# Patient Record
Sex: Female | Born: 1935
Health system: Southern US, Community
[De-identification: ages and names within clinical notes are randomized; demographics above are authoritative.]

## PROBLEM LIST (undated history)

## (undated) DIAGNOSIS — E039 Hypothyroidism, unspecified: Secondary | ICD-10-CM

## (undated) DIAGNOSIS — M810 Age-related osteoporosis without current pathological fracture: Secondary | ICD-10-CM

## (undated) DIAGNOSIS — G250 Essential tremor: Secondary | ICD-10-CM

## (undated) DIAGNOSIS — F0281 Dementia in other diseases classified elsewhere with behavioral disturbance: Secondary | ICD-10-CM

## (undated) DIAGNOSIS — I10 Essential (primary) hypertension: Secondary | ICD-10-CM

## (undated) DIAGNOSIS — F32A Depression, unspecified: Secondary | ICD-10-CM

## (undated) DIAGNOSIS — F22 Delusional disorders: Secondary | ICD-10-CM

## (undated) DIAGNOSIS — I272 Pulmonary hypertension, unspecified: Secondary | ICD-10-CM

## (undated) DIAGNOSIS — H353130 Nonexudative age-related macular degeneration, bilateral, stage unspecified: Secondary | ICD-10-CM

## (undated) DIAGNOSIS — E7800 Pure hypercholesterolemia, unspecified: Secondary | ICD-10-CM

## (undated) DIAGNOSIS — F423 Hoarding disorder: Secondary | ICD-10-CM

## (undated) DIAGNOSIS — F329 Major depressive disorder, single episode, unspecified: Secondary | ICD-10-CM

## (undated) DIAGNOSIS — M5136 Other intervertebral disc degeneration, lumbar region: Secondary | ICD-10-CM

## (undated) DIAGNOSIS — I447 Left bundle-branch block, unspecified: Secondary | ICD-10-CM

## (undated) DIAGNOSIS — K219 Gastro-esophageal reflux disease without esophagitis: Secondary | ICD-10-CM

## (undated) DIAGNOSIS — G4700 Insomnia, unspecified: Secondary | ICD-10-CM

## (undated) DIAGNOSIS — K7589 Other specified inflammatory liver diseases: Secondary | ICD-10-CM

## (undated) DIAGNOSIS — M5430 Sciatica, unspecified side: Secondary | ICD-10-CM

## (undated) DIAGNOSIS — R413 Other amnesia: Secondary | ICD-10-CM

## (undated) DIAGNOSIS — F5104 Psychophysiologic insomnia: Secondary | ICD-10-CM

## (undated) DIAGNOSIS — R5383 Other fatigue: Secondary | ICD-10-CM

## (undated) DIAGNOSIS — H04123 Dry eye syndrome of bilateral lacrimal glands: Secondary | ICD-10-CM

## (undated) DIAGNOSIS — E785 Hyperlipidemia, unspecified: Secondary | ICD-10-CM

## (undated) HISTORY — DX: Major depressive disorder, single episode, unspecified: F32.9

## (undated) HISTORY — DX: Essential (primary) hypertension: I10

## (undated) HISTORY — PX: TONSILLECTOMY: SUR1361

## (undated) HISTORY — DX: Depression, unspecified: F32.A

## (undated) HISTORY — DX: Sciatica, unspecified side: M54.30

## (undated) HISTORY — DX: Age-related osteoporosis without current pathological fracture: M81.0

## (undated) HISTORY — PX: TOTAL ABDOMINAL HYSTERECTOMY W/ BILATERAL SALPINGOOPHORECTOMY: SHX83

## (undated) HISTORY — DX: Gastro-esophageal reflux disease without esophagitis: K21.9

## (undated) HISTORY — PX: FACIAL COSMETIC SURGERY: SHX629

## (undated) HISTORY — DX: Other specified inflammatory liver diseases: K75.89

## (undated) HISTORY — DX: Other amnesia: R41.3

## (undated) HISTORY — DX: Other fatigue: R53.83

## (undated) HISTORY — PX: APPENDECTOMY: SHX54

## (undated) HISTORY — DX: Hyperlipidemia, unspecified: E78.5

## (undated) HISTORY — DX: Insomnia, unspecified: G47.00

## (undated) HISTORY — PX: CARDIAC CATHETERIZATION: SHX172

---
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
## 2011-07-29 DIAGNOSIS — E7800 Pure hypercholesterolemia, unspecified: Secondary | ICD-10-CM | POA: Insufficient documentation

## 2011-07-29 DIAGNOSIS — K219 Gastro-esophageal reflux disease without esophagitis: Secondary | ICD-10-CM | POA: Insufficient documentation

## 2011-07-29 DIAGNOSIS — I447 Left bundle-branch block, unspecified: Secondary | ICD-10-CM | POA: Insufficient documentation

## 2011-07-29 DIAGNOSIS — I10 Essential (primary) hypertension: Secondary | ICD-10-CM

## 2011-07-29 DIAGNOSIS — E039 Hypothyroidism, unspecified: Secondary | ICD-10-CM

## 2011-07-29 HISTORY — DX: Essential (primary) hypertension: I10

## 2011-07-29 HISTORY — DX: Gastro-esophageal reflux disease without esophagitis: K21.9

## 2011-07-29 HISTORY — DX: Left bundle-branch block, unspecified: I44.7

## 2011-07-29 HISTORY — DX: Hypothyroidism, unspecified: E03.9

## 2011-07-29 HISTORY — DX: Pure hypercholesterolemia, unspecified: E78.00

## 2013-06-21 LAB — HM DEXA SCAN

## 2016-07-29 DIAGNOSIS — F329 Major depressive disorder, single episode, unspecified: Secondary | ICD-10-CM | POA: Insufficient documentation

## 2016-07-29 HISTORY — DX: Major depressive disorder, single episode, unspecified: F32.9

## 2016-12-08 DIAGNOSIS — H353130 Nonexudative age-related macular degeneration, bilateral, stage unspecified: Secondary | ICD-10-CM | POA: Diagnosis not present

## 2016-12-08 DIAGNOSIS — Z9889 Other specified postprocedural states: Secondary | ICD-10-CM | POA: Diagnosis not present

## 2016-12-08 DIAGNOSIS — Z961 Presence of intraocular lens: Secondary | ICD-10-CM | POA: Diagnosis not present

## 2016-12-08 DIAGNOSIS — H35363 Drusen (degenerative) of macula, bilateral: Secondary | ICD-10-CM | POA: Diagnosis not present

## 2016-12-09 DIAGNOSIS — F321 Major depressive disorder, single episode, moderate: Secondary | ICD-10-CM | POA: Diagnosis not present

## 2016-12-09 DIAGNOSIS — R413 Other amnesia: Secondary | ICD-10-CM

## 2016-12-09 DIAGNOSIS — A09 Infectious gastroenteritis and colitis, unspecified: Secondary | ICD-10-CM | POA: Diagnosis not present

## 2016-12-09 DIAGNOSIS — Z79899 Other long term (current) drug therapy: Secondary | ICD-10-CM | POA: Diagnosis not present

## 2016-12-09 DIAGNOSIS — E039 Hypothyroidism, unspecified: Secondary | ICD-10-CM | POA: Diagnosis not present

## 2016-12-09 DIAGNOSIS — I10 Essential (primary) hypertension: Secondary | ICD-10-CM | POA: Diagnosis not present

## 2016-12-09 DIAGNOSIS — K210 Gastro-esophageal reflux disease with esophagitis: Secondary | ICD-10-CM | POA: Diagnosis not present

## 2016-12-09 HISTORY — DX: Other amnesia: R41.3

## 2017-01-06 DIAGNOSIS — I10 Essential (primary) hypertension: Secondary | ICD-10-CM | POA: Diagnosis not present

## 2017-01-06 DIAGNOSIS — K210 Gastro-esophageal reflux disease with esophagitis: Secondary | ICD-10-CM | POA: Diagnosis not present

## 2017-01-06 DIAGNOSIS — R413 Other amnesia: Secondary | ICD-10-CM | POA: Diagnosis not present

## 2017-01-06 DIAGNOSIS — Z79899 Other long term (current) drug therapy: Secondary | ICD-10-CM | POA: Diagnosis not present

## 2017-01-06 DIAGNOSIS — F321 Major depressive disorder, single episode, moderate: Secondary | ICD-10-CM | POA: Diagnosis not present

## 2017-01-06 DIAGNOSIS — E7800 Pure hypercholesterolemia, unspecified: Secondary | ICD-10-CM | POA: Diagnosis not present

## 2017-01-13 DIAGNOSIS — M2041 Other hammer toe(s) (acquired), right foot: Secondary | ICD-10-CM | POA: Diagnosis not present

## 2017-01-13 DIAGNOSIS — M2042 Other hammer toe(s) (acquired), left foot: Secondary | ICD-10-CM | POA: Diagnosis not present

## 2017-02-05 DIAGNOSIS — R001 Bradycardia, unspecified: Secondary | ICD-10-CM | POA: Diagnosis not present

## 2017-02-05 DIAGNOSIS — K219 Gastro-esophageal reflux disease without esophagitis: Secondary | ICD-10-CM | POA: Diagnosis not present

## 2017-02-05 DIAGNOSIS — R918 Other nonspecific abnormal finding of lung field: Secondary | ICD-10-CM | POA: Diagnosis not present

## 2017-02-05 DIAGNOSIS — I10 Essential (primary) hypertension: Secondary | ICD-10-CM | POA: Diagnosis not present

## 2017-02-05 DIAGNOSIS — I447 Left bundle-branch block, unspecified: Secondary | ICD-10-CM | POA: Diagnosis not present

## 2017-02-05 DIAGNOSIS — R079 Chest pain, unspecified: Secondary | ICD-10-CM | POA: Diagnosis not present

## 2017-02-05 DIAGNOSIS — E7800 Pure hypercholesterolemia, unspecified: Secondary | ICD-10-CM | POA: Diagnosis not present

## 2017-02-05 DIAGNOSIS — R0789 Other chest pain: Secondary | ICD-10-CM | POA: Diagnosis not present

## 2017-02-05 DIAGNOSIS — Z87891 Personal history of nicotine dependence: Secondary | ICD-10-CM | POA: Diagnosis not present

## 2017-02-09 DIAGNOSIS — R109 Unspecified abdominal pain: Secondary | ICD-10-CM | POA: Diagnosis not present

## 2017-02-09 DIAGNOSIS — R079 Chest pain, unspecified: Secondary | ICD-10-CM | POA: Diagnosis not present

## 2017-02-09 DIAGNOSIS — R142 Eructation: Secondary | ICD-10-CM | POA: Diagnosis not present

## 2017-03-08 DIAGNOSIS — R251 Tremor, unspecified: Secondary | ICD-10-CM | POA: Diagnosis not present

## 2017-03-08 DIAGNOSIS — N29 Other disorders of kidney and ureter in diseases classified elsewhere: Secondary | ICD-10-CM | POA: Diagnosis not present

## 2017-03-08 DIAGNOSIS — E785 Hyperlipidemia, unspecified: Secondary | ICD-10-CM | POA: Diagnosis not present

## 2017-03-08 DIAGNOSIS — I10 Essential (primary) hypertension: Secondary | ICD-10-CM | POA: Diagnosis not present

## 2017-03-08 DIAGNOSIS — R5381 Other malaise: Secondary | ICD-10-CM | POA: Diagnosis not present

## 2017-03-08 DIAGNOSIS — D519 Vitamin B12 deficiency anemia, unspecified: Secondary | ICD-10-CM | POA: Diagnosis not present

## 2017-04-14 DIAGNOSIS — M2041 Other hammer toe(s) (acquired), right foot: Secondary | ICD-10-CM | POA: Diagnosis not present

## 2017-04-14 DIAGNOSIS — M2042 Other hammer toe(s) (acquired), left foot: Secondary | ICD-10-CM | POA: Diagnosis not present

## 2017-05-04 DIAGNOSIS — R413 Other amnesia: Secondary | ICD-10-CM | POA: Diagnosis not present

## 2017-05-04 DIAGNOSIS — G250 Essential tremor: Secondary | ICD-10-CM | POA: Diagnosis not present

## 2017-05-04 DIAGNOSIS — G629 Polyneuropathy, unspecified: Secondary | ICD-10-CM | POA: Diagnosis not present

## 2017-05-04 DIAGNOSIS — R269 Unspecified abnormalities of gait and mobility: Secondary | ICD-10-CM | POA: Diagnosis not present

## 2017-06-10 DIAGNOSIS — K219 Gastro-esophageal reflux disease without esophagitis: Secondary | ICD-10-CM | POA: Diagnosis not present

## 2017-06-10 DIAGNOSIS — Z7982 Long term (current) use of aspirin: Secondary | ICD-10-CM | POA: Diagnosis not present

## 2017-06-10 DIAGNOSIS — I10 Essential (primary) hypertension: Secondary | ICD-10-CM | POA: Diagnosis not present

## 2017-06-10 DIAGNOSIS — Z79899 Other long term (current) drug therapy: Secondary | ICD-10-CM | POA: Diagnosis not present

## 2017-06-10 DIAGNOSIS — E785 Hyperlipidemia, unspecified: Secondary | ICD-10-CM | POA: Diagnosis not present

## 2017-06-10 DIAGNOSIS — R040 Epistaxis: Secondary | ICD-10-CM | POA: Diagnosis not present

## 2017-06-14 DIAGNOSIS — R413 Other amnesia: Secondary | ICD-10-CM | POA: Diagnosis not present

## 2017-06-14 DIAGNOSIS — R040 Epistaxis: Secondary | ICD-10-CM | POA: Diagnosis not present

## 2017-07-04 DIAGNOSIS — R413 Other amnesia: Secondary | ICD-10-CM | POA: Diagnosis not present

## 2017-07-04 DIAGNOSIS — R040 Epistaxis: Secondary | ICD-10-CM | POA: Diagnosis not present

## 2017-07-19 DIAGNOSIS — Z23 Encounter for immunization: Secondary | ICD-10-CM | POA: Diagnosis not present

## 2017-07-19 DIAGNOSIS — R413 Other amnesia: Secondary | ICD-10-CM | POA: Diagnosis not present

## 2017-07-19 DIAGNOSIS — K210 Gastro-esophageal reflux disease with esophagitis: Secondary | ICD-10-CM | POA: Diagnosis not present

## 2017-07-19 DIAGNOSIS — I10 Essential (primary) hypertension: Secondary | ICD-10-CM | POA: Diagnosis not present

## 2017-07-19 DIAGNOSIS — E039 Hypothyroidism, unspecified: Secondary | ICD-10-CM | POA: Diagnosis not present

## 2017-07-19 DIAGNOSIS — Z79899 Other long term (current) drug therapy: Secondary | ICD-10-CM | POA: Diagnosis not present

## 2017-07-25 DIAGNOSIS — R040 Epistaxis: Secondary | ICD-10-CM | POA: Diagnosis not present

## 2017-07-25 DIAGNOSIS — J3481 Nasal mucositis (ulcerative): Secondary | ICD-10-CM | POA: Diagnosis not present

## 2017-07-25 DIAGNOSIS — R413 Other amnesia: Secondary | ICD-10-CM | POA: Diagnosis not present

## 2017-07-25 DIAGNOSIS — H903 Sensorineural hearing loss, bilateral: Secondary | ICD-10-CM | POA: Diagnosis not present

## 2017-08-10 DIAGNOSIS — M2041 Other hammer toe(s) (acquired), right foot: Secondary | ICD-10-CM | POA: Diagnosis not present

## 2017-08-10 DIAGNOSIS — M2042 Other hammer toe(s) (acquired), left foot: Secondary | ICD-10-CM | POA: Diagnosis not present

## 2017-10-05 DIAGNOSIS — F320 Major depressive disorder, single episode, mild: Secondary | ICD-10-CM | POA: Diagnosis not present

## 2017-10-05 DIAGNOSIS — R413 Other amnesia: Secondary | ICD-10-CM | POA: Diagnosis not present

## 2017-10-05 DIAGNOSIS — K210 Gastro-esophageal reflux disease with esophagitis: Secondary | ICD-10-CM | POA: Diagnosis not present

## 2017-10-05 DIAGNOSIS — Z79899 Other long term (current) drug therapy: Secondary | ICD-10-CM | POA: Diagnosis not present

## 2018-01-25 DIAGNOSIS — F320 Major depressive disorder, single episode, mild: Secondary | ICD-10-CM | POA: Diagnosis not present

## 2018-01-25 DIAGNOSIS — E7800 Pure hypercholesterolemia, unspecified: Secondary | ICD-10-CM | POA: Diagnosis not present

## 2018-01-25 DIAGNOSIS — Z79899 Other long term (current) drug therapy: Secondary | ICD-10-CM | POA: Diagnosis not present

## 2018-01-25 DIAGNOSIS — R413 Other amnesia: Secondary | ICD-10-CM | POA: Diagnosis not present

## 2018-01-25 DIAGNOSIS — K210 Gastro-esophageal reflux disease with esophagitis: Secondary | ICD-10-CM | POA: Diagnosis not present

## 2018-03-16 DIAGNOSIS — F320 Major depressive disorder, single episode, mild: Secondary | ICD-10-CM | POA: Diagnosis not present

## 2018-03-16 DIAGNOSIS — Z79899 Other long term (current) drug therapy: Secondary | ICD-10-CM | POA: Diagnosis not present

## 2018-03-16 DIAGNOSIS — I10 Essential (primary) hypertension: Secondary | ICD-10-CM | POA: Diagnosis not present

## 2018-03-16 DIAGNOSIS — R413 Other amnesia: Secondary | ICD-10-CM | POA: Diagnosis not present

## 2018-04-17 ENCOUNTER — Encounter: Payer: Self-pay | Admitting: Internal Medicine

## 2018-04-18 ENCOUNTER — Non-Acute Institutional Stay: Payer: Medicare Other | Admitting: Internal Medicine

## 2018-04-18 ENCOUNTER — Encounter: Payer: Self-pay | Admitting: Internal Medicine

## 2018-04-18 DIAGNOSIS — F423 Hoarding disorder: Secondary | ICD-10-CM

## 2018-04-18 DIAGNOSIS — I10 Essential (primary) hypertension: Secondary | ICD-10-CM | POA: Diagnosis not present

## 2018-04-18 DIAGNOSIS — G301 Alzheimer's disease with late onset: Secondary | ICD-10-CM

## 2018-04-18 DIAGNOSIS — F5104 Psychophysiologic insomnia: Secondary | ICD-10-CM | POA: Diagnosis not present

## 2018-04-18 DIAGNOSIS — M5136 Other intervertebral disc degeneration, lumbar region: Secondary | ICD-10-CM

## 2018-04-18 DIAGNOSIS — K219 Gastro-esophageal reflux disease without esophagitis: Secondary | ICD-10-CM | POA: Diagnosis not present

## 2018-04-18 DIAGNOSIS — I447 Left bundle-branch block, unspecified: Secondary | ICD-10-CM

## 2018-04-18 DIAGNOSIS — F3341 Major depressive disorder, recurrent, in partial remission: Secondary | ICD-10-CM

## 2018-04-18 DIAGNOSIS — Z23 Encounter for immunization: Secondary | ICD-10-CM | POA: Diagnosis not present

## 2018-04-18 DIAGNOSIS — E039 Hypothyroidism, unspecified: Secondary | ICD-10-CM

## 2018-04-18 DIAGNOSIS — F0280 Dementia in other diseases classified elsewhere without behavioral disturbance: Secondary | ICD-10-CM | POA: Diagnosis not present

## 2018-04-18 DIAGNOSIS — M810 Age-related osteoporosis without current pathological fracture: Secondary | ICD-10-CM

## 2018-04-18 MED ORDER — DONEPEZIL HCL 10 MG PO TABS: 10.0000 mg | ORAL_TABLET | Freq: Every day | ORAL | 5 refills | Status: DC

## 2018-04-18 MED ORDER — CALCIUM CARBONATE-VITAMIN D 600-400 MG-UNIT PO CHEW: 1.0000 | CHEWABLE_TABLET | Freq: Three times a day (TID) | ORAL | 3 refills | Status: DC

## 2018-04-18 MED ORDER — CHOLECALCIFEROL 50 MCG (2000 UT) PO CAPS: 1.0000 | ORAL_CAPSULE | Freq: Every day | ORAL | 3 refills | Status: AC

## 2018-04-18 NOTE — Progress Notes (Signed)
Patient ID: April Stevenson, female   DOB: [birth year], 84 y.o.   MRN: [id number]  Provider:  Rexene Edison. Mariea Clonts, D.O., C.M.D. Location:  [place] Room Number: 188 Place of Service:  ALF (13)  PCP: Gayland Curry, DO Patient Care Team: Gayland Curry, DO as PCP - General (Geriatric Medicine)  Extended Emergency Contact Information Primary Emergency Contact: Su Monks Mobile Phone: [phone] Relation: Son  Code Status: full code Goals of Care: Advanced Directive information Advanced Directives 04/18/2018  Does Patient Have a Medical Advance Directive? Yes  Type of Advance Directive [place]  Does patient want to make changes to medical advance directive? No - Patient declined  Copy of [place] in Chart? Yes      Chief Complaint  Patient presents with  . New Admit To SNF    Assisted living admission    HPI: Patient is a 84 y.o. female seen today for admission to [facility] assisted living.  She'd been living in [place], [place] until her move here last week.  She last saw her previous PCP 03/16/18.+  Notes from past physician, Dr. Johnette Abraham, indicate her husband passed away early this year (before April).  Her son found their home in disarray with some hoarding going on.  He opted to move her here where he lives in [place].    Born in [place], but moved to [place] in the 1960s.  Still plays bridge and wins sometimes.  Likes tv, movies, museum exhiibits, playing with her dog, seeing her son and daughter in law and grandkids (in their 61s).    Says she's weak in housekeeping--energy gives out.  She still drives (son is winking--was driving there, but will not here).  No pains.  Has had a h/o sciatica--it occasionally rears its head.    HTN:  Has been on dyazide and tenormin.    Her husband passed away in 12/23/22.  Reports her mood has been pretty good.  She's agitated about moving here.  Has been  on fluoxetine/prozac 20 mg.  Takes trazodone '50mg'$  nightly.  Reports sleeping pretty well. Heard some noises here last night.    She uses a vaginal estrogen tablet.  She was taking estrace combo drug, covarxx/premarin since her hysterectomy and oophorectomy.    Has been on prilosec, but she reports having used it only as needed for GERD, also on zantac.  She takes a baby asa.    She had osteoporosis prior to her hormones. Since taking the estrace, her bones got stronger and she was osteopenic at last check based on T scores in notes from previous MD.  Caltrate with D.  Also on additional D3.    She listed to a nutritionist on the radio and he recommended B12 supplementation.  No known levels.   Also took multivitamin.  Was taking a benadryl at night to sleep and for allergies.   Willing to try zyrtec instead if she needs it.   She does go to bed late and gets up late.    [facility].  Is very concerned about the cost of her meds.    Memory:  Her son notes difficulty with finances with multiple liens on the house, hoarding, missed appts and pt has been accumulating her medications.   She uses an old calendar with the pages falling out.  She admits to some memory loss vs normal but mostly denies a problem.  She has stockpiled her meds.  She will not need written prescriptions for most for probably 6 mos.  She's referred to a 2018 calendar.  Does have a new one.  Scored 26/30 on mmse, failed clock.    Past Medical History:  Diagnosis Date  . Cholestatic hepatitis    2001  . Depression   . Fatigue   . GERD (gastroesophageal reflux disease)   . Hyperlipidemia   . Hypertension   . Insomnia   . Memory loss   . Osteoporosis   . Sciatica    Past Surgical History:  Procedure Laterality Date  . APPENDECTOMY    . CARDIAC CATHETERIZATION    . FACIAL COSMETIC SURGERY    . TONSILLECTOMY    . TOTAL ABDOMINAL HYSTERECTOMY W/ BILATERAL SALPINGOOPHORECTOMY      reports that she  has quit smoking. Her smoking use included cigarettes. She has a 10.00 pack-year smoking history. She has never used smokeless tobacco. She reports that she drinks about 0.6 oz of alcohol per week. She reports that she does not use drugs. Social History   Socioeconomic History  . Marital status: Widowed    Spouse name: Mikki Santee  . Number of children: 1  . Years of education: some college, took courses as adult also  . Highest education level: Some college, no degree  Occupational History  . Occupation: model    Comment: Location manager  . Occupation: singer  Social Needs  . Financial resource strain: Not hard at all  . Food insecurity:    Worry: Not on file    Inability: Not on file  . Transportation needs:    Medical: Not on file    Non-medical: Not on file  Tobacco Use  . Smoking status: Former Smoker    Packs/day: 0.50    Years: 20.00    Pack years: 10.00    Types: Cigarettes  . Smokeless tobacco: Never Used  Substance and Sexual Activity  . Alcohol use: Yes    Alcohol/week: 0.6 oz    Types: 1 Shots of liquor per week    Comment: nightly most nights  . Drug use: Never  . Sexual activity: Not Currently    Partners: Male  Lifestyle  . Physical activity:    Days per week: 0 days    Minutes per session: 0 min  . Stress: Not on file  Relationships  . Social connections:    Talks on phone: Not on file    Gets together: Not on file    Attends religious service: Not on file    Active member of club or organization: Not on file    Attends meetings of clubs or organizations: Not on file    Relationship status: Not on file  . Intimate partner violence:    Fear of current or ex partner: Not on file    Emotionally abused: Not on file    Physically abused: Not on file    Forced sexual activity: Not on file  Other Topics Concern  . Not on file  Social History Narrative   Was married three times and outlived all three husbands.    Functional Status Survey: Is the patient  deaf or have difficulty hearing?: Yes(mild) Does the patient have difficulty seeing, even when wearing glasses/contacts?: No(wears reading glasses) Does the patient have difficulty concentrating, remembering, or making decisions?: Yes Does the patient have difficulty walking or climbing stairs?: No Does the patient have difficulty dressing or bathing?: No Does the patient have difficulty doing errands alone  such as visiting a doctor's office or shopping?: Yes  Family History  Problem Relation Age of Onset  . Heart attack Father     Health Maintenance  Topic Date Due  . PNA vac Low Risk Adult (2 of 2 - PPSV23) 08/21/2010  . TETANUS/TDAP  08/31/2016  . INFLUENZA VACCINE  05/17/2018  . DEXA SCAN  Completed    Allergies  Allergen Reactions  . Garlic   . [place]   . Lanolin   . Penicillins     ? rash  . Statins     myalgia    Outpatient Encounter Medications as of 04/18/2018  Medication Sig  . atenolol (TENORMIN) 50 MG tablet Take 50 mg by mouth daily.   . Estradiol 10 MCG TABS vaginal tablet Insert Vaginally five times a week  . FLUoxetine (PROZAC) 20 MG capsule TAKE 1 CAPSULE DAILY  . omeprazole (PRILOSEC) 20 MG capsule Take 20 mg by mouth daily.   . ranitidine (ZANTAC) 150 MG tablet TAKE 1 TABLET DAILY  . traZODone (DESYREL) 50 MG tablet Take 50 mg by mouth at bedtime.   . triamterene-hydrochlorothiazide (DYAZIDE) 37.5-25 MG capsule Take 1 capsule by mouth daily.   . Calcium Carbonate-Vitamin D 600-400 MG-UNIT chew tablet Chew 1 tablet by mouth 3 (three) times daily with meals.  . Cholecalciferol (D3 HIGH POTENCY) 2000 units CAPS Take 1 capsule (2,000 Units total) by mouth daily.  Marland Kitchen donepezil (ARICEPT) 10 MG tablet Take 1 tablet (10 mg total) by mouth at bedtime.   No facility-administered encounter medications on file as of 04/18/2018.     Review of Systems  Constitutional: Positive for activity change and fatigue. Negative for appetite change, chills, fever and  unexpected weight change.       Harder to lose weight  HENT: Positive for hearing loss and rhinorrhea. Negative for trouble swallowing.        Some mild hearing loss  Eyes: Negative for visual disturbance.       Uses reading glasses--last optometry  Respiratory: Positive for shortness of breath. Negative for chest tightness.        If she moves too rapidly, but not usually aware of it  Cardiovascular: Negative for chest pain, palpitations and leg swelling.       Used to get edema of ankles  Gastrointestinal: Positive for constipation (intermittent). Negative for abdominal pain.  Genitourinary: Positive for urgency. Negative for dysuria and frequency.       Very rarely incontinent if she waits too long  Musculoskeletal: Positive for back pain. Negative for gait problem.       No falls but one that her son got report of after bridge involving a chair on carpet  Skin: Negative for color change.  Neurological: Negative for dizziness and weakness.       Sciatica flared up after 2.5 hour tour; sometimes feels dizzy in the bed when she is not sleeping well  Hematological: Does not bruise/bleed easily.  Psychiatric/Behavioral: Positive for confusion and sleep disturbance.       Memory not as sharp as 10 years ago; her son notes slippage with her memory with recent things; struggling with appts, money and medications; a lot of stress with her husband's death--hospice Sep 20, 2023 to December 20, 2022; prior husband had been ill also    Vitals:   04/18/18 0900  BP: (!) 156/70  Pulse: (!) 54  Resp: 18  Temp: 97.6 F (36.4 C)  TempSrc: Oral  SpO2: 95%   There is no height  or weight on file to calculate BMI. Physical Exam  Constitutional: She appears well-developed and well-nourished. No distress.  HENT:  Head: Normocephalic and atraumatic.  Right Ear: External ear normal.  Left Ear: External ear normal.  Nose: Nose normal.  Mouth/Throat: Oropharynx is clear and moist.  Eyes: Conjunctivae are normal.  Neck:  Neck supple. No JVD present. No thyromegaly present.  Cardiovascular: Normal rate, regular rhythm, normal heart sounds and intact distal pulses.  Pulmonary/Chest: Effort normal and breath sounds normal. No respiratory distress. She has no wheezes.  Abdominal: Soft. Bowel sounds are normal. She exhibits no distension. There is no tenderness.  Musculoskeletal: Normal range of motion. She exhibits no tenderness.  Walks normally without assistive device (did "runway walk" for me)  Lymphadenopathy:    She has no cervical adenopathy.  Neurological: She is alert. No cranial nerve deficit or sensory deficit. She exhibits normal muscle tone. Coordination normal.  Oriented to person and place (but first thought she was in a hotel when she arrived), not time (also missed on mmse); poor historian (son sitting behind her to the side with his wife and they were indicating with winks or hand signals if her history was accurate)  Skin: Skin is warm and dry. Capillary refill takes less than 2 seconds. She is not diaphoretic.  Psychiatric: She has a normal mood and affect.  Was very pleasant and talkative    Labs reviewed: Not abstracted from medical records--are in care everywhere, needs up to date labs anyway  Imaging and Procedures obtained prior to SNF admission: Patient was never admitted. Bone density from 2014 with T scores in osteopenic range (below)  Assessment/Plan 1. Late onset Alzheimer's disease without behavioral disturbance -appears to be the case -cont aricept therapy -provide medications and support -encourage activities -may need namenda added -r/o any reversible causes that could be worsening her cognition--tsh, b12, lytes, h/h -may need CT brain -on B12, but not clear if she was deficient (says she just took it) so check level  2. Senile osteoporosis - was on estrogen for years and bone density had improved but risks of estrogen/progesterone at 89 outweigh benefits so d/c  -DXA:  R/L FEMUR,SPINE:L HIP-1.6 R HIP-1.1 L 11.3 in 2014 -will f/u bone density in near future to see where she is - Calcium Carbonate-Vitamin D 600-400 MG-UNIT chew tablet; Chew 1 tablet by mouth 3 (three) times daily with meals.  Dispense: 60 tablet; Refill: 3 - Cholecalciferol (D3 HIGH POTENCY) 2000 units CAPS; Take 1 capsule (2,000 Units total) by mouth daily.  Dispense: 30 each; Refill: 3  3. Recurrent major depressive disorder, in partial remission ([place]) -has been on prozac longstanding and appears she was taking it and the trazodone (for sleep) so continue these--will need during adjustment to AL environment and adjustment to [place] vs [place] (and [place] before that)  4. Hiatal hernia with Gastroesophageal reflux disease, esophagitis presence not specified -cont zantac and prilosec, may taper off in time  5. Essential hypertension -had not been taking these meds regularly based on extensive stash of them in disorganized fashion -resume her atenolol and dyazide and monitor bp to see if both needed -encourage hydration  6. Hypothyroidism, unspecified type -on record, but she's not on levothyroxine -f/u TSH--could explain cognitive decline if not being treated  7. Hoarding behavior -seems it was related to her grieving loss of her husband and depression, monitor here   8. Psychophysiological insomnia -cont trazodone, avoid benadryl and other classic sedative hypnotics due to her dementia  9.  Need for shingrix--order written to give when received from pharmacy  10.  Lumbar DDD:  Has h/o sciatica off and on related to this, reviewed prior xrays  11.  LBBB:  Seen on prior EKGs  Also due again for tdap (over 10 years)  Family/ staff Communication:  Met with pt's son and daughter in law, discussed with AL nurse manager and DON  Labs/tests ordered:  Cbc with diff, cmp, flp, tsh, b12/folate May need CT brain, also  F/u in clinic in 2 mos  Maxden Naji L. Jontavius Rabalais,  D.O. [facility] [address], [place] [address] Cell Phone (Mon-Fri 8am-5pm):  [phone] On Call:  [phone] & follow prompts after 5pm & weekends Office Phone:  [phone] Office Fax:  [phone]

## 2018-04-19 DIAGNOSIS — D649 Anemia, unspecified: Secondary | ICD-10-CM | POA: Diagnosis not present

## 2018-04-19 DIAGNOSIS — Z79899 Other long term (current) drug therapy: Secondary | ICD-10-CM | POA: Diagnosis not present

## 2018-04-19 DIAGNOSIS — E039 Hypothyroidism, unspecified: Secondary | ICD-10-CM | POA: Diagnosis not present

## 2018-04-19 DIAGNOSIS — E785 Hyperlipidemia, unspecified: Secondary | ICD-10-CM | POA: Diagnosis not present

## 2018-04-19 LAB — LIPID PANEL
Cholesterol: 244 — AB (ref 0–200)
HDL: 54 (ref 35–70)
LDL Cholesterol: 168
LDl/HDL Ratio: 4.5
Triglycerides: 110 (ref 40–160)

## 2018-04-19 LAB — TSH: TSH: 5.89 (ref 0.41–5.90)

## 2018-04-19 LAB — VITAMIN B12: Vitamin B-12: 597

## 2018-04-19 LAB — BASIC METABOLIC PANEL
BUN: 26 — AB (ref 4–21)
Creatinine: 1.1 (ref 0.5–1.1)
Glucose: 82
Potassium: 3.5 (ref 3.4–5.3)
Sodium: 144 (ref 137–147)

## 2018-04-19 LAB — HEPATIC FUNCTION PANEL
ALT: 13 (ref 7–35)
AST: 13 (ref 13–35)
Alkaline Phosphatase: 77 (ref 25–125)
Bilirubin, Direct: 0.2 (ref 0.01–0.4)
Bilirubin, Total: 0.2

## 2018-04-19 LAB — CBC AND DIFFERENTIAL
HCT: 37 (ref 36–46)
Hemoglobin: 12.1 (ref 12.0–16.0)
Platelets: 182 (ref 150–399)
WBC: 8.1

## 2018-04-23 ENCOUNTER — Encounter: Payer: Self-pay | Admitting: Internal Medicine

## 2018-04-23 DIAGNOSIS — F02818 Dementia in other diseases classified elsewhere, unspecified severity, with other behavioral disturbance: Secondary | ICD-10-CM | POA: Insufficient documentation

## 2018-04-23 DIAGNOSIS — F5104 Psychophysiologic insomnia: Secondary | ICD-10-CM

## 2018-04-23 DIAGNOSIS — F423 Hoarding disorder: Secondary | ICD-10-CM

## 2018-04-23 DIAGNOSIS — G301 Alzheimer's disease with late onset: Secondary | ICD-10-CM

## 2018-04-23 DIAGNOSIS — M810 Age-related osteoporosis without current pathological fracture: Secondary | ICD-10-CM

## 2018-04-23 DIAGNOSIS — F0281 Dementia in other diseases classified elsewhere with behavioral disturbance: Secondary | ICD-10-CM

## 2018-04-23 DIAGNOSIS — M5136 Other intervertebral disc degeneration, lumbar region: Secondary | ICD-10-CM

## 2018-04-23 HISTORY — DX: Other intervertebral disc degeneration, lumbar region: M51.36

## 2018-04-23 HISTORY — DX: Psychophysiologic insomnia: F51.04

## 2018-04-23 HISTORY — DX: Dementia in other diseases classified elsewhere, unspecified severity, with other behavioral disturbance: F02.818

## 2018-04-23 HISTORY — DX: Age-related osteoporosis without current pathological fracture: M81.0

## 2018-04-23 HISTORY — DX: Hoarding disorder: F42.3

## 2018-04-23 HISTORY — DX: Dementia in other diseases classified elsewhere with behavioral disturbance: F02.81

## 2018-04-24 ENCOUNTER — Encounter: Payer: Self-pay | Admitting: Internal Medicine

## 2018-05-02 ENCOUNTER — Non-Acute Institutional Stay: Payer: Medicare Other

## 2018-05-02 DIAGNOSIS — Z0000 Encounter for general adult medical examination without abnormal findings: Secondary | ICD-10-CM

## 2018-05-02 NOTE — Patient Instructions (Signed)
April Stevenson , Thank you for taking time to come for your Medicare Wellness Visit. I appreciate your ongoing commitment to your health goals. Please review the following plan we discussed and let me know if I can assist you in the future.   Screening recommendations/referrals: Colonoscopy excluded, over age 82 Mammogram excluded, over age 44 Bone Density up to date Recommended yearly ophthalmology/optometry visit for glaucoma screening and checkup Recommended yearly dental visit for hygiene and checkup  Vaccinations: Influenza vaccine up to date, due 2019 fall season Pneumococcal vaccine up to date, completed Tdap vaccine due, ordered Shingles vaccine not in past records    Advanced directives: in chart  Conditions/risks identified: none  Next appointment: Dr. Mariea Clonts makes rounds   Preventive Care 22 Years and Older, Female Preventive care refers to lifestyle choices and visits with your health care provider that can promote health and wellness. What does preventive care include?  A yearly physical exam. This is also called an annual well check.  Dental exams once or twice a year.  Routine eye exams. Ask your health care provider how often you should have your eyes checked.  Personal lifestyle choices, including:  Daily care of your teeth and gums.  Regular physical activity.  Eating a healthy diet.  Avoiding tobacco and drug use.  Limiting alcohol use.  Practicing safe sex.  Taking low-dose aspirin every day.  Taking vitamin and mineral supplements as recommended by your health care provider. What happens during an annual well check? The services and screenings done by your health care provider during your annual well check will depend on your age, overall health, lifestyle risk factors, and family history of disease. Counseling  Your health care provider may ask you questions about your:  Alcohol use.  Tobacco use.  Drug use.  Emotional  well-being.  Home and relationship well-being.  Sexual activity.  Eating habits.  History of falls.  Memory and ability to understand (cognition).  Work and work Statistician.  Reproductive health. Screening  You may have the following tests or measurements:  Height, weight, and BMI.  Blood pressure.  Lipid and cholesterol levels. These may be checked every 5 years, or more frequently if you are over 82 years old.  Skin check.  Lung cancer screening. You may have this screening every year starting at age 81 if you have a 30-pack-year history of smoking and currently smoke or have quit within the past 15 years.  Fecal occult blood test (FOBT) of the stool. You may have this test every year starting at age 90.  Flexible sigmoidoscopy or colonoscopy. You may have a sigmoidoscopy every 5 years or a colonoscopy every 10 years starting at age 65.  Hepatitis C blood test.  Hepatitis B blood test.  Sexually transmitted disease (STD) testing.  Diabetes screening. This is done by checking your blood sugar (glucose) after you have not eaten for a while (fasting). You may have this done every 1-3 years.  Bone density scan. This is done to screen for osteoporosis. You may have this done starting at age 1.  Mammogram. This may be done every 1-2 years. Talk to your health care provider about how often you should have regular mammograms. Talk with your health care provider about your test results, treatment options, and if necessary, the need for more tests. Vaccines  Your health care provider may recommend certain vaccines, such as:  Influenza vaccine. This is recommended every year.  Tetanus, diphtheria, and acellular pertussis (Tdap, Td) vaccine. You  may need a Td booster every 10 years.  Zoster vaccine. You may need this after age 27.  Pneumococcal 13-valent conjugate (PCV13) vaccine. One dose is recommended after age 30.  Pneumococcal polysaccharide (PPSV23) vaccine. One  dose is recommended after age 65. Talk to your health care provider about which screenings and vaccines you need and how often you need them. This information is not intended to replace advice given to you by your health care provider. Make sure you discuss any questions you have with your health care provider. Document Released: 10/30/2015 Document Revised: 06/22/2016 Document Reviewed: 08/04/2015 Elsevier Interactive Patient Education  2017 [place] Prevention in the Home Falls can cause injuries. They can happen to people of all ages. There are many things you can do to make your home safe and to help prevent falls. What can I do on the outside of my home?  Regularly fix the edges of walkways and driveways and fix any cracks.  Remove anything that might make you trip as you walk through a door, such as a raised step or threshold.  Trim any bushes or trees on the path to your home.  Use bright outdoor lighting.  Clear any walking paths of anything that might make someone trip, such as rocks or tools.  Regularly check to see if handrails are loose or broken. Make sure that both sides of any steps have handrails.  Any raised decks and porches should have guardrails on the edges.  Have any leaves, snow, or ice cleared regularly.  Use sand or salt on walking paths during winter.  Clean up any spills in your garage right away. This includes oil or grease spills. What can I do in the bathroom?  Use night lights.  Install grab bars by the toilet and in the tub and shower. Do not use towel bars as grab bars.  Use non-skid mats or decals in the tub or shower.  If you need to sit down in the shower, use a plastic, non-slip stool.  Keep the floor dry. Clean up any water that spills on the floor as soon as it happens.  Remove soap buildup in the tub or shower regularly.  Attach bath mats securely with double-sided non-slip rug tape.  Do not have throw rugs and other  things on the floor that can make you trip. What can I do in the bedroom?  Use night lights.  Make sure that you have a light by your bed that is easy to reach.  Do not use any sheets or blankets that are too big for your bed. They should not hang down onto the floor.  Have a firm chair that has side arms. You can use this for support while you get dressed.  Do not have throw rugs and other things on the floor that can make you trip. What can I do in the kitchen?  Clean up any spills right away.  Avoid walking on wet floors.  Keep items that you use a lot in easy-to-reach places.  If you need to reach something above you, use a strong step stool that has a grab bar.  Keep electrical cords out of the way.  Do not use floor polish or wax that makes floors slippery. If you must use wax, use non-skid floor wax.  Do not have throw rugs and other things on the floor that can make you trip. What can I do with my stairs?  Do not leave any items  on the stairs.  Make sure that there are handrails on both sides of the stairs and use them. Fix handrails that are broken or loose. Make sure that handrails are as long as the stairways.  Check any carpeting to make sure that it is firmly attached to the stairs. Fix any carpet that is loose or worn.  Avoid having throw rugs at the top or bottom of the stairs. If you do have throw rugs, attach them to the floor with carpet tape.  Make sure that you have a light switch at the top of the stairs and the bottom of the stairs. If you do not have them, ask someone to add them for you. What else can I do to help prevent falls?  Wear shoes that:  Do not have high heels.  Have rubber bottoms.  Are comfortable and fit you well.  Are closed at the toe. Do not wear sandals.  If you use a stepladder:  Make sure that it is fully opened. Do not climb a closed stepladder.  Make sure that both sides of the stepladder are locked into place.  Ask  someone to hold it for you, if possible.  Clearly mark and make sure that you can see:  Any grab bars or handrails.  First and last steps.  Where the edge of each step is.  Use tools that help you move around (mobility aids) if they are needed. These include:  Canes.  Walkers.  Scooters.  Crutches.  Turn on the lights when you go into a dark area. Replace any light bulbs as soon as they burn out.  Set up your furniture so you have a clear path. Avoid moving your furniture around.  If any of your floors are uneven, fix them.  If there are any pets around you, be aware of where they are.  Review your medicines with your doctor. Some medicines can make you feel dizzy. This can increase your chance of falling. Ask your doctor what other things that you can do to help prevent falls. This information is not intended to replace advice given to you by your health care provider. Make sure you discuss any questions you have with your health care provider. Document Released: 07/30/2009 Document Revised: 03/10/2016 Document Reviewed: 11/07/2014 Elsevier Interactive Patient Education  2017 [employer].

## 2018-05-02 NOTE — Progress Notes (Signed)
Subjective:   April Stevenson is a 84 y.o. female who presents for Medicare Annual (Subsequent) preventive examination at [facility]-05/2013    Objective:     Vitals: BP 140/68 (BP Location: Left Arm, Patient Position: Sitting)   Pulse (!) 48   Temp 98.2 F (36.8 C) (Oral)   Ht 5\' 9"  (1.753 m)   Wt 154 lb 3.2 oz (69.9 kg)   BMI 22.77 kg/m   Body mass index is 22.77 kg/m.  Advanced Directives 05/02/2018 04/18/2018  Does Patient Have a Medical Advance Directive? Yes Yes  Type of Advance Directive Healthcare Power of [place]  Does patient want to make changes to medical advance directive? No - Patient declined No - Patient declined  Copy of [place] in Chart? Yes Yes    Tobacco Social History   Tobacco Use  Smoking Status Former Smoker  . Packs/day: 0.50  . Years: 20.00  . Pack years: 10.00  . Types: Cigarettes  Smokeless Tobacco Never Used     Counseling given: Not Answered   Clinical Intake:  Pre-visit preparation completed: No  Pain : No/denies pain     Nutritional Risks: None Diabetes: No  How often do you need to have someone help you when you read instructions, pamphlets, or other written materials from your doctor or pharmacy?: 2 - Rarely What is the last grade level you completed in school?: College  Interpreter Needed?: No  Information entered by :: April Dense, RN  Past Medical History:  Diagnosis Date  . Cholestatic hepatitis    2001  . Depression   . Fatigue   . GERD (gastroesophageal reflux disease)   . Hyperlipidemia   . Hypertension   . Insomnia   . Memory loss   . Osteoporosis   . Sciatica    Past Surgical History:  Procedure Laterality Date  . APPENDECTOMY    . CARDIAC CATHETERIZATION    . FACIAL COSMETIC SURGERY    . TONSILLECTOMY    . TOTAL ABDOMINAL HYSTERECTOMY W/ BILATERAL SALPINGOOPHORECTOMY     Family History  Problem Relation Age  of Onset  . Heart attack Father    Social History   Socioeconomic History  . Marital status: Widowed    Spouse name: April Stevenson  . Number of children: 1  . Years of education: some college, took courses as adult also  . Highest education level: Some college, no degree  Occupational History  . Occupation: model    Comment: Location manager  . Occupation: singer  Social Needs  . Financial resource strain: Not hard at all  . Food insecurity:    Worry: Never true    Inability: Never true  . Transportation needs:    Medical: No    Non-medical: No  Tobacco Use  . Smoking status: Former Smoker    Packs/day: 0.50    Years: 20.00    Pack years: 10.00    Types: Cigarettes  . Smokeless tobacco: Never Used  Substance and Sexual Activity  . Alcohol use: Not Currently    Alcohol/week: 0.6 oz    Types: 1 Shots of liquor per week    Comment: nightly most nights  . Drug use: Never  . Sexual activity: Not Currently    Partners: Male  Lifestyle  . Physical activity:    Days per week: 0 days    Minutes per session: 0 min  . Stress: Only a little  Relationships  .  Social connections:    Talks on phone: Three times a week    Gets together: Once a week    Attends religious service: Never    Active member of club or organization: No    Attends meetings of clubs or organizations: Never    Relationship status: Widowed  Other Topics Concern  . Not on file  Social History Narrative   Was married three times and outlived all three husbands.    Outpatient Encounter Medications as of 05/02/2018  Medication Sig  . atenolol (TENORMIN) 50 MG tablet Take 50 mg by mouth daily.   . Calcium Carbonate-Vitamin D 600-400 MG-UNIT chew tablet Chew 1 tablet by mouth 3 (three) times daily with meals.  . Cholecalciferol (D3 HIGH POTENCY) 2000 units CAPS Take 1 capsule (2,000 Units total) by mouth daily.  Marland Kitchen donepezil (ARICEPT) 10 MG tablet Take 1 tablet (10 mg total) by mouth at bedtime.  . Estradiol 10 MCG  TABS vaginal tablet Insert Vaginally five times a week  . FLUoxetine (PROZAC) 20 MG capsule TAKE 1 CAPSULE DAILY  . omeprazole (PRILOSEC) 20 MG capsule Take 20 mg by mouth daily.   . ranitidine (ZANTAC) 150 MG tablet TAKE 1 TABLET DAILY  . traZODone (DESYREL) 50 MG tablet Take 50 mg by mouth at bedtime.   . triamterene-hydrochlorothiazide (DYAZIDE) 37.5-25 MG capsule Take 1 capsule by mouth daily.    No facility-administered encounter medications on file as of 05/02/2018.     Activities of Daily Living In your present state of health, do you have any difficulty performing the following activities: 05/02/2018 04/23/2018  Hearing? Y Y  Comment mild mild  Vision? N N  Comment - wears reading glasses  Difficulty concentrating or making decisions? April Stevenson  Walking or climbing stairs? N N  Dressing or bathing? N N  Doing errands, shopping? April Stevenson  Preparing Food and eating ? N N  Using the Toilet? N N  In the past six months, have you accidently leaked urine? N Y  Do you have problems with loss of bowel control? N N  Managing your Medications? Y Y  Managing your Finances? April Stevenson  Housekeeping or managing your Housekeeping? April Stevenson    Patient Care Team: April Curry, DO as PCP - General (Geriatric Medicine)    Assessment:   This is a routine wellness examination for April Stevenson.  Exercise Activities and Dietary recommendations Current Exercise Habits: The patient does not participate in regular exercise at present, Exercise limited by: None identified  Goals    None      Fall Risk Fall Risk  05/02/2018  Falls in the past year? No   Is the patient's home free of loose throw rugs in walkways, pet beds, electrical cords, etc?   yes      Grab bars in the bathroom? yes      Handrails on the stairs?   yes      Adequate lighting?   yes  Depression Screen PHQ 2/9 Scores 05/02/2018  PHQ - 2 Score 1     Cognitive Function MMSE - Mini Mental State Exam 04/12/2018  Orientation to time 1    Orientation to Place 5  Registration 3  Attention/ Calculation 5  Recall 3  Language- name 2 objects 2  Language- repeat 1  Language- follow 3 step command 3  Language- read & follow direction 1  Write a sentence 1  Copy design 1  Total score 26  Immunization History  Administered Date(s) Administered  . Influenza Split 08/14/2009, 06/29/2010, 07/19/2011, 07/29/2016  . Influenza, High Dose Seasonal PF 08/08/2012, 07/26/2013, 09/17/2014, 07/31/2015  . Influenza, Quadrivalent, Recombinant, Inj, Pf 07/19/2017  . Influenza,trivalent, recombinat, inj, PF 07/26/2016  . Pneumococcal Conjugate-13 08/21/2009  . Pneumococcal Polysaccharide-23 04/20/2018  . Td 10/13/1997  . Tdap 08/31/2006  . Zoster 06/15/2011    Qualifies for Shingles Vaccine? Not in past records  Screening Tests Health Maintenance  Topic Date Due  . TETANUS/TDAP  08/31/2016  . INFLUENZA VACCINE  05/17/2018  . DEXA SCAN  Completed  . PNA vac Low Risk Adult  Completed    Cancer Screenings: Lung: Low Dose CT Chest recommended if Age 107-80 years, 30 pack-year currently smoking OR have quit w/in 15years. Patient does not qualify. Breast:  Up to date on Mammogram? Yes   Up to date of Bone Density/Dexa? Yes Colorectal: up to date  Additional Screenings:  Hepatitis C Screening: declined TDAP due: ordered     Plan:    I have personally reviewed and addressed the Medicare Annual Wellness questionnaire and have noted the following in the patient's chart:  A. Medical and social history B. Use of alcohol, tobacco or illicit drugs  C. Current medications and supplements D. Functional ability and status E.  Nutritional status F.  Physical activity G. Advance directives H. List of other physicians I.  Hospitalizations, surgeries, and ER visits in previous 12 months J.  [place] to include hearing, vision, cognitive, depression L. Referrals and appointments - none  In addition, I have  reviewed and discussed with patient certain preventive protocols, quality metrics, and best practice recommendations. A written personalized care plan for preventive services as well as general preventive health recommendations were provided to patient.  See attached scanned questionnaire for additional information.   Signed,   April Dense, RN Nurse Health Advisor  Patient Concerns: None

## 2018-05-03 DIAGNOSIS — R41841 Cognitive communication deficit: Secondary | ICD-10-CM | POA: Diagnosis not present

## 2018-05-03 DIAGNOSIS — G301 Alzheimer's disease with late onset: Secondary | ICD-10-CM | POA: Diagnosis not present

## 2018-05-04 DIAGNOSIS — R41841 Cognitive communication deficit: Secondary | ICD-10-CM | POA: Diagnosis not present

## 2018-05-04 DIAGNOSIS — G301 Alzheimer's disease with late onset: Secondary | ICD-10-CM | POA: Diagnosis not present

## 2018-05-07 DIAGNOSIS — G301 Alzheimer's disease with late onset: Secondary | ICD-10-CM | POA: Diagnosis not present

## 2018-05-07 DIAGNOSIS — R41841 Cognitive communication deficit: Secondary | ICD-10-CM | POA: Diagnosis not present

## 2018-05-10 DIAGNOSIS — G301 Alzheimer's disease with late onset: Secondary | ICD-10-CM | POA: Diagnosis not present

## 2018-05-10 DIAGNOSIS — R41841 Cognitive communication deficit: Secondary | ICD-10-CM | POA: Diagnosis not present

## 2018-05-14 DIAGNOSIS — R41841 Cognitive communication deficit: Secondary | ICD-10-CM | POA: Diagnosis not present

## 2018-05-14 DIAGNOSIS — G301 Alzheimer's disease with late onset: Secondary | ICD-10-CM | POA: Diagnosis not present

## 2018-05-15 DIAGNOSIS — R41841 Cognitive communication deficit: Secondary | ICD-10-CM | POA: Diagnosis not present

## 2018-05-15 DIAGNOSIS — G301 Alzheimer's disease with late onset: Secondary | ICD-10-CM | POA: Diagnosis not present

## 2018-05-16 DIAGNOSIS — G301 Alzheimer's disease with late onset: Secondary | ICD-10-CM | POA: Diagnosis not present

## 2018-05-16 DIAGNOSIS — R41841 Cognitive communication deficit: Secondary | ICD-10-CM | POA: Diagnosis not present

## 2018-05-17 DIAGNOSIS — G301 Alzheimer's disease with late onset: Secondary | ICD-10-CM | POA: Diagnosis not present

## 2018-05-17 DIAGNOSIS — R41841 Cognitive communication deficit: Secondary | ICD-10-CM | POA: Diagnosis not present

## 2018-05-18 DIAGNOSIS — R41841 Cognitive communication deficit: Secondary | ICD-10-CM | POA: Diagnosis not present

## 2018-05-18 DIAGNOSIS — G301 Alzheimer's disease with late onset: Secondary | ICD-10-CM | POA: Diagnosis not present

## 2018-05-21 DIAGNOSIS — R41841 Cognitive communication deficit: Secondary | ICD-10-CM | POA: Diagnosis not present

## 2018-05-21 DIAGNOSIS — G301 Alzheimer's disease with late onset: Secondary | ICD-10-CM | POA: Diagnosis not present

## 2018-05-24 DIAGNOSIS — G301 Alzheimer's disease with late onset: Secondary | ICD-10-CM | POA: Diagnosis not present

## 2018-05-24 DIAGNOSIS — R41841 Cognitive communication deficit: Secondary | ICD-10-CM | POA: Diagnosis not present

## 2018-05-25 DIAGNOSIS — R41841 Cognitive communication deficit: Secondary | ICD-10-CM | POA: Diagnosis not present

## 2018-05-25 DIAGNOSIS — G301 Alzheimer's disease with late onset: Secondary | ICD-10-CM | POA: Diagnosis not present

## 2018-05-29 DIAGNOSIS — R41841 Cognitive communication deficit: Secondary | ICD-10-CM | POA: Diagnosis not present

## 2018-05-29 DIAGNOSIS — G301 Alzheimer's disease with late onset: Secondary | ICD-10-CM | POA: Diagnosis not present

## 2018-05-30 DIAGNOSIS — G301 Alzheimer's disease with late onset: Secondary | ICD-10-CM | POA: Diagnosis not present

## 2018-05-30 DIAGNOSIS — R41841 Cognitive communication deficit: Secondary | ICD-10-CM | POA: Diagnosis not present

## 2018-06-07 DIAGNOSIS — R41841 Cognitive communication deficit: Secondary | ICD-10-CM | POA: Diagnosis not present

## 2018-06-07 DIAGNOSIS — G301 Alzheimer's disease with late onset: Secondary | ICD-10-CM | POA: Diagnosis not present

## 2018-06-08 DIAGNOSIS — G301 Alzheimer's disease with late onset: Secondary | ICD-10-CM | POA: Diagnosis not present

## 2018-06-08 DIAGNOSIS — R41841 Cognitive communication deficit: Secondary | ICD-10-CM | POA: Diagnosis not present

## 2018-07-04 DIAGNOSIS — H353132 Nonexudative age-related macular degeneration, bilateral, intermediate dry stage: Secondary | ICD-10-CM | POA: Diagnosis not present

## 2018-07-06 ENCOUNTER — Telehealth: Payer: Self-pay
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
## 2018-07-09 DIAGNOSIS — B351 Tinea unguium: Secondary | ICD-10-CM | POA: Diagnosis not present

## 2018-07-12 ENCOUNTER — Encounter: Payer: Self-pay | Admitting: Internal Medicine

## 2018-07-12 NOTE — Telephone Encounter (Signed)
Letter completed.  Dee, please see routed letter and message to call [name] about appt next week.

## 2018-07-13 NOTE — Telephone Encounter (Signed)
Letter mailed to April Stevenson

## 2018-07-18 ENCOUNTER — Encounter: Payer: Self-pay | Admitting: Internal Medicine

## 2018-07-18 ENCOUNTER — Non-Acute Institutional Stay: Payer: Medicare Other | Admitting: Internal Medicine

## 2018-07-18 VITALS — BP 122/60 | HR 59 | Temp 98.6°F | Ht 69.0 in | Wt 160.0 lb

## 2018-07-18 DIAGNOSIS — G250 Essential tremor: Secondary | ICD-10-CM | POA: Diagnosis not present

## 2018-07-18 DIAGNOSIS — F5104 Psychophysiologic insomnia: Secondary | ICD-10-CM

## 2018-07-18 DIAGNOSIS — H04123 Dry eye syndrome of bilateral lacrimal glands: Secondary | ICD-10-CM | POA: Diagnosis not present

## 2018-07-18 DIAGNOSIS — M810 Age-related osteoporosis without current pathological fracture: Secondary | ICD-10-CM

## 2018-07-18 DIAGNOSIS — E039 Hypothyroidism, unspecified: Secondary | ICD-10-CM | POA: Diagnosis not present

## 2018-07-18 DIAGNOSIS — K219 Gastro-esophageal reflux disease without esophagitis: Secondary | ICD-10-CM

## 2018-07-18 DIAGNOSIS — F3341 Major depressive disorder, recurrent, in partial remission: Secondary | ICD-10-CM | POA: Diagnosis not present

## 2018-07-18 DIAGNOSIS — G301 Alzheimer's disease with late onset: Secondary | ICD-10-CM

## 2018-07-18 DIAGNOSIS — I10 Essential (primary) hypertension: Secondary | ICD-10-CM

## 2018-07-18 DIAGNOSIS — F0281 Dementia in other diseases classified elsewhere with behavioral disturbance: Secondary | ICD-10-CM

## 2018-07-18 DIAGNOSIS — H353130 Nonexudative age-related macular degeneration, bilateral, stage unspecified: Secondary | ICD-10-CM | POA: Diagnosis not present

## 2018-07-18 DIAGNOSIS — F22 Delusional disorders: Secondary | ICD-10-CM | POA: Insufficient documentation

## 2018-07-18 HISTORY — DX: Delusional disorders: F22

## 2018-07-18 HISTORY — DX: Nonexudative age-related macular degeneration, bilateral, stage unspecified: H35.3130

## 2018-07-18 HISTORY — DX: Dry eye syndrome of bilateral lacrimal glands: H04.123

## 2018-07-18 HISTORY — DX: Essential tremor: G25.0

## 2018-07-18 MED ORDER — VITAMIN C 500 MG PO TABS: 500.0000 mg | ORAL_TABLET | Freq: Every day | ORAL | 11 refills | Status: AC
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
## 2018-08-16 DIAGNOSIS — Z23 Encounter for immunization: Secondary | ICD-10-CM | POA: Diagnosis not present

## 2018-10-01 DIAGNOSIS — B351 Tinea unguium: Secondary | ICD-10-CM | POA: Diagnosis not present

## 2018-10-24 ENCOUNTER — Non-Acute Institutional Stay: Payer: Medicare Other | Admitting: Internal Medicine

## 2018-10-24 ENCOUNTER — Encounter: Payer: Self-pay | Admitting: Internal Medicine

## 2018-10-24 VITALS — BP 120/62 | HR 49 | Temp 98.0°F | Ht 69.0 in | Wt 160.0 lb

## 2018-10-24 DIAGNOSIS — G301 Alzheimer's disease with late onset: Secondary | ICD-10-CM

## 2018-10-24 DIAGNOSIS — M810 Age-related osteoporosis without current pathological fracture: Secondary | ICD-10-CM | POA: Diagnosis not present

## 2018-10-24 DIAGNOSIS — F02818 Dementia in other diseases classified elsewhere, unspecified severity, with other behavioral disturbance: Secondary | ICD-10-CM

## 2018-10-24 DIAGNOSIS — F3341 Major depressive disorder, recurrent, in partial remission: Secondary | ICD-10-CM | POA: Diagnosis not present

## 2018-10-24 DIAGNOSIS — J069 Acute upper respiratory infection, unspecified: Secondary | ICD-10-CM

## 2018-10-24 DIAGNOSIS — R29890 Loss of height: Secondary | ICD-10-CM

## 2018-10-24 DIAGNOSIS — F0281 Dementia in other diseases classified elsewhere with behavioral disturbance: Secondary | ICD-10-CM

## 2018-10-24 DIAGNOSIS — B9789 Other viral agents as the cause of diseases classified elsewhere: Secondary | ICD-10-CM | POA: Diagnosis not present

## 2018-10-24 DIAGNOSIS — F22 Delusional disorders: Secondary | ICD-10-CM

## 2018-10-24 NOTE — Progress Notes (Signed)
Location:  [facility]upational psychologist of Service:  Clinic (12)  Provider: Elizbeth Posa L. Mariea Clonts, D.O., C.M.D.  Goals of Care:  Advanced Directives 05/02/2018  Does Patient Have a Medical Advance Directive? Yes  Type of Advance Directive [place]  Does patient want to make changes to medical advance directive? No - Patient declined  Copy of [place] in Chart? Yes   Chief Complaint  Patient presents with  . Medical Management of Chronic Issues    13mth follow-up    HPI: Patient is a 85 y.o. female seen today for medical management of chronic diseases.    AL resident here with her daughter-in-law.  She seems to be adjusting better now.  She does play games with other ladies in the skilled unit.  She reports not having found anyone talented enough to play bridge with her.    Memory has declined considerably and she does not remember several years of her life when she lived in [place] after moving from her apt in [place].  She remains on aricept.  She is eating and sleeping well.  Bowels are moving.    BP is well controlled w/o dizziness on atenolol, dyazide.  Encouraged hydration.  She has lost height.   She is not thrilled about getting a bone density, but her daughter in law agrees to take her one afternoon so order written.  Past Medical History:  Diagnosis Date  . Cholestatic hepatitis    2001  . Depression   . Fatigue   . GERD (gastroesophageal reflux disease)   . Hyperlipidemia   . Hypertension   . Insomnia   . Memory loss   . Osteoporosis   . Sciatica     Past Surgical History:  Procedure Laterality Date  . APPENDECTOMY    . CARDIAC CATHETERIZATION    . FACIAL COSMETIC SURGERY    . TONSILLECTOMY    . TOTAL ABDOMINAL HYSTERECTOMY W/ BILATERAL SALPINGOOPHORECTOMY      Allergies  Allergen Reactions  . Garlic   . Lac Bovis   . Lanolin   . Penicillins     ? rash  . Statins     myalgia    Outpatient  Encounter Medications as of 10/24/2018  Medication Sig  . atenolol (TENORMIN) 50 MG tablet Take 50 mg by mouth daily.   . Calcium Carbonate-Vitamin D 600-400 MG-UNIT chew tablet Chew 1 tablet by mouth 3 (three) times daily with meals.  . carboxymethylcellulose (REFRESH PLUS) 0.5 % SOLN 1 drop 3 (three) times daily as needed.  . Cholecalciferol (D3 HIGH POTENCY) 2000 units CAPS Take 1 capsule (2,000 Units total) by mouth daily.  Marland Kitchen donepezil (ARICEPT) 10 MG tablet Take 1 tablet (10 mg total) by mouth at bedtime.  . Estradiol 10 MCG TABS vaginal tablet Insert Vaginally five times a week  . FLUoxetine (PROZAC) 20 MG capsule TAKE 1 CAPSULE DAILY  . levothyroxine (SYNTHROID, LEVOTHROID) 25 MCG tablet Take 25 mcg by mouth at bedtime.  . Melatonin 10 MG TABS Take 1 tablet by mouth at bedtime.  . Multiple Vitamins-Minerals (PRESERVISION AREDS 2) CAPS Take 1 capsule by mouth 2 (two) times daily.  Marland Kitchen omeprazole (PRILOSEC) 20 MG capsule Take 20 mg by mouth daily.   . ranitidine (ZANTAC) 150 MG tablet TAKE 1 TABLET DAILY  . traZODone (DESYREL) 50 MG tablet Take 50 mg by mouth at bedtime.   . triamterene-hydrochlorothiazide (DYAZIDE) 37.5-25 MG capsule Take 1 capsule by mouth daily.   Marland Kitchen  vitamin C (ASCORBIC ACID) 500 MG tablet Take 1 tablet (500 mg total) by mouth daily.   No facility-administered encounter medications on file as of 10/24/2018.     Review of Systems:  Review of Systems  Constitutional: Negative for chills, fever and malaise/fatigue.  HENT: Negative for hearing loss.   Eyes: Negative for blurred vision.  Respiratory: Negative for cough and shortness of breath.   Cardiovascular: Negative for chest pain, palpitations and leg swelling.  Gastrointestinal: Negative for abdominal pain, blood in stool, constipation and melena.  Genitourinary: Positive for frequency. Negative for dysuria.  Musculoskeletal: Negative for falls and joint pain.  Skin: Negative for itching and rash.  Neurological:  Negative for dizziness and loss of consciousness.  Endo/Heme/Allergies: Does not bruise/bleed easily.  Psychiatric/Behavioral: Positive for memory loss. Negative for depression. The patient is not nervous/anxious and does not have insomnia.        Less paranoid psychosis since last visit but not on antipsychotic    Health Maintenance  Topic Date Due  . TETANUS/TDAP  08/31/2016  . INFLUENZA VACCINE  Completed  . DEXA SCAN  Completed  . PNA vac Low Risk Adult  Completed    Physical Exam: Vitals:   10/24/18 1534  BP: 120/62  Pulse: (!) 49  Temp: 98 F (36.7 C)  TempSrc: Oral  SpO2: 95%  Weight: 160 lb (72.6 kg)  Height: 5\' 9"  (1.753 m)   Body mass index is 23.63 kg/m. Physical Exam Constitutional:      Appearance: Normal appearance.  HENT:     Head: Normocephalic and atraumatic.  Cardiovascular:     Rate and Rhythm: Normal rate and regular rhythm.     Pulses: Normal pulses.     Heart sounds: Normal heart sounds.  Pulmonary:     Effort: Pulmonary effort is normal.     Breath sounds: Normal breath sounds.  Abdominal:     General: Bowel sounds are normal.  Musculoskeletal: Normal range of motion.     Comments: Walks w/o assistive device  Skin:    General: Skin is warm and dry.     Capillary Refill: Capillary refill takes less than 2 seconds.  Neurological:     General: No focal deficit present.     Mental Status: She is alert.     Comments: Pleasantly confused, but can get irritable if redirected about her recollection of the past; tremor present  Psychiatric:        Mood and Affect: Mood normal.     Labs reviewed: Basic Metabolic Panel: Recent Labs    04/19/18 0730  NA 144  K 3.5  BUN 26*  CREATININE 1.1  TSH 5.89   Liver Function Tests: Recent Labs    04/19/18 0730  AST 13  ALT 13  ALKPHOS 77   No results for input(s): LIPASE, AMYLASE in the last 8760 hours. No results for input(s): AMMONIA in the last 8760 hours. CBC: Recent Labs     04/19/18 0730  WBC 8.1  HGB 12.1  HCT 37  PLT 182   Lipid Panel: Recent Labs    04/19/18 0730  CHOL 244*  HDL 54  LDLCALC 168  TRIG 110   No results found for: HGBA1C  Procedures since last visit: No results found.  Assessment/Plan 1. Loss of height -likely OP related, check bone density -cont vitamin D3  2. Paranoid delusion ([place]) -reportedly better  3. Late onset Alzheimer's disease with behavioral disturbance ([place]) -cont aricept and AL support, needs a lot  of cues and redirection, but seems to be adjusting better now  4. Recurrent major depressive disorder, in partial remission ([place]) -doing well with trazodone at hs and prozac by day  5. Senile osteoporosis Bone density at breast center ordered  6.  Viral uri with cough:  Cough better from a few days ago--seems viral and running its course   Labs/tests ordered: bone density Next appt:  02/27/2019   Sheriece Jefcoat L. Milinda Sweeney, D.O. [facility] [address], [place] [address] Cell Phone (Mon-Fri 8am-5pm):  [phone] On Call:  [phone] & follow prompts after 5pm & weekends Office Phone:  [phone] Office Fax:  [phone]

## 2018-10-25 ENCOUNTER — Other Ambulatory Visit: Payer: Self-pay | Admitting: Internal Medicine

## 2018-10-25 DIAGNOSIS — E2839 Other primary ovarian failure: Secondary | ICD-10-CM

## 2018-10-25 LAB — TSH: TSH: 2.84 (ref 0.41–5.90)

## 2018-10-31 ENCOUNTER — Encounter: Payer: Self-pay | Admitting: Internal Medicine

## 2018-10-31 ENCOUNTER — Telehealth: Payer: Self-pay | Admitting: *Deleted

## 2018-10-31 NOTE — Telephone Encounter (Signed)
April Stevenson, POA called and stated that he had some concerns with the bill he received from [facility] through [facility]. Informed him that he would need to call them. He agreed but wanted a list of medications we had listed for her. Medications in current list given.

## 2018-12-17 ENCOUNTER — Ambulatory Visit
Admission: RE | Admit: 2018-12-17 | Discharge: 2018-12-17 | Disposition: A | Discharge status: Home / Self Care | Payer: Medicare Other | Source: Ambulatory Visit | Attending: Internal Medicine | Admitting: Internal Medicine

## 2018-12-17 DIAGNOSIS — M85832 Other specified disorders of bone density and structure, left forearm: Secondary | ICD-10-CM | POA: Diagnosis not present

## 2018-12-17 DIAGNOSIS — Z780 Asymptomatic menopausal state: Secondary | ICD-10-CM | POA: Diagnosis not present

## 2018-12-17 DIAGNOSIS — E2839 Other primary ovarian failure: Secondary | ICD-10-CM

## 2018-12-18 ENCOUNTER — Encounter: Payer: Self-pay | Admitting: *Deleted

## 2018-12-26 ENCOUNTER — Other Ambulatory Visit: Payer: Self-pay | Admitting: Internal Medicine

## 2018-12-26 DIAGNOSIS — I10 Essential (primary) hypertension: Secondary | ICD-10-CM

## 2018-12-26 DIAGNOSIS — F0281 Dementia in other diseases classified elsewhere with behavioral disturbance: Secondary | ICD-10-CM

## 2018-12-26 DIAGNOSIS — F02818 Dementia in other diseases classified elsewhere, unspecified severity, with other behavioral disturbance: Secondary | ICD-10-CM

## 2018-12-26 DIAGNOSIS — G301 Alzheimer's disease with late onset: Principal | ICD-10-CM

## 2018-12-26 MED ORDER — IRBESARTAN 150 MG PO TABS: 150.0000 mg | ORAL_TABLET | Freq: Every day | ORAL | 3 refills | Status: DC

## 2018-12-26 NOTE — Progress Notes (Signed)
BP high consistently on daily checks:  Running in 307O to 600G systolic over 98O to 73G most of the time.  Will d/c triamterene/hctz and start irbesartan 150mg  po daily instead.  Continue daily bp checks.

## 2019-02-25 ENCOUNTER — Encounter: Payer: Self-pay | Admitting: Adult Health

## 2019-02-25 ENCOUNTER — Non-Acute Institutional Stay: Payer: Medicare Other | Admitting: Adult Health

## 2019-02-25 DIAGNOSIS — G301 Alzheimer's disease with late onset: Secondary | ICD-10-CM

## 2019-02-25 DIAGNOSIS — R635 Abnormal weight gain: Secondary | ICD-10-CM

## 2019-02-25 DIAGNOSIS — F0281 Dementia in other diseases classified elsewhere with behavioral disturbance: Secondary | ICD-10-CM

## 2019-02-25 DIAGNOSIS — R0902 Hypoxemia: Secondary | ICD-10-CM | POA: Diagnosis not present

## 2019-02-25 DIAGNOSIS — I10 Essential (primary) hypertension: Secondary | ICD-10-CM

## 2019-02-25 DIAGNOSIS — F02818 Dementia in other diseases classified elsewhere, unspecified severity, with other behavioral disturbance: Secondary | ICD-10-CM

## 2019-02-25 NOTE — Progress Notes (Signed)
Location:  Occupational psychologist of Service:  ALF (13) Provider:   Cindi Carbon, ANP [place] [phone]  Gayland Curry, DO  Patient Care Team: Gayland Curry, DO as PCP - General (Geriatric Medicine)  Extended Emergency Contact Information Primary Emergency Contact: Su Monks Address: [address]          [place], [place] [address] Johnnette Litter of [place] Work Phone: [phone] Mobile Phone: [phone] Relation: Son Secondary Emergency Contact: Flonnie Overman Address: [address]          [place], [place] [address] Johnnette Litter of [place] Mobile Phone: [phone] Relation: Relative  Code Status:  Full code Goals of care: Advanced Directive information Advanced Directives 05/02/2018  Does Patient Have a Medical Advance Directive? Yes  Type of Advance Directive [place]  Does patient want to make changes to medical advance directive? No - Patient declined  Copy of [place] in Chart? Yes     Chief Complaint  Patient presents with  . Acute Visit    htn, edema, weakness    HPI:  Pt is a 85 y.o. female seen today for an acute visit for htn, edema, and weakness. Staff provided me with blood pressures for the past two months that indicate her BP has been in the 160-190 range. She has also gained 8 lbs and has new edema in both feet. The staff have noted that she tires easily and has shortness of breath with ambulation. The resident denies any cp or sob. She doesn't seem to understand why the staff is worried about her. She has progressive dementia and the staff has noticed that she requires more cuing, reminders, etc. It takes her increasingly longer to finish her morning routine.  In march she was changed from triamterene/hctz to irbesartan due to uncontrolled bp. She is also wearing compression hose. Her shoes are not fitting well. She is eating less per staff but still gaining weight.   Upon  my visit her sats were in the 87-88% range. With deep breathing they increased to 90%.  She was no in distress and denied feeling short of breath.   Past Medical History:  Diagnosis Date  . Cholestatic hepatitis    2001  . Depression   . Fatigue   . GERD (gastroesophageal reflux disease)   . Hyperlipidemia   . Hypertension   . Insomnia   . Memory loss   . Osteoporosis   . Sciatica    Past Surgical History:  Procedure Laterality Date  . APPENDECTOMY    . CARDIAC CATHETERIZATION    . FACIAL COSMETIC SURGERY    . TONSILLECTOMY    . TOTAL ABDOMINAL HYSTERECTOMY W/ BILATERAL SALPINGOOPHORECTOMY      Allergies  Allergen Reactions  . Garlic   . Lac Bovis   . Lanolin   . Penicillins     ? rash  . Statins     myalgia    Outpatient Encounter Medications as of 02/25/2019  Medication Sig  . atenolol (TENORMIN) 50 MG tablet Take 50 mg by mouth daily.   . Calcium Carbonate-Vitamin D 600-400 MG-UNIT chew tablet Chew 1 tablet by mouth 3 (three) times daily with meals. (Patient taking differently: Chew 1 tablet by mouth 2 (two) times daily. )  . carboxymethylcellulose (REFRESH PLUS) 0.5 % SOLN 1 drop 3 (three) times daily.   . Cholecalciferol (D3 HIGH POTENCY) 2000 units CAPS Take 1 capsule (2,000 Units total) by mouth daily.  Marland Kitchen  donepezil (ARICEPT) 10 MG tablet Take 1 tablet (10 mg total) by mouth at bedtime.  . Estradiol 10 MCG TABS vaginal tablet Insert Vaginally five times a week  . FLUoxetine (PROZAC) 20 MG capsule 40 mg daily.   . irbesartan (AVAPRO) 150 MG tablet Take 1 tablet (150 mg total) by mouth daily.  Marland Kitchen levothyroxine (SYNTHROID, LEVOTHROID) 25 MCG tablet Take 25 mcg by mouth at bedtime.  . Melatonin 10 MG TABS Take 1 tablet by mouth at bedtime.  . Multiple Vitamins-Minerals (PRESERVISION AREDS 2) CAPS Take 1 capsule by mouth 2 (two) times daily.  Marland Kitchen omeprazole (PRILOSEC) 20 MG capsule Take 20 mg by mouth daily.   . traZODone (DESYREL) 50 MG tablet Take 50 mg by mouth at  bedtime.   . vitamin C (ASCORBIC ACID) 500 MG tablet Take 1 tablet (500 mg total) by mouth daily.  . [DISCONTINUED] ranitidine (ZANTAC) 150 MG tablet TAKE 1 TABLET DAILY   No facility-administered encounter medications on file as of 02/25/2019.     Review of Systems  Constitutional: Positive for activity change, appetite change and fatigue. Negative for chills, diaphoresis, fever and unexpected weight change.  HENT: Negative for congestion.   Respiratory: Positive for shortness of breath. Negative for cough and wheezing.   Cardiovascular: Positive for leg swelling. Negative for chest pain and palpitations.  Gastrointestinal: Negative for abdominal distention, abdominal pain, constipation and diarrhea.  Genitourinary: Negative for difficulty urinating and dysuria.  Musculoskeletal: Positive for gait problem. Negative for arthralgias, back pain, joint swelling and myalgias.  Neurological: Negative for dizziness, tremors, seizures, syncope, facial asymmetry, speech difficulty, weakness, light-headedness, numbness and headaches.  Psychiatric/Behavioral: Positive for confusion. Negative for agitation and behavioral problems.    Immunization History  Administered Date(s) Administered  . Influenza Split 08/14/2009, 06/29/2010, 07/19/2011, 07/29/2016  . Influenza, High Dose Seasonal PF 08/08/2012, 07/26/2013, 09/17/2014, 07/31/2015  . Influenza, Quadrivalent, Recombinant, Inj, Pf 07/19/2017  . Influenza,inj,Quad PF,6+ Mos 08/16/2018  . Influenza,trivalent, recombinat, inj, PF 07/26/2016  . Pneumococcal Conjugate-13 08/21/2009  . Pneumococcal Polysaccharide-23 04/20/2018  . Td 10/13/1997  . Tdap 08/31/2006  . Zoster 06/15/2011   Pertinent  Health Maintenance Due  Topic Date Due  . INFLUENZA VACCINE  05/18/2019  . DEXA SCAN  Completed  . PNA vac Low Risk Adult  Completed   Fall Risk  10/24/2018 07/18/2018 05/02/2018  Falls in the past year? 0 Yes No  Number falls in past yr: 0 1 -  Injury  with Fall? 0 No -   Functional Status Survey:    Vitals:   02/25/19 1352  BP: (!) 179/80  Pulse: 61  Resp: 18  Temp: 97.8 F (36.6 C)  Weight: 169 lb 6.4 oz (76.8 kg)   Body mass index is 25.02 kg/m. Physical Exam Vitals signs and nursing note reviewed.  Constitutional:      General: She is not in acute distress.    Appearance: She is not diaphoretic.  HENT:     Head: Normocephalic and atraumatic.     Nose: Nose normal. No congestion or rhinorrhea.     Mouth/Throat:     Mouth: Mucous membranes are moist.     Pharynx: Oropharynx is clear. No oropharyngeal exudate.  Eyes:     General:        Right eye: No discharge.        Left eye: No discharge.     Conjunctiva/sclera: Conjunctivae normal.     Pupils: Pupils are equal, round, and reactive to light.  Neck:  Musculoskeletal: No muscular tenderness.     Vascular: No carotid bruit or JVD.  Cardiovascular:     Rate and Rhythm: Normal rate and regular rhythm.     Heart sounds: No murmur.  Pulmonary:     Effort: Pulmonary effort is normal. No respiratory distress.     Breath sounds: Normal breath sounds. No wheezing.  Abdominal:     General: Bowel sounds are normal. There is no distension.     Palpations: Abdomen is soft.     Tenderness: There is no abdominal tenderness.  Musculoskeletal:     Right lower leg: Edema present.     Left lower leg: Edema present.  Lymphadenopathy:     Cervical: No cervical adenopathy.  Skin:    General: Skin is warm and dry.  Neurological:     General: No focal deficit present.     Mental Status: She is alert.     Cranial Nerves: No cranial nerve deficit.     Comments: Oriented x 2  Psychiatric:        Mood and Affect: Mood normal.     Labs reviewed: Recent Labs    04/19/18 0730  NA 144  K 3.5  BUN 26*  CREATININE 1.1   Recent Labs    04/19/18 0730  AST 13  ALT 13  ALKPHOS 77   Recent Labs    04/19/18 0730  WBC 8.1  HGB 12.1  HCT 37  PLT 182   Lab Results   Component Value Date   TSH 2.84 10/25/2018   No results found for: HGBA1C Lab Results  Component Value Date   CHOL 244 (A) 04/19/2018   HDL 54 04/19/2018   LDLCALC 168 04/19/2018   TRIG 110 04/19/2018    Significant Diagnostic Results in last 30 days:  No results found.  Assessment/Plan  1. Uncontrolled hypertension Will treat volume overload first gently due to age and fall risk Will likely need additional measure at follow up apt.   2. Weight gain Likely due to uncontrolled htn but would consider diagnosis of CHF, needs follow up with Dr. Mariea Clonts on Wednesday 5/13 Check CXR due to borderline 02 sat  Lasix 20 mg qd x 3 days Kdur 10 meq qd x 3 days  3. Late onset Alzheimer's disease with behavioral disturbance ([place]) Seems to be progressing but may be aggravated by her recent routine change and lack of socialization due to Covid 19    Family/ staff Communication: staff/resident  Labs/tests ordered:  CBC BMP CXR

## 2019-02-26 DIAGNOSIS — B351 Tinea unguium: Secondary | ICD-10-CM | POA: Diagnosis not present

## 2019-02-26 DIAGNOSIS — Z79899 Other long term (current) drug therapy: Secondary | ICD-10-CM | POA: Diagnosis not present

## 2019-02-26 DIAGNOSIS — Q6689 Other  specified congenital deformities of feet: Secondary | ICD-10-CM | POA: Diagnosis not present

## 2019-02-26 DIAGNOSIS — M79672 Pain in left foot: Secondary | ICD-10-CM | POA: Diagnosis not present

## 2019-02-26 DIAGNOSIS — M79671 Pain in right foot: Secondary | ICD-10-CM | POA: Diagnosis not present

## 2019-02-26 DIAGNOSIS — D649 Anemia, unspecified: Secondary | ICD-10-CM | POA: Diagnosis not present

## 2019-02-26 DIAGNOSIS — R17 Unspecified jaundice: Secondary | ICD-10-CM | POA: Diagnosis not present

## 2019-02-26 DIAGNOSIS — I10 Essential (primary) hypertension: Secondary | ICD-10-CM | POA: Diagnosis not present

## 2019-02-26 LAB — CBC AND DIFFERENTIAL
HCT: 35 — AB (ref 36–46)
Hemoglobin: 11.9 — AB (ref 12.0–16.0)
Platelets: 176 (ref 150–399)
WBC: 9.2

## 2019-02-26 LAB — BASIC METABOLIC PANEL
BUN: 28 — AB (ref 4–21)
Creatinine: 1 (ref 0.5–1.1)
Glucose: 106
Potassium: 3.4 (ref 3.4–5.3)
Sodium: 142 (ref 137–147)

## 2019-02-26 LAB — HEPATIC FUNCTION PANEL
ALT: 49 — AB (ref 7–35)
AST: 28 (ref 13–35)
Alkaline Phosphatase: 119 (ref 25–125)
Bilirubin, Total: 0.4

## 2019-02-27 ENCOUNTER — Encounter: Payer: Self-pay | Admitting: Internal Medicine

## 2019-02-27 ENCOUNTER — Other Ambulatory Visit: Payer: Self-pay

## 2019-02-27 ENCOUNTER — Non-Acute Institutional Stay: Payer: Medicare Other | Admitting: Internal Medicine

## 2019-02-27 VITALS — BP 160/70 | HR 67 | Temp 97.7°F | Ht 69.0 in | Wt 171.0 lb

## 2019-02-27 DIAGNOSIS — G301 Alzheimer's disease with late onset: Secondary | ICD-10-CM | POA: Diagnosis not present

## 2019-02-27 DIAGNOSIS — I10 Essential (primary) hypertension: Secondary | ICD-10-CM

## 2019-02-27 DIAGNOSIS — I509 Heart failure, unspecified: Secondary | ICD-10-CM | POA: Diagnosis not present

## 2019-02-27 DIAGNOSIS — F0281 Dementia in other diseases classified elsewhere with behavioral disturbance: Secondary | ICD-10-CM

## 2019-02-27 DIAGNOSIS — R635 Abnormal weight gain: Secondary | ICD-10-CM

## 2019-02-27 DIAGNOSIS — F02818 Dementia in other diseases classified elsewhere, unspecified severity, with other behavioral disturbance: Secondary | ICD-10-CM

## 2019-02-27 NOTE — Progress Notes (Signed)
Location:  [facility] of Service:  Clinic (12)  Provider: Chloe Miyoshi L. Mariea Clonts, D.O., C.M.D.  Code Status: DNR Goals of Care:  Advanced Directives 05/02/2018  Does Patient Have a Medical Advance Directive? Yes  Type of Advance Directive [place]  Does patient want to make changes to medical advance directive? No - Patient declined  Copy of [place] in Chart? Yes     Chief Complaint  Patient presents with  . Medical Management of Chronic Issues    67mth follow-up    HPI: Patient is a 85 y.o. female seen today for an acute visit for weight gain, dyspnea on exertion and decreased mobility.  She has a h/o AD with paranoid delusions, right greater than left essential tremor, falls.  Her son was on the line via video visit, Su Monks.    NP saw patient on 5/11 due to the weight gain and her decline.  She ordered basic labs and a chest xray.  Labs unremarkable.  CXR with moderate fluid overload.  Pt had been off diuretic component of her bp medication due to urinary incontinence.  She is now having volume overload, weight gain.  She is weak and needing more supervision.  She was given 3 days of lasix 20mg  and kcl 23meq daily, but weight did not go down (weights not done at routine time due to patient resistance).  Meds are becoming more of a struggle, as well.    Due to isolation, she's been getting less exercise and social interaction which has not helped her either.  She sleeps until 71 and has a do not disturb order through that time.  She may be eating more sodium than she should be.    She's had a blister of her right medial great toe associated with the severe edema.  She is now wearing the ted hose.  This was a struggle due to her resistance.  They are to be on after her am bathing and off at bedtime.    BPs have been elevated despite initial med changes.  They have ranged from 034 to 742 systolic over 59D diastolic.  This certainly could  be contributing to her new volume overload/chf.    DON thought she appeared jaundice so liver panel was done also, but now appears her normal skin tone.    Pt's only complaint is her chronic fatigue that she's had for years.    Her dementia is progressing.  She is requiring more assistance to get ready in the mornings and get ready for bed at hs.  She's starting to resist taking her medications.    Past Medical History:  Diagnosis Date  . Cholestatic hepatitis    2001  . Depression   . Fatigue   . GERD (gastroesophageal reflux disease)   . Hyperlipidemia   . Hypertension   . Insomnia   . Memory loss   . Osteoporosis   . Sciatica     Past Surgical History:  Procedure Laterality Date  . APPENDECTOMY    . CARDIAC CATHETERIZATION    . FACIAL COSMETIC SURGERY    . TONSILLECTOMY    . TOTAL ABDOMINAL HYSTERECTOMY W/ BILATERAL SALPINGOOPHORECTOMY      Allergies  Allergen Reactions  . Garlic   . Lac Bovis   . Lanolin   . Penicillins     ? rash  . Statins     myalgia    Outpatient Encounter Medications as of 02/27/2019  Medication Sig  . atenolol (TENORMIN) 50 MG tablet Take 50 mg by mouth daily.   . Calcium Carbonate-Vitamin D 600-400 MG-UNIT chew tablet Chew 1 tablet by mouth 3 (three) times daily with meals.  . carboxymethylcellulose (REFRESH PLUS) 0.5 % SOLN 1 drop 3 (three) times daily.   . Cholecalciferol (D3 HIGH POTENCY) 2000 units CAPS Take 1 capsule (2,000 Units total) by mouth daily.  Marland Kitchen donepezil (ARICEPT) 10 MG tablet Take 1 tablet (10 mg total) by mouth at bedtime.  . Estradiol 10 MCG TABS vaginal tablet Insert Vaginally five times a week  . FLUoxetine (PROZAC) 20 MG capsule 40 mg daily.   . irbesartan (AVAPRO) 150 MG tablet Take 1 tablet (150 mg total) by mouth daily.  Marland Kitchen levothyroxine (SYNTHROID, LEVOTHROID) 25 MCG tablet Take 25 mcg by mouth at bedtime.  . Melatonin 10 MG TABS Take 1 tablet by mouth at bedtime.  . Multiple Vitamins-Minerals (PRESERVISION  AREDS 2) CAPS Take 1 capsule by mouth 2 (two) times daily.  Marland Kitchen omeprazole (PRILOSEC) 20 MG capsule Take 20 mg by mouth daily.   . traZODone (DESYREL) 50 MG tablet Take 50 mg by mouth at bedtime.   . vitamin C (ASCORBIC ACID) 500 MG tablet Take 1 tablet (500 mg total) by mouth daily.   No facility-administered encounter medications on file as of 02/27/2019.    Review of Systems:  Review of Systems  Constitutional: Positive for malaise/fatigue. Negative for chills and fever.       Weight gain  HENT: Positive for hearing loss.   Eyes: Positive for blurred vision.       Not able to see well based on her interpretation of screen during ipad video visit with her son, [name]  Respiratory: Positive for shortness of breath. Negative for cough, sputum production and wheezing.   Cardiovascular: Positive for leg swelling. Negative for chest pain and palpitations.  Gastrointestinal: Negative for abdominal pain, blood in stool, constipation, diarrhea and melena.  Genitourinary: Negative for dysuria.  Musculoskeletal: Positive for falls.  Neurological: Negative for loss of consciousness and headaches.  Endo/Heme/Allergies: Bruises/bleeds easily.  Psychiatric/Behavioral: Positive for memory loss. Negative for depression.    Health Maintenance  Topic Date Due  . TETANUS/TDAP  08/31/2016  . INFLUENZA VACCINE  05/18/2019  . DEXA SCAN  Completed  . PNA vac Low Risk Adult  Completed   Physical Exam: Vitals:   02/27/19 1356  BP: (!) 160/70  Pulse: 67  Temp: 97.7 F (36.5 C)  TempSrc: Oral  SpO2: 97%  Weight: 171 lb (77.6 kg)  Height: 5\' 9"  (1.753 m)   Body mass index is 25.25 kg/m. Physical Exam Vitals signs and nursing note reviewed.  Constitutional:      Comments: Was dyspneic when walking short distance; disheveled with makeup  HENT:     Head: Normocephalic and atraumatic.  Neck:     Musculoskeletal: Neck supple.  Cardiovascular:     Rate and Rhythm: Normal rate.     Pulses: Normal  pulses.     Heart sounds: Normal heart sounds.  Pulmonary:     Effort: Pulmonary effort is normal.     Breath sounds: No rales.  Musculoskeletal: Normal range of motion.     Right lower leg: Edema present.     Left lower leg: Edema present.     Comments: 1+ edema, wearing compression hose  Skin:    General: Skin is warm and dry.     Comments: Toenails trimmed short; area now scabbed over  on great toe of left foot; has blister which is dressed with a cushioned foam dressing  Neurological:     General: No focal deficit present.     Mental Status: She is alert.     Comments: Right sided tremor, rather fast, head tremor also  Psychiatric:        Mood and Affect: Mood normal.     Labs reviewed: Basic Metabolic Panel: Recent Labs    04/19/18 0730 10/25/18  NA 144  --   K 3.5  --   BUN 26*  --   CREATININE 1.1  --   TSH 5.89 2.84   Liver Function Tests: Recent Labs    04/19/18 0730  AST 13  ALT 13  ALKPHOS 77   No results for input(s): LIPASE, AMYLASE in the last 8760 hours. No results for input(s): AMMONIA in the last 8760 hours. CBC: Recent Labs    04/19/18 0730  WBC 8.1  HGB 12.1  HCT 37  PLT 182   Lipid Panel: Recent Labs    04/19/18 0730  CHOL 244*  HDL 54  LDLCALC 168  TRIG 110   No results found for: HGBA1C  Procedures since last visit: No results found.  Assessment/Plan 1. Acute congestive heart failure, unspecified heart failure type ([place]) -obtain portable echocardiogram to eval for EF, diastolic function, valvular disease, pulmonary htn -lasix as below and improve bp control -avoid high sodium foods -encourage physical activity with staff as tolerates  2. Uncontrolled hypertension -increase irbesartan to 300mg  daily plus the new diuretic  3. Weight gain -start lasix 40mg  qod and kcl 51meq qod -bmp in one week -cont bid bps ideally after meds  4. Late onset Alzheimer's disease with behavioral disturbance ([place]) -OT involved with her  due to need for level of care eval as well as her edema/compression hose use  Labs/tests ordered:  Bmp in one week Next appt:  03/20/2019   Lovelee Forner L. Zhaire Locker, D.O. [facility] [address], [place] [address] Cell Phone (Mon-Fri 8am-5pm):  [phone] On Call:  [phone] & follow prompts after 5pm & weekends Office Phone:  [phone] Office Fax:  [phone]

## 2019-02-28 DIAGNOSIS — R0600 Dyspnea, unspecified: Secondary | ICD-10-CM | POA: Diagnosis not present

## 2019-03-01 ENCOUNTER — Non-Acute Institutional Stay (INDEPENDENT_AMBULATORY_CARE_PROVIDER_SITE_OTHER): Payer: Medicare Other | Admitting: Internal Medicine

## 2019-03-01 DIAGNOSIS — Z7189 Other specified counseling: Secondary | ICD-10-CM | POA: Diagnosis not present

## 2019-03-01 NOTE — Progress Notes (Addendum)
MOST form completion done today with Charlann Noss and e-signature obtained.  Document in vynca and social work to print for paper chart in [employer] and email a copy to [place].  22 minutes spent on MOST completion.

## 2019-03-04 DIAGNOSIS — M6389 Disorders of muscle in diseases classified elsewhere, multiple sites: Secondary | ICD-10-CM | POA: Diagnosis not present

## 2019-03-04 DIAGNOSIS — G250 Essential tremor: Secondary | ICD-10-CM | POA: Diagnosis not present

## 2019-03-04 DIAGNOSIS — R600 Localized edema: Secondary | ICD-10-CM | POA: Diagnosis not present

## 2019-03-04 DIAGNOSIS — R278 Other lack of coordination: Secondary | ICD-10-CM | POA: Diagnosis not present

## 2019-03-05 ENCOUNTER — Encounter: Payer: Self-pay | Admitting: Internal Medicine

## 2019-03-05 ENCOUNTER — Non-Acute Institutional Stay: Payer: Medicare Other | Admitting: Internal Medicine

## 2019-03-05 DIAGNOSIS — I10 Essential (primary) hypertension: Secondary | ICD-10-CM

## 2019-03-05 DIAGNOSIS — I340 Nonrheumatic mitral (valve) insufficiency: Secondary | ICD-10-CM

## 2019-03-05 DIAGNOSIS — I272 Pulmonary hypertension, unspecified: Secondary | ICD-10-CM | POA: Diagnosis not present

## 2019-03-05 DIAGNOSIS — L03115 Cellulitis of right lower limb: Secondary | ICD-10-CM

## 2019-03-05 DIAGNOSIS — I351 Nonrheumatic aortic (valve) insufficiency: Secondary | ICD-10-CM | POA: Diagnosis not present

## 2019-03-05 DIAGNOSIS — I509 Heart failure, unspecified: Secondary | ICD-10-CM | POA: Diagnosis not present

## 2019-03-05 DIAGNOSIS — D649 Anemia, unspecified: Secondary | ICD-10-CM | POA: Diagnosis not present

## 2019-03-05 LAB — BASIC METABOLIC PANEL
BUN: 26 — AB (ref 4–21)
Creatinine: 0.9 (ref 0.5–1.1)
Glucose: 104
Potassium: 3.6 (ref 3.4–5.3)
Sodium: 139 (ref 137–147)

## 2019-03-05 NOTE — Progress Notes (Signed)
Patient ID: April Stevenson, female   DOB: 1923-11-13, 85 y.o.   MRN: [id number]  Location:  [facility] Room Number: 664 Place of Service:  ALF [id number]) Provider:   Gayland Curry, DO  Patient Care Team: Gayland Curry, DO as PCP - General (Geriatric Medicine)  Extended Emergency Contact Information Primary Emergency Contact: Su Monks Address: [address]          [place], [place] [address] Johnnette Litter of [place] Work Phone: [phone] Mobile Phone: [phone] Relation: Son Secondary Emergency Contact: Flonnie Overman Address: [address]          [place], [place] [address] Johnnette Litter of [place] Mobile Phone: [phone] Relation: Relative  Code Status:  DNR, MOST form just completed last week Goals of care: Advanced Directive information Advanced Directives 05/02/2018  Does Patient Have a Medical Advance Directive? Yes  Type of Advance Directive [place]  Does patient want to make changes to medical advance directive? No - Patient declined  Copy of [place] in Chart? Yes     Chief Complaint  Patient presents with  . Acute Visit    right leg red with possible cellulitis?, discuss results from ECHO    HPI:  Pt is a 85 y.o. female seen today for an acute visit for right leg erythema,  f/u on chf and to call her son, [name], to review her echo results.  Nursing staff noted that pt's legs remain swollen but a bit improved with diuretic therapy thus far.  Right leg is erythematous and warm.  This is the same leg with a small blister on her medial great toe.  She is also more alert today.  She does not appear dyspneic at this point.    I spoke with [name] to review his mother's portable echocardiogram from 02/28/19.  She's had mild to moderate aortic and mitral valve disease, trace tricuspid and pulmonary valve disease and severe pulmonary htn.  EF normal and no significant diastolic dysfunction.     Past Medical History:  Diagnosis Date  . Cholestatic hepatitis    2001  . Depression   . Fatigue   . GERD (gastroesophageal reflux disease)   . Hyperlipidemia   . Hypertension   . Insomnia   . Memory loss   . Osteoporosis   . Sciatica    Past Surgical History:  Procedure Laterality Date  . APPENDECTOMY    . CARDIAC CATHETERIZATION    . FACIAL COSMETIC SURGERY    . TONSILLECTOMY    . TOTAL ABDOMINAL HYSTERECTOMY W/ BILATERAL SALPINGOOPHORECTOMY      Allergies  Allergen Reactions  . Garlic   . Lac Bovis   . Lanolin   . Penicillins     ? rash  . Statins     myalgia    Outpatient Encounter Medications as of 03/05/2019  Medication Sig  . atenolol (TENORMIN) 50 MG tablet Take 25 mg by mouth daily.   . Calcium Carbonate-Vitamin D 600-400 MG-UNIT chew tablet Chew 1 tablet by mouth 3 (three) times daily with meals.  . carboxymethylcellulose (REFRESH PLUS) 0.5 % SOLN 1 drop 3 (three) times daily.   . Cholecalciferol (D3 HIGH POTENCY) 2000 units CAPS Take 1 capsule (2,000 Units total) by mouth daily.  Marland Kitchen donepezil (ARICEPT) 10 MG tablet Take 1 tablet (10 mg total) by mouth at bedtime.  . Estradiol 10 MCG TABS vaginal tablet Insert Vaginally five times a week  . FLUoxetine (PROZAC) 40 MG  capsule Take 40 mg by mouth daily.  . furosemide (LASIX) 40 MG tablet Take 40 mg by mouth daily.  . irbesartan (AVAPRO) 300 MG tablet Take 300 mg by mouth daily.  Marland Kitchen levothyroxine (SYNTHROID, LEVOTHROID) 25 MCG tablet Take 25 mcg by mouth at bedtime.  . Melatonin 10 MG TABS Take 1 tablet by mouth at bedtime.  . Multiple Vitamins-Minerals (PRESERVISION AREDS 2) CAPS Take 1 capsule by mouth 2 (two) times daily.  Marland Kitchen omeprazole (PRILOSEC) 20 MG capsule Take 20 mg by mouth daily.   . potassium chloride SA (K-DUR) 20 MEQ tablet Take 20 mEq by mouth daily.  . traZODone (DESYREL) 50 MG tablet Take 50 mg by mouth at bedtime.   . vitamin C (ASCORBIC ACID) 500 MG tablet Take 1 tablet (500 mg total) by  mouth daily.  . [DISCONTINUED] FLUoxetine (PROZAC) 20 MG capsule 40 mg daily.   . [DISCONTINUED] irbesartan (AVAPRO) 150 MG tablet Take 1 tablet (150 mg total) by mouth daily.   No facility-administered encounter medications on file as of 03/05/2019.     Review of Systems  Constitutional: Negative for activity change and appetite change.  HENT: Negative for congestion.   Eyes: Negative for visual disturbance.  Respiratory: Negative for chest tightness and shortness of breath.   Cardiovascular: Positive for leg swelling. Negative for chest pain and palpitations.  Genitourinary: Negative for dysuria.  Musculoskeletal: Positive for gait problem.  Neurological: Positive for tremors. Negative for dizziness and light-headedness.  Hematological: Bruises/bleeds easily.  Psychiatric/Behavioral: Positive for confusion. Negative for agitation, behavioral problems and sleep disturbance.    Immunization History  Administered Date(s) Administered  . Influenza Split 08/14/2009, 06/29/2010, 07/19/2011, 07/29/2016  . Influenza, High Dose Seasonal PF 08/08/2012, 07/26/2013, 09/17/2014, 07/31/2015  . Influenza, Quadrivalent, Recombinant, Inj, Pf 07/19/2017  . Influenza,inj,Quad PF,6+ Mos 08/16/2018  . Influenza,trivalent, recombinat, inj, PF 07/26/2016  . Pneumococcal Conjugate-13 08/21/2009  . Pneumococcal Polysaccharide-23 04/20/2018  . Td 10/13/1997  . Tdap 08/31/2006  . Zoster 06/15/2011   Pertinent  Health Maintenance Due  Topic Date Due  . INFLUENZA VACCINE  05/18/2019  . DEXA SCAN  Completed  . PNA vac Low Risk Adult  Completed   Fall Risk  10/24/2018 07/18/2018 05/02/2018  Falls in the past year? 0 Yes No  Number falls in past yr: 0 1 -  Injury with Fall? 0 No -   Functional Status Survey:    Vitals:   03/05/19 1047  BP: (!) 172/80  Pulse: 60  Resp: 16  Temp: (!) 97.4 F (36.3 C)  TempSrc: Oral  SpO2: 94%  Weight: 166 lb (75.3 kg)  Height: 5\' 9"  (1.753 m)   Body mass index  is 24.51 kg/m. Physical Exam Vitals signs reviewed.  Constitutional:      Appearance: Normal appearance.  HENT:     Head: Normocephalic and atraumatic.  Neck:     Musculoskeletal: Neck supple.  Cardiovascular:     Rate and Rhythm: Normal rate and regular rhythm.  Pulmonary:     Effort: Pulmonary effort is normal.     Breath sounds: Normal breath sounds. No rales.  Abdominal:     General: Bowel sounds are normal.  Musculoskeletal: Normal range of motion.     Comments: Tremor bilaterally and of head--has had and was labeled as essential several years ago  Skin:    Comments: Erythema of right leg, no erythema around the blister of her right medial toe, also has thick fungal nails  Neurological:  General: No focal deficit present.     Mental Status: She is alert.     Comments: Gait unsteady, using walker  Psychiatric:        Mood and Affect: Mood normal.     Labs reviewed: Recent Labs    04/19/18 0730 02/26/19 0152 03/05/19 0600  NA 144 142 139  K 3.5 3.4 3.6  BUN 26* 28* 26*  CREATININE 1.1 1.0 0.9   Recent Labs    04/19/18 0730 02/26/19 0152  AST 13 28  ALT 13 49*  ALKPHOS 77 119   Recent Labs    04/19/18 0730 02/26/19 0152  WBC 8.1 9.2  HGB 12.1 11.9*  HCT 37 35*  PLT 182 176   Lab Results  Component Value Date   TSH 2.84 10/25/2018   No results found for: HGBA1C Lab Results  Component Value Date   CHOL 244 (A) 04/19/2018   HDL 54 04/19/2018   LDLCALC 168 04/19/2018   TRIG 110 04/19/2018   Assessment/Plan 1. Cellulitis of right lower extremity -will treat with keflex 500mg  po bid for 10 days -monitor blister on right great toe for resolution -cont compression hose with regular washing of these and bathing  2. Acute congestive heart failure, unspecified heart failure type ([place]) -continue lasix which I had increased to daily and potassium daily  -f/u bmp next week  3. Aortic valve insufficiency, etiology of cardiac valve disease  unspecified -noted on portable echo done due to covid and avoiding sending people out of the community  4. Mitral valve insufficiency, unspecified etiology noted on portable echo done due to covid and avoiding sending people out of the community  5. Pulmonary hypertension ([place]) -noted to be severe -she will need a visit with cardiology in the near future -her son would like to attend--he wanted to wait a week to 10 days to schedule with covid environment -not clear why she has this as her records have not indicated any history of pulmonary disease  6. Uncontrolled hypertension -bps were remaining high and irbesartan was increased to 300mg  daily  Family/ staff Communication: discussed with [place], nurse manager, DON and spoke with her son, [name] about echo and plan  Labs/tests ordered:  Will plan to send to Dr. Ena Dawley cardiology after a week to 10 days as reviewed with Edythe Clarity L. Kitiara Hintze, D.O. [facility] [address], [place] [address] Cell Phone (Mon-Fri 8am-5pm):  [phone] On Call:  [phone] & follow prompts after 5pm & weekends Office Phone:  [phone] Office Fax:  [phone]

## 2019-03-06 DIAGNOSIS — R600 Localized edema: Secondary | ICD-10-CM | POA: Diagnosis not present

## 2019-03-06 DIAGNOSIS — M6389 Disorders of muscle in diseases classified elsewhere, multiple sites: Secondary | ICD-10-CM | POA: Diagnosis not present

## 2019-03-06 DIAGNOSIS — G250 Essential tremor: Secondary | ICD-10-CM | POA: Diagnosis not present

## 2019-03-06 DIAGNOSIS — R278 Other lack of coordination: Secondary | ICD-10-CM | POA: Diagnosis not present

## 2019-03-08 DIAGNOSIS — R600 Localized edema: Secondary | ICD-10-CM | POA: Diagnosis not present

## 2019-03-08 DIAGNOSIS — M6389 Disorders of muscle in diseases classified elsewhere, multiple sites: Secondary | ICD-10-CM | POA: Diagnosis not present

## 2019-03-08 DIAGNOSIS — R278 Other lack of coordination: Secondary | ICD-10-CM | POA: Diagnosis not present

## 2019-03-08 DIAGNOSIS — G250 Essential tremor: Secondary | ICD-10-CM | POA: Diagnosis not present

## 2019-03-08 MED ORDER — CEPHALEXIN 500 MG PO CAPS: 500.0000 mg | ORAL_CAPSULE | Freq: Two times a day (BID) | ORAL | 0 refills | Status: DC

## 2019-03-13 DIAGNOSIS — M6389 Disorders of muscle in diseases classified elsewhere, multiple sites: Secondary | ICD-10-CM | POA: Diagnosis not present

## 2019-03-13 DIAGNOSIS — G250 Essential tremor: Secondary | ICD-10-CM | POA: Diagnosis not present

## 2019-03-13 DIAGNOSIS — R600 Localized edema: Secondary | ICD-10-CM | POA: Diagnosis not present

## 2019-03-13 DIAGNOSIS — R278 Other lack of coordination: Secondary | ICD-10-CM | POA: Diagnosis not present

## 2019-03-14 DIAGNOSIS — M6389 Disorders of muscle in diseases classified elsewhere, multiple sites: Secondary | ICD-10-CM | POA: Diagnosis not present

## 2019-03-14 DIAGNOSIS — R278 Other lack of coordination: Secondary | ICD-10-CM | POA: Diagnosis not present

## 2019-03-14 DIAGNOSIS — G250 Essential tremor: Secondary | ICD-10-CM | POA: Diagnosis not present

## 2019-03-14 DIAGNOSIS — R600 Localized edema: Secondary | ICD-10-CM | POA: Diagnosis not present

## 2019-03-15 ENCOUNTER — Encounter: Payer: Self-pay | Admitting: Adult Health

## 2019-03-15 ENCOUNTER — Non-Acute Institutional Stay: Payer: Medicare Other | Admitting: Adult Health

## 2019-03-15 ENCOUNTER — Other Ambulatory Visit: Payer: Self-pay

## 2019-03-15 DIAGNOSIS — G250 Essential tremor: Secondary | ICD-10-CM | POA: Diagnosis not present

## 2019-03-15 DIAGNOSIS — M6389 Disorders of muscle in diseases classified elsewhere, multiple sites: Secondary | ICD-10-CM | POA: Diagnosis not present

## 2019-03-15 DIAGNOSIS — I272 Pulmonary hypertension, unspecified: Secondary | ICD-10-CM | POA: Diagnosis not present

## 2019-03-15 DIAGNOSIS — L03119 Cellulitis of unspecified part of limb: Secondary | ICD-10-CM

## 2019-03-15 DIAGNOSIS — R278 Other lack of coordination: Secondary | ICD-10-CM | POA: Diagnosis not present

## 2019-03-15 DIAGNOSIS — R600 Localized edema: Secondary | ICD-10-CM | POA: Diagnosis not present

## 2019-03-15 HISTORY — DX: Pulmonary hypertension, unspecified: I27.20

## 2019-03-15 NOTE — Progress Notes (Signed)
Location:  Occupational psychologist of Service:  ALF (13) Provider:   Cindi Carbon, ANP [place] [phone]  Gayland Curry, DO  Patient Care Team: Gayland Curry, DO as PCP - General (Geriatric Medicine)  Extended Emergency Contact Information Primary Emergency Contact: Su Monks Address: [address]          [place], [place] [address] Johnnette Litter of [place] Work Phone: [phone] Mobile Phone: [phone] Relation: Son Secondary Emergency Contact: Flonnie Overman Address: [address]          [place], [place] [address] Johnnette Litter of [place] Mobile Phone: [phone] Relation: Relative  Code Status:  DNR Goals of care: Advanced Directive information Advanced Directives 03/15/2019  Does Patient Have a Medical Advance Directive? Yes  Type of Paramedic of [place];Living will;Out of facility DNR (pink MOST or yellow form)  Does patient want to make changes to medical advance directive? -  Copy of [place] in Chart? Yes - validated most recent copy scanned in chart (See row information)  Pre-existing out of facility DNR order (yellow form or pink MOST form) Yellow form placed in chart (order not valid for inpatient use);Pink MOST form placed in chart (order not valid for inpatient use)     Chief Complaint  Patient presents with  . Acute Visit    f/u cellulitis    HPI:  Pt is a 85 y.o. female seen today for an acute visit for follow up regarding cellulitis. She was treated for cellulitis of bilateral lower ext right worse than left with keflex for 10 days on 5/19.  Last dose due today. Her legs have improved in terms of redness per staff and the blister that was on her right great toe is resolved. She has not had a fever, chills, pain, worsening edema, sob, cp, lack of appetite, etc. She is wearing thigh high compression hose. She is not compliant with elevation as she can not remember to do  so due to advancing dementia.  She is also on lasix daily due to a new findings of pulmonary htn on echo.  Staff report her weight has decreased and symptoms of DOE have improved.  Past Medical History:  Diagnosis Date  . Cholestatic hepatitis    2001  . Depression   . Fatigue   . GERD (gastroesophageal reflux disease)   . Hyperlipidemia   . Hypertension   . Insomnia   . Memory loss   . Osteoporosis   . Sciatica    Past Surgical History:  Procedure Laterality Date  . APPENDECTOMY    . CARDIAC CATHETERIZATION    . FACIAL COSMETIC SURGERY    . TONSILLECTOMY    . TOTAL ABDOMINAL HYSTERECTOMY W/ BILATERAL SALPINGOOPHORECTOMY      Allergies  Allergen Reactions  . Garlic   . Lac Bovis   . Lanolin   . Penicillins     ? rash  . Statins     myalgia    Outpatient Encounter Medications as of 03/15/2019  Medication Sig  . cephALEXin (KEFLEX) 500 MG capsule Take 1 capsule (500 mg total) by mouth 2 (two) times daily.  . Cholecalciferol (D3 HIGH POTENCY) 2000 units CAPS Take 1 capsule (2,000 Units total) by mouth daily.  Marland Kitchen FLUoxetine (PROZAC) 40 MG capsule Take 40 mg by mouth daily.  . furosemide (LASIX) 40 MG tablet Take 40 mg by mouth daily.  . irbesartan (AVAPRO) 300 MG tablet Take 300 mg by  mouth daily.  . potassium chloride SA (K-DUR) 20 MEQ tablet Take 20 mEq by mouth daily.  Marland Kitchen atenolol (TENORMIN) 50 MG tablet Take 25 mg by mouth daily.   . Calcium Carbonate-Vitamin D 600-400 MG-UNIT chew tablet Chew 1 tablet by mouth 3 (three) times daily with meals.  . carboxymethylcellulose (REFRESH PLUS) 0.5 % SOLN 1 drop 3 (three) times daily.   Marland Kitchen donepezil (ARICEPT) 10 MG tablet Take 1 tablet (10 mg total) by mouth at bedtime.  . Estradiol 10 MCG TABS vaginal tablet Insert Vaginally five times a week  . levothyroxine (SYNTHROID, LEVOTHROID) 25 MCG tablet Take 25 mcg by mouth at bedtime.  . Melatonin 10 MG TABS Take 1 tablet by mouth at bedtime.  . Multiple Vitamins-Minerals  (PRESERVISION AREDS 2) CAPS Take 1 capsule by mouth 2 (two) times daily.  Marland Kitchen omeprazole (PRILOSEC) 20 MG capsule Take 20 mg by mouth daily.   . traZODone (DESYREL) 50 MG tablet Take 50 mg by mouth at bedtime.   . vitamin C (ASCORBIC ACID) 500 MG tablet Take 1 tablet (500 mg total) by mouth daily.   No facility-administered encounter medications on file as of 03/15/2019.     Review of Systems  Constitutional: Negative for activity change, appetite change, chills, diaphoresis, fatigue, fever and unexpected weight change.  HENT: Negative for congestion.   Respiratory: Negative for cough, shortness of breath and wheezing.   Cardiovascular: Positive for leg swelling. Negative for chest pain and palpitations.  Gastrointestinal: Negative for abdominal distention, abdominal pain, constipation and diarrhea.  Genitourinary: Negative for difficulty urinating and dysuria.  Musculoskeletal: Positive for gait problem. Negative for arthralgias, back pain, joint swelling and myalgias.  Skin: Positive for rash.  Neurological: Positive for tremors. Negative for dizziness, seizures, syncope, facial asymmetry, speech difficulty, weakness, light-headedness, numbness and headaches.  Psychiatric/Behavioral: Positive for confusion. Negative for agitation and behavioral problems.    Immunization History  Administered Date(s) Administered  . Influenza Split 08/14/2009, 06/29/2010, 07/19/2011, 07/29/2016  . Influenza, High Dose Seasonal PF 08/08/2012, 07/26/2013, 09/17/2014, 07/31/2015  . Influenza, Quadrivalent, Recombinant, Inj, Pf 07/19/2017  . Influenza,inj,Quad PF,6+ Mos 08/16/2018  . Influenza,trivalent, recombinat, inj, PF 07/26/2016  . Pneumococcal Conjugate-13 08/21/2009  . Pneumococcal Polysaccharide-23 04/20/2018  . Td 10/13/1997  . Tdap 08/31/2006  . Zoster 06/15/2011   Pertinent  Health Maintenance Due  Topic Date Due  . INFLUENZA VACCINE  05/18/2019  . DEXA SCAN  Completed  . PNA vac Low Risk  Adult  Completed   Fall Risk  10/24/2018 07/18/2018 05/02/2018  Falls in the past year? 0 Yes No  Number falls in past yr: 0 1 -  Injury with Fall? 0 No -   Functional Status Survey:    Vitals:   03/15/19 1009  BP: (!) 142/64  Pulse: 62  Resp: 20  Temp: 98.1 F (36.7 C)  SpO2: 94%  Weight: 159 lb 12.8 oz (72.5 kg)   Body mass index is 23.6 kg/m. Physical Exam Vitals signs and nursing note reviewed.  Constitutional:      General: She is not in acute distress.    Appearance: She is not diaphoretic.  HENT:     Head: Normocephalic and atraumatic.  Neck:     Vascular: No JVD.  Cardiovascular:     Rate and Rhythm: Normal rate and regular rhythm.     Heart sounds: No murmur.  Pulmonary:     Effort: Pulmonary effort is normal. No respiratory distress.     Breath sounds: Normal breath  sounds. No wheezing.  Abdominal:     General: Bowel sounds are normal. There is no distension.     Palpations: Abdomen is soft.     Tenderness: There is no abdominal tenderness.  Musculoskeletal:     Right lower leg: Edema present.     Left lower leg: Edema present.  Skin:    General: Skin is warm and dry.     Findings: Erythema (noted to BLE but improved. No drainage or blisters noted. Some warmth noted. to both legs. ) present.     Comments: No adenopathy of groin or red streaking   Neurological:     General: No focal deficit present.     Mental Status: She is alert. Mental status is at baseline.     Comments: Tremor noted to head and arms.   Psychiatric:        Mood and Affect: Mood normal.     Labs reviewed: Recent Labs    04/19/18 0730 02/26/19 0152 03/05/19 0600  NA 144 142 139  K 3.5 3.4 3.6  BUN 26* 28* 26*  CREATININE 1.1 1.0 0.9   Recent Labs    04/19/18 0730 02/26/19 0152  AST 13 28  ALT 13 49*  ALKPHOS 77 119   Recent Labs    04/19/18 0730 02/26/19 0152  WBC 8.1 9.2  HGB 12.1 11.9*  HCT 37 35*  PLT 182 176   Lab Results  Component Value Date   TSH  2.84 10/25/2018   No results found for: HGBA1C Lab Results  Component Value Date   CHOL 244 (A) 04/19/2018   HDL 54 04/19/2018   LDLCALC 168 04/19/2018   TRIG 110 04/19/2018    Significant Diagnostic Results in last 30 days:  No results found.  Assessment/Plan 1. Cellulitis of lower extremity, unspecified laterality  She continues to have some mild erythema and warmth to bilateral lower extremities but it is improved. She shows no signs of toxicity. Her legs appear to be developing some venous stasis dermatitis with red dry scaly skin and itching. Will prescribe triamcinolone 0.1% bid x 1 week and continue to encourage compression hose and elevation. The staff will mark the current area of redness with a marker and report to [facility] if symptoms worsen. This area could take several more days to weeks to resolve but may evolve into chronic venous stasis issues.    2. Pulmonary HTN (HCC)  Improved weight, shortness of breath, and edema Continue Lasix 40 mg qd and Diovan 300 mg qd  2 d echo 02/28/19 EF 55-60 %, mild to moderate mitral valve regurg, mild tricuspid regurg, severely elevated estimated right ventricular systolic pressure 85 mm Hg, and mild to moderate aortic valve regurg. No diastolic CHF noted.     Family/ staff Communication: staff  Labs/tests ordered:  NA

## 2019-03-19 DIAGNOSIS — R600 Localized edema: Secondary | ICD-10-CM | POA: Diagnosis not present

## 2019-03-19 DIAGNOSIS — I5089 Other heart failure: Secondary | ICD-10-CM | POA: Diagnosis not present

## 2019-03-19 DIAGNOSIS — G250 Essential tremor: Secondary | ICD-10-CM | POA: Diagnosis not present

## 2019-03-19 DIAGNOSIS — R278 Other lack of coordination: Secondary | ICD-10-CM | POA: Diagnosis not present

## 2019-03-19 DIAGNOSIS — G301 Alzheimer's disease with late onset: Secondary | ICD-10-CM | POA: Diagnosis not present

## 2019-03-19 DIAGNOSIS — M6389 Disorders of muscle in diseases classified elsewhere, multiple sites: Secondary | ICD-10-CM | POA: Diagnosis not present

## 2019-03-20 ENCOUNTER — Non-Acute Institutional Stay: Payer: Medicare Other | Admitting: Internal Medicine

## 2019-03-20 ENCOUNTER — Other Ambulatory Visit: Payer: Self-pay

## 2019-03-20 ENCOUNTER — Encounter: Payer: Self-pay | Admitting: Internal Medicine

## 2019-03-20 VITALS — BP 136/70 | HR 68 | Temp 97.8°F | Ht 69.0 in | Wt 160.0 lb

## 2019-03-20 DIAGNOSIS — L03115 Cellulitis of right lower limb: Secondary | ICD-10-CM

## 2019-03-20 DIAGNOSIS — R278 Other lack of coordination: Secondary | ICD-10-CM | POA: Diagnosis not present

## 2019-03-20 DIAGNOSIS — G301 Alzheimer's disease with late onset: Secondary | ICD-10-CM | POA: Diagnosis not present

## 2019-03-20 DIAGNOSIS — B353 Tinea pedis: Secondary | ICD-10-CM | POA: Diagnosis not present

## 2019-03-20 DIAGNOSIS — I5089 Other heart failure: Secondary | ICD-10-CM | POA: Diagnosis not present

## 2019-03-20 DIAGNOSIS — I872 Venous insufficiency (chronic) (peripheral): Secondary | ICD-10-CM

## 2019-03-20 DIAGNOSIS — G250 Essential tremor: Secondary | ICD-10-CM | POA: Diagnosis not present

## 2019-03-20 DIAGNOSIS — F0281 Dementia in other diseases classified elsewhere with behavioral disturbance: Secondary | ICD-10-CM | POA: Diagnosis not present

## 2019-03-20 DIAGNOSIS — M6389 Disorders of muscle in diseases classified elsewhere, multiple sites: Secondary | ICD-10-CM | POA: Diagnosis not present

## 2019-03-20 DIAGNOSIS — I351 Nonrheumatic aortic (valve) insufficiency: Secondary | ICD-10-CM | POA: Diagnosis not present

## 2019-03-20 DIAGNOSIS — I272 Pulmonary hypertension, unspecified: Secondary | ICD-10-CM | POA: Diagnosis not present

## 2019-03-20 DIAGNOSIS — I340 Nonrheumatic mitral (valve) insufficiency: Secondary | ICD-10-CM | POA: Diagnosis not present

## 2019-03-20 DIAGNOSIS — F02818 Dementia in other diseases classified elsewhere, unspecified severity, with other behavioral disturbance: Secondary | ICD-10-CM

## 2019-03-20 DIAGNOSIS — R600 Localized edema: Secondary | ICD-10-CM | POA: Diagnosis not present

## 2019-03-20 NOTE — Progress Notes (Signed)
Location:  Occupational psychologist of Service:  Clinic (12)  Provider: Merina Behrendt L. Mariea Clonts, D.O., C.M.D.  Code Status: DNR; MOST Goals of Care:  Advanced Directives 03/15/2019  Does Patient Have a Medical Advance Directive? Yes  Type of Paramedic of [place];Living will;Out of facility DNR (pink MOST or yellow form)  Does patient want to make changes to medical advance directive? -  Copy of [place] in Chart? Yes - validated most recent copy scanned in chart (See row information)  Pre-existing out of facility DNR order (yellow form or pink MOST form) Yellow form placed in chart (order not valid for inpatient use);Pink MOST form placed in chart (order not valid for inpatient use)     Chief Complaint  Patient presents with  . Medical Management of Chronic Issues    3 week follow-up    HPI: Patient is a 85 y.o. female with dementia with paranoid delusions, hoarding, depression, htn seen today for medical management of chronic diseases and f/u on breathing, edema, echocardiogram and RLE cellulitis.  Echo had shown 02/28/19 EF 55-60 %, mild to moderate mitral valve regurg, mild tricuspid regurg, severely elevated estimated right ventricular systolic pressure 85 mm Hg, and mild to moderate aortic valve regurg. No diastolic CHF noted.   Sats 89% after walking down to clinic from assisted living and improved to 91% on RA after resting a couple of minutes.    Right hand tremor worse today, very rapid at rest.  She also struggles to apply her makeup due to this.  She has a head tremor, as well.  She has family history of essential tremors.   Right leg erythema increased yesterday and nursing concerned her cellulitis is back.  Upon removing stockings live here today, there is slight darkening of skin beneath the sharpie lines vs above, but no erythema, warmth or tenderness.  She has been getting the triamcinolone since 5/29 when seen by NP  after her erythema did not fully resolve with cellulitis treatment.  She does still have scaliness of her does and feet and medial legs right greater than left.  She has edema of both legs but right is considerably larger.    Past Medical History:  Diagnosis Date  . Cholestatic hepatitis    2001  . Depression   . Fatigue   . GERD (gastroesophageal reflux disease)   . Hyperlipidemia   . Hypertension   . Insomnia   . Memory loss   . Osteoporosis   . Sciatica     Past Surgical History:  Procedure Laterality Date  . APPENDECTOMY    . CARDIAC CATHETERIZATION    . FACIAL COSMETIC SURGERY    . TONSILLECTOMY    . TOTAL ABDOMINAL HYSTERECTOMY W/ BILATERAL SALPINGOOPHORECTOMY      Allergies  Allergen Reactions  . Garlic   . Lac Bovis   . Lanolin   . Penicillins     ? rash  . Statins     myalgia    Outpatient Encounter Medications as of 03/20/2019  Medication Sig  . atenolol (TENORMIN) 50 MG tablet Take 25 mg by mouth daily.   . Calcium Carbonate-Vitamin D 600-400 MG-UNIT chew tablet Chew 1 tablet by mouth 3 (three) times daily with meals.  . carboxymethylcellulose (REFRESH PLUS) 0.5 % SOLN 1 drop 3 (three) times daily.   . Cholecalciferol (D3 HIGH POTENCY) 2000 units CAPS Take 1 capsule (2,000 Units total) by mouth daily.  Marland Kitchen donepezil (ARICEPT)  10 MG tablet Take 1 tablet (10 mg total) by mouth at bedtime.  . Estradiol 10 MCG TABS vaginal tablet Insert Vaginally five times a week  . FLUoxetine (PROZAC) 40 MG capsule Take 40 mg by mouth daily.  . furosemide (LASIX) 40 MG tablet Take 40 mg by mouth daily.  . irbesartan (AVAPRO) 300 MG tablet Take 300 mg by mouth daily.  Marland Kitchen levothyroxine (SYNTHROID, LEVOTHROID) 25 MCG tablet Take 25 mcg by mouth at bedtime.  . Melatonin 10 MG TABS Take 1 tablet by mouth at bedtime.  . Multiple Vitamins-Minerals (PRESERVISION AREDS 2) CAPS Take 1 capsule by mouth 2 (two) times daily.  Marland Kitchen omeprazole (PRILOSEC) 20 MG capsule Take 20 mg by mouth daily.     . potassium chloride SA (K-DUR) 20 MEQ tablet Take 20 mEq by mouth daily.  . traZODone (DESYREL) 50 MG tablet Take 50 mg by mouth at bedtime.   . vitamin C (ASCORBIC ACID) 500 MG tablet Take 1 tablet (500 mg total) by mouth daily.  . [DISCONTINUED] cephALEXin (KEFLEX) 500 MG capsule Take 1 capsule (500 mg total) by mouth 2 (two) times daily.   No facility-administered encounter medications on file as of 03/20/2019.     Review of Systems:  Review of Systems  Constitutional: Negative for chills, fever and malaise/fatigue.  HENT: Positive for hearing loss.   Eyes: Negative for blurred vision.  Respiratory: Positive for shortness of breath. Negative for cough and wheezing.   Cardiovascular: Positive for leg swelling. Negative for chest pain, palpitations, orthopnea and PND.       Dyspnea on exertion visible but pt does not acknowledge (dementia)  Gastrointestinal: Negative for abdominal pain, blood in stool, constipation, diarrhea and melena.  Genitourinary: Negative for dysuria.  Musculoskeletal: Negative for falls and joint pain.  Skin: Negative for itching and rash.       Tinea of feet and lower legs; fungal toenails  Neurological: Positive for tremors. Negative for dizziness and loss of consciousness.  Endo/Heme/Allergies: Does not bruise/bleed easily.  Psychiatric/Behavioral: Positive for memory loss. Negative for depression. The patient is not nervous/anxious and does not have insomnia.     Health Maintenance  Topic Date Due  . TETANUS/TDAP  08/31/2016  . INFLUENZA VACCINE  05/18/2019  . DEXA SCAN  Completed  . PNA vac Low Risk Adult  Completed    Physical Exam: Vitals:   03/20/19 1357  BP: 136/70  Pulse: 68  Temp: 97.8 F (36.6 C)  TempSrc: Oral  SpO2: 91%  Weight: 160 lb (72.6 kg)  Height: 5\' 9"  (1.753 m)   Body mass index is 23.63 kg/m. Physical Exam Vitals signs reviewed.  Constitutional:      General: She is not in acute distress.    Appearance: She is not  ill-appearing or toxic-appearing.  HENT:     Head: Normocephalic and atraumatic.  Cardiovascular:     Rate and Rhythm: Normal rate and regular rhythm.     Pulses: Normal pulses.     Heart sounds: Normal heart sounds.  Pulmonary:     Effort: Pulmonary effort is normal.     Breath sounds: Normal breath sounds. No rales.     Comments: Did appear dyspneic when arrived after walking a long distance Abdominal:     General: Bowel sounds are normal.     Palpations: Abdomen is soft.  Musculoskeletal: Normal range of motion.     Comments: Right sided resting and action tremor, head tremor  Skin:    General: Skin  is warm and dry.     Capillary Refill: Capillary refill takes less than 2 seconds.     Comments: Slight brownish color of lower legs, dry scaly legs and feet   Neurological:     General: No focal deficit present.     Mental Status: She is alert.     Comments: Oriented to person and does remember I'm the doctor; needs help finding way around facility  Psychiatric:        Mood and Affect: Mood normal.     Labs reviewed: Basic Metabolic Panel: Recent Labs    04/19/18 0730 10/25/18 02/26/19 0152 03/05/19 0600  NA 144  --  142 139  K 3.5  --  3.4 3.6  BUN 26*  --  28* 26*  CREATININE 1.1  --  1.0 0.9  TSH 5.89 2.84  --   --    Liver Function Tests: Recent Labs    04/19/18 0730 02/26/19 0152  AST 13 28  ALT 13 49*  ALKPHOS 77 119   No results for input(s): LIPASE, AMYLASE in the last 8760 hours. No results for input(s): AMMONIA in the last 8760 hours. CBC: Recent Labs    04/19/18 0730 02/26/19 0152  WBC 8.1 9.2  HGB 12.1 11.9*  HCT 37 35*  PLT 182 176   Lipid Panel: Recent Labs    04/19/18 0730  CHOL 244*  HDL 54  LDLCALC 168  TRIG 110   No results found for: HGBA1C  Procedures since last visit: No results found.  Assessment/Plan 1. Pulmonary hypertension ([place]) -estimated as severe on portable echo -we have begun the lasix and potassium for her  chf which did improve her sats, dypsnea and edema, but sats remain lower than norm with exertion and she still has some edema -referred to cardiology to optimize meds--goals are comfort-based so pt's family does not want aggressive testing  2. Aortic valve insufficiency, etiology of cardiac valve disease unspecified -noted on echo as mild to mod -not previously known, unsure how much contributing to her chf/symptoms  3. Mitral valve insufficiency, unspecified etiology -also found on echo, await cardiology insights  4. Cellulitis of right lower extremity -appears this resolved with the keflex -still high risk with tinea and with her need for compression hose and her onychomycosis  5. Late onset Alzheimer's disease with behavioral disturbance ([place]) -memory remains poor, ideally would benefit from memory care setting -currently in AL with extra assistance  6. Tinea pedis of both feet -will plan to continue the triamcinolone for another couple of weeks but does look better and then prn  7. Chronic venous insufficiency -ongoing, now using thigh high compression with benefit -due to asymmetry, will check right lower extremity venous doppler  Labs/tests ordered:  Cardiology referral placed in AL chart to maximize med mgt as pt still dyspneic and hypoxic with exertion though her edema is improved and she's on regular lasix and potassium; also right venous doppler ordered  Next appt:  6 wks in [place]. Surena Welge, D.O. [facility] [address], [place] [address] Cell Phone (Mon-Fri 8am-5pm):  [phone] On Call:  [phone] & follow prompts after 5pm & weekends Office Phone:  [phone] Office Fax:  [phone]

## 2019-03-21 DIAGNOSIS — R609 Edema, unspecified: Secondary | ICD-10-CM | POA: Diagnosis not present

## 2019-03-22 DIAGNOSIS — M6389 Disorders of muscle in diseases classified elsewhere, multiple sites: Secondary | ICD-10-CM | POA: Diagnosis not present

## 2019-03-22 DIAGNOSIS — G250 Essential tremor: Secondary | ICD-10-CM | POA: Diagnosis not present

## 2019-03-22 DIAGNOSIS — I5089 Other heart failure: Secondary | ICD-10-CM | POA: Diagnosis not present

## 2019-03-22 DIAGNOSIS — R600 Localized edema: Secondary | ICD-10-CM | POA: Diagnosis not present

## 2019-03-22 DIAGNOSIS — R278 Other lack of coordination: Secondary | ICD-10-CM | POA: Diagnosis not present

## 2019-03-22 DIAGNOSIS — G301 Alzheimer's disease with late onset: Secondary | ICD-10-CM | POA: Diagnosis not present

## 2019-03-25 ENCOUNTER — Telehealth: Payer: Medicare Other | Admitting: Cardiology

## 2019-03-27 ENCOUNTER — Other Ambulatory Visit: Payer: Self-pay

## 2019-03-27 ENCOUNTER — Telehealth: Payer: Medicare Other | Admitting: Cardiology

## 2019-03-27 DIAGNOSIS — Z20828 Contact with and (suspected) exposure to other viral communicable diseases: Secondary | ICD-10-CM | POA: Diagnosis not present

## 2019-04-01 DIAGNOSIS — G250 Essential tremor: Secondary | ICD-10-CM | POA: Diagnosis not present

## 2019-04-01 DIAGNOSIS — I5089 Other heart failure: Secondary | ICD-10-CM | POA: Diagnosis not present

## 2019-04-01 DIAGNOSIS — R600 Localized edema: Secondary | ICD-10-CM | POA: Diagnosis not present

## 2019-04-01 DIAGNOSIS — M6389 Disorders of muscle in diseases classified elsewhere, multiple sites: Secondary | ICD-10-CM | POA: Diagnosis not present

## 2019-04-01 DIAGNOSIS — G301 Alzheimer's disease with late onset: Secondary | ICD-10-CM | POA: Diagnosis not present

## 2019-04-01 DIAGNOSIS — R278 Other lack of coordination: Secondary | ICD-10-CM | POA: Diagnosis not present

## 2019-04-03 DIAGNOSIS — I5089 Other heart failure: Secondary | ICD-10-CM | POA: Diagnosis not present

## 2019-04-03 DIAGNOSIS — R600 Localized edema: Secondary | ICD-10-CM | POA: Diagnosis not present

## 2019-04-03 DIAGNOSIS — G250 Essential tremor: Secondary | ICD-10-CM | POA: Diagnosis not present

## 2019-04-03 DIAGNOSIS — G301 Alzheimer's disease with late onset: Secondary | ICD-10-CM | POA: Diagnosis not present

## 2019-04-03 DIAGNOSIS — M6389 Disorders of muscle in diseases classified elsewhere, multiple sites: Secondary | ICD-10-CM | POA: Diagnosis not present

## 2019-04-03 DIAGNOSIS — R278 Other lack of coordination: Secondary | ICD-10-CM | POA: Diagnosis not present

## 2019-04-05 ENCOUNTER — Ambulatory Visit (INDEPENDENT_AMBULATORY_CARE_PROVIDER_SITE_OTHER): Payer: Medicare Other | Admitting: Cardiology

## 2019-04-05 ENCOUNTER — Encounter: Payer: Self-pay | Admitting: Cardiology

## 2019-04-05 ENCOUNTER — Other Ambulatory Visit: Payer: Self-pay

## 2019-04-05 VITALS — BP 140/60 | HR 58 | Ht 69.0 in | Wt 155.0 lb

## 2019-04-05 DIAGNOSIS — I447 Left bundle-branch block, unspecified: Secondary | ICD-10-CM

## 2019-04-05 DIAGNOSIS — I272 Pulmonary hypertension, unspecified: Secondary | ICD-10-CM

## 2019-04-05 DIAGNOSIS — G250 Essential tremor: Secondary | ICD-10-CM | POA: Diagnosis not present

## 2019-04-05 DIAGNOSIS — M6389 Disorders of muscle in diseases classified elsewhere, multiple sites: Secondary | ICD-10-CM | POA: Diagnosis not present

## 2019-04-05 DIAGNOSIS — I5089 Other heart failure: Secondary | ICD-10-CM | POA: Diagnosis not present

## 2019-04-05 DIAGNOSIS — G301 Alzheimer's disease with late onset: Secondary | ICD-10-CM | POA: Diagnosis not present

## 2019-04-05 DIAGNOSIS — R600 Localized edema: Secondary | ICD-10-CM | POA: Diagnosis not present

## 2019-04-05 DIAGNOSIS — R278 Other lack of coordination: Secondary | ICD-10-CM | POA: Diagnosis not present

## 2019-04-05 NOTE — Progress Notes (Signed)
Cardiology Office Note:    Date:  04/05/2019   ID:  Westley Hummer, DOB 1924/04/13, MRN [id number]  PCP:  Gayland Curry, DO  Cardiologist:  Candee Furbish, MD  Electrophysiologist:  None   Referring MD: Gayland Curry, DO   Evaluation of pulmonary hypertension seen on echocardiogram performed at [facility]  History of Present Illness:    April Stevenson is a 85 y.o. female here for the elevated pulmonary pressures, severe, seen on echocardiogram performed at [facility].  Left bundle branch block chronic.  Fairly recent chest x-ray was normal.  No significant shortness of breath.  Main symptom is lower extremity edema.  Lower extremity Dopplers were negative for DVT.  She did take a short course of antibiotic for cellulitis.  She was given Lasix 40 mg a day with potassium supplementation and Avapro was increased.  Her blood pressure today was 140/60.  Excellent.  I did not see any JVD on exam.  Her lower extremity edema has improved.  Her son was present to help with assessment.  Past Medical History:  Diagnosis Date  . Benign essential tremor 07/18/2018  . Bilateral nonexudative age-related macular degeneration 07/18/2018  . Cholestatic hepatitis    2001  . DDD (degenerative disc disease), lumbar 04/23/2018  . Depression   . Dry eyes, bilateral 07/18/2018  . Fatigue   . GERD (gastroesophageal reflux disease)   . Hiatal hernia with GERD 07/29/2011  . Hoarding behavior 04/23/2018  . HTN (hypertension) 07/29/2011  . Hyperlipidemia   . Hypertension   . Hypothyroidism 07/29/2011  . Insomnia   . Late onset Alzheimer's disease with behavioral disturbance ([place]) 04/23/2018  . LBBB (left bundle branch block) 07/29/2011  . Major depressive disorder 07/29/2016  . Memory impairment 12/09/2016  . Memory loss   . Osteoporosis   . Paranoid delusion ([place]) 07/18/2018  . Psychophysiological insomnia 04/23/2018  . Pulmonary HTN ([place]) 03/15/2019   2 d echo 02/28/19 EF 55-60 %, mild to  moderate mitral valve regurg, mild tricuspid regurg, severely elevated estimated right ventricular systolic pressure 85 mm Hg, and mild to moderate aortic valve regurg. No diastolic CHF noted.   . Pure hypercholesterolemia 07/29/2011  . Sciatica   . Senile osteoporosis 04/23/2018    Past Surgical History:  Procedure Laterality Date  . APPENDECTOMY    . CARDIAC CATHETERIZATION    . FACIAL COSMETIC SURGERY    . TONSILLECTOMY    . TOTAL ABDOMINAL HYSTERECTOMY W/ BILATERAL SALPINGOOPHORECTOMY      Current Medications: Current Meds  Medication Sig  . atenolol (TENORMIN) 50 MG tablet Take 25 mg by mouth daily.   . Calcium Carbonate-Vitamin D 600-400 MG-UNIT chew tablet Chew 1 tablet by mouth 3 (three) times daily with meals.  . carboxymethylcellulose (REFRESH PLUS) 0.5 % SOLN 1 drop 3 (three) times daily.   . Cholecalciferol (D3 HIGH POTENCY) 2000 units CAPS Take 1 capsule (2,000 Units total) by mouth daily.  Marland Kitchen donepezil (ARICEPT) 10 MG tablet Take 1 tablet (10 mg total) by mouth at bedtime.  . Estradiol 10 MCG TABS vaginal tablet Insert Vaginally five times a week  . FLUoxetine (PROZAC) 40 MG capsule Take 40 mg by mouth daily.  . furosemide (LASIX) 40 MG tablet Take 40 mg by mouth daily.  . irbesartan (AVAPRO) 300 MG tablet Take 300 mg by mouth daily.  Marland Kitchen levothyroxine (SYNTHROID, LEVOTHROID) 25 MCG tablet Take 25 mcg by mouth at bedtime.  . Melatonin 10 MG TABS Take 1 tablet by mouth at bedtime.  Marland Kitchen  Multiple Vitamins-Minerals (PRESERVISION AREDS 2) CAPS Take 1 capsule by mouth 2 (two) times daily.  Marland Kitchen omeprazole (PRILOSEC) 20 MG capsule Take 20 mg by mouth daily.   . potassium chloride SA (K-DUR) 20 MEQ tablet Take 20 mEq by mouth daily.  . traZODone (DESYREL) 50 MG tablet Take 50 mg by mouth at bedtime.   . vitamin C (ASCORBIC ACID) 500 MG tablet Take 1 tablet (500 mg total) by mouth daily.     Allergies:   Garlic, Lac bovis, Lanolin, Penicillins, and Statins   Social History    Socioeconomic History  . Marital status: Widowed    Spouse name: Mikki Santee  . Number of children: 1  . Years of education: some college, took courses as adult also  . Highest education level: Some college, no degree  Occupational History  . Occupation: model    Comment: Location manager  . Occupation: singer  Social Needs  . Financial resource strain: Not hard at all  . Food insecurity    Worry: Never true    Inability: Never true  . Transportation needs    Medical: No    Non-medical: No  Tobacco Use  . Smoking status: Former Smoker    Packs/day: 0.50    Years: 20.00    Pack years: 10.00    Types: Cigarettes  . Smokeless tobacco: Never Used  Substance and Sexual Activity  . Alcohol use: Not Currently    Alcohol/week: 1.0 standard drinks    Types: 1 Shots of liquor per week    Comment: nightly most nights  . Drug use: Never  . Sexual activity: Not Currently    Partners: Male  Lifestyle  . Physical activity    Days per week: 0 days    Minutes per session: 0 min  . Stress: Only a little  Relationships  . Social Herbalist on phone: Three times a week    Gets together: Once a week    Attends religious service: Never    Active member of club or organization: No    Attends meetings of clubs or organizations: Never    Relationship status: Widowed  Other Topics Concern  . Not on file  Social History Narrative   Was married three times and outlived all three husbands.     Family History: The patient's family history includes Heart attack in her father.  ROS:   Please see the history of present illness.    Denies any fevers cough chills syncope.  All other systems reviewed and are negative.  EKGs/Labs/Other Studies Reviewed:    The following studies were reviewed today: Echocardiogram report reviewed.  Prior records from care everywhere, cardiology visit in 2017 reviewed, unremarkable.  Prior x-ray reports reviewed, reassuring.  No signs of RV enlargement.   EKG: Chronic left bundle branch block.  Recent Labs: 10/25/2018: TSH 2.84 02/26/2019: ALT 49; Hemoglobin 11.9; Platelets 176 03/05/2019: BUN 26; Creatinine 0.9; Potassium 3.6; Sodium 139  Recent Lipid Panel    Component Value Date/Time   CHOL 244 (A) 04/19/2018 0730   TRIG 110 04/19/2018 0730   HDL 54 04/19/2018 0730   LDLCALC 168 04/19/2018 0730    Physical Exam:    VS:  BP 140/60   Pulse (!) 58   Ht 5\' 9"  (1.753 m)   Wt 155 lb (70.3 kg)   SpO2 94%   BMI 22.89 kg/m     Wt Readings from Last 3 Encounters:  04/05/19 155 lb (70.3 kg)  03/20/19 160 lb (72.6 kg)  03/15/19 159 lb 12.8 oz (72.5 kg)     GEN:  Well nourished, well developed in no acute distress HEENT: Normal NECK: No JVD; No carotid bruits LYMPHATICS: No lymphadenopathy CARDIAC: RRR, no murmurs, rubs, gallops RESPIRATORY:  Clear to auscultation without rales, wheezing or rhonchi  ABDOMEN: Soft, non-tender, non-distended MUSCULOSKELETAL:  2+ BLE pitting edema; No deformity  SKIN: Warm and dry NEUROLOGIC: Alert.  Pleasant.  Memory impairment noted.  Resting tremor. PSYCHIATRIC:  Normal affect   ASSESSMENT:    No diagnosis found. PLAN:    In order of problems listed above:  Pulmonary hypertension - Estimated pressures in the 80s on echocardiogram.  Right ventricle and right atrium however were both reported as normal in size.  I do not see any JVD on exam.  The only physical finding is lower extremity edema bilaterally that could go along with elevated right-sided pressure.  I wonder if there was potential for continuous-wave Doppler signal in the mitral valve region to be recorded as tricuspid valve hence the elevated pressures.  Also, he would expect to see some evidence of cardiomegaly or right-sided enlargement over several years if pulmonary pressures were gradually elevating.  This does sometimes happen.  Explained to son.  However, if these are accurate data, I would not change any treatment strategy and  agree with current excellent plan. - Continue with Lasix, potassium supplementation with occasional basic metabolic profile.  She has seen improvement with lower extremity edema.  Essential hypertension - Continue with increase in Erbe Sartain 300 mg.  Excellent.  Left bundle branch block -Chronic  DNR Continue with current plan.  Medication Adjustments/Labs and Tests Ordered: Current medicines are reviewed at length with the patient today.  Concerns regarding medicines are outlined above.  No orders of the defined types were placed in this encounter.  No orders of the defined types were placed in this encounter.   Patient Instructions  Medication Instructions:  The current medical regimen is effective;  continue present plan and medications.  If you need a refill on your cardiac medications before your next appointment, please call your pharmacy.   Follow-Up: Follow up as needed with Dr Marlou Porch.  Thank you for choosing [facility]!!        Signed, Candee Furbish, MD  04/05/2019 5:10 PM    [facility]

## 2019-04-05 NOTE — Patient Instructions (Signed)
Medication Instructions:  The current medical regimen is effective;  continue present plan and medications.  If you need a refill on your cardiac medications before your next appointment, please call your pharmacy.   Follow-Up: Follow up as needed with Dr Skains.  Thank you for choosing [facility]!!      

## 2019-04-09 DIAGNOSIS — I5089 Other heart failure: Secondary | ICD-10-CM | POA: Diagnosis not present

## 2019-04-09 DIAGNOSIS — R600 Localized edema: Secondary | ICD-10-CM | POA: Diagnosis not present

## 2019-04-09 DIAGNOSIS — M6389 Disorders of muscle in diseases classified elsewhere, multiple sites: Secondary | ICD-10-CM | POA: Diagnosis not present

## 2019-04-09 DIAGNOSIS — R278 Other lack of coordination: Secondary | ICD-10-CM | POA: Diagnosis not present

## 2019-04-09 DIAGNOSIS — G301 Alzheimer's disease with late onset: Secondary | ICD-10-CM | POA: Diagnosis not present

## 2019-04-09 DIAGNOSIS — G250 Essential tremor: Secondary | ICD-10-CM | POA: Diagnosis not present

## 2019-04-10 DIAGNOSIS — M6389 Disorders of muscle in diseases classified elsewhere, multiple sites: Secondary | ICD-10-CM | POA: Diagnosis not present

## 2019-04-10 DIAGNOSIS — G250 Essential tremor: Secondary | ICD-10-CM | POA: Diagnosis not present

## 2019-04-10 DIAGNOSIS — R278 Other lack of coordination: Secondary | ICD-10-CM | POA: Diagnosis not present

## 2019-04-10 DIAGNOSIS — G301 Alzheimer's disease with late onset: Secondary | ICD-10-CM | POA: Diagnosis not present

## 2019-04-10 DIAGNOSIS — R600 Localized edema: Secondary | ICD-10-CM | POA: Diagnosis not present

## 2019-04-10 DIAGNOSIS — I5089 Other heart failure: Secondary | ICD-10-CM | POA: Diagnosis not present

## 2019-04-15 DIAGNOSIS — G250 Essential tremor: Secondary | ICD-10-CM | POA: Diagnosis not present

## 2019-04-15 DIAGNOSIS — I5089 Other heart failure: Secondary | ICD-10-CM | POA: Diagnosis not present

## 2019-04-15 DIAGNOSIS — R278 Other lack of coordination: Secondary | ICD-10-CM | POA: Diagnosis not present

## 2019-04-15 DIAGNOSIS — G301 Alzheimer's disease with late onset: Secondary | ICD-10-CM | POA: Diagnosis not present

## 2019-04-15 DIAGNOSIS — R600 Localized edema: Secondary | ICD-10-CM | POA: Diagnosis not present

## 2019-04-15 DIAGNOSIS — M6389 Disorders of muscle in diseases classified elsewhere, multiple sites: Secondary | ICD-10-CM | POA: Diagnosis not present

## 2019-04-17 DIAGNOSIS — M6389 Disorders of muscle in diseases classified elsewhere, multiple sites: Secondary | ICD-10-CM | POA: Diagnosis not present

## 2019-04-17 DIAGNOSIS — G250 Essential tremor: Secondary | ICD-10-CM | POA: Diagnosis not present

## 2019-04-17 DIAGNOSIS — R278 Other lack of coordination: Secondary | ICD-10-CM | POA: Diagnosis not present

## 2019-04-17 DIAGNOSIS — R600 Localized edema: Secondary | ICD-10-CM | POA: Diagnosis not present

## 2019-04-22 DIAGNOSIS — G250 Essential tremor: Secondary | ICD-10-CM | POA: Diagnosis not present

## 2019-04-22 DIAGNOSIS — R600 Localized edema: Secondary | ICD-10-CM | POA: Diagnosis not present

## 2019-04-22 DIAGNOSIS — M6389 Disorders of muscle in diseases classified elsewhere, multiple sites: Secondary | ICD-10-CM | POA: Diagnosis not present

## 2019-04-22 DIAGNOSIS — R278 Other lack of coordination: Secondary | ICD-10-CM | POA: Diagnosis not present

## 2019-04-23 ENCOUNTER — Non-Acute Institutional Stay (SKILLED_NURSING_FACILITY): Payer: Medicare Other | Admitting: Internal Medicine

## 2019-04-23 ENCOUNTER — Encounter: Payer: Self-pay | Admitting: Internal Medicine

## 2019-04-23 DIAGNOSIS — F22 Delusional disorders: Secondary | ICD-10-CM | POA: Diagnosis not present

## 2019-04-23 DIAGNOSIS — G301 Alzheimer's disease with late onset: Secondary | ICD-10-CM

## 2019-04-23 DIAGNOSIS — F02818 Dementia in other diseases classified elsewhere, unspecified severity, with other behavioral disturbance: Secondary | ICD-10-CM

## 2019-04-23 DIAGNOSIS — M6389 Disorders of muscle in diseases classified elsewhere, multiple sites: Secondary | ICD-10-CM | POA: Diagnosis not present

## 2019-04-23 DIAGNOSIS — I10 Essential (primary) hypertension: Secondary | ICD-10-CM

## 2019-04-23 DIAGNOSIS — F0281 Dementia in other diseases classified elsewhere with behavioral disturbance: Secondary | ICD-10-CM | POA: Diagnosis not present

## 2019-04-23 DIAGNOSIS — G250 Essential tremor: Secondary | ICD-10-CM | POA: Diagnosis not present

## 2019-04-23 DIAGNOSIS — R600 Localized edema: Secondary | ICD-10-CM | POA: Diagnosis not present

## 2019-04-23 DIAGNOSIS — I872 Venous insufficiency (chronic) (peripheral): Secondary | ICD-10-CM

## 2019-04-23 DIAGNOSIS — I272 Pulmonary hypertension, unspecified: Secondary | ICD-10-CM

## 2019-04-23 DIAGNOSIS — R278 Other lack of coordination: Secondary | ICD-10-CM | POA: Diagnosis not present

## 2019-04-23 MED ORDER — PROPRANOLOL HCL ER 60 MG PO CP24: 60.0000 mg | ORAL_CAPSULE | Freq: Every day | ORAL | 11 refills | Status: DC

## 2019-04-23 NOTE — Progress Notes (Signed)
Patient ID: April Stevenson, female   DOB: 06/29/1924, 85 y.o.   MRN: [id number]  Provider:  Rexene Edison. Mariea Clonts, D.O., C.M.D. Location:  [facility] Room Number: 275 memory care Place of Service:  SNF (31)  PCP: Gayland Curry, DO Patient Care Team: Gayland Curry, DO as PCP - General (Geriatric Medicine) Jerline Pain, MD as PCP - Cardiology (Cardiology)  Extended Emergency Contact Information Primary Emergency Contact: Su Monks Address: [address]          [place], [place] [address] Johnnette Litter of [place] Work Phone: [phone] Mobile Phone: [phone] Relation: Son Secondary Emergency Contact: Flonnie Overman Address: [address]          [place], [place] [address] Johnnette Litter of [employer] Phone: [phone] Relation: Relative  Code Status: DNR, MOST  Goals of Care: Advanced Directive information Advanced Directives 03/15/2019  Does Patient Have a Medical Advance Directive? Yes  Type of Paramedic of [place];Living will;Out of facility DNR (pink MOST or yellow form)  Does patient want to make changes to medical advance directive? -  Copy of [place] in Chart? Yes - validated most recent copy scanned in chart (See row information)  Pre-existing out of facility DNR order (yellow form or pink MOST form) Yellow form placed in chart (order not valid for inpatient use);Pink MOST form placed in chart (order not valid for inpatient use)      Chief Complaint  Patient presents with   New Admit To SNF    New Admission to Memory Care    HPI: Patient is a 85 y.o. female seen today for admission to [facility].  She had been staying in [place], but was wandering off and requiring more assistance with her daily routine.    [place] out of chair two nights ago.  No injury.  Pt played game with a few other residents, but they struggled with it.  She also played  scrabble which goes better in memory care and they all enjoy it.    Weight down to 154 lbs.  She is not short of breath, no chest pain, no significant edema.   Meds are not optimized for her CHF.  Continues with her essential tremor.  Gait is unsteady especially upon standing.    She is eating and drinking well.  She sleeps well at night.  Bowels are moving.  Her only complaint is not being able to play bridge like she did when she was independent (prior to moving to [place]).  Past Medical History:  Diagnosis Date   Benign essential tremor 07/18/2018   Bilateral nonexudative age-related macular degeneration 07/18/2018   Cholestatic hepatitis    2001   DDD (degenerative disc disease), lumbar 04/23/2018   Depression    Dry eyes, bilateral 07/18/2018   Fatigue    GERD (gastroesophageal reflux disease)    Hiatal hernia with GERD 07/29/2011   Hoarding behavior 04/23/2018   HTN (hypertension) 07/29/2011   Hyperlipidemia    Hypertension    Hypothyroidism 07/29/2011   Insomnia    Late onset Alzheimer's disease with behavioral disturbance ([place]) 04/23/2018   LBBB (left bundle branch block) 07/29/2011   Major depressive disorder 07/29/2016   Memory impairment 12/09/2016   Memory loss    Osteoporosis    Paranoid delusion ([place]) 07/18/2018   Psychophysiological insomnia 04/23/2018   Pulmonary HTN ([place]) 03/15/2019   2 d echo 02/28/19 EF 55-60 %, mild to moderate  mitral valve regurg, mild tricuspid regurg, severely elevated estimated right ventricular systolic pressure 85 mm Hg, and mild to moderate aortic valve regurg. No diastolic CHF noted.    Pure hypercholesterolemia 07/29/2011   Sciatica    Senile osteoporosis 04/23/2018   Past Surgical History:  Procedure Laterality Date   APPENDECTOMY     CARDIAC CATHETERIZATION     FACIAL COSMETIC SURGERY     TONSILLECTOMY     TOTAL ABDOMINAL HYSTERECTOMY W/ BILATERAL SALPINGOOPHORECTOMY      reports that she has quit smoking.  Her smoking use included cigarettes. She has a 10.00 pack-year smoking history. She has never used smokeless tobacco. She reports previous alcohol use of about 1.0 standard drinks of alcohol per week. She reports that she does not use drugs. Social History   Socioeconomic History   Marital status: Widowed    Spouse name: Mikki Santee   Number of children: 1   Years of education: some college, took courses as adult also   Highest education level: Some college, no degree  Occupational History   Occupation: model    Comment: Location manager   Occupation: singer  Scientist, product/process development strain: Not hard at all   Food insecurity    Worry: Never true    Inability: Never true   Transportation needs    Medical: No    Non-medical: No  Tobacco Use   Smoking status: Former Smoker    Packs/day: 0.50    Years: 20.00    Pack years: 10.00    Types: Cigarettes   Smokeless tobacco: Never Used  Substance and Sexual Activity   Alcohol use: Not Currently    Alcohol/week: 1.0 standard drinks    Types: 1 Shots of liquor per week    Comment: nightly most nights   Drug use: Never   Sexual activity: Not Currently    Partners: Male  Lifestyle   Physical activity    Days per week: 0 days    Minutes per session: 0 min   Stress: Only a little  Relationships   Social connections    Talks on phone: Three times a week    Gets together: Once a week    Attends religious service: Never    Active member of club or organization: No    Attends meetings of clubs or organizations: Never    Relationship status: Widowed   Intimate partner violence    Fear of current or ex partner: No    Emotionally abused: No    Physically abused: No    Forced sexual activity: No  Other Topics Concern   Not on file  Social History Narrative   Was married three times and outlived all three husbands.    Functional Status Survey:    Family History  Problem Relation Age of Onset   Heart  attack Father     Health Maintenance  Topic Date Due   TETANUS/TDAP  08/31/2016   INFLUENZA VACCINE  05/18/2019   DEXA SCAN  Completed   PNA vac Low Risk Adult  Completed    Allergies  Allergen Reactions   Garlic    Lac Bovis    Lanolin    Penicillins     ? rash   Statins     myalgia    Outpatient Encounter Medications as of 04/23/2019  Medication Sig   atenolol (TENORMIN) 25 MG tablet Take 25 mg by mouth daily.   Calcium Carbonate-Vitamin D 600-400 MG-UNIT chew tablet Sarina Ser  1 tablet by mouth 3 (three) times daily with meals.   carboxymethylcellulose (REFRESH PLUS) 0.5 % SOLN 1 drop 3 (three) times daily.    Cholecalciferol (D3 HIGH POTENCY) 2000 units CAPS Take 1 capsule (2,000 Units total) by mouth daily.   donepezil (ARICEPT) 10 MG tablet Take 1 tablet (10 mg total) by mouth at bedtime.   estradiol (ESTRACE) 0.1 MG/GM vaginal cream Place 1 Applicatorful vaginally 3 (three) times a week.   FLUoxetine (PROZAC) 40 MG capsule Take 40 mg by mouth daily.   furosemide (LASIX) 40 MG tablet Take 40 mg by mouth daily.   irbesartan (AVAPRO) 300 MG tablet Take 300 mg by mouth daily.   levothyroxine (SYNTHROID, LEVOTHROID) 25 MCG tablet Take 25 mcg by mouth at bedtime.   Melatonin 10 MG TABS Take 1 tablet by mouth at bedtime.   Multiple Vitamins-Minerals (PRESERVISION AREDS 2) CAPS Take 1 capsule by mouth 2 (two) times daily.   omeprazole (PRILOSEC) 20 MG capsule Take 20 mg by mouth daily.    potassium chloride SA (K-DUR) 20 MEQ tablet Take 20 mEq by mouth daily.   traZODone (DESYREL) 50 MG tablet Take 50 mg by mouth at bedtime.    triamcinolone cream (KENALOG) 0.1 % Apply 1 application topically as needed.   vitamin C (ASCORBIC ACID) 500 MG tablet Take 1 tablet (500 mg total) by mouth daily.   [DISCONTINUED] atenolol (TENORMIN) 50 MG tablet Take 25 mg by mouth daily.    [DISCONTINUED] Estradiol 10 MCG TABS vaginal tablet Insert Vaginally five times a week    No facility-administered encounter medications on file as of 04/23/2019.     Review of Systems  Constitutional: Negative for activity change, appetite change, chills, fatigue, fever and unexpected weight change.  HENT: Negative for congestion and trouble swallowing.   Eyes: Negative for visual disturbance.  Respiratory: Negative for chest tightness and shortness of breath.   Cardiovascular: Negative for chest pain and leg swelling.       Compression hose in place  Gastrointestinal: Negative for abdominal pain, blood in stool, constipation, nausea and vomiting.  Genitourinary: Negative for dysuria.  Musculoskeletal: Positive for gait problem. Negative for arthralgias.  Skin: Negative for pallor.  Neurological: Positive for tremors. Negative for dizziness, weakness and light-headedness.  Psychiatric/Behavioral: Positive for confusion. Negative for agitation, behavioral problems, hallucinations and sleep disturbance. The patient is not nervous/anxious.     Vitals:   04/23/19 1024  BP: (!) 153/77  Pulse: (!) 57  Resp: 20  Temp: 97.8 F (36.6 C)  TempSrc: Oral  SpO2: 92%  Weight: 154 lb (69.9 kg)  Height: 5\' 9"  (1.753 m)   Body mass index is 22.74 kg/m. Physical Exam Vitals signs and nursing note reviewed.  Constitutional:      General: She is not in acute distress.    Appearance: Normal appearance. She is normal weight. She is not toxic-appearing.     Comments: Pt continues to apply her own makeup with her tremor   HENT:     Head: Normocephalic and atraumatic.  Cardiovascular:     Rate and Rhythm: Normal rate.     Pulses: Normal pulses.     Heart sounds: Normal heart sounds.  Pulmonary:     Effort: Pulmonary effort is normal.     Breath sounds: Normal breath sounds.  Abdominal:     General: Bowel sounds are normal.  Musculoskeletal: Normal range of motion.     Comments: Wearing compression hose  Skin:    General: Skin  is warm and dry.  Neurological:     General: No  focal deficit present.     Mental Status: She is alert.     Comments: Resting tremor; unsteady gait upon standing--ataxic gait  Psychiatric:        Mood and Affect: Mood normal.     Labs reviewed: Basic Metabolic Panel: Recent Labs    02/26/19 0152 03/05/19 0600  NA 142 139  K 3.4 3.6  BUN 28* 26*  CREATININE 1.0 0.9   Liver Function Tests: Recent Labs    02/26/19 0152  AST 28  ALT 49*  ALKPHOS 119   No results for input(s): LIPASE, AMYLASE in the last 8760 hours. No results for input(s): AMMONIA in the last 8760 hours. CBC: Recent Labs    02/26/19 0152  WBC 9.2  HGB 11.9*  HCT 35*  PLT 176   Cardiac Enzymes: No results for input(s): CKTOTAL, CKMB, CKMBINDEX, TROPONINI in the last 8760 hours. BNP: Invalid input(s): POCBNP No results found for: HGBA1C Lab Results  Component Value Date   TSH 2.84 10/25/2018   Lab Results  Component Value Date   [id number] 623 04/19/2018   No results found for: FOLATE No results found for: IRON, TIBC, FERRITIN  Imaging and Procedures obtained prior to SNF admission: Dg Bone Density (dxa)  Result Date: 12/17/2018 EXAM: DUAL X-RAY ABSORPTIOMETRY (DXA) FOR BONE MINERAL DENSITY IMPRESSION: Referring Physician:  Rexene Edison Haeleigh Streiff Your patient completed a BMD test using Lunar IDXA DXA system ( analysis version: 16 ) manufactured by [employer]. Technologist: AW PATIENT: Name: Nohelia, Valenza Patient ID: [id number] Birth Date: 03-Apr-1924 Height: 64.0 in. Sex: Female Measured: 12/17/2018 Weight: 159.8 lbs. Indications: Advanced Age, Caucasian, Desyrel, Estrogen Deficient, History of Osteoporosis, Hypothyroid, Hysterectomy, Levothyroxine, Postmenopausal, Prevacid, Prozac Fractures: None Treatments: Calcium (E943.0), Vitamin D (E933.5) ASSESSMENT: The BMD measured at Forearm Radius 33% is 0.784 g/cm2 with a T-score of -1.2. This patient is considered osteopenic according to [facility]) criteria. The scan quality  is good. Lumbar spine was not utilized due to advanced degenerative changes. Site Region Measured Date Measured Age YA BMD Significant CHANGE T-score Left Forearm Radius 33% 12/17/2018 92.7 -1.2 0.784 g/cm2 DualFemur Neck Left 12/17/2018 92.7 -0.5 0.964 g/cm2 DualFemur Total Mean 12/17/2018 92.7 1.1 1.152 g/cm2 World Health [facility]) criteria for post-menopausal, Caucasian Women: Normal       T-score at or above -1 SD Osteopenia   T-score between -1 and -2.5 SD Osteoporosis T-score at or below -2.5 SD RECOMMENDATION: 1. All patients should optimize calcium and vitamin D intake. 2. Consider FDA approved medical therapies in postmenopausal women and men aged 31 years and older, based on the following: a. A hip or vertebral (clinical or morphometric) fracture b. T- score < or = -2.5 at the femoral neck or spine after appropriate evaluation to exclude secondary causes c. Low bone mass (T-score between -1.0 and -2.5 at the femoral neck or spine) and a 10 year probability of a hip fracture > or = 3% or a 10 year probability of a major osteoporosis-related fracture > or = 20% based on the [place]-adapted WHO algorithm d. Clinician judgment and/or patient preferences may indicate treatment for people with 10-year fracture probabilities above or below these levels FOLLOW-UP: People with diagnosed cases of osteoporosis or at high risk for fracture should have regular bone mineral density tests. For patients eligible for Medicare, routine testing is allowed once every 2 years. The testing frequency can be increased to one year  for patients who have rapidly progressing disease, those who are receiving or discontinuing medical therapy to restore bone mass, or have additional risk factors. I have reviewed this report and agree with the above findings. [facility] Electronically Signed   By: Claudie Revering M.D.   On: 12/17/2018 16:09    Assessment/Plan 1. Late onset Alzheimer's disease with behavioral disturbance  ([place]) -continues on aricept therapy -doing better having moved to memory care  2. Pulmonary hypertension ([place]) -doing better now with chf under control--has some valvular disease, as well -weights were reduced now to three times weekly and have been stable  3. Chronic venous insufficiency -continue compression hose on in days and off at hs  4. Essential hypertension -bps ranging 140s-160s, mostly 140s -she is high risk for falls from orthostasis so will not be aggressive about bp control for this reason  5. Paranoid delusion ([place]) -she had been on seroquel at one time when she was thinking people were stealing her things, but no recent reported delusions  6. Benign essential tremor -will attempt to change atenolol 25mg  daily to propranolol long-acting 60mg  daily for a two for one benefit for bp and tremor, I hope  Family/ staff Communication: discussed with memory care nurse and activities   Labs/tests ordered:  No new   Jacere Pangborn L. Chantrice Hagg, D.O. [facility] [address], [place] [address] Cell Phone (Mon-Fri 8am-5pm):  [phone] On Call:  [phone] & follow prompts after 5pm & weekends Office Phone:  [phone] Office Fax:  [phone]

## 2019-04-29 DIAGNOSIS — G250 Essential tremor: Secondary | ICD-10-CM | POA: Diagnosis not present

## 2019-04-29 DIAGNOSIS — R600 Localized edema: Secondary | ICD-10-CM | POA: Diagnosis not present

## 2019-04-29 DIAGNOSIS — M6389 Disorders of muscle in diseases classified elsewhere, multiple sites: Secondary | ICD-10-CM | POA: Diagnosis not present

## 2019-04-29 DIAGNOSIS — R278 Other lack of coordination: Secondary | ICD-10-CM | POA: Diagnosis not present

## 2019-05-01 ENCOUNTER — Encounter: Payer: Medicare Other | Admitting: Internal Medicine

## 2019-05-10 DIAGNOSIS — R600 Localized edema: Secondary | ICD-10-CM | POA: Diagnosis not present

## 2019-05-10 DIAGNOSIS — M6389 Disorders of muscle in diseases classified elsewhere, multiple sites: Secondary | ICD-10-CM | POA: Diagnosis not present

## 2019-05-10 DIAGNOSIS — G250 Essential tremor: Secondary | ICD-10-CM | POA: Diagnosis not present

## 2019-05-10 DIAGNOSIS — R278 Other lack of coordination: Secondary | ICD-10-CM | POA: Diagnosis not present

## 2019-05-15 DIAGNOSIS — G250 Essential tremor: Secondary | ICD-10-CM | POA: Diagnosis not present

## 2019-05-15 DIAGNOSIS — R278 Other lack of coordination: Secondary | ICD-10-CM | POA: Diagnosis not present

## 2019-05-15 DIAGNOSIS — R600 Localized edema: Secondary | ICD-10-CM | POA: Diagnosis not present

## 2019-05-15 DIAGNOSIS — M6389 Disorders of muscle in diseases classified elsewhere, multiple sites: Secondary | ICD-10-CM | POA: Diagnosis not present

## 2019-05-17 DIAGNOSIS — R600 Localized edema: Secondary | ICD-10-CM | POA: Diagnosis not present

## 2019-05-17 DIAGNOSIS — R278 Other lack of coordination: Secondary | ICD-10-CM | POA: Diagnosis not present

## 2019-05-17 DIAGNOSIS — M6389 Disorders of muscle in diseases classified elsewhere, multiple sites: Secondary | ICD-10-CM | POA: Diagnosis not present

## 2019-05-17 DIAGNOSIS — G250 Essential tremor: Secondary | ICD-10-CM | POA: Diagnosis not present

## 2019-07-12 ENCOUNTER — Encounter: Payer: Self-pay | Admitting: Adult Health

## 2019-07-12 ENCOUNTER — Non-Acute Institutional Stay: Payer: Medicare Other | Admitting: Adult Health

## 2019-07-12 DIAGNOSIS — I272 Pulmonary hypertension, unspecified: Secondary | ICD-10-CM | POA: Diagnosis not present

## 2019-07-12 DIAGNOSIS — F0281 Dementia in other diseases classified elsewhere with behavioral disturbance: Secondary | ICD-10-CM

## 2019-07-12 DIAGNOSIS — F02818 Dementia in other diseases classified elsewhere, unspecified severity, with other behavioral disturbance: Secondary | ICD-10-CM

## 2019-07-12 DIAGNOSIS — I10 Essential (primary) hypertension: Secondary | ICD-10-CM

## 2019-07-12 DIAGNOSIS — G250 Essential tremor: Secondary | ICD-10-CM

## 2019-07-12 DIAGNOSIS — F5104 Psychophysiologic insomnia: Secondary | ICD-10-CM

## 2019-07-12 DIAGNOSIS — I872 Venous insufficiency (chronic) (peripheral): Secondary | ICD-10-CM | POA: Diagnosis not present

## 2019-07-12 DIAGNOSIS — G301 Alzheimer's disease with late onset: Secondary | ICD-10-CM

## 2019-07-12 NOTE — Progress Notes (Addendum)
Location:  Occupational psychologist of Service:  ALF (13) Provider:   Cindi Carbon, ANP [place] [phone]   Gayland Curry, DO  Patient Care Team: Gayland Curry, DO as PCP - General (Geriatric Medicine) Jerline Pain, MD as PCP - Cardiology (Cardiology)  Extended Emergency Contact Information Primary Emergency Contact: Su Monks Address: [address]          [place], [place] [address] Johnnette Litter of [place] Work Phone: [phone] Mobile Phone: [phone] Relation: Son Secondary Emergency Contact: Flonnie Overman Address: [address]          [place], [place] [address] Johnnette Litter of [place] Mobile Phone: [phone] Relation: Relative  Code Status:  DNR Goals of care: Advanced Directive information Advanced Directives 03/15/2019  Does Patient Have a Medical Advance Directive? Yes  Type of Paramedic of [place];Living will;Out of facility DNR (pink MOST or yellow form)  Does patient want to make changes to medical advance directive? -  Copy of [place] in Chart? Yes - validated most recent copy scanned in chart (See row information)  Pre-existing out of facility DNR order (yellow form or pink MOST form) Yellow form placed in chart (order not valid for inpatient use);Pink MOST form placed in chart (order not valid for inpatient use)     Chief Complaint  Patient presents with   Medical Management of Chronic Issues    HPI:  Pt is a 85 y.o. female seen today for medical management of chronic diseases.   The nurse reports that she has adjusted well to living in the memory care unit. She participates in activities and plays scrabble with other residents.  The resident has no acute complaints. Pulmonary htn: severely elevated RVSP 85 mmHg on echo with aortic regurg 02/28/19.  Afterwards she was started on lasix and has done well. Weights are stable with no reports of sob, doe, or cp. She  was started on propranolol for her bp and tremors in July. The tremor is unchanged but her bp is fairly well controlled 120-140 range.      Past Medical History:  Diagnosis Date   Benign essential tremor 07/18/2018   Bilateral nonexudative age-related macular degeneration 07/18/2018   Cholestatic hepatitis    2001   DDD (degenerative disc disease), lumbar 04/23/2018   Depression    Dry eyes, bilateral 07/18/2018   Fatigue    GERD (gastroesophageal reflux disease)    Hiatal hernia with GERD 07/29/2011   Hoarding behavior 04/23/2018   HTN (hypertension) 07/29/2011   Hyperlipidemia    Hypertension    Hypothyroidism 07/29/2011   Insomnia    Late onset Alzheimer's disease with behavioral disturbance ([place]) 04/23/2018   LBBB (left bundle branch block) 07/29/2011   Major depressive disorder 07/29/2016   Memory impairment 12/09/2016   Memory loss    Osteoporosis    Paranoid delusion ([place]) 07/18/2018   Psychophysiological insomnia 04/23/2018   Pulmonary HTN ([place]) 03/15/2019   2 d echo 02/28/19 EF 55-60 %, mild to moderate mitral valve regurg, mild tricuspid regurg, severely elevated estimated right ventricular systolic pressure 85 mm Hg, and mild to moderate aortic valve regurg. No diastolic CHF noted.    Pure hypercholesterolemia 07/29/2011   Sciatica    Senile osteoporosis 04/23/2018   Past Surgical History:  Procedure Laterality Date   APPENDECTOMY     CARDIAC CATHETERIZATION     FACIAL COSMETIC SURGERY     TONSILLECTOMY  TOTAL ABDOMINAL HYSTERECTOMY W/ BILATERAL SALPINGOOPHORECTOMY      Allergies  Allergen Reactions   Garlic    Lac Bovis    Lanolin    Penicillins     ? rash   Statins     myalgia    Outpatient Encounter Medications as of 07/12/2019  Medication Sig   donepezil (ARICEPT) 10 MG tablet Take 1 tablet (10 mg total) by mouth at bedtime.   FLUoxetine (PROZAC) 40 MG capsule Take 40 mg by mouth daily.   furosemide (LASIX) 40 MG  tablet Take 40 mg by mouth daily.   irbesartan (AVAPRO) 300 MG tablet Take 300 mg by mouth daily.   levothyroxine (SYNTHROID, LEVOTHROID) 25 MCG tablet Take 25 mcg by mouth at bedtime.   Melatonin 10 MG TABS Take 1 tablet by mouth at bedtime.   Multiple Vitamins-Minerals (PRESERVISION AREDS 2) CAPS Take 1 capsule by mouth 2 (two) times daily.   omeprazole (PRILOSEC) 20 MG capsule Take 20 mg by mouth daily.    potassium chloride SA (K-DUR) 20 MEQ tablet Take 20 mEq by mouth daily.   propranolol ER (INDERAL LA) 60 MG 24 hr capsule Take 1 capsule (60 mg total) by mouth daily.   traZODone (DESYREL) 50 MG tablet Take 50 mg by mouth at bedtime.    triamcinolone cream (KENALOG) 0.1 % Apply 1 application topically as needed.   vitamin C (ASCORBIC ACID) 500 MG tablet Take 1 tablet (500 mg total) by mouth daily.   Calcium Carbonate-Vitamin D 600-400 MG-UNIT chew tablet Chew 1 tablet by mouth 3 (three) times daily with meals.   carboxymethylcellulose (REFRESH PLUS) 0.5 % SOLN 1 drop 3 (three) times daily.    Cholecalciferol (D3 HIGH POTENCY) 2000 units CAPS Take 1 capsule (2,000 Units total) by mouth daily.   [DISCONTINUED] estradiol (ESTRACE) 0.1 MG/GM vaginal cream Place 1 Applicatorful vaginally 3 (three) times a week.   No facility-administered encounter medications on file as of 07/12/2019.     Review of Systems  Constitutional: Negative for activity change, appetite change, chills, diaphoresis, fatigue, fever and unexpected weight change.  HENT: Negative for congestion.   Respiratory: Negative for cough, shortness of breath and wheezing.   Cardiovascular: Positive for leg swelling. Negative for chest pain and palpitations.  Gastrointestinal: Negative for abdominal distention, abdominal pain, constipation and diarrhea.  Genitourinary: Negative for difficulty urinating and dysuria.  Musculoskeletal: Negative for arthralgias, back pain, gait problem, joint swelling and myalgias.    Neurological: Positive for tremors. Negative for dizziness, seizures, syncope, facial asymmetry, speech difficulty, weakness, light-headedness, numbness and headaches.  Psychiatric/Behavioral: Positive for confusion. Negative for agitation, behavioral problems and sleep disturbance. The patient is not nervous/anxious.     Immunization History  Administered Date(s) Administered   Influenza Split 08/14/2009, 06/29/2010, 07/19/2011, 07/29/2016   Influenza, High Dose Seasonal PF 08/08/2012, 07/26/2013, 09/17/2014, 07/31/2015   Influenza, Quadrivalent, Recombinant, Inj, Pf 07/19/2017   Influenza,inj,Quad PF,6+ Mos 08/16/2018   Influenza,trivalent, recombinat, inj, PF 07/26/2016   Pneumococcal Conjugate-13 08/21/2009   Pneumococcal Polysaccharide-23 04/20/2018   Td 10/13/1997   Tdap 08/31/2006   Zoster 06/15/2011   Zoster Recombinat (Shingrix) 05/25/2018, 08/19/2018   Pertinent  Health Maintenance Due  Topic Date Due   INFLUENZA VACCINE  05/18/2019   DEXA SCAN  Completed   PNA vac Low Risk Adult  Completed   Fall Risk  03/20/2019 10/24/2018 07/18/2018 05/02/2018  Falls in the past year? 0 0 Yes No  Number falls in past yr: 0 0 1 -  Injury with Fall? 0 0 No -   Functional Status Survey:    Vitals:   07/12/19 1014  Weight: 153 lb (69.4 kg)   Body mass index is 22.59 kg/m. Physical Exam Constitutional:      General: She is not in acute distress.    Appearance: Normal appearance.  HENT:     Nose: Nose normal. No congestion.     Mouth/Throat:     Mouth: Mucous membranes are moist.     Pharynx: Oropharynx is clear. No oropharyngeal exudate.  Neck:     Musculoskeletal: No neck rigidity or muscular tenderness.  Cardiovascular:     Rate and Rhythm: Normal rate and regular rhythm.     Heart sounds: Murmur present.  Pulmonary:     Effort: Pulmonary effort is normal.     Breath sounds: Rales present.  Abdominal:     General: Bowel sounds are normal. There is no  distension.     Palpations: Abdomen is soft.     Tenderness: There is no abdominal tenderness.  Musculoskeletal:     Right lower leg: Edema present.     Left lower leg: Edema present.  Lymphadenopathy:     Cervical: No cervical adenopathy.  Skin:    Findings: Rash (scaly dry red skin to BLE) present.  Neurological:     General: No focal deficit present.     Mental Status: She is alert. Mental status is at baseline.  Psychiatric:        Mood and Affect: Mood normal.     Labs reviewed: Recent Labs    02/26/19 0152 03/05/19 0600  NA 142 139  K 3.4 3.6  BUN 28* 26*  CREATININE 1.0 0.9   Recent Labs    02/26/19 0152  AST 28  ALT 49*  ALKPHOS 119   Recent Labs    02/26/19 0152  WBC 9.2  HGB 11.9*  HCT 35*  PLT 176   Lab Results  Component Value Date   TSH 2.84 10/25/2018   No results found for: HGBA1C Lab Results  Component Value Date   CHOL 244 (A) 04/19/2018   HDL 54 04/19/2018   LDLCALC 168 04/19/2018   TRIG 110 04/19/2018    Significant Diagnostic Results in last 30 days:  No results found.  Assessment/Plan  1. Late onset Alzheimer's disease with behavioral disturbance ([place]) Adjusted well to the memory care unit.  Progressive decline in cognition and physical function c/w the disease. Continue supportive care in the skilled environment. Continue aricept 10 mg qd  2. Pulmonary HTN ([place]) Controlled, continue lasix 40 mg qd and arb therapy  3. Essential hypertension Controlled, as above  4. Benign essential tremor No improvement in her tremor but it does not seem to bother her or interfere in her care.  Continue Inderal 60 mg qd   5. Psychophysiological insomnia Sleeps well, continue trazodone 50 mg qhs and melatonin 10 mg qhs  6. Venous stasis dermatitis of both lower extremities Triamcinolone bid as needed for rash Keep legs elevated Compression hose.      Family/ staff Communication: resident and nurse  Labs/tests ordered:  NA

## 2019-07-16 DIAGNOSIS — Z9189 Other specified personal risk factors, not elsewhere classified: Secondary | ICD-10-CM | POA: Diagnosis not present

## 2019-07-16 DIAGNOSIS — Z20828 Contact with and (suspected) exposure to other viral communicable diseases: Secondary | ICD-10-CM | POA: Diagnosis not present

## 2019-07-16 LAB — NOVEL CORONAVIRUS, NAA: SARS-CoV-2, NAA: NOT DETECTED

## 2019-07-25 DIAGNOSIS — Z20828 Contact with and (suspected) exposure to other viral communicable diseases: Secondary | ICD-10-CM | POA: Diagnosis not present

## 2019-07-31 ENCOUNTER — Encounter: Payer: Self-pay | Admitting: *Deleted

## 2019-08-01 DIAGNOSIS — Z9189 Other specified personal risk factors, not elsewhere classified: Secondary | ICD-10-CM | POA: Diagnosis not present

## 2019-08-01 DIAGNOSIS — Z20828 Contact with and (suspected) exposure to other viral communicable diseases: Secondary | ICD-10-CM | POA: Diagnosis not present

## 2019-08-05 DIAGNOSIS — Z9189 Other specified personal risk factors, not elsewhere classified: Secondary | ICD-10-CM | POA: Diagnosis not present

## 2019-08-05 DIAGNOSIS — Z20828 Contact with and (suspected) exposure to other viral communicable diseases: Secondary | ICD-10-CM | POA: Diagnosis not present

## 2019-08-12 DIAGNOSIS — Z9189 Other specified personal risk factors, not elsewhere classified: Secondary | ICD-10-CM | POA: Diagnosis not present

## 2019-08-28 DIAGNOSIS — Z9189 Other specified personal risk factors, not elsewhere classified: Secondary | ICD-10-CM | POA: Diagnosis not present

## 2019-08-28 DIAGNOSIS — Z20828 Contact with and (suspected) exposure to other viral communicable diseases: Secondary | ICD-10-CM | POA: Diagnosis not present

## 2019-09-03 DIAGNOSIS — Z20828 Contact with and (suspected) exposure to other viral communicable diseases: Secondary | ICD-10-CM | POA: Diagnosis not present

## 2019-09-03 DIAGNOSIS — Z9189 Other specified personal risk factors, not elsewhere classified: Secondary | ICD-10-CM | POA: Diagnosis not present

## 2019-09-10 ENCOUNTER — Non-Acute Institutional Stay: Payer: Medicare Other | Admitting: Internal Medicine

## 2019-09-10 ENCOUNTER — Encounter: Payer: Self-pay | Admitting: Internal Medicine

## 2019-09-10 DIAGNOSIS — E039 Hypothyroidism, unspecified: Secondary | ICD-10-CM | POA: Diagnosis not present

## 2019-09-10 DIAGNOSIS — I10 Essential (primary) hypertension: Secondary | ICD-10-CM | POA: Diagnosis not present

## 2019-09-10 DIAGNOSIS — F0281 Dementia in other diseases classified elsewhere with behavioral disturbance: Secondary | ICD-10-CM | POA: Diagnosis not present

## 2019-09-10 DIAGNOSIS — G301 Alzheimer's disease with late onset: Secondary | ICD-10-CM

## 2019-09-10 DIAGNOSIS — I272 Pulmonary hypertension, unspecified: Secondary | ICD-10-CM

## 2019-09-10 DIAGNOSIS — F02818 Dementia in other diseases classified elsewhere, unspecified severity, with other behavioral disturbance: Secondary | ICD-10-CM

## 2019-09-10 DIAGNOSIS — I872 Venous insufficiency (chronic) (peripheral): Secondary | ICD-10-CM

## 2019-09-10 DIAGNOSIS — G250 Essential tremor: Secondary | ICD-10-CM | POA: Diagnosis not present

## 2019-09-10 NOTE — Progress Notes (Signed)
Patient ID: April Stevenson, female   DOB: 09-Sep-1924, 85 y.o.   MRN: [id number]  Location:  [facility] Room Number: memory care 306 Place of Service:  ALF [phone]) Provider:   Gayland Curry, DO  Patient Care Team: Gayland Curry, DO as PCP - General (Geriatric Medicine) Jerline Pain, MD as PCP - Cardiology (Cardiology)  Extended Emergency Contact Information Primary Emergency Contact: Su Monks Address: [address]          [place], [place] [address] Johnnette Litter of [place] Work Phone: [phone] Mobile Phone: [phone] Relation: Son Secondary Emergency Contact: Flonnie Overman Address: [address]          [place], [place] [address] Johnnette Litter of [place] Mobile Phone: [phone] Relation: Relative  Code Status:  DNR, MOST Goals of care: Advanced Directive information Advanced Directives 03/15/2019  Does Patient Have a Medical Advance Directive? Yes  Type of Paramedic of [place];Living will;Out of facility DNR (pink MOST or yellow form)  Does patient want to make changes to medical advance directive? -  Copy of [place] in Chart? Yes - validated most recent copy scanned in chart (See row information)  Pre-existing out of facility DNR order (yellow form or pink MOST form) Yellow form placed in chart (order not valid for inpatient use);Pink MOST form placed in chart (order not valid for inpatient use)     Chief Complaint  Patient presents with  . Medical Management of Chronic Issues    Routine Visit    HPI:  Pt is a 85 y.o. female seen today for medical management of chronic diseases.    She lives in memory care for advanced dementia.  She also has tremors and unsteady gait.  She is on propranolol for the essential tremor.  She's historically had some hallucinations.  She has also been depressed and came to [place] on prozac.  She remains on aricept for her memory.  She uses  melatonin for sleep.    She had no complaints herself.  Staff are monitoring her bowels more closely, but she does still toilet herself so it's been challenging because she does not remember her bms each time.    BP today was elevated with an automatic cuff.    She's on preservision areds 2 for her macular degeneration.  She takes ca with D and additonal D3 for her osteoporosis but is not currently on other medication.    Lab Results  Component Value Date   TSH 2.84 10/25/2018  she's been clinically euthyroid on levothyroxine 35mcg.  Staff reported no new concerns with [facility].  She continues to socialize with a few of the other ladies in memory care.  She will also play games, do puzzles with them at times.    She's had no reported sob or increased edema since being in memory care. Past Medical History:  Diagnosis Date  . Benign essential tremor 07/18/2018  . Bilateral nonexudative age-related macular degeneration 07/18/2018  . Cholestatic hepatitis    2001  . DDD (degenerative disc disease), lumbar 04/23/2018  . Depression   . Dry eyes, bilateral 07/18/2018  . Fatigue   . GERD (gastroesophageal reflux disease)   . Hiatal hernia with GERD 07/29/2011  . Hoarding behavior 04/23/2018  . HTN (hypertension) 07/29/2011  . Hyperlipidemia   . Hypertension   . Hypothyroidism 07/29/2011  . Insomnia   . Late onset Alzheimer's disease with behavioral disturbance ([place]) 04/23/2018  . LBBB (left bundle  branch block) 07/29/2011  . Major depressive disorder 07/29/2016  . Memory impairment 12/09/2016  . Memory loss   . Osteoporosis   . Paranoid delusion ([place]) 07/18/2018  . Psychophysiological insomnia 04/23/2018  . Pulmonary HTN ([place]) 03/15/2019   2 d echo 02/28/19 EF 55-60 %, mild to moderate mitral valve regurg, mild tricuspid regurg, severely elevated estimated right ventricular systolic pressure 85 mm Hg, and mild to moderate aortic valve regurg. No diastolic CHF noted.   . Pure  hypercholesterolemia 07/29/2011  . Sciatica   . Senile osteoporosis 04/23/2018   Past Surgical History:  Procedure Laterality Date  . APPENDECTOMY    . CARDIAC CATHETERIZATION    . FACIAL COSMETIC SURGERY    . TONSILLECTOMY    . TOTAL ABDOMINAL HYSTERECTOMY W/ BILATERAL SALPINGOOPHORECTOMY      Allergies  Allergen Reactions  . Garlic   . Lac Bovis   . Lanolin   . Penicillins     ? rash  . Statins     myalgia    Outpatient Encounter Medications as of 09/10/2019  Medication Sig  . Calcium Carbonate-Vitamin D 600-400 MG-UNIT chew tablet Chew 1 tablet by mouth 3 (three) times daily with meals.  . carboxymethylcellulose (REFRESH PLUS) 0.5 % SOLN 1 drop 3 (three) times daily.   . Cholecalciferol (D3 HIGH POTENCY) 2000 units CAPS Take 1 capsule (2,000 Units total) by mouth daily.  Marland Kitchen donepezil (ARICEPT) 10 MG tablet Take 1 tablet (10 mg total) by mouth at bedtime.  Marland Kitchen FLUoxetine (PROZAC) 40 MG capsule Take 40 mg by mouth daily.  . furosemide (LASIX) 40 MG tablet Take 40 mg by mouth daily.  . irbesartan (AVAPRO) 300 MG tablet Take 300 mg by mouth daily.  Marland Kitchen levothyroxine (SYNTHROID, LEVOTHROID) 25 MCG tablet Take 25 mcg by mouth at bedtime.  . Melatonin 10 MG TABS Take 1 tablet by mouth at bedtime.  . Multiple Vitamins-Minerals (PRESERVISION AREDS 2) CAPS Take 1 capsule by mouth 2 (two) times daily.  Marland Kitchen omeprazole (PRILOSEC) 20 MG capsule Take 20 mg by mouth daily.   . potassium chloride SA (K-DUR) 20 MEQ tablet Take 20 mEq by mouth daily.  . propranolol ER (INDERAL LA) 60 MG 24 hr capsule Take 1 capsule (60 mg total) by mouth daily.  . traZODone (DESYREL) 50 MG tablet Take 50 mg by mouth at bedtime.   . triamcinolone cream (KENALOG) 0.1 % Apply 1 application topically as needed.  . vitamin C (ASCORBIC ACID) 500 MG tablet Take 1 tablet (500 mg total) by mouth daily.   No facility-administered encounter medications on file as of 09/10/2019.     Review of Systems  Constitutional:  Negative for activity change, appetite change, chills and fever.  Eyes: Negative for visual disturbance.  Respiratory: Negative for chest tightness and shortness of breath.   Cardiovascular: Negative for chest pain, palpitations and leg swelling.  Gastrointestinal: Positive for constipation. Negative for abdominal pain, diarrhea, nausea and vomiting.  Genitourinary: Negative for dysuria.  Musculoskeletal: Positive for gait problem. Negative for arthralgias and back pain.  Neurological: Positive for tremors. Negative for dizziness and weakness.  Hematological: Bruises/bleeds easily.  Psychiatric/Behavioral: Positive for confusion. Negative for agitation, behavioral problems, hallucinations and sleep disturbance. The patient is not nervous/anxious.     Immunization History  Administered Date(s) Administered  . Influenza Split 08/14/2009, 06/29/2010, 07/19/2011, 07/29/2016  . Influenza, High Dose Seasonal PF 08/08/2012, 07/26/2013, 09/17/2014, 07/31/2015, 08/15/2019  . Influenza, Quadrivalent, Recombinant, Inj, Pf 07/19/2017  . Influenza,inj,Quad PF,6+ Mos 08/16/2018  .  Influenza,trivalent, recombinat, inj, PF 07/26/2016  . Pneumococcal Conjugate-13 08/21/2009  . Pneumococcal Polysaccharide-23 04/20/2018  . Td 10/13/1997  . Tdap 08/31/2006  . Zoster 06/15/2011  . Zoster Recombinat (Shingrix) 05/25/2018, 08/19/2018   Pertinent  Health Maintenance Due  Topic Date Due  . INFLUENZA VACCINE  Completed  . DEXA SCAN  Completed  . PNA vac Low Risk Adult  Completed   Fall Risk  03/20/2019 10/24/2018 07/18/2018 05/02/2018  Falls in the past year? 0 0 Yes No  Number falls in past yr: 0 0 1 -  Injury with Fall? 0 0 No -   Functional Status Survey:    Vitals:   09/10/19 1147  BP: (!) 178/64  Pulse: (!) 51  Resp: 18  Temp: (!) 97.1 F (36.2 C)  TempSrc: Oral  Weight: 155 lb (70.3 kg)  Height: 5\' 9"  (1.753 m)   Body mass index is 22.89 kg/m. Physical Exam Vitals signs reviewed.    Constitutional:      General: She is not in acute distress.    Appearance: Normal appearance. She is not toxic-appearing.  HENT:     Head: Normocephalic and atraumatic.  Cardiovascular:     Rate and Rhythm: Normal rate and regular rhythm.     Heart sounds: Murmur present.  Pulmonary:     Effort: Pulmonary effort is normal.     Breath sounds: Normal breath sounds. No rales.  Musculoskeletal: Normal range of motion.     Right lower leg: No edema.     Left lower leg: No edema.  Skin:    General: Skin is warm and dry.  Neurological:     General: No focal deficit present.     Mental Status: She is alert. Mental status is at baseline.     Comments: Fast tremor of UE  Psychiatric:        Mood and Affect: Mood normal.     Labs reviewed: Recent Labs    02/26/19 0152 03/05/19 0600  NA 142 139  K 3.4 3.6  BUN 28* 26*  CREATININE 1.0 0.9   Recent Labs    02/26/19 0152  AST 28  ALT 49*  ALKPHOS 119   Recent Labs    02/26/19 0152  WBC 9.2  HGB 11.9*  HCT 35*  PLT 176   Lab Results  Component Value Date   TSH 2.84 10/25/2018   No results found for: HGBA1C Lab Results  Component Value Date   CHOL 244 (A) 04/19/2018   HDL 54 04/19/2018   LDLCALC 168 04/19/2018   TRIG 110 04/19/2018    Assessment/Plan: 1. Late onset Alzheimer's disease with behavioral disturbance ([place]) -gradually progressive -seemed to change quite dramatically from time of move to AL to transfer to memory care, but recently more stable since that move  2. Pulmonary HTN ([place]) -recently euvolemic volume status with current regimen, no notable dyspnea or edema  -cont to monitor  3. Chronic venous insufficiency -cont compression hose and elevation of feet  4. Benign essential tremor -cont propranolol  5. Essential hypertension -bp elevated last check; prior time within this past month was reasonable at [id number] systolic given her unsteady gait and fall risk  6. Hypothyroidism, unspecified  type -cont current levothyroxine and monitor tsh annually--due to recheck in new year  Family/ staff Communication: discussed with memory care nurse and notes reviewed  Labs/tests ordered:  No new  Makaylee Spielberg L. Marjorie Deprey, D.O. [facility] [address],  [place] [address] Cell Phone (Mon-Fri 8am-5pm):  [phone] On Call:  [phone] & follow prompts after 5pm & weekends Office Phone:  [phone] Office Fax:  [phone]

## 2019-09-19 DIAGNOSIS — I872 Venous insufficiency (chronic) (peripheral): Secondary | ICD-10-CM | POA: Insufficient documentation

## 2019-10-02 DIAGNOSIS — Z9189 Other specified personal risk factors, not elsewhere classified: Secondary | ICD-10-CM | POA: Diagnosis not present

## 2019-10-02 DIAGNOSIS — Z20828 Contact with and (suspected) exposure to other viral communicable diseases: Secondary | ICD-10-CM | POA: Diagnosis not present

## 2019-10-08 DIAGNOSIS — Z9189 Other specified personal risk factors, not elsewhere classified: Secondary | ICD-10-CM | POA: Diagnosis not present

## 2019-10-08 DIAGNOSIS — Z20828 Contact with and (suspected) exposure to other viral communicable diseases: Secondary | ICD-10-CM | POA: Diagnosis not present

## 2019-10-22 DIAGNOSIS — Z23 Encounter for immunization: Secondary | ICD-10-CM | POA: Diagnosis not present

## 2019-10-28 DIAGNOSIS — Z20828 Contact with and (suspected) exposure to other viral communicable diseases: Secondary | ICD-10-CM | POA: Diagnosis not present

## 2019-10-28 DIAGNOSIS — Z9189 Other specified personal risk factors, not elsewhere classified: Secondary | ICD-10-CM | POA: Diagnosis not present

## 2019-10-31 ENCOUNTER — Non-Acute Institutional Stay: Payer: Medicare Other | Admitting: Adult Health

## 2019-10-31 ENCOUNTER — Encounter: Payer: Self-pay | Admitting: Adult Health

## 2019-10-31 DIAGNOSIS — K449 Diaphragmatic hernia without obstruction or gangrene: Secondary | ICD-10-CM | POA: Diagnosis not present

## 2019-10-31 DIAGNOSIS — I272 Pulmonary hypertension, unspecified: Secondary | ICD-10-CM | POA: Diagnosis not present

## 2019-10-31 DIAGNOSIS — E039 Hypothyroidism, unspecified: Secondary | ICD-10-CM

## 2019-10-31 DIAGNOSIS — G250 Essential tremor: Secondary | ICD-10-CM

## 2019-10-31 DIAGNOSIS — K219 Gastro-esophageal reflux disease without esophagitis: Secondary | ICD-10-CM | POA: Diagnosis not present

## 2019-10-31 DIAGNOSIS — F02818 Dementia in other diseases classified elsewhere, unspecified severity, with other behavioral disturbance: Secondary | ICD-10-CM

## 2019-10-31 DIAGNOSIS — G301 Alzheimer's disease with late onset: Secondary | ICD-10-CM | POA: Diagnosis not present

## 2019-10-31 DIAGNOSIS — F0281 Dementia in other diseases classified elsewhere with behavioral disturbance: Secondary | ICD-10-CM

## 2019-10-31 DIAGNOSIS — F3341 Major depressive disorder, recurrent, in partial remission: Secondary | ICD-10-CM

## 2019-10-31 DIAGNOSIS — M810 Age-related osteoporosis without current pathological fracture: Secondary | ICD-10-CM | POA: Diagnosis not present

## 2019-10-31 DIAGNOSIS — Z66 Do not resuscitate: Secondary | ICD-10-CM

## 2019-10-31 NOTE — Progress Notes (Signed)
Location:  [facility] of Service:  ALF (13) Provider:  Cindi Carbon, ANP [place] [phone]   Gayland Curry, DO  Patient Care Team: Gayland Curry, DO as PCP - General (Geriatric Medicine) Jerline Pain, MD as PCP - Cardiology (Cardiology)  Extended Emergency Contact Information Primary Emergency Contact: Su Monks Address: [address]          [place], [place] [address] Johnnette Litter of [place] Work Phone: [phone] Mobile Phone: [phone] Relation: Son Secondary Emergency Contact: Flonnie Overman Address: [address]          [place], [place] [address] Johnnette Litter of [place] Mobile Phone: [phone] Relation: Relative  Code Status:  DNR Goals of care: Advanced Directive information Advanced Directives 03/15/2019  Does Patient Have a Medical Advance Directive? Yes  Type of Paramedic of [place];Living will;Out of facility DNR (pink MOST or yellow form)  Does patient want to make changes to medical advance directive? -  Copy of [place] in Chart? Yes - validated most recent copy scanned in chart (See row information)  Pre-existing out of facility DNR order (yellow form or pink MOST form) Yellow form placed in chart (order not valid for inpatient use);Pink MOST form placed in chart (order not valid for inpatient use)     Chief Complaint  Patient presents with  . Medical Management of Chronic Issues    HPI:  Pt is a 86 y.o. female seen today for medical management of chronic diseases.    She resides in the memory care unit. She remains ambulatory and pleasant.Voices that she would like to participate in more activities when the pandemic is over.   MMSE 20/30 with failed clock on 10/10/19  Denies any symptoms of CHF such as sob, doe, increased edema, or weight gain.   Denies any issues with reflux of swallowing.   Continues with an essential tremor to her head  and arms. Denies any issues with difficulty or with personal care.   Past Medical History:  Diagnosis Date  . Benign essential tremor 07/18/2018  . Bilateral nonexudative age-related macular degeneration 07/18/2018  . Cholestatic hepatitis    2001  . DDD (degenerative disc disease), lumbar 04/23/2018  . Depression   . Dry eyes, bilateral 07/18/2018  . Fatigue   . GERD (gastroesophageal reflux disease)   . Hiatal hernia with GERD 07/29/2011  . Hoarding behavior 04/23/2018  . HTN (hypertension) 07/29/2011  . Hyperlipidemia   . Hypertension   . Hypothyroidism 07/29/2011  . Insomnia   . Late onset Alzheimer's disease with behavioral disturbance ([place]) 04/23/2018  . LBBB (left bundle branch block) 07/29/2011  . Major depressive disorder 07/29/2016  . Memory impairment 12/09/2016  . Memory loss   . Osteoporosis   . Paranoid delusion ([place]) 07/18/2018  . Psychophysiological insomnia 04/23/2018  . Pulmonary HTN ([place]) 03/15/2019   2 d echo 02/28/19 EF 55-60 %, mild to moderate mitral valve regurg, mild tricuspid regurg, severely elevated estimated right ventricular systolic pressure 85 mm Hg, and mild to moderate aortic valve regurg. No diastolic CHF noted.   . Pure hypercholesterolemia 07/29/2011  . Sciatica   . Senile osteoporosis 04/23/2018   Past Surgical History:  Procedure Laterality Date  . APPENDECTOMY    . CARDIAC CATHETERIZATION    . FACIAL COSMETIC SURGERY    . TONSILLECTOMY    . TOTAL ABDOMINAL HYSTERECTOMY W/ BILATERAL SALPINGOOPHORECTOMY      Allergies  Allergen Reactions  .  Garlic   . Lac Bovis   . Lanolin   . Penicillins     ? rash  . Statins     myalgia    Outpatient Encounter Medications as of 10/31/2019  Medication Sig  . Calcium Carbonate-Vitamin D 600-400 MG-UNIT chew tablet Chew 1 tablet by mouth 3 (three) times daily with meals.  . carboxymethylcellulose (REFRESH PLUS) 0.5 % SOLN 1 drop 3 (three) times daily.   . Cholecalciferol (D3 HIGH POTENCY) 2000 units CAPS  Take 1 capsule (2,000 Units total) by mouth daily.  Marland Kitchen donepezil (ARICEPT) 10 MG tablet Take 1 tablet (10 mg total) by mouth at bedtime.  Marland Kitchen FLUoxetine (PROZAC) 40 MG capsule Take 40 mg by mouth daily.  . furosemide (LASIX) 40 MG tablet Take 40 mg by mouth daily.  . irbesartan (AVAPRO) 300 MG tablet Take 300 mg by mouth daily.  Marland Kitchen levothyroxine (SYNTHROID, LEVOTHROID) 25 MCG tablet Take 25 mcg by mouth at bedtime.  . Melatonin 10 MG TABS Take 1 tablet by mouth at bedtime.  . Multiple Vitamins-Minerals (PRESERVISION AREDS 2) CAPS Take 1 capsule by mouth 2 (two) times daily.  Marland Kitchen omeprazole (PRILOSEC) 20 MG capsule Take 20 mg by mouth daily.   . potassium chloride SA (K-DUR) 20 MEQ tablet Take 20 mEq by mouth daily.  . propranolol ER (INDERAL LA) 60 MG 24 hr capsule Take 1 capsule (60 mg total) by mouth daily.  . traZODone (DESYREL) 50 MG tablet Take 50 mg by mouth at bedtime.   . triamcinolone cream (KENALOG) 0.1 % Apply 1 application topically as needed.  . vitamin C (ASCORBIC ACID) 500 MG tablet Take 1 tablet (500 mg total) by mouth daily.   No facility-administered encounter medications on file as of 10/31/2019.    Review of Systems  Constitutional: Negative for activity change, appetite change, chills, diaphoresis, fever and unexpected weight change.  HENT: Negative for congestion.   Respiratory: Negative for cough, shortness of breath and wheezing.   Cardiovascular: Positive for leg swelling. Negative for chest pain and palpitations.  Gastrointestinal: Negative for abdominal pain, constipation, diarrhea, nausea and vomiting.  Genitourinary: Negative for dysuria and urgency.  Musculoskeletal: Negative for back pain, myalgias and neck pain.  Skin: Negative for rash.  Neurological: Positive for tremors. Negative for syncope and weakness.  Psychiatric/Behavioral: Positive for confusion. Negative for agitation, behavioral problems, decreased concentration, dysphoric mood, hallucinations,  self-injury, sleep disturbance and suicidal ideas. The patient is not nervous/anxious and is not hyperactive.     Immunization History  Administered Date(s) Administered  . Influenza Split 08/14/2009, 06/29/2010, 07/19/2011, 07/29/2016  . Influenza, High Dose Seasonal PF 08/08/2012, 07/26/2013, 09/17/2014, 07/31/2015, 08/15/2019  . Influenza, Quadrivalent, Recombinant, Inj, Pf 07/19/2017  . Influenza,inj,Quad PF,6+ Mos 08/16/2018  . Influenza,trivalent, recombinat, inj, PF 07/26/2016  . Moderna SARS-COVID-2 Vaccination 10/22/2019  . Pneumococcal Conjugate-13 08/21/2009  . Pneumococcal Polysaccharide-23 04/20/2018  . Td 10/13/1997  . Tdap 08/31/2006  . Zoster 06/15/2011  . Zoster Recombinat (Shingrix) 05/25/2018, 08/19/2018   Pertinent  Health Maintenance Due  Topic Date Due  . INFLUENZA VACCINE  Completed  . DEXA SCAN  Completed  . PNA vac Low Risk Adult  Completed   Fall Risk  03/20/2019 10/24/2018 07/18/2018 05/02/2018  Falls in the past year? 0 0 Yes No  Number falls in past yr: 0 0 1 -  Injury with Fall? 0 0 No -   Functional Status Survey:    Vitals:   10/31/19 1528  Weight: 155 lb 9.6 oz (70.6  kg)   Body mass index is 22.98 kg/m. Physical Exam Vitals and nursing note reviewed.  Constitutional:      General: She is not in acute distress.    Appearance: She is not diaphoretic.  HENT:     Head: Normocephalic and atraumatic.     Nose: Nose normal. No congestion.  Neck:     Vascular: No JVD.  Cardiovascular:     Rate and Rhythm: Normal rate and regular rhythm.     Heart sounds: Murmur present.  Pulmonary:     Effort: Pulmonary effort is normal. No respiratory distress.     Breath sounds: Normal breath sounds. No wheezing.  Abdominal:     General: Bowel sounds are normal. There is no distension.     Palpations: Abdomen is soft.     Tenderness: There is no abdominal tenderness.  Musculoskeletal:        General: No swelling, tenderness, deformity or signs of injury.      Cervical back: No rigidity.     Right lower leg: Edema present.     Left lower leg: Edema present.  Lymphadenopathy:     Cervical: No cervical adenopathy.  Skin:    General: Skin is warm and dry.  Neurological:     General: No focal deficit present.     Mental Status: She is alert. Mental status is at baseline.     Comments: Tremor to hands and face  Psychiatric:        Mood and Affect: Mood normal.     Labs reviewed: Recent Labs    02/26/19 0152 03/05/19 0600  NA 142 139  K 3.4 3.6  BUN 28* 26*  CREATININE 1.0 0.9   Recent Labs    02/26/19 0152  AST 28  ALT 49*  ALKPHOS 119   Recent Labs    02/26/19 0152  WBC 9.2  HGB 11.9*  HCT 35*  PLT 176   Lab Results  Component Value Date   TSH 2.84 10/25/2018   No results found for: HGBA1C Lab Results  Component Value Date   CHOL 244 (A) 04/19/2018   HDL 54 04/19/2018   LDLCALC 168 04/19/2018   TRIG 110 04/19/2018    Significant Diagnostic Results in last 30 days:  No results found.  Assessment/Plan 1. DNR (do not resuscitate) Needs updated order - DNR (Do Not Resuscitate)  2. Late onset Alzheimer's disease with behavioral disturbance ([place]) Progressive decline in cognition and physical function c/w the disease. Continue supportive care in the skilled environment. Continue Aricept 10 mg qhs  3. Pulmonary HTN (HCC) Euvolemic, no current symptoms Continue Lasix 40 mg qd and Avapro 300 mg qd Continue compression hose  4. Hypothyroidism, unspecified type Needs tsh  5. Hiatal hernia with GERD No current symptoms Continue prilosec 20 mg qd, would not taper  6. Senile osteoporosis BMD 12/17/18 indicated a T score of -1.2  Continue Ca with Vit D supplementation   7. Benign essential tremor Controlled with propranolol, not interfering with care  8. Recurrent major depressive disorder, in partial remission (HCC) Mood is unchanged Continue prozac 40 mg qd     Family/ staff Communication:  resident and nurse   Labs/tests ordered:  TSH

## 2019-11-01 DIAGNOSIS — E039 Hypothyroidism, unspecified: Secondary | ICD-10-CM | POA: Diagnosis not present

## 2019-11-04 DIAGNOSIS — Z9189 Other specified personal risk factors, not elsewhere classified: Secondary | ICD-10-CM | POA: Diagnosis not present

## 2019-11-04 DIAGNOSIS — Z20828 Contact with and (suspected) exposure to other viral communicable diseases: Secondary | ICD-10-CM | POA: Diagnosis not present

## 2019-11-07 DIAGNOSIS — Z20828 Contact with and (suspected) exposure to other viral communicable diseases: Secondary | ICD-10-CM | POA: Diagnosis not present

## 2019-11-07 DIAGNOSIS — Z9189 Other specified personal risk factors, not elsewhere classified: Secondary | ICD-10-CM | POA: Diagnosis not present

## 2019-11-11 DIAGNOSIS — Z9189 Other specified personal risk factors, not elsewhere classified: Secondary | ICD-10-CM | POA: Diagnosis not present

## 2019-11-11 DIAGNOSIS — Z20828 Contact with and (suspected) exposure to other viral communicable diseases: Secondary | ICD-10-CM | POA: Diagnosis not present

## 2019-11-18 DIAGNOSIS — Z20828 Contact with and (suspected) exposure to other viral communicable diseases: Secondary | ICD-10-CM | POA: Diagnosis not present

## 2019-11-18 DIAGNOSIS — Z9189 Other specified personal risk factors, not elsewhere classified: Secondary | ICD-10-CM | POA: Diagnosis not present

## 2019-11-26 ENCOUNTER — Non-Acute Institutional Stay: Payer: Medicare Other | Admitting: Internal Medicine

## 2019-11-26 ENCOUNTER — Encounter: Payer: Self-pay | Admitting: Internal Medicine

## 2019-11-26 DIAGNOSIS — G250 Essential tremor: Secondary | ICD-10-CM

## 2019-11-26 DIAGNOSIS — G301 Alzheimer's disease with late onset: Secondary | ICD-10-CM | POA: Diagnosis not present

## 2019-11-26 DIAGNOSIS — F0281 Dementia in other diseases classified elsewhere with behavioral disturbance: Secondary | ICD-10-CM | POA: Diagnosis not present

## 2019-11-26 DIAGNOSIS — I272 Pulmonary hypertension, unspecified: Secondary | ICD-10-CM | POA: Diagnosis not present

## 2019-11-26 DIAGNOSIS — K449 Diaphragmatic hernia without obstruction or gangrene: Secondary | ICD-10-CM | POA: Diagnosis not present

## 2019-11-26 DIAGNOSIS — K219 Gastro-esophageal reflux disease without esophagitis: Secondary | ICD-10-CM | POA: Diagnosis not present

## 2019-11-26 DIAGNOSIS — M810 Age-related osteoporosis without current pathological fracture: Secondary | ICD-10-CM

## 2019-11-26 DIAGNOSIS — F3341 Major depressive disorder, recurrent, in partial remission: Secondary | ICD-10-CM | POA: Diagnosis not present

## 2019-11-26 DIAGNOSIS — E039 Hypothyroidism, unspecified: Secondary | ICD-10-CM

## 2019-11-26 DIAGNOSIS — F02818 Dementia in other diseases classified elsewhere, unspecified severity, with other behavioral disturbance: Secondary | ICD-10-CM

## 2019-11-26 NOTE — Progress Notes (Signed)
Location:  [facility] [place] Room Number: [place] of Service:  ALF [phone]) Provider:   Gayland Curry, DO  Patient Care Team: Gayland Curry, DO as PCP - General (Geriatric Medicine) Jerline Pain, MD as PCP - Cardiology (Cardiology)  Extended Emergency Contact Information Primary Emergency Contact: Su Monks Address: [address]          [place], [place] [address] Johnnette Litter of [place] Work Phone: [phone] Mobile Phone: [phone] Relation: Son Secondary Emergency Contact: Flonnie Overman Address: [address]          [place],  [address] Johnnette Litter of [place] Mobile Phone: [phone] Relation: Relative  Code Status:  DNR, MOST Goals of care: Advanced Directive information Advanced Directives 11/26/2019  Does Patient Have a Medical Advance Directive? Yes  Type of Advance Directive Out of facility DNR (pink MOST or yellow form)  Does patient want to make changes to medical advance directive? No - Patient declined  Copy of [place] in Chart? -  Pre-existing out of facility DNR order (yellow form or pink MOST form) -     Chief Complaint  Patient presents with  . Medical Management of Chronic Issues    Routine Visit    HPI:  Pt is a 86 y.o. female seen today for medical management of chronic diseases.  April Stevenson continues to do very well in memory care.  She plays games with other residents (seen doing this just about every time I round), does puzzles, participates in activities, and talks with other residents.  She walks around the unit some.  She will then say she has nothing to do and wants to play bridge (but could not keep up with others who do if it presented itself). Since the initial adjustment, her CHF has remained stable and well controlled w/o volume overload with lasix and potassium.  Spirits are good on her longstanding prozac though we did opt to dose reduce it b/c it is not ideal in  older adults and she has not seemed depressed at all recently.  She remains on aricept for her dementia.  She takes only vitamins (ca with D3) for osteoporosis since giving ideal meds in long-term care has coverage challenges and she's refused medications when able to make such decisions.  She does sleep well with her trazodone and melatonin.  Her tremors persist but are helped with the propranolol.    Apparently, there may be plans for her to move back home with family but administration has no idea about this whatsoever.  This would be incredibly challenging and she'd require an outside caregiver and 24hr supervision.     Past Medical History:  Diagnosis Date  . Benign essential tremor 07/18/2018  . Bilateral nonexudative age-related macular degeneration 07/18/2018  . Cholestatic hepatitis    2001  . DDD (degenerative disc disease), lumbar 04/23/2018  . Depression   . Dry eyes, bilateral 07/18/2018  . Fatigue   . GERD (gastroesophageal reflux disease)   . Hiatal hernia with GERD 07/29/2011  . Hoarding behavior 04/23/2018  . HTN (hypertension) 07/29/2011  . Hyperlipidemia   . Hypertension   . Hypothyroidism 07/29/2011  . Insomnia   . Late onset Alzheimer's disease with behavioral disturbance ([facility]) 04/23/2018  . LBBB (left bundle branch block) 07/29/2011  . Major depressive disorder 07/29/2016  . Memory impairment 12/09/2016  . Memory loss   . Osteoporosis   . Paranoid delusion ([place] [place]) 07/18/2018  . Psychophysiological insomnia 04/23/2018  .  Pulmonary HTN ([facility]) 03/15/2019   2 d echo 02/28/19 EF 55-60 %, mild to moderate mitral valve regurg, mild tricuspid regurg, severely elevated estimated right ventricular systolic pressure 85 mm Hg, and mild to moderate aortic valve regurg. No diastolic CHF noted.   . Pure hypercholesterolemia 07/29/2011  . Sciatica   . Senile osteoporosis 04/23/2018   Past Surgical History:  Procedure Laterality Date  . APPENDECTOMY    . CARDIAC CATHETERIZATION    . FACIAL  COSMETIC SURGERY    . TONSILLECTOMY    . TOTAL ABDOMINAL HYSTERECTOMY W/ BILATERAL SALPINGOOPHORECTOMY      Allergies  Allergen Reactions  . Garlic   . Lac Bovis   . Lanolin   . Penicillins     ? rash  . Statins     myalgia    Outpatient Encounter Medications as of 11/26/2019  Medication Sig  . Calcium Carbonate-Vitamin D 600-400 MG-UNIT chew tablet Chew 1 tablet by mouth 3 (three) times daily with meals.  . Cholecalciferol (D3 HIGH POTENCY) 2000 units CAPS Take 1 capsule (2,000 Units total) by mouth daily.  Marland Kitchen donepezil (ARICEPT) 10 MG tablet Take 1 tablet (10 mg total) by mouth at bedtime.  Marland Kitchen FLUoxetine (PROZAC) 40 MG capsule Take 40 mg by mouth daily.  . furosemide (LASIX) 40 MG tablet Take 40 mg by mouth daily.  . irbesartan (AVAPRO) 300 MG tablet Take 300 mg by mouth daily.  Marland Kitchen levothyroxine (SYNTHROID, LEVOTHROID) 25 MCG tablet Take 25 mcg by mouth at bedtime.  . Melatonin 10 MG TABS Take 1 tablet by mouth at bedtime.  . Multiple Vitamins-Minerals (PRESERVISION AREDS 2) CAPS Take 1 capsule by mouth 2 (two) times daily.  Marland Kitchen omeprazole (PRILOSEC) 20 MG capsule Take 20 mg by mouth daily.   . potassium chloride SA (K-DUR) 20 MEQ tablet Take 20 mEq by mouth daily.  . propranolol ER (INDERAL LA) 60 MG 24 hr capsule Take 1 capsule (60 mg total) by mouth daily.  . traZODone (DESYREL) 50 MG tablet Take 50 mg by mouth at bedtime.   . triamcinolone cream (KENALOG) 0.1 % Apply 1 application topically as needed.  . vitamin C (ASCORBIC ACID) 500 MG tablet Take 1 tablet (500 mg total) by mouth daily.  . [DISCONTINUED] carboxymethylcellulose (REFRESH PLUS) 0.5 % SOLN 1 drop 3 (three) times daily.    No facility-administered encounter medications on file as of 11/26/2019.    Review of Systems  Constitutional: Negative for activity change, appetite change, chills and fever.  HENT: Negative for congestion, sore throat and trouble swallowing.   Eyes: Negative for pain.  Respiratory: Negative for  chest tightness and shortness of breath.   Cardiovascular: Negative for chest pain, palpitations and leg swelling.  Gastrointestinal: Negative for abdominal pain, constipation, diarrhea and nausea.  Genitourinary: Negative for dysuria.       Incontinence  Musculoskeletal: Positive for gait problem.       Unsteady  Neurological: Positive for tremors. Negative for dizziness and weakness.  Psychiatric/Behavioral: Positive for confusion. Negative for agitation, behavioral problems and sleep disturbance. The patient is not nervous/anxious.     Immunization History  Administered Date(s) Administered  . Influenza Split 08/14/2009, 06/29/2010, 07/19/2011, 07/29/2016  . Influenza, High Dose Seasonal PF 08/08/2012, 07/26/2013, 09/17/2014, 07/31/2015, 08/15/2019  . Influenza, Quadrivalent, Recombinant, Inj, Pf 07/19/2017  . Influenza,inj,Quad PF,6+ Mos 08/16/2018  . Influenza,trivalent, recombinat, inj, PF 07/26/2016  . Moderna SARS-COVID-2 Vaccination 10/22/2019, 11/19/2019  . Pneumococcal Conjugate-13 08/21/2009  . Pneumococcal Polysaccharide-23 04/20/2018  . Td  10/13/1997  . Tdap 08/31/2006  . Zoster 06/15/2011  . Zoster Recombinat (Shingrix) 05/25/2018, 08/19/2018   Pertinent  Health Maintenance Due  Topic Date Due  . INFLUENZA VACCINE  Completed  . DEXA SCAN  Completed  . PNA vac Low Risk Adult  Completed   Fall Risk  03/20/2019 10/24/2018 07/18/2018 05/02/2018  Falls in the past year? 0 0 Yes No  Number falls in past yr: 0 0 1 -  Injury with Fall? 0 0 No -   Functional Status Survey:    Vitals:   11/26/19 1434  BP: (!) 142/70  Pulse: 70  Temp: 97.8 F (36.6 C)  SpO2: 96%  Weight: 157 lb 6.4 oz (71.4 kg)  Height: 5\' 9"  (1.753 m)   Body mass index is 23.24 kg/m. Physical Exam Constitutional:      General: She is not in acute distress.    Appearance: She is not ill-appearing or toxic-appearing.     Comments: Put together well with help of CNAs  HENT:     Head:  Normocephalic and atraumatic.  Cardiovascular:     Rate and Rhythm: Normal rate and regular rhythm.     Heart sounds: Murmur present.  Pulmonary:     Effort: Pulmonary effort is normal.     Breath sounds: Normal breath sounds. No rales.  Abdominal:     General: Bowel sounds are normal.     Palpations: Abdomen is soft.  Musculoskeletal:        General: Normal range of motion.     Right lower leg: No edema.     Left lower leg: No edema.  Skin:    General: Skin is warm and dry.  Neurological:     Mental Status: She is alert. Mental status is at baseline. She is disoriented.     Gait: Gait abnormal.     Comments: Action tremor  Psychiatric:        Mood and Affect: Mood normal.     Labs reviewed: Recent Labs    02/26/19 0152 03/05/19 0600  NA 142 139  K 3.4 3.6  BUN 28* 26*  CREATININE 1.0 0.9   Recent Labs    02/26/19 0152  AST 28  ALT 49*  ALKPHOS 119   Recent Labs    02/26/19 0152  WBC 9.2  HGB 11.9*  HCT 35*  PLT 176   Lab Results  Component Value Date   TSH 2.84 10/25/2018   No results found for: HGBA1C Lab Results  Component Value Date   CHOL 244 (A) 04/19/2018   HDL 54 04/19/2018   LDLCALC 168 04/19/2018   TRIG 110 04/19/2018    Assessment/Plan 1. Late onset Alzheimer's disease with behavioral disturbance (HCC) -moderate stage but requires 24x7 supervision, risk of wandering -doing great in memory care and I certainly recommend she stay here -cont aricept  2. Pulmonary HTN ([place]) -much improved on regular lasix and potassium -f/u bmp  3. Hypothyroidism, unspecified type -on low dose of 35mcg levothyroxine daily -needs TSH rechecked since it's been a year Lab Results  Component Value Date   TSH 2.84 10/25/2018    4. Hiatal hernia with GERD -no recent complaints, on prilosec daily  5. Senile osteoporosis -cont vitamins, encourage walking for weightbearing exercise and participation in any exercise programs -has opted not to take  prescriptions for this  6. Benign essential tremor -remains, cont propranolol, has had orthostasis so we're limited in further mgt of this like with primidone  7. Recurrent  major depressive disorder, in partial remission ([place]) -reduced prozac to 20mg  daily to see how that goes, cont trazodone and melatonin for sleep  Family/ staff Communication: discussed with snf nurse and activities  Labs/tests ordered:  Tsh, bmp  [place] Sorn L. Alezander Dimaano, D.O. [facility] [address], [place] [address] Cell Phone (Mon-Fri 8am-5pm):  [phone] On Call:  [phone] & follow prompts after 5pm & weekends Office Phone:  [phone] Office Fax:  [phone]

## 2019-12-17 DIAGNOSIS — M79672 Pain in left foot: Secondary | ICD-10-CM | POA: Diagnosis not present

## 2019-12-17 DIAGNOSIS — M79671 Pain in right foot: Secondary | ICD-10-CM | POA: Diagnosis not present

## 2019-12-17 DIAGNOSIS — B351 Tinea unguium: Secondary | ICD-10-CM | POA: Diagnosis not present

## 2020-01-14 ENCOUNTER — Non-Acute Institutional Stay (SKILLED_NURSING_FACILITY): Payer: Medicare Other | Admitting: Internal Medicine

## 2020-01-14 ENCOUNTER — Encounter: Payer: Self-pay | Admitting: Internal Medicine

## 2020-01-14 DIAGNOSIS — G301 Alzheimer's disease with late onset: Secondary | ICD-10-CM | POA: Diagnosis not present

## 2020-01-14 DIAGNOSIS — I272 Pulmonary hypertension, unspecified: Secondary | ICD-10-CM

## 2020-01-14 DIAGNOSIS — G250 Essential tremor: Secondary | ICD-10-CM | POA: Diagnosis not present

## 2020-01-14 DIAGNOSIS — I10 Essential (primary) hypertension: Secondary | ICD-10-CM

## 2020-01-14 DIAGNOSIS — F02818 Dementia in other diseases classified elsewhere, unspecified severity, with other behavioral disturbance: Secondary | ICD-10-CM

## 2020-01-14 DIAGNOSIS — H353130 Nonexudative age-related macular degeneration, bilateral, stage unspecified: Secondary | ICD-10-CM

## 2020-01-14 DIAGNOSIS — K449 Diaphragmatic hernia without obstruction or gangrene: Secondary | ICD-10-CM

## 2020-01-14 DIAGNOSIS — K219 Gastro-esophageal reflux disease without esophagitis: Secondary | ICD-10-CM

## 2020-01-14 DIAGNOSIS — M810 Age-related osteoporosis without current pathological fracture: Secondary | ICD-10-CM

## 2020-01-14 DIAGNOSIS — I872 Venous insufficiency (chronic) (peripheral): Secondary | ICD-10-CM | POA: Diagnosis not present

## 2020-01-14 DIAGNOSIS — F0281 Dementia in other diseases classified elsewhere with behavioral disturbance: Secondary | ICD-10-CM

## 2020-01-14 DIAGNOSIS — E039 Hypothyroidism, unspecified: Secondary | ICD-10-CM | POA: Diagnosis not present

## 2020-01-14 NOTE — Progress Notes (Signed)
Location:  [facility] Room Number: [place] of Service:  ALF (13)  Provider: Irelynn Schermerhorn L. Mariea Clonts, D.O., C.M.D.  PCP: Gayland Curry, DO Patient Care Team: Gayland Curry, DO as PCP - General (Geriatric Medicine) Jerline Pain, MD as PCP - Cardiology (Cardiology)  Extended Emergency Contact Information Primary Emergency Contact: Su Monks Address: [address]          [place], [place] [address] Johnnette Litter of [place] Work Phone: [phone] Mobile Phone: [phone] Relation: Son Secondary Emergency Contact: Flonnie Overman Address: [address]          [place], [place] [address] Johnnette Litter of [place] Mobile Phone: [phone] Relation: Relative  Code Status: DNR, MOST Goals of care:  Advanced Directive information Advanced Directives 01/14/2020  Does Patient Have a Medical Advance Directive? Yes  Type of Advance Directive Out of facility DNR (pink MOST or yellow form)  Does patient want to make changes to medical advance directive? No - Patient declined  Copy of [place] in Chart? -  Pre-existing out of facility DNR order (yellow form or pink MOST form) Pink MOST/Yellow Form most recent copy in chart - Physician notified to receive inpatient order     Allergies  Allergen Reactions  . Garlic   . Lac Bovis   . Lanolin   . Penicillins     ? rash  . Statins     myalgia    Chief Complaint  Patient presents with  . Discharge Note    Dishcharge to home with family from [place]. Follow up with Dr. Mariea Clonts in 1 to 2 weeks.     HPI:  86 y.o. female  with h/o advanced Alzheimer's dementia with behavioral disturbance (paranoid delusions, hoarding), pulmonary htn (severely elevated RVSP 85 mmHg on echo with aortic regurg 02/28/19), htn, chronic venous insufficiency, hiatal hernia with GERD, hypothyroidism, essential tremor, senile osteoporosis, lumbar DDD, hyperlipidemia, depression, dry eyes and macular  degeneration seen for discharge home with her son and daughter-in-law.   They are prepared to provide 24x7 care and nursing staff has been working closely with them telling them about her current habits and routines that have worked well.  We are all a bit concerned about Ramsey moving back home based on her state when she arrived here and due to her doing so well while here in memory care socializing with her colleagues.   Past Medical History:  Diagnosis Date  . Benign essential tremor 07/18/2018  . Bilateral nonexudative age-related macular degeneration 07/18/2018  . Cholestatic hepatitis    2001  . DDD (degenerative disc disease), lumbar 04/23/2018  . Depression   . Dry eyes, bilateral 07/18/2018  . Fatigue   . GERD (gastroesophageal reflux disease)   . Hiatal hernia with GERD 07/29/2011  . Hoarding behavior 04/23/2018  . HTN (hypertension) 07/29/2011  . Hyperlipidemia   . Hypertension   . Hypothyroidism 07/29/2011  . Insomnia   . Late onset Alzheimer's disease with behavioral disturbance ([place]) 04/23/2018  . LBBB (left bundle branch block) 07/29/2011  . Major depressive disorder 07/29/2016  . Memory impairment 12/09/2016  . Memory loss   . Osteoporosis   . Paranoid delusion ([place]) 07/18/2018  . Psychophysiological insomnia 04/23/2018  . Pulmonary HTN ([place]) 03/15/2019   2 d echo 02/28/19 EF 55-60 %, mild to moderate mitral valve regurg, mild tricuspid regurg, severely elevated estimated right ventricular systolic pressure 85 mm Hg, and mild to moderate aortic valve regurg. No  diastolic CHF noted.   . Pure hypercholesterolemia 07/29/2011  . Sciatica   . Senile osteoporosis 04/23/2018    Past Surgical History:  Procedure Laterality Date  . APPENDECTOMY    . CARDIAC CATHETERIZATION    . FACIAL COSMETIC SURGERY    . TONSILLECTOMY    . TOTAL ABDOMINAL HYSTERECTOMY W/ BILATERAL SALPINGOOPHORECTOMY        reports that she has quit smoking. Her smoking use included cigarettes. She has a 10.00  pack-year smoking history. She has never used smokeless tobacco. She reports previous alcohol use of about 1.0 standard drinks of alcohol per week. She reports that she does not use drugs.  Functional Status Survey:    Allergies  Allergen Reactions  . Garlic   . Lac Bovis   . Lanolin   . Penicillins     ? rash  . Statins     myalgia    Pertinent  Health Maintenance Due  Topic Date Due  . INFLUENZA VACCINE  05/17/2020  . DEXA SCAN  Completed  . PNA vac Low Risk Adult  Completed    Medications: Outpatient Encounter Medications as of 01/14/2020  Medication Sig  . Calcium Carbonate-Vitamin D 600-400 MG-UNIT chew tablet Chew 1 tablet by mouth 3 (three) times daily with meals.  . Cholecalciferol (D3 HIGH POTENCY) 2000 units CAPS Take 1 capsule (2,000 Units total) by mouth daily.  Marland Kitchen donepezil (ARICEPT) 10 MG tablet Take 1 tablet (10 mg total) by mouth at bedtime.  Marland Kitchen FLUoxetine (PROZAC) 40 MG capsule Take 20 mg by mouth daily.  . furosemide (LASIX) 40 MG tablet Take 40 mg by mouth daily.  . irbesartan (AVAPRO) 300 MG tablet Take 300 mg by mouth daily.  Marland Kitchen levothyroxine (SYNTHROID, LEVOTHROID) 25 MCG tablet Take 25 mcg by mouth at bedtime.  . Melatonin 10 MG TABS Take 1 tablet by mouth at bedtime.  . Multiple Vitamins-Minerals (PRESERVISION AREDS 2) CAPS Take 1 capsule by mouth 2 (two) times daily.  Marland Kitchen omeprazole (PRILOSEC) 20 MG capsule Take 20 mg by mouth daily.   . potassium chloride SA (K-DUR) 20 MEQ tablet Take 20 mEq by mouth daily.  . propranolol ER (INDERAL LA) 60 MG 24 hr capsule Take 1 capsule (60 mg total) by mouth daily.  . traZODone (DESYREL) 50 MG tablet Take 50 mg by mouth at bedtime.   . triamcinolone cream (KENALOG) 0.1 % Apply 1 application topically as needed.  . vitamin C (ASCORBIC ACID) 500 MG tablet Take 1 tablet (500 mg total) by mouth daily.   No facility-administered encounter medications on file as of 01/14/2020.    Review of Systems  Constitutional:  Negative for chills, fever and weight loss.  HENT: Negative for congestion and sore throat.   Eyes: Negative for blurred vision.  Respiratory: Negative for cough, shortness of breath and wheezing.   Cardiovascular: Negative for chest pain, palpitations and leg swelling.       Edema ok with lasix and compression  Gastrointestinal: Negative for abdominal pain, blood in stool, constipation, diarrhea, heartburn and melena.  Genitourinary: Negative for dysuria.       Incontinence--requires regular toileting   Musculoskeletal: Negative for falls and joint pain.  Skin: Negative for itching and rash.  Neurological: Positive for tremors. Negative for dizziness and loss of consciousness.  Endo/Heme/Allergies: Bruises/bleeds easily.  Psychiatric/Behavioral: Positive for memory loss. Negative for depression. The patient has insomnia. The patient is not nervous/anxious.     Vitals:   01/14/20 1037  BP: 140/68  Pulse: 67  Temp: (!) 97.1 F (36.2 C)  SpO2: 95%  Weight: 159 lb 6.4 oz (72.3 kg)  Height: 5\' 9"  (1.753 m)   Body mass index is 23.54 kg/m. Physical Exam Vitals reviewed.  Constitutional:      General: She is not in acute distress.    Appearance: Normal appearance. She is not toxic-appearing.  HENT:     Head: Normocephalic and atraumatic.     Right Ear: External ear normal.     Mouth/Throat:     Pharynx: Oropharynx is clear.  Eyes:     Extraocular Movements: Extraocular movements intact.     Pupils: Pupils are equal, round, and reactive to light.  Cardiovascular:     Rate and Rhythm: Normal rate and regular rhythm.     Pulses: Normal pulses.     Heart sounds: Normal heart sounds.  Pulmonary:     Effort: Pulmonary effort is normal.     Breath sounds: Normal breath sounds. No wheezing, rhonchi or rales.  Abdominal:     General: Bowel sounds are normal.     Palpations: Abdomen is soft.     Tenderness: There is no abdominal tenderness.  Musculoskeletal:        General:  Normal range of motion.     Cervical back: Neck supple.     Right lower leg: Edema present.     Left lower leg: Edema present.     Comments: Mild nonpitting edema of feet and lower legs  Skin:    General: Skin is warm and dry.     Capillary Refill: Capillary refill takes less than 2 seconds.  Neurological:     General: No focal deficit present.     Mental Status: She is alert. Mental status is at baseline.     Cranial Nerves: No cranial nerve deficit.     Gait: Gait abnormal.     Comments: Action tremor, rapid  Psychiatric:        Mood and Affect: Mood normal.     Labs reviewed: Basic Metabolic Panel: Recent Labs    02/26/19 0152 03/05/19 0600  NA 142 139  K 3.4 3.6  BUN 28* 26*  CREATININE 1.0 0.9   Liver Function Tests: Recent Labs    02/26/19 0152  AST 28  ALT 49*  ALKPHOS 119   No results for input(s): LIPASE, AMYLASE in the last 8760 hours. No results for input(s): AMMONIA in the last 8760 hours. CBC: Recent Labs    02/26/19 0152  WBC 9.2  HGB 11.9*  HCT 35*  PLT 176   Cardiac Enzymes: No results for input(s): CKTOTAL, CKMB, CKMBINDEX, TROPONINI in the last 8760 hours. BNP: Invalid input(s): POCBNP CBG: No results for input(s): GLUCAP in the last 8760 hours.  Procedures and Imaging Studies During Stay: No results found.  Assessment/Plan:   1. Late onset Alzheimer's disease with behavioral disturbance ([place]) -continues on aricept 10mg  qhs, has trazodone for sleep and melatonin, also takes prozac for mood, has done ok w/o antipsychotic here in memory care (had been on one in [place]--seroquel--before her move here)  2. Pulmonary HTN ([place]) -doing better since diuretic begun to manage this -had eval with Dr. Marlou Porch  3. Essential hypertension -bp at goal with current regimen which is irbesartan 300mg  daily, the lasix (for pulm htn), and her propranolol (for tremor)  4. Chronic venous insufficiency -cont her lasix for her pulmonary htn and compression  hose on in am and off at hs  5. Hiatal  hernia with GERD -continue omeprazole chronically for this, avoid eating just before bed and avoid trigger foods  6. Hypothyroidism, unspecified type -continue low dose levothyroxine and f/u labs when I see her at [facility] Lab Results  Component Value Date   TSH 2.84 10/25/2018    7. Benign essential tremor -continues on propranolol which has helped and also good for her bp  8. Senile osteoporosis -continues on ca with D and additional D3 daily, not on other osteoporosis meds  -remains ambulatory, but cost an issue in memory care unit  9. Bilateral nonexudative age-related macular degeneration, unspecified stage -continues on preservision areds2  She will f/u with me at [facility] for outpatient primary care geriatrics on 01/27/2020.  Future labs/tests needed: will plan on labs at outpatient visit as almost a year since last set  [place]. Errin Whitelaw, D.O. [facility] [address], [place] [address] Cell Phone (Mon-Fri 8am-5pm):  [phone] On Call:  [phone] & follow prompts after 5pm & weekends Office Phone:  [phone] Office Fax:  [phone]

## 2020-01-27 ENCOUNTER — Ambulatory Visit (INDEPENDENT_AMBULATORY_CARE_PROVIDER_SITE_OTHER): Payer: Medicare Other | Admitting: Internal Medicine

## 2020-01-27 ENCOUNTER — Encounter: Payer: Self-pay | Admitting: Internal Medicine

## 2020-01-27 ENCOUNTER — Other Ambulatory Visit: Payer: Self-pay

## 2020-01-27 VITALS — BP 130/68 | HR 53 | Temp 96.6°F | Resp 18 | Ht 69.0 in | Wt 157.0 lb

## 2020-01-27 DIAGNOSIS — I10 Essential (primary) hypertension: Secondary | ICD-10-CM

## 2020-01-27 DIAGNOSIS — I272 Pulmonary hypertension, unspecified: Secondary | ICD-10-CM

## 2020-01-27 DIAGNOSIS — F0281 Dementia in other diseases classified elsewhere with behavioral disturbance: Secondary | ICD-10-CM

## 2020-01-27 DIAGNOSIS — G250 Essential tremor: Secondary | ICD-10-CM

## 2020-01-27 DIAGNOSIS — I872 Venous insufficiency (chronic) (peripheral): Secondary | ICD-10-CM

## 2020-01-27 DIAGNOSIS — E039 Hypothyroidism, unspecified: Secondary | ICD-10-CM | POA: Diagnosis not present

## 2020-01-27 DIAGNOSIS — G301 Alzheimer's disease with late onset: Secondary | ICD-10-CM

## 2020-01-27 DIAGNOSIS — F02818 Dementia in other diseases classified elsewhere, unspecified severity, with other behavioral disturbance: Secondary | ICD-10-CM

## 2020-01-27 NOTE — Progress Notes (Signed)
Location:  [facility] clinic Provider:  Lindon Kiel L. Mariea Clonts, D.O., C.M.D.  Code Status: DNR Goals of Care:  Advanced Directives 01/27/2020  Does Patient Have a Medical Advance Directive? Yes  Type of Paramedic of [place];Out of facility DNR (pink MOST or yellow form)  Does patient want to make changes to medical advance directive? No - Patient declined  Copy of [place] in Chart? Yes - validated most recent copy scanned in chart (See row information)  Pre-existing out of facility DNR order (yellow form or pink MOST form) Pink MOST/Yellow Form most recent copy in chart - Physician notified to receive inpatient order     Chief Complaint  Patient presents with  . Follow-up    Follow up from [facility] discharge.    HPI: Patient is a 86 y.o. female seen today for medical management of chronic diseases.  She left [facility] memory care end of last month.  She has been staying with her son and daughter-in-law and her dog.    She's been there 11 days.  She is sleeping well.  They've taken some walks around [place].  Her walking seems to be improving.  They are listening to old classic music, watching tv, movies.  Her eldest granddaughter is visiting tomorrow night.    They are at the beginning phase.  They are renting a house.  They've had some meals outside.    Lots of scrabble games.    Her son requests a private consult about the medications at the end.  [facility] are [facility] and then [facility].    There has been some wandering at night--two episodes at 11am and 1pm.  Aretha Parrot is downstairs and her son and daughter-in-law are upstairs.  She was looking for Max who's in a mudroom.  There have been 3-4 total episodes.  There is not an alarm that would beep but a locking system that's significant to keep her from getting out.    She ate cereal with fruit and tea for breakfast.  Had [place] and spinach sandwich with  macaroni at lunch.  They also try to eat salads with meat included. Sundays are pizza night.    No falls.  No pain.    Tremor comes and goes.    Compression hose:  They were concerned that they were not being put on regularly.  It is challenging to get them on.  Daughter-in-law is putting them on.    Last night she was walking stooped over backwards at 1am looking for Max.  She is used to staying up late.  The dog is incontinent also.    Past Medical History:  Diagnosis Date  . Benign essential tremor 07/18/2018  . Bilateral nonexudative age-related macular degeneration 07/18/2018  . Cholestatic hepatitis    2001  . DDD (degenerative disc disease), lumbar 04/23/2018  . Depression   . Dry eyes, bilateral 07/18/2018  . Fatigue   . GERD (gastroesophageal reflux disease)   . Hiatal hernia with GERD 07/29/2011  . Hoarding behavior 04/23/2018  . HTN (hypertension) 07/29/2011  . Hyperlipidemia   . Hypertension   . Hypothyroidism 07/29/2011  . Insomnia   . Late onset Alzheimer's disease with behavioral disturbance ([place]) 04/23/2018  . LBBB (left bundle branch block) 07/29/2011  . Major depressive disorder 07/29/2016  . Memory impairment 12/09/2016  . Memory loss   . Osteoporosis   . Paranoid delusion ([place]) 07/18/2018  . Psychophysiological insomnia 04/23/2018  .  Pulmonary HTN ([place]) 03/15/2019   2 d echo 02/28/19 EF 55-60 %, mild to moderate mitral valve regurg, mild tricuspid regurg, severely elevated estimated right ventricular systolic pressure 85 mm Hg, and mild to moderate aortic valve regurg. No diastolic CHF noted.   . Pure hypercholesterolemia 07/29/2011  . Sciatica   . Senile osteoporosis 04/23/2018    Past Surgical History:  Procedure Laterality Date  . APPENDECTOMY    . CARDIAC CATHETERIZATION    . FACIAL COSMETIC SURGERY    . TONSILLECTOMY    . TOTAL ABDOMINAL HYSTERECTOMY W/ BILATERAL SALPINGOOPHORECTOMY      Allergies  Allergen Reactions  . Garlic   . Lac Bovis   . Lanolin    . Penicillins     ? rash  . Statins     myalgia    Outpatient Encounter Medications as of 01/27/2020  Medication Sig  . Calcium Carbonate-Vitamin D 600-400 MG-UNIT chew tablet Chew 1 tablet by mouth 3 (three) times daily with meals.  . Cholecalciferol (D3 HIGH POTENCY) 2000 units CAPS Take 1 capsule (2,000 Units total) by mouth daily.  Marland Kitchen donepezil (ARICEPT) 10 MG tablet Take 1 tablet (10 mg total) by mouth at bedtime.  Marland Kitchen FLUoxetine (PROZAC) 40 MG capsule Take 20 mg by mouth daily.  . furosemide (LASIX) 40 MG tablet Take 40 mg by mouth daily.  . irbesartan (AVAPRO) 300 MG tablet Take 300 mg by mouth daily.  Marland Kitchen levothyroxine (SYNTHROID, LEVOTHROID) 25 MCG tablet Take 25 mcg by mouth at bedtime.  . Melatonin 10 MG TABS Take 1 tablet by mouth at bedtime.  . Multiple Vitamins-Minerals (PRESERVISION AREDS 2) CAPS Take 1 capsule by mouth 2 (two) times daily.  Marland Kitchen omeprazole (PRILOSEC) 20 MG capsule Take 20 mg by mouth daily.   . potassium chloride SA (K-DUR) 20 MEQ tablet Take 20 mEq by mouth daily.  . propranolol ER (INDERAL LA) 60 MG 24 hr capsule Take 1 capsule (60 mg total) by mouth daily.  . traZODone (DESYREL) 50 MG tablet Take 50 mg by mouth at bedtime.   . triamcinolone cream (KENALOG) 0.1 % Apply 1 application topically as needed.  . vitamin C (ASCORBIC ACID) 500 MG tablet Take 1 tablet (500 mg total) by mouth daily.   No facility-administered encounter medications on file as of 01/27/2020.    Review of Systems:  Review of Systems  Constitutional: Negative for chills and fever.  HENT: Positive for hearing loss.   Eyes: Negative for blurred vision.  Respiratory: Negative for cough and shortness of breath.   Cardiovascular: Negative for chest pain, palpitations and leg swelling.       Daughter-in-law is struggling to get hose on her  Gastrointestinal: Negative for abdominal pain, blood in stool, constipation, diarrhea and melena.  Genitourinary: Negative for dysuria.        Incontinence   Musculoskeletal: Negative for falls and joint pain.  Skin: Negative for itching and rash.  Neurological: Positive for tremors. Negative for dizziness and loss of consciousness.  Endo/Heme/Allergies: Does not bruise/bleed easily.  Psychiatric/Behavioral: Positive for memory loss. Negative for depression. The patient is not nervous/anxious and does not have insomnia.        Has been up at night somewhere b/w 2-4 times in 11 days since returning to home     Health Maintenance  Topic Date Due  . INFLUENZA VACCINE  05/17/2020  . TETANUS/TDAP  05/04/2028  . DEXA SCAN  Completed  . PNA vac Low Risk Adult  Completed  Physical Exam: Vitals:   01/27/20 1354  BP: 130/68  Pulse: (!) 53  Resp: 18  Temp: 99.8 F (37.7 C)  SpO2: 93%  Weight: 157 lb (71.2 kg)  Height: 5\' 9"  (1.753 m)   Body mass index is 23.18 kg/m. Physical Exam Vitals reviewed.  Constitutional:      General: She is not in acute distress.    Appearance: She is not toxic-appearing.     Comments: Has very little makeup on this morning   HENT:     Head: Normocephalic and atraumatic.     Ears:     Comments: HOH Cardiovascular:     Rate and Rhythm: Normal rate and regular rhythm.     Pulses: Normal pulses.     Heart sounds: Normal heart sounds.  Pulmonary:     Effort: Pulmonary effort is normal.     Breath sounds: Normal breath sounds.  Abdominal:     General: Bowel sounds are normal.  Musculoskeletal:        General: Normal range of motion.     Right lower leg: Edema present.     Left lower leg: Edema present.     Comments: Mild nonpitting edema  Skin:    General: Skin is warm and dry.  Neurological:     General: No focal deficit present.     Mental Status: She is alert. Mental status is at baseline.     Gait: Gait abnormal.     Comments: Ambulates w/o assistive device; has fast tremor of hands, some of head, as well, that comes and goes in severity (less prominent today)  Psychiatric:      Comments: Somewhat flat affect here today     Labs reviewed: Basic Metabolic Panel: Recent Labs    02/26/19 0152 03/05/19 0600  NA 142 139  K 3.4 3.6  BUN 28* 26*  CREATININE 1.0 0.9   Liver Function Tests: Recent Labs    02/26/19 0152  AST 28  ALT 49*  ALKPHOS 119   No results for input(s): LIPASE, AMYLASE in the last 8760 hours. No results for input(s): AMMONIA in the last 8760 hours. CBC: Recent Labs    02/26/19 0152  WBC 9.2  HGB 11.9*  HCT 35*  PLT 176    Assessment/Plan 1. Late onset Alzheimer's disease with behavioral disturbance ([place]) -advanced stage requiring assistance with daily adls (at least cues to perform) -now staying at home with family -today discussed safety concerns:  Recommended alzheimer's safe return program, life alert system, baby monitor use due to concerns about night time wandering, son plans to use signage about the dog's location, as well; also mentioned alarms for doors  -continue aricept  2. Pulmonary HTN ([place]) -has not been problematic since bp controlled and on diuretic daily with potassium supplement, cont to monitor  3. Chronic venous insufficiency -given new Rx for compression hose as some they had were mismatched and we did some for thigh-high and some knee-high variety for warmer weather -importance emphasized  4. Essential hypertension -bp is well-controlled with ARB, diuretic, plus her tremor beta blocker  5. Hypothyroidism, unspecified type -cont current levothyroxine 81mcg daily Lab Results  Component Value Date   TSH 2.84 10/25/2018   6. Benign essential tremor -has good and bad days with this -discussed impact of caffeine vs alcohol on tremors and did say she could have one glass of wine with family if desired like she would have at [facility]  Labs/tests ordered:   Lab Orders  COMPLETE METABOLIC PANEL WITH GFR     CBC with Differential/Platelet     TSH   Next appt:  3 mos for med mgt, same day non-fasting  labs   Jalaila Caradonna L. Ivory Maduro, D.O. [facility] [address], [place] [address] Cell Phone (Mon-Fri 8am-5pm):  [phone] On Call:  [phone] & follow prompts after 5pm & weekends Office Phone:  [phone] Office Fax:  [phone]

## 2020-01-27 NOTE — Patient Instructions (Addendum)
Safe Return program through [employer].  Life alert or similar.

## 2020-01-31 ENCOUNTER — Other Ambulatory Visit: Payer: Self-pay | Admitting: *Deleted

## 2020-01-31 MED ORDER — MELATONIN 10 MG PO TABS: 1.0000 | ORAL_TABLET | Freq: Every day | ORAL | 1 refills | Status: AC

## 2020-01-31 MED ORDER — POTASSIUM CHLORIDE CRYS ER 20 MEQ PO TBCR: 20.0000 meq | EXTENDED_RELEASE_TABLET | Freq: Every day | ORAL | 1 refills | Status: DC

## 2020-01-31 MED ORDER — DONEPEZIL HCL 10 MG PO TABS: 10.0000 mg | ORAL_TABLET | Freq: Every day | ORAL | 1 refills | Status: DC

## 2020-01-31 MED ORDER — FLUOXETINE HCL 20 MG PO TABS: 20.0000 mg | ORAL_TABLET | Freq: Every day | ORAL | 1 refills | Status: DC

## 2020-01-31 MED ORDER — IRBESARTAN 300 MG PO TABS: 300.0000 mg | ORAL_TABLET | Freq: Every day | ORAL | 1 refills | Status: DC

## 2020-01-31 MED ORDER — LEVOTHYROXINE SODIUM 25 MCG PO TABS: 25.0000 ug | ORAL_TABLET | Freq: Every day | ORAL | 1 refills | Status: DC

## 2020-01-31 MED ORDER — PROPRANOLOL HCL ER 60 MG PO CP24: 60.0000 mg | ORAL_CAPSULE | Freq: Every day | ORAL | 1 refills | Status: DC

## 2020-01-31 MED ORDER — TRAZODONE HCL 50 MG PO TABS: 50.0000 mg | ORAL_TABLET | Freq: Every day | ORAL | 1 refills | Status: DC

## 2020-01-31 MED ORDER — OMEPRAZOLE 20 MG PO CPDR: 20.0000 mg | DELAYED_RELEASE_CAPSULE | Freq: Every day | ORAL | 1 refills | Status: DC

## 2020-01-31 MED ORDER — FUROSEMIDE 40 MG PO TABS: 40.0000 mg | ORAL_TABLET | Freq: Every day | ORAL | 1 refills | Status: DC

## 2020-01-31 NOTE — Telephone Encounter (Signed)
Patient son called and stated that patient was seen on Monday after being discharged from [facility]. Patient is now living in his home.  Stated that they had requested all medication to be faxed to [facility] in [place]. Stated that they have been checking with the pharmacy all week and no Rx's have been sent. Requesting 90 day supply.  Pended Rx's and sent to Dr. Mariea Clonts for approval.

## 2020-03-02 DIAGNOSIS — Z961 Presence of intraocular lens: Secondary | ICD-10-CM | POA: Diagnosis not present

## 2020-03-02 DIAGNOSIS — Z9849 Cataract extraction status, unspecified eye: Secondary | ICD-10-CM | POA: Diagnosis not present

## 2020-03-31 DIAGNOSIS — H35033 Hypertensive retinopathy, bilateral: Secondary | ICD-10-CM | POA: Diagnosis not present

## 2020-03-31 DIAGNOSIS — H35373 Puckering of macula, bilateral: Secondary | ICD-10-CM | POA: Diagnosis not present

## 2020-03-31 DIAGNOSIS — H353221 Exudative age-related macular degeneration, left eye, with active choroidal neovascularization: Secondary | ICD-10-CM | POA: Diagnosis not present

## 2020-03-31 DIAGNOSIS — H31091 Other chorioretinal scars, right eye: Secondary | ICD-10-CM | POA: Diagnosis not present

## 2020-04-02 ENCOUNTER — Other Ambulatory Visit: Payer: Self-pay

## 2020-04-02 ENCOUNTER — Ambulatory Visit (INDEPENDENT_AMBULATORY_CARE_PROVIDER_SITE_OTHER): Payer: Medicare Other | Admitting: Podiatry

## 2020-04-02 ENCOUNTER — Encounter: Payer: Self-pay | Admitting: Podiatry

## 2020-04-02 DIAGNOSIS — Q828 Other specified congenital malformations of skin: Secondary | ICD-10-CM

## 2020-04-02 DIAGNOSIS — B351 Tinea unguium: Secondary | ICD-10-CM

## 2020-04-02 DIAGNOSIS — M79676 Pain in unspecified toe(s): Secondary | ICD-10-CM

## 2020-04-04 NOTE — Progress Notes (Signed)
Subjective:  Patient ID: April Stevenson, female    DOB: October 31, 1923,  MRN: [id number] HPI Chief Complaint  Patient presents with  . Debridement    Trim toenails    86 y.o. female presents with the above complaint.   ROS: Denies fever chills nausea vomiting muscle aches pains.  Past Medical History:  Diagnosis Date  . Benign essential tremor 07/18/2018  . Bilateral nonexudative age-related macular degeneration 07/18/2018  . Cholestatic hepatitis    2001  . DDD (degenerative disc disease), lumbar 04/23/2018  . Depression   . Dry eyes, bilateral 07/18/2018  . Fatigue   . GERD (gastroesophageal reflux disease)   . Hiatal hernia with GERD 07/29/2011  . Hoarding behavior 04/23/2018  . HTN (hypertension) 07/29/2011  . Hyperlipidemia   . Hypertension   . Hypothyroidism 07/29/2011  . Insomnia   . Late onset Alzheimer's disease with behavioral disturbance ([place]) 04/23/2018  . LBBB (left bundle branch block) 07/29/2011  . Major depressive disorder 07/29/2016  . Memory impairment 12/09/2016  . Memory loss   . Osteoporosis   . Paranoid delusion ([place]) 07/18/2018  . Psychophysiological insomnia 04/23/2018  . Pulmonary HTN ([place]) 03/15/2019   2 d echo 02/28/19 EF 55-60 %, mild to moderate mitral valve regurg, mild tricuspid regurg, severely elevated estimated right ventricular systolic pressure 85 mm Hg, and mild to moderate aortic valve regurg. No diastolic CHF noted.   . Pure hypercholesterolemia 07/29/2011  . Sciatica   . Senile osteoporosis 04/23/2018   Past Surgical History:  Procedure Laterality Date  . APPENDECTOMY    . CARDIAC CATHETERIZATION    . FACIAL COSMETIC SURGERY    . TONSILLECTOMY    . TOTAL ABDOMINAL HYSTERECTOMY W/ BILATERAL SALPINGOOPHORECTOMY      Current Outpatient Medications:  .  Calcium Carbonate-Vitamin D 600-400 MG-UNIT chew tablet, Chew 1 tablet by mouth 3 (three) times daily with meals., Disp: 60 tablet, Rfl: 3 .  Cholecalciferol (D3 HIGH POTENCY) 2000  units CAPS, Take 1 capsule (2,000 Units total) by mouth daily., Disp: 30 each, Rfl: 3 .  donepezil (ARICEPT) 10 MG tablet, Take 1 tablet (10 mg total) by mouth at bedtime., Disp: 90 tablet, Rfl: 1 .  FLUoxetine (PROZAC) 20 MG tablet, Take 1 tablet (20 mg total) by mouth daily., Disp: 90 tablet, Rfl: 1 .  furosemide (LASIX) 40 MG tablet, Take 1 tablet (40 mg total) by mouth daily., Disp: 90 tablet, Rfl: 1 .  irbesartan (AVAPRO) 300 MG tablet, Take 1 tablet (300 mg total) by mouth daily., Disp: 90 tablet, Rfl: 1 .  levothyroxine (SYNTHROID) 25 MCG tablet, Take 1 tablet (25 mcg total) by mouth at bedtime., Disp: 90 tablet, Rfl: 1 .  Melatonin 10 MG TABS, Take 1 tablet by mouth at bedtime., Disp: 90 tablet, Rfl: 1 .  Multiple Vitamins-Minerals (PRESERVISION AREDS 2) CAPS, Take 1 capsule by mouth 2 (two) times daily., Disp: , Rfl:  .  omeprazole (PRILOSEC) 20 MG capsule, Take 1 capsule (20 mg total) by mouth daily., Disp: 90 capsule, Rfl: 1 .  potassium chloride SA (KLOR-CON) 20 MEQ tablet, Take 1 tablet (20 mEq total) by mouth daily., Disp: 90 tablet, Rfl: 1 .  propranolol ER (INDERAL LA) 60 MG 24 hr capsule, Take 1 capsule (60 mg total) by mouth daily., Disp: 90 capsule, Rfl: 1 .  traZODone (DESYREL) 50 MG tablet, Take 1 tablet (50 mg total) by mouth at bedtime., Disp: 90 tablet, Rfl: 1 .  triamcinolone cream (KENALOG) 0.1 %, Apply 1 application topically as  needed., Disp: , Rfl:  .  vitamin C (ASCORBIC ACID) 500 MG tablet, Take 1 tablet (500 mg total) by mouth daily., Disp: 30 tablet, Rfl: 11  Allergies  Allergen Reactions  . Garlic   . Lac Bovis   . Lanolin   . Penicillins     ? rash  . Statins     myalgia   Review of Systems Objective:  There were no vitals filed for this visit.  General: Well developed, nourished, in no acute distress, alert and oriented x3   Dermatological: Skin is warm, dry and supple bilateral. Nails x 10 are thick yellow dystrophic clinically mycotic painful  palpation as well as debridement.; remaining integument appears unremarkable at this time. There are no open sores, no preulcerative lesions, no rash or signs of infection present.  Vascular: Dorsalis Pedis artery and Posterior Tibial artery pedal pulses are 2/4 bilateral with immedate capillary fill time. Pedal hair growth present. No varicosities and no lower extremity edema present bilateral.   Neruologic: Grossly intact via light touch bilateral. Vibratory intact via tuning fork bilateral. Protective threshold with Semmes Wienstein monofilament intact to all pedal sites bilateral. Patellar and Achilles deep tendon reflexes 2+ bilateral. No Babinski or clonus noted bilateral.   Musculoskeletal: No gross boney pedal deformities bilateral. No pain, crepitus, or limitation noted with foot and ankle range of motion bilateral. Muscular strength 5/5 in all groups tested bilateral.  Gait: Unassisted, Nonantalgic.    Radiographs:  None taken  Assessment & Plan:   Assessment: Pain in limb secondary to onychomycosis.  Plan: Debridement of toenails 1 through 5 bilateral today no iatrogenic lesions.  Follow-up with her with Dr. Ahmed Prima T. [place], [place]

## 2020-05-12 DIAGNOSIS — H31091 Other chorioretinal scars, right eye: Secondary | ICD-10-CM | POA: Diagnosis not present

## 2020-05-12 DIAGNOSIS — H35033 Hypertensive retinopathy, bilateral: Secondary | ICD-10-CM | POA: Diagnosis not present

## 2020-05-12 DIAGNOSIS — H35373 Puckering of macula, bilateral: Secondary | ICD-10-CM | POA: Diagnosis not present

## 2020-05-12 DIAGNOSIS — H353232 Exudative age-related macular degeneration, bilateral, with inactive choroidal neovascularization: Secondary | ICD-10-CM | POA: Diagnosis not present

## 2020-07-13 ENCOUNTER — Ambulatory Visit: Payer: Medicare Other | Admitting: Podiatry

## 2020-07-20 ENCOUNTER — Other Ambulatory Visit: Payer: Self-pay | Admitting: Internal Medicine

## 2020-07-20 NOTE — Telephone Encounter (Signed)
Patient last visit was 01/27/2020.Marland KitchenMarland KitchenMarland KitchenNext visit is on 08/03/2020 with Dr. Mariea Clonts. rx sent to pharmacy by e-script

## 2020-07-24 ENCOUNTER — Ambulatory Visit (INDEPENDENT_AMBULATORY_CARE_PROVIDER_SITE_OTHER): Payer: Medicare Other | Admitting: Podiatry

## 2020-07-24 ENCOUNTER — Other Ambulatory Visit: Payer: Self-pay

## 2020-07-24 ENCOUNTER — Encounter: Payer: Self-pay | Admitting: Podiatry

## 2020-07-24 DIAGNOSIS — B351 Tinea unguium: Secondary | ICD-10-CM | POA: Diagnosis not present

## 2020-07-24 DIAGNOSIS — M79676 Pain in unspecified toe(s): Secondary | ICD-10-CM | POA: Diagnosis not present

## 2020-07-26 ENCOUNTER — Encounter: Payer: Self-pay | Admitting: Podiatry

## 2020-07-26 NOTE — Progress Notes (Signed)
Subjective:  Patient ID: April Stevenson, female    DOB: 1923-11-15,  MRN: [id number]  [place] presents to clinic today for painful thick toenails that are difficult to trim. Pain interferes with ambulation. Aggravating factors include wearing enclosed shoe gear. Pain is relieved with periodic professional debridement..  86 y.o. female presents with the above complaint.  Her son is present during today's visit.  Review of Systems: Negative except as noted in the HPI. Past Medical History:  Diagnosis Date  . Benign essential tremor 07/18/2018  . Bilateral nonexudative age-related macular degeneration 07/18/2018  . Cholestatic hepatitis    2001  . DDD (degenerative disc disease), lumbar 04/23/2018  . Depression   . Dry eyes, bilateral 07/18/2018  . Fatigue   . GERD (gastroesophageal reflux disease)   . Hiatal hernia with GERD 07/29/2011  . Hoarding behavior 04/23/2018  . HTN (hypertension) 07/29/2011  . Hyperlipidemia   . Hypertension   . Hypothyroidism 07/29/2011  . Insomnia   . Late onset Alzheimer's disease with behavioral disturbance ([place]) 04/23/2018  . LBBB (left bundle branch block) 07/29/2011  . Major depressive disorder 07/29/2016  . Memory impairment 12/09/2016  . Memory loss   . Osteoporosis   . Paranoid delusion ([place]) 07/18/2018  . Psychophysiological insomnia 04/23/2018  . Pulmonary HTN ([place]) 03/15/2019   2 d echo 02/28/19 EF 55-60 %, mild to moderate mitral valve regurg, mild tricuspid regurg, severely elevated estimated right ventricular systolic pressure 85 mm Hg, and mild to moderate aortic valve regurg. No diastolic CHF noted.   . Pure hypercholesterolemia 07/29/2011  . Sciatica   . Senile osteoporosis 04/23/2018   Past Surgical History:  Procedure Laterality Date  . APPENDECTOMY    . CARDIAC CATHETERIZATION    . FACIAL COSMETIC SURGERY    . TONSILLECTOMY    . TOTAL ABDOMINAL HYSTERECTOMY W/ BILATERAL SALPINGOOPHORECTOMY      Current  Outpatient Medications:  .  Calcium Carbonate-Vitamin D 600-400 MG-UNIT chew tablet, Chew 1 tablet by mouth 3 (three) times daily with meals., Disp: 60 tablet, Rfl: 3 .  Cholecalciferol (D3 HIGH POTENCY) 2000 units CAPS, Take 1 capsule (2,000 Units total) by mouth daily., Disp: 30 each, Rfl: 3 .  donepezil (ARICEPT) 10 MG tablet, TAKE 1 TABLET(10 MG) BY MOUTH AT BEDTIME, Disp: 90 tablet, Rfl: 1 .  FLUoxetine (PROZAC) 20 MG tablet, Take 1 tablet (20 mg total) by mouth daily., Disp: 90 tablet, Rfl: 1 .  furosemide (LASIX) 40 MG tablet, Take 1 tablet (40 mg total) by mouth daily., Disp: 90 tablet, Rfl: 1 .  irbesartan (AVAPRO) 300 MG tablet, Take 1 tablet (300 mg total) by mouth daily., Disp: 90 tablet, Rfl: 1 .  levothyroxine (SYNTHROID) 25 MCG tablet, Take 1 tablet (25 mcg total) by mouth at bedtime., Disp: 90 tablet, Rfl: 1 .  Melatonin 10 MG TABS, Take 1 tablet by mouth at bedtime., Disp: 90 tablet, Rfl: 1 .  Multiple Vitamins-Minerals (PRESERVISION AREDS 2) CAPS, Take 1 capsule by mouth 2 (two) times daily., Disp: , Rfl:  .  omeprazole (PRILOSEC) 20 MG capsule, TAKE 1 CAPSULE(20 MG) BY MOUTH DAILY, Disp: 90 capsule, Rfl: 1 .  potassium chloride SA (KLOR-CON) 20 MEQ tablet, Take 1 tablet (20 mEq total) by mouth daily., Disp: 90 tablet, Rfl: 1 .  propranolol ER (INDERAL LA) 60 MG 24 hr capsule, Take 1 capsule (60 mg total) by mouth daily., Disp: 90 capsule, Rfl: 1 .  traZODone (DESYREL) 50 MG tablet, Take 1 tablet (50 mg total) by  mouth at bedtime., Disp: 90 tablet, Rfl: 1 .  triamcinolone cream (KENALOG) 0.1 %, Apply 1 application topically as needed., Disp: , Rfl:  .  vitamin C (ASCORBIC ACID) 500 MG tablet, Take 1 tablet (500 mg total) by mouth daily., Disp: 30 tablet, Rfl: 11 Allergies  Allergen Reactions  . Garlic   . Lac Bovis   . Lanolin   . Penicillins     ? rash  . Statins     myalgia   Social History   Occupational History  . Occupation: model    Comment: Location manager  .  Occupation: singer  Tobacco Use  . Smoking status: Former Smoker    Packs/day: 0.50    Years: 20.00    Pack years: 10.00    Types: Cigarettes  . Smokeless tobacco: Never Used  Vaping Use  . Vaping Use: Never used  Substance and Sexual Activity  . Alcohol use: Not Currently  . Drug use: Never  . Sexual activity: Not Currently    Partners: Male    Objective:   Constitutional April Stevenson is a pleasant 86 y.o. Caucasian female, WD, WN in NAD.Marland Kitchen AAO x 3.   Vascular Capillary refill time to digits immediate b/l. Palpable pedal pulses b/l LE. Pedal hair present. Lower extremity skin temperature gradient within normal limits. No edema noted b/l lower extremities.  No cyanosis or clubbing noted.  Neurologic Normal speech. Oriented to person, place, and time. Epicritic sensation to light touch grossly present bilaterally. Protective sensation intact 5/5 intact bilaterally with 10g monofilament b/l. Vibratory sensation intact b/l. Clonus negative b/l.  Dermatologic Pedal skin with normal turgor, texture and tone bilaterally. No open wounds bilaterally. No interdigital macerations bilaterally. Toenails 1-5 b/l elongated, discolored, dystrophic, thickened, crumbly with subungual debris and tenderness to dorsal palpation.  Orthopedic: Normal muscle strength 5/5 to all lower extremity muscle groups bilaterally. No pain crepitus or joint limitation noted with ROM b/l. No gross bony deformities bilaterally.   Radiographs: None Assessment:   1. Pain due to onychomycosis of toenail    Plan:  Patient was evaluated and treated and all questions answered.  Onychomycosis with pain -Nails palliatively debridement as below -Educated on self-care  Procedure: Nail Debridement Rationale: Pain Type of Debridement: manual, sharp debridement. Instrumentation: Nail nipper, rotary burr. Number of Nails: 10 -Examined patient. -No new findings. No new orders. -Toenails 1-5 b/l were debrided in  length and girth with sterile nail nippers and dremel without iatrogenic bleeding.  -We did discuss treatments for toenail fungus and efficacy of treatment.  -Patient to report any pedal injuries to medical professional immediately. -Patient to continue soft, supportive shoe gear daily. -Patient/POA to call should there be question/concern in the interim.  Return in about 3 months (around 10/24/2020).  Marzetta Board, DPM

## 2020-07-27 ENCOUNTER — Other Ambulatory Visit: Payer: Self-pay | Admitting: Internal Medicine

## 2020-07-28 DIAGNOSIS — H31091 Other chorioretinal scars, right eye: Secondary | ICD-10-CM | POA: Diagnosis not present

## 2020-07-28 DIAGNOSIS — H353222 Exudative age-related macular degeneration, left eye, with inactive choroidal neovascularization: Secondary | ICD-10-CM | POA: Diagnosis not present

## 2020-07-28 DIAGNOSIS — H353211 Exudative age-related macular degeneration, right eye, with active choroidal neovascularization: Secondary | ICD-10-CM | POA: Diagnosis not present

## 2020-07-28 DIAGNOSIS — H35373 Puckering of macula, bilateral: Secondary | ICD-10-CM | POA: Diagnosis not present

## 2020-08-03 ENCOUNTER — Other Ambulatory Visit: Payer: Self-pay

## 2020-08-03 ENCOUNTER — Encounter: Payer: Self-pay | Admitting: Internal Medicine

## 2020-08-03 ENCOUNTER — Ambulatory Visit (INDEPENDENT_AMBULATORY_CARE_PROVIDER_SITE_OTHER): Payer: Medicare Other | Admitting: Internal Medicine

## 2020-08-03 VITALS — BP 122/68 | HR 55 | Temp 97.7°F | Ht 69.0 in | Wt 151.0 lb

## 2020-08-03 DIAGNOSIS — Z23 Encounter for immunization: Secondary | ICD-10-CM

## 2020-08-03 DIAGNOSIS — M810 Age-related osteoporosis without current pathological fracture: Secondary | ICD-10-CM | POA: Diagnosis not present

## 2020-08-03 DIAGNOSIS — G301 Alzheimer's disease with late onset: Secondary | ICD-10-CM | POA: Diagnosis not present

## 2020-08-03 DIAGNOSIS — E039 Hypothyroidism, unspecified: Secondary | ICD-10-CM | POA: Diagnosis not present

## 2020-08-03 DIAGNOSIS — F0281 Dementia in other diseases classified elsewhere with behavioral disturbance: Secondary | ICD-10-CM

## 2020-08-03 DIAGNOSIS — R296 Repeated falls: Secondary | ICD-10-CM | POA: Diagnosis not present

## 2020-08-03 DIAGNOSIS — H353211 Exudative age-related macular degeneration, right eye, with active choroidal neovascularization: Secondary | ICD-10-CM | POA: Diagnosis not present

## 2020-08-03 DIAGNOSIS — F3341 Major depressive disorder, recurrent, in partial remission: Secondary | ICD-10-CM | POA: Diagnosis not present

## 2020-08-03 DIAGNOSIS — H353213 Exudative age-related macular degeneration, right eye, with inactive scar: Secondary | ICD-10-CM

## 2020-08-03 DIAGNOSIS — I872 Venous insufficiency (chronic) (peripheral): Secondary | ICD-10-CM | POA: Diagnosis not present

## 2020-08-03 DIAGNOSIS — I10 Essential (primary) hypertension: Secondary | ICD-10-CM | POA: Diagnosis not present

## 2020-08-03 DIAGNOSIS — I272 Pulmonary hypertension, unspecified: Secondary | ICD-10-CM | POA: Diagnosis not present

## 2020-08-03 DIAGNOSIS — G250 Essential tremor: Secondary | ICD-10-CM | POA: Diagnosis not present

## 2020-08-03 DIAGNOSIS — F02818 Dementia in other diseases classified elsewhere, unspecified severity, with other behavioral disturbance: Secondary | ICD-10-CM

## 2020-08-03 MED ORDER — POTASSIUM CHLORIDE CRYS ER 20 MEQ PO TBCR: 20.0000 meq | EXTENDED_RELEASE_TABLET | Freq: Every day | ORAL | 1 refills | Status: DC

## 2020-08-03 MED ORDER — PROPRANOLOL HCL ER 60 MG PO CP24: 60.0000 mg | ORAL_CAPSULE | Freq: Every day | ORAL | 1 refills | Status: DC

## 2020-08-03 MED ORDER — FLUOXETINE HCL 20 MG PO TABS: 20.0000 mg | ORAL_TABLET | Freq: Every day | ORAL | 1 refills | Status: DC

## 2020-08-03 NOTE — Patient Instructions (Addendum)
Please put debrox drops into your left ear nightly (5 drops in left ear) for 5-7 nights. Then schedule a nurse visit for ears to be flushed with warm water and peroxide here.

## 2020-08-03 NOTE — Progress Notes (Signed)
Location:  [facility] clinic Provider:  Freada Twersky L. Mariea Clonts, D.O., C.M.D.  Code Status: DNR Goals of Care:  Advanced Directives 08/03/2020  Does Patient Have a Medical Advance Directive? Yes  Type of Advance Directive Out of facility DNR (pink MOST or yellow form)  Does patient want to make changes to medical advance directive? No - Patient declined  Copy of [place] in Chart? -  Pre-existing out of facility DNR order (yellow form or pink MOST form) -     Chief Complaint  Patient presents with  . Medical Management of Chronic Issues    6 month follow up   . Acute Visit    unsteady gait, progression of the alzheimer's disease , patient want s     HPI: Patient is a 86 y.o. female seen today for medical management of chronic diseases.  She's here with her son.    She had a fall coming back from the bathroom (or to).  She fell on her right arm and had a purple-black bruise.  Cannot lift the right arm now w/o using the other arm.  No pain now. Has fallen about 5 times since April.    Sometimes up early like 8:15-8:20 but later usually at 55.  Sleeping more.    Memory is worsening--she's not recalling her falls and her son and daughter in law are having to take photos to document that something happened b/c she won't believe it afterwards. Her dog, Max, could not move and had to be put down.  She has no recollection of him now.    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  She's only worn her compression hose three times a week when CNA can put them on.    Her getting up overnight has subsided.  She's going to bed around 11pm.  No wandering concern lately.  She's been unlocking some doors overnight sometimes.  There is one secure one.    She has wet macular in her right eye.  They went to Dr. Jule Ser at the retina specialty office after optometrist was not sure what was happening.  There were a lot of pics taken the first retina visit, next visit looked ok and right eye started to  have wet macular--had a round of numbing and injections.  Left eye looked like she'd had laser txs before.  Gets her toenails trimmed with podiatry.    She's starting to have some hearing loss.  TV is really loud at night.    Past Medical History:  Diagnosis Date  . Benign essential tremor 07/18/2018  . Bilateral nonexudative age-related macular degeneration 07/18/2018  . Cholestatic hepatitis    2001  . DDD (degenerative disc disease), lumbar 04/23/2018  . Depression   . Dry eyes, bilateral 07/18/2018  . Fatigue   . GERD (gastroesophageal reflux disease)   . Hiatal hernia with GERD 07/29/2011  . Hoarding behavior 04/23/2018  . HTN (hypertension) 07/29/2011  . Hyperlipidemia   . Hypertension   . Hypothyroidism 07/29/2011  . Insomnia   . Late onset Alzheimer's disease with behavioral disturbance ([place]) 04/23/2018  . LBBB (left bundle branch block) 07/29/2011  . Major depressive disorder 07/29/2016  . Memory impairment 12/09/2016  . Memory loss   . Osteoporosis   . Paranoid delusion ([place]) 07/18/2018  . Psychophysiological insomnia 04/23/2018  . Pulmonary HTN ([place]) 03/15/2019   2 d echo 02/28/19 EF 55-60 %, mild to moderate mitral valve regurg, mild tricuspid regurg, severely elevated estimated right  ventricular systolic pressure 85 mm Hg, and mild to moderate aortic valve regurg. No diastolic CHF noted.   . Pure hypercholesterolemia 07/29/2011  . Sciatica   . Senile osteoporosis 04/23/2018    Past Surgical History:  Procedure Laterality Date  . APPENDECTOMY    . CARDIAC CATHETERIZATION    . FACIAL COSMETIC SURGERY    . TONSILLECTOMY    . TOTAL ABDOMINAL HYSTERECTOMY W/ BILATERAL SALPINGOOPHORECTOMY      Allergies  Allergen Reactions  . Garlic   . Lac Bovis   . Lanolin   . Penicillins     ? rash  . Statins     myalgia    Outpatient Encounter Medications as of 08/03/2020  Medication Sig  . Calcium Carbonate-Vitamin D 600-400 MG-UNIT chew tablet Chew 1 tablet by mouth 3 (three)  times daily with meals.  . Cholecalciferol (D3 HIGH POTENCY) 2000 units CAPS Take 1 capsule (2,000 Units total) by mouth daily.  Marland Kitchen donepezil (ARICEPT) 10 MG tablet TAKE 1 TABLET(10 MG) BY MOUTH AT BEDTIME  . FLUoxetine (PROZAC) 20 MG tablet Take 1 tablet (20 mg total) by mouth daily.  . furosemide (LASIX) 40 MG tablet TAKE 1 TABLET(40 MG) BY MOUTH DAILY  . irbesartan (AVAPRO) 300 MG tablet TAKE 1 TABLET(300 MG) BY MOUTH DAILY  . levothyroxine (SYNTHROID) 25 MCG tablet Take 1 tablet (25 mcg total) by mouth at bedtime.  . Melatonin 10 MG TABS Take 1 tablet by mouth at bedtime.  . Multiple Vitamins-Minerals (PRESERVISION AREDS 2) CAPS Take 1 capsule by mouth 2 (two) times daily.  Marland Kitchen omeprazole (PRILOSEC) 20 MG capsule TAKE 1 CAPSULE(20 MG) BY MOUTH DAILY  . potassium chloride SA (KLOR-CON) 20 MEQ tablet Take 1 tablet (20 mEq total) by mouth daily.  . propranolol ER (INDERAL LA) 60 MG 24 hr capsule Take 1 capsule (60 mg total) by mouth daily.  . traZODone (DESYREL) 50 MG tablet TAKE 1 TABLET(50 MG) BY MOUTH AT BEDTIME  . triamcinolone cream (KENALOG) 0.1 % Apply 1 application topically as needed.  . vitamin C (ASCORBIC ACID) 500 MG tablet Take 1 tablet (500 mg total) by mouth daily.   No facility-administered encounter medications on file as of 08/03/2020.    Review of Systems:  Review of Systems  Constitutional: Negative for chills, fever and malaise/fatigue.  HENT: Positive for hearing loss.   Eyes: Positive for blurred vision.       Reports seeing well, but clearly is not  Respiratory: Negative for cough and shortness of breath.   Cardiovascular: Negative for chest pain and leg swelling.       Using compression hose three days a week  Gastrointestinal: Negative for abdominal pain, blood in stool, constipation, diarrhea and melena.  Genitourinary: Positive for frequency. Negative for dysuria and urgency.  Musculoskeletal: Positive for falls. Negative for back pain, joint pain, myalgias and  neck pain.  Neurological: Positive for tremors and weakness. Negative for dizziness and loss of consciousness.       More unsteady lately  Endo/Heme/Allergies: Bruises/bleeds easily.  Psychiatric/Behavioral: Positive for memory loss. Negative for depression. The patient is not nervous/anxious and does not have insomnia.     Health Maintenance  Topic Date Due  . INFLUENZA VACCINE  05/17/2020  . TETANUS/TDAP  05/04/2028  . DEXA SCAN  Completed  . COVID-19 Vaccine  Completed  . PNA vac Low Risk Adult  Completed    Physical Exam: Vitals:   08/03/20 1539  Weight: 151 lb (68.5 kg)  Height: 5'  9" (1.753 m)   Body mass index is 22.3 kg/m. Physical Exam Vitals reviewed.  Constitutional:      General: She is not in acute distress.    Appearance: She is not toxic-appearing.  HENT:     Head: Normocephalic and atraumatic.     Right Ear: Tympanic membrane, ear canal and external ear normal.     Left Ear: External ear normal.     Ears:     Comments: Moist cerumen in left ear, right clear Eyes:     Comments: Just had eyedrops and eyes were watering   Cardiovascular:     Rate and Rhythm: Rhythm irregular.     Heart sounds: Murmur heard.   Pulmonary:     Effort: Pulmonary effort is normal.     Breath sounds: Normal breath sounds. No wheezing, rhonchi or rales.  Abdominal:     General: Bowel sounds are normal. There is no distension.     Palpations: Abdomen is soft.     Tenderness: There is no abdominal tenderness. There is no guarding or rebound.  Musculoskeletal:     Cervical back: Neck supple.     Right lower leg: No edema.     Left lower leg: No edema.     Comments: No edema with compression hose  Lymphadenopathy:     Cervical: No cervical adenopathy.  Skin:    General: Skin is warm and dry.  Neurological:     General: No focal deficit present.     Mental Status: She is alert.     Motor: Weakness present.     Gait: Gait abnormal.     Comments: Oscillating tremor, rapid,  violent at times  Psychiatric:        Mood and Affect: Mood normal.     Labs reviewed: Basic Metabolic Panel: No results for input(s): NA, K, CL, CO2, GLUCOSE, BUN, CREATININE, CALCIUM, MG, PHOS, TSH in the last 8760 hours. Liver Function Tests: No results for input(s): AST, ALT, ALKPHOS, BILITOT, PROT, ALBUMIN in the last 8760 hours. No results for input(s): LIPASE, AMYLASE in the last 8760 hours. No results for input(s): AMMONIA in the last 8760 hours. CBC: No results for input(s): WBC, NEUTROABS, HGB, HCT, MCV, PLT in the last 8760 hours. Lipid Panel: No results for input(s): CHOL, HDL, LDLCALC, TRIG, CHOLHDL, LDLDIRECT in the last 8760 hours. No results found for: HGBA1C  Procedures since last visit: No results found.  Assessment/Plan 1. Frequent falls - multifactorial with vision, hearing, tremor, and progressing dementia all playing roles - Ambulatory referral to [facility] and home safety eval, nursing education   2. Benign essential tremor - cont propranolol  - Ambulatory referral to [facility] - CBC with Differential/Platelet - propranolol ER (INDERAL LA) 60 MG 24 hr capsule; Take 1 capsule (60 mg total) by mouth daily.  Dispense: 90 capsule; Refill: 1  3. Late onset Alzheimer's disease with behavioral disturbance ([place]) - cont aricept and home support from son, DIL and caregiver  - CBC with Differential/Platelet  4. Chronic venous insufficiency -ok to use compression hose only some days due to improved edema while DIL is postop and unable to apply them - CBC with Differential/Platelet - COMPLETE METABOLIC PANEL WITH GFR - potassium chloride SA (KLOR-CON) 20 MEQ tablet; Take 1 tablet (20 mEq total) by mouth daily.  Dispense: 90 tablet; Refill: 1  5. Essential hypertension -bp at goal, cont same regimen, monitor for orthostasis - CBC with Differential/Platelet - COMPLETE METABOLIC PANEL WITH  GFR  6. Exudative age-related macular degeneration of right eye  with inactive scar ([place]) -getting injections now thru ophtho--cont to monitor  7. Need for influenza vaccination -given today  8. Hypothyroidism, unspecified type - due for labs, if tsh normal, need to send in levothyroxine same dose; if abnormal, will adjust - TSH - CBC with Differential/Platelet  9. Pulmonary HTN ([place]) - f/u labs -appears euvolemic and stable  - CBC with Differential/Platelet - COMPLETE METABOLIC PANEL WITH GFR  10. Senile osteoporosis -remains on vitamins for this, does ambulate at home and does not use assistive device, has been falling  11. Recurrent major depressive disorder, in partial remission ([place]) -renewed medication, seems stable - FLUoxetine (PROZAC) 20 MG tablet; Take 1 tablet (20 mg total) by mouth daily.  Dispense: 90 tablet; Refill: 1   Labs/tests ordered:  * No order type specified * Next appt:  Visit date not found   Hartley Urton L. Travian Kerner, D.O. [facility] [address], [place] [address] Cell Phone (Mon-Fri 8am-5pm):  [phone] On Call:  [phone] & follow prompts after 5pm & weekends Office Phone:  [phone] Office Fax:  [phone]

## 2020-08-04 LAB — COMPLETE METABOLIC PANEL WITH GFR
AG Ratio: 1.5 (calc) (ref 1.0–2.5)
ALT: 22 U/L (ref 6–29)
AST: 21 U/L (ref 10–35)
Albumin: 3.7 g/dL (ref 3.6–5.1)
Alkaline phosphatase (APISO): 153 U/L (ref 37–153)
BUN/Creatinine Ratio: 14 (calc) (ref 6–22)
BUN: 19 mg/dL (ref 7–25)
CO2: 27 mmol/L (ref 20–32)
Calcium: 9.1 mg/dL (ref 8.6–10.4)
Chloride: 100 mmol/L (ref 98–110)
Creat: 1.36 mg/dL — ABNORMAL HIGH (ref 0.60–0.88)
GFR, Est African American: 39 mL/min/{1.73_m2} — ABNORMAL LOW (ref >=60)
GFR, Est Non African American: 33 mL/min/{1.73_m2} — ABNORMAL LOW (ref >=60)
Globulin: 2.5 g/dL (calc) (ref 1.9–3.7)
Glucose, Bld: 139 mg/dL (ref 65–139)
Potassium: 4 mmol/L (ref 3.5–5.3)
Sodium: 137 mmol/L (ref 135–146)
Total Bilirubin: 0.4 mg/dL (ref 0.2–1.2)
Total Protein: 6.2 g/dL (ref 6.1–8.1)

## 2020-08-04 LAB — CBC WITH DIFFERENTIAL/PLATELET
Absolute Monocytes: 641 cells/uL (ref 200–950)
Basophils Absolute: 29 cells/uL (ref 0–200)
Basophils Relative: 0.4 %
Eosinophils Absolute: 173 cells/uL (ref 15–500)
Eosinophils Relative: 2.4 %
HCT: 37.1 % (ref 35.0–45.0)
Hemoglobin: 12.5 g/dL (ref 11.7–15.5)
Lymphs Abs: 1030 cells/uL (ref 850–3900)
MCH: 31.7 pg (ref 27.0–33.0)
MCHC: 33.7 g/dL (ref 32.0–36.0)
MCV: 94.2 fL (ref 80.0–100.0)
MPV: 10.2 fL (ref 7.5–12.5)
Monocytes Relative: 8.9 %
Neutro Abs: 5328 cells/uL (ref 1500–7800)
Neutrophils Relative %: 74 %
Platelets: 174 10*3/uL (ref 140–400)
RBC: 3.94 10*6/uL (ref 3.80–5.10)
RDW: 12.1 % (ref 11.0–15.0)
Total Lymphocyte: 14.3 %
WBC: 7.2 10*3/uL (ref 3.8–10.8)

## 2020-08-04 LAB — TSH: TSH: 2.26 mIU/L (ref 0.40–4.50)

## 2020-08-04 NOTE — Progress Notes (Signed)
Blood counts are normal (no longer anemic) and thyroid is within range. Kidney function has declined considerably.  I wonder if she's drinking enough fluids--I know she had no edema whatsoever at her appt.

## 2020-08-05 ENCOUNTER — Other Ambulatory Visit: Payer: Self-pay | Admitting: Internal Medicine

## 2020-08-11 ENCOUNTER — Other Ambulatory Visit: Payer: Self-pay

## 2020-08-11 ENCOUNTER — Encounter: Payer: Self-pay | Admitting: Family

## 2020-08-11 ENCOUNTER — Ambulatory Visit (INDEPENDENT_AMBULATORY_CARE_PROVIDER_SITE_OTHER): Payer: Medicare Other | Admitting: Family

## 2020-08-11 VITALS — BP 134/70 | HR 51 | Temp 97.7°F | Resp 16 | Ht 69.0 in | Wt 149.2 lb

## 2020-08-11 DIAGNOSIS — H6122 Impacted cerumen, left ear: Secondary | ICD-10-CM | POA: Diagnosis not present

## 2020-08-11 DIAGNOSIS — I272 Pulmonary hypertension, unspecified: Secondary | ICD-10-CM | POA: Diagnosis not present

## 2020-08-11 DIAGNOSIS — E039 Hypothyroidism, unspecified: Secondary | ICD-10-CM | POA: Diagnosis not present

## 2020-08-11 DIAGNOSIS — K449 Diaphragmatic hernia without obstruction or gangrene: Secondary | ICD-10-CM | POA: Diagnosis not present

## 2020-08-11 DIAGNOSIS — K219 Gastro-esophageal reflux disease without esophagitis: Secondary | ICD-10-CM | POA: Diagnosis not present

## 2020-08-11 DIAGNOSIS — I872 Venous insufficiency (chronic) (peripheral): Secondary | ICD-10-CM | POA: Diagnosis not present

## 2020-08-11 DIAGNOSIS — G301 Alzheimer's disease with late onset: Secondary | ICD-10-CM | POA: Diagnosis not present

## 2020-08-11 DIAGNOSIS — M810 Age-related osteoporosis without current pathological fracture: Secondary | ICD-10-CM | POA: Diagnosis not present

## 2020-08-11 DIAGNOSIS — I447 Left bundle-branch block, unspecified: Secondary | ICD-10-CM | POA: Diagnosis not present

## 2020-08-11 DIAGNOSIS — G250 Essential tremor: Secondary | ICD-10-CM | POA: Diagnosis not present

## 2020-08-11 DIAGNOSIS — Z9181 History of falling: Secondary | ICD-10-CM | POA: Diagnosis not present

## 2020-08-11 DIAGNOSIS — F3341 Major depressive disorder, recurrent, in partial remission: Secondary | ICD-10-CM | POA: Diagnosis not present

## 2020-08-11 DIAGNOSIS — I10 Essential (primary) hypertension: Secondary | ICD-10-CM | POA: Diagnosis not present

## 2020-08-11 DIAGNOSIS — F0281 Dementia in other diseases classified elsewhere with behavioral disturbance: Secondary | ICD-10-CM | POA: Diagnosis not present

## 2020-08-11 DIAGNOSIS — E7800 Pure hypercholesterolemia, unspecified: Secondary | ICD-10-CM | POA: Diagnosis not present

## 2020-08-11 DIAGNOSIS — M5430 Sciatica, unspecified side: Secondary | ICD-10-CM | POA: Diagnosis not present

## 2020-08-11 DIAGNOSIS — M5136 Other intervertebral disc degeneration, lumbar region: Secondary | ICD-10-CM

## 2020-08-11 DIAGNOSIS — G4700 Insomnia, unspecified: Secondary | ICD-10-CM | POA: Diagnosis not present

## 2020-08-11 DIAGNOSIS — H353213 Exudative age-related macular degeneration, right eye, with inactive scar: Secondary | ICD-10-CM

## 2020-08-11 NOTE — Progress Notes (Signed)
Provider: Camilla Skeen FNP-C  Gayland Curry, DO  Patient Care Team: Gayland Curry, DO as PCP - General (Geriatric Medicine) Jerline Pain, MD as PCP - Cardiology (Cardiology)  Extended Emergency Contact Information Primary Emergency Contact: Su Monks Address: [address]          [place], [place] [address] Johnnette Litter of [place] Work Phone: [phone] Mobile Phone: [phone] Relation: Son Secondary Emergency Contact: Flonnie Overman Address: [address]          [place], [place] [address] Johnnette Litter of [place] Mobile Phone: [phone] Relation: Relative  Code Status:  DNR Goals of care: Advanced Directive information Advanced Directives 08/11/2020  Does Patient Have a Medical Advance Directive? Yes  Type of Advance Directive Out of facility DNR (pink MOST or yellow form);Healthcare Power of Attorney  Does patient want to make changes to medical advance directive? No - Patient declined  Copy of [place] in Chart? Yes - validated most recent copy scanned in chart (See row information)  Pre-existing out of facility DNR order (yellow form or pink MOST form) -     Chief Complaint  Patient presents with  . Acute Visit    Left Ear Lavage     HPI:  Pt is a 86 y.o. female seen today for an acute visit for evaluation of left ear Lavage Has used debrox for several days.states ear just feel full.she denies any fever,chills,pain or ringing in the ear.   Past Medical History:  Diagnosis Date  . Benign essential tremor 07/18/2018  . Bilateral nonexudative age-related macular degeneration 07/18/2018  . Cholestatic hepatitis    2001  . DDD (degenerative disc disease), lumbar 04/23/2018  . Depression   . Dry eyes, bilateral 07/18/2018  . Fatigue   . GERD (gastroesophageal reflux disease)   . Hiatal hernia with GERD 07/29/2011  . Hoarding behavior 04/23/2018  . HTN (hypertension) 07/29/2011  . Hyperlipidemia   . Hypertension   . Hypothyroidism  07/29/2011  . Insomnia   . Late onset Alzheimer's disease with behavioral disturbance ([place]) 04/23/2018  . LBBB (left bundle branch block) 07/29/2011  . Major depressive disorder 07/29/2016  . Memory impairment 12/09/2016  . Memory loss   . Osteoporosis   . Paranoid delusion ([place]) 07/18/2018  . Psychophysiological insomnia 04/23/2018  . Pulmonary HTN ([place]) 03/15/2019   2 d echo 02/28/19 EF 55-60 %, mild to moderate mitral valve regurg, mild tricuspid regurg, severely elevated estimated right ventricular systolic pressure 85 mm Hg, and mild to moderate aortic valve regurg. No diastolic CHF noted.   . Pure hypercholesterolemia 07/29/2011  . Sciatica   . Senile osteoporosis 04/23/2018   Past Surgical History:  Procedure Laterality Date  . APPENDECTOMY    . CARDIAC CATHETERIZATION    . FACIAL COSMETIC SURGERY    . TONSILLECTOMY    . TOTAL ABDOMINAL HYSTERECTOMY W/ BILATERAL SALPINGOOPHORECTOMY      Allergies  Allergen Reactions  . Garlic   . Lac Bovis   . Lanolin   . Penicillins     ? rash  . Statins     myalgia    Outpatient Encounter Medications as of 08/11/2020  Medication Sig  . Calcium Carbonate-Vitamin D 600-400 MG-UNIT chew tablet Chew 1 tablet by mouth 3 (three) times daily with meals.  . Cholecalciferol (D3 HIGH POTENCY) 2000 units CAPS Take 1 capsule (2,000 Units total) by mouth daily.  Marland Kitchen donepezil (ARICEPT) 10 MG tablet TAKE 1 TABLET(10 MG) BY MOUTH AT BEDTIME  .  FLUoxetine (PROZAC) 20 MG tablet Take 1 tablet (20 mg total) by mouth daily.  . furosemide (LASIX) 40 MG tablet TAKE 1 TABLET(40 MG) BY MOUTH DAILY  . irbesartan (AVAPRO) 300 MG tablet TAKE 1 TABLET(300 MG) BY MOUTH DAILY  . levothyroxine (SYNTHROID) 25 MCG tablet TAKE 1 TABLET(25 MCG) BY MOUTH AT BEDTIME  . Melatonin 10 MG TABS Take 1 tablet by mouth at bedtime.  . Multiple Vitamins-Minerals (PRESERVISION AREDS 2) CAPS Take 1 capsule by mouth 2 (two) times daily.  Marland Kitchen omeprazole (PRILOSEC) 20 MG capsule TAKE 1  CAPSULE(20 MG) BY MOUTH DAILY  . potassium chloride SA (KLOR-CON) 20 MEQ tablet Take 1 tablet (20 mEq total) by mouth daily.  . propranolol ER (INDERAL LA) 60 MG 24 hr capsule Take 1 capsule (60 mg total) by mouth daily.  . traZODone (DESYREL) 50 MG tablet TAKE 1 TABLET(50 MG) BY MOUTH AT BEDTIME  . vitamin C (ASCORBIC ACID) 500 MG tablet Take 1 tablet (500 mg total) by mouth daily.   No facility-administered encounter medications on file as of 08/11/2020.    Review of Systems  Constitutional: Negative for appetite change, chills, fatigue and fever.  HENT: Negative for congestion, ear discharge, ear pain, rhinorrhea, sinus pressure, sinus pain, sneezing, sore throat and tinnitus.   Eyes: Negative for discharge, redness and itching.  Respiratory: Negative for cough, chest tightness, shortness of breath and wheezing.   Cardiovascular: Negative for chest pain, palpitations and leg swelling.  Skin: Negative for color change, pallor and rash.  Neurological: Negative for dizziness, light-headedness and headaches.  Psychiatric/Behavioral: Negative for agitation.    Immunization History  Administered Date(s) Administered  . Influenza Split 08/14/2009, 06/29/2010, 07/19/2011, 07/29/2016  . Influenza, High Dose Seasonal PF 08/08/2012, 07/26/2013, 09/17/2014, 07/31/2015, 08/15/2019  . Influenza, Quadrivalent, Recombinant, Inj, Pf 07/19/2017  . Influenza,inj,Quad PF,6+ Mos 08/16/2018  . Influenza,trivalent, recombinat, inj, PF 07/26/2016  . Moderna SARS-COVID-2 Vaccination 10/22/2019, 11/19/2019  . Pneumococcal Conjugate-13 08/21/2009  . Pneumococcal Polysaccharide-23 04/20/2018  . Td 10/13/1997  . Tdap 08/31/2006  . Zoster 06/15/2011  . Zoster Recombinat (Shingrix) 05/25/2018, 08/19/2018   Pertinent  Health Maintenance Due  Topic Date Due  . INFLUENZA VACCINE  05/17/2020  . DEXA SCAN  Completed  . PNA vac Low Risk Adult  Completed   Fall Risk  08/11/2020 08/03/2020 01/27/2020 03/20/2019  10/24/2018  Falls in the past year? 1 1 1  0 0  Number falls in past yr: 1 1 0 0 0  Comment - 5 falls since april 1 last week - - -  Injury with Fall? 1 1 0 0 0  Comment - bruising on the arm - - -   Functional Status Survey:    Vitals:   08/11/20 1518  BP: 134/70  Pulse: (!) 51  Resp: 16  Temp: 97.7 F (36.5 C)  SpO2: 93%  Weight: 149 lb 3.2 oz (67.7 kg)  Height: 5\' 9"  (1.753 m)   Body mass index is 22.03 kg/m. Physical Exam Vitals reviewed.  Constitutional:      General: She is not in acute distress.    Appearance: She is not ill-appearing.  HENT:     Head: Normocephalic.     Right Ear: Tympanic membrane, ear canal and external ear normal. There is no impacted cerumen.     Left Ear: There is impacted cerumen.     Ears:     Comments: Ear lavaged with warm water and hydrogen peroxide tolerated procedure well no instrument used.No signs of infection  noted.      Nose: Nose normal. No congestion or rhinorrhea.     Mouth/Throat:     Mouth: Mucous membranes are moist.     Pharynx: Oropharynx is clear. No oropharyngeal exudate or posterior oropharyngeal erythema.  Eyes:     General: No scleral icterus.       Right eye: No discharge.        Left eye: No discharge.     Conjunctiva/sclera: Conjunctivae normal.     Pupils: Pupils are equal, round, and reactive to light.  Cardiovascular:     Rate and Rhythm: Normal rate and regular rhythm.     Pulses: Normal pulses.     Heart sounds: Normal heart sounds. No murmur heard.  No friction rub. No gallop.   Pulmonary:     Effort: Pulmonary effort is normal. No respiratory distress.     Breath sounds: Normal breath sounds. No wheezing, rhonchi or rales.  Chest:     Chest wall: No tenderness.  Skin:    General: Skin is warm and dry.     Coloration: Skin is not pale.     Findings: No erythema.  Neurological:     Mental Status: She is alert. Mental status is at baseline.     Cranial Nerves: No cranial nerve deficit.     Motor: No  weakness.     Gait: Gait abnormal.  Psychiatric:        Mood and Affect: Mood normal.        Behavior: Behavior normal.        Thought Content: Thought content normal.        Judgment: Judgment normal.    Labs reviewed: Recent Labs    08/03/20 1623  NA 137  K 4.0  CL 100  CO2 27  GLUCOSE 139  BUN 19  CREATININE 1.36*  CALCIUM 9.1   Recent Labs    08/03/20 1623  AST 21  ALT 22  BILITOT 0.4  PROT 6.2   Recent Labs    08/03/20 1623  WBC 7.2  NEUTROABS 5,328  HGB 12.5  HCT 37.1  MCV 94.2  PLT 174   Lab Results  Component Value Date   TSH 2.26 08/03/2020   No results found for: HGBA1C Lab Results  Component Value Date   CHOL 244 (A) 04/19/2018   HDL 54 04/19/2018   LDLCALC 168 04/19/2018   TRIG 110 04/19/2018    Significant Diagnostic Results in last 30 days:  No results found.  Assessment/Plan   Left ear impacted cerumen Afebrile.Ear lavaged with warm water and hydrogen peroxide tolerated procedure well no instrument used.No signs of infection noted. - Ear Lavage  Family/ staff Communication: Reviewed plan of care with patient and POA   Labs/tests ordered: None   Next Appointment: As needed if symptoms worse or fail to improve.   Sandrea Hughs, NP

## 2020-08-14 DIAGNOSIS — M810 Age-related osteoporosis without current pathological fracture: Secondary | ICD-10-CM | POA: Diagnosis not present

## 2020-08-14 DIAGNOSIS — F0281 Dementia in other diseases classified elsewhere with behavioral disturbance: Secondary | ICD-10-CM | POA: Diagnosis not present

## 2020-08-14 DIAGNOSIS — I872 Venous insufficiency (chronic) (peripheral): Secondary | ICD-10-CM | POA: Diagnosis not present

## 2020-08-14 DIAGNOSIS — I10 Essential (primary) hypertension: Secondary | ICD-10-CM | POA: Diagnosis not present

## 2020-08-14 DIAGNOSIS — I272 Pulmonary hypertension, unspecified: Secondary | ICD-10-CM | POA: Diagnosis not present

## 2020-08-14 DIAGNOSIS — G301 Alzheimer's disease with late onset: Secondary | ICD-10-CM | POA: Diagnosis not present

## 2020-08-18 DIAGNOSIS — I872 Venous insufficiency (chronic) (peripheral): Secondary | ICD-10-CM | POA: Diagnosis not present

## 2020-08-18 DIAGNOSIS — I10 Essential (primary) hypertension: Secondary | ICD-10-CM | POA: Diagnosis not present

## 2020-08-18 DIAGNOSIS — F0281 Dementia in other diseases classified elsewhere with behavioral disturbance: Secondary | ICD-10-CM | POA: Diagnosis not present

## 2020-08-18 DIAGNOSIS — I272 Pulmonary hypertension, unspecified: Secondary | ICD-10-CM | POA: Diagnosis not present

## 2020-08-18 DIAGNOSIS — M810 Age-related osteoporosis without current pathological fracture: Secondary | ICD-10-CM | POA: Diagnosis not present

## 2020-08-18 DIAGNOSIS — G301 Alzheimer's disease with late onset: Secondary | ICD-10-CM | POA: Diagnosis not present

## 2020-08-21 DIAGNOSIS — G301 Alzheimer's disease with late onset: Secondary | ICD-10-CM | POA: Diagnosis not present

## 2020-08-21 DIAGNOSIS — I872 Venous insufficiency (chronic) (peripheral): Secondary | ICD-10-CM | POA: Diagnosis not present

## 2020-08-21 DIAGNOSIS — M810 Age-related osteoporosis without current pathological fracture: Secondary | ICD-10-CM | POA: Diagnosis not present

## 2020-08-21 DIAGNOSIS — F0281 Dementia in other diseases classified elsewhere with behavioral disturbance: Secondary | ICD-10-CM | POA: Diagnosis not present

## 2020-08-21 DIAGNOSIS — I272 Pulmonary hypertension, unspecified: Secondary | ICD-10-CM | POA: Diagnosis not present

## 2020-08-21 DIAGNOSIS — I10 Essential (primary) hypertension: Secondary | ICD-10-CM | POA: Diagnosis not present

## 2020-08-25 DIAGNOSIS — I10 Essential (primary) hypertension: Secondary | ICD-10-CM | POA: Diagnosis not present

## 2020-08-25 DIAGNOSIS — I272 Pulmonary hypertension, unspecified: Secondary | ICD-10-CM | POA: Diagnosis not present

## 2020-08-25 DIAGNOSIS — I872 Venous insufficiency (chronic) (peripheral): Secondary | ICD-10-CM | POA: Diagnosis not present

## 2020-08-25 DIAGNOSIS — G301 Alzheimer's disease with late onset: Secondary | ICD-10-CM | POA: Diagnosis not present

## 2020-08-25 DIAGNOSIS — M810 Age-related osteoporosis without current pathological fracture: Secondary | ICD-10-CM | POA: Diagnosis not present

## 2020-08-25 DIAGNOSIS — F0281 Dementia in other diseases classified elsewhere with behavioral disturbance: Secondary | ICD-10-CM | POA: Diagnosis not present

## 2020-08-28 DIAGNOSIS — I272 Pulmonary hypertension, unspecified: Secondary | ICD-10-CM | POA: Diagnosis not present

## 2020-08-28 DIAGNOSIS — F0281 Dementia in other diseases classified elsewhere with behavioral disturbance: Secondary | ICD-10-CM | POA: Diagnosis not present

## 2020-08-28 DIAGNOSIS — M810 Age-related osteoporosis without current pathological fracture: Secondary | ICD-10-CM | POA: Diagnosis not present

## 2020-08-28 DIAGNOSIS — I10 Essential (primary) hypertension: Secondary | ICD-10-CM | POA: Diagnosis not present

## 2020-08-28 DIAGNOSIS — I872 Venous insufficiency (chronic) (peripheral): Secondary | ICD-10-CM | POA: Diagnosis not present

## 2020-08-28 DIAGNOSIS — G301 Alzheimer's disease with late onset: Secondary | ICD-10-CM | POA: Diagnosis not present

## 2020-08-31 DIAGNOSIS — H353222 Exudative age-related macular degeneration, left eye, with inactive choroidal neovascularization: Secondary | ICD-10-CM | POA: Diagnosis not present

## 2020-08-31 DIAGNOSIS — H35373 Puckering of macula, bilateral: Secondary | ICD-10-CM | POA: Diagnosis not present

## 2020-08-31 DIAGNOSIS — H35033 Hypertensive retinopathy, bilateral: Secondary | ICD-10-CM | POA: Diagnosis not present

## 2020-08-31 DIAGNOSIS — H353211 Exudative age-related macular degeneration, right eye, with active choroidal neovascularization: Secondary | ICD-10-CM | POA: Diagnosis not present

## 2020-09-01 DIAGNOSIS — I10 Essential (primary) hypertension: Secondary | ICD-10-CM | POA: Diagnosis not present

## 2020-09-01 DIAGNOSIS — M810 Age-related osteoporosis without current pathological fracture: Secondary | ICD-10-CM | POA: Diagnosis not present

## 2020-09-01 DIAGNOSIS — G301 Alzheimer's disease with late onset: Secondary | ICD-10-CM | POA: Diagnosis not present

## 2020-09-01 DIAGNOSIS — F0281 Dementia in other diseases classified elsewhere with behavioral disturbance: Secondary | ICD-10-CM | POA: Diagnosis not present

## 2020-09-01 DIAGNOSIS — I872 Venous insufficiency (chronic) (peripheral): Secondary | ICD-10-CM | POA: Diagnosis not present

## 2020-09-01 DIAGNOSIS — I272 Pulmonary hypertension, unspecified: Secondary | ICD-10-CM | POA: Diagnosis not present

## 2020-09-08 DIAGNOSIS — I10 Essential (primary) hypertension: Secondary | ICD-10-CM | POA: Diagnosis not present

## 2020-09-08 DIAGNOSIS — M810 Age-related osteoporosis without current pathological fracture: Secondary | ICD-10-CM | POA: Diagnosis not present

## 2020-09-08 DIAGNOSIS — I272 Pulmonary hypertension, unspecified: Secondary | ICD-10-CM | POA: Diagnosis not present

## 2020-09-08 DIAGNOSIS — I872 Venous insufficiency (chronic) (peripheral): Secondary | ICD-10-CM | POA: Diagnosis not present

## 2020-09-08 DIAGNOSIS — F0281 Dementia in other diseases classified elsewhere with behavioral disturbance: Secondary | ICD-10-CM | POA: Diagnosis not present

## 2020-09-08 DIAGNOSIS — G301 Alzheimer's disease with late onset: Secondary | ICD-10-CM | POA: Diagnosis not present

## 2020-09-10 DIAGNOSIS — K219 Gastro-esophageal reflux disease without esophagitis: Secondary | ICD-10-CM | POA: Diagnosis not present

## 2020-09-10 DIAGNOSIS — G4700 Insomnia, unspecified: Secondary | ICD-10-CM | POA: Diagnosis not present

## 2020-09-10 DIAGNOSIS — G301 Alzheimer's disease with late onset: Secondary | ICD-10-CM | POA: Diagnosis not present

## 2020-09-10 DIAGNOSIS — I10 Essential (primary) hypertension: Secondary | ICD-10-CM | POA: Diagnosis not present

## 2020-09-10 DIAGNOSIS — I447 Left bundle-branch block, unspecified: Secondary | ICD-10-CM | POA: Diagnosis not present

## 2020-09-10 DIAGNOSIS — M810 Age-related osteoporosis without current pathological fracture: Secondary | ICD-10-CM | POA: Diagnosis not present

## 2020-09-10 DIAGNOSIS — F3341 Major depressive disorder, recurrent, in partial remission: Secondary | ICD-10-CM | POA: Diagnosis not present

## 2020-09-10 DIAGNOSIS — E7800 Pure hypercholesterolemia, unspecified: Secondary | ICD-10-CM | POA: Diagnosis not present

## 2020-09-10 DIAGNOSIS — M5430 Sciatica, unspecified side: Secondary | ICD-10-CM | POA: Diagnosis not present

## 2020-09-10 DIAGNOSIS — G250 Essential tremor: Secondary | ICD-10-CM | POA: Diagnosis not present

## 2020-09-10 DIAGNOSIS — Z9181 History of falling: Secondary | ICD-10-CM | POA: Diagnosis not present

## 2020-09-10 DIAGNOSIS — I272 Pulmonary hypertension, unspecified: Secondary | ICD-10-CM | POA: Diagnosis not present

## 2020-09-10 DIAGNOSIS — F0281 Dementia in other diseases classified elsewhere with behavioral disturbance: Secondary | ICD-10-CM | POA: Diagnosis not present

## 2020-09-10 DIAGNOSIS — M5136 Other intervertebral disc degeneration, lumbar region: Secondary | ICD-10-CM | POA: Diagnosis not present

## 2020-09-10 DIAGNOSIS — E039 Hypothyroidism, unspecified: Secondary | ICD-10-CM | POA: Diagnosis not present

## 2020-09-10 DIAGNOSIS — I872 Venous insufficiency (chronic) (peripheral): Secondary | ICD-10-CM | POA: Diagnosis not present

## 2020-09-10 DIAGNOSIS — K449 Diaphragmatic hernia without obstruction or gangrene: Secondary | ICD-10-CM | POA: Diagnosis not present

## 2020-09-10 DIAGNOSIS — H353213 Exudative age-related macular degeneration, right eye, with inactive scar: Secondary | ICD-10-CM | POA: Diagnosis not present

## 2020-09-15 DIAGNOSIS — M810 Age-related osteoporosis without current pathological fracture: Secondary | ICD-10-CM | POA: Diagnosis not present

## 2020-09-15 DIAGNOSIS — F0281 Dementia in other diseases classified elsewhere with behavioral disturbance: Secondary | ICD-10-CM | POA: Diagnosis not present

## 2020-09-15 DIAGNOSIS — I10 Essential (primary) hypertension: Secondary | ICD-10-CM | POA: Diagnosis not present

## 2020-09-15 DIAGNOSIS — I872 Venous insufficiency (chronic) (peripheral): Secondary | ICD-10-CM | POA: Diagnosis not present

## 2020-09-15 DIAGNOSIS — I272 Pulmonary hypertension, unspecified: Secondary | ICD-10-CM | POA: Diagnosis not present

## 2020-09-15 DIAGNOSIS — G301 Alzheimer's disease with late onset: Secondary | ICD-10-CM | POA: Diagnosis not present

## 2020-09-25 DIAGNOSIS — M810 Age-related osteoporosis without current pathological fracture: Secondary | ICD-10-CM | POA: Diagnosis not present

## 2020-09-25 DIAGNOSIS — I872 Venous insufficiency (chronic) (peripheral): Secondary | ICD-10-CM | POA: Diagnosis not present

## 2020-09-25 DIAGNOSIS — I10 Essential (primary) hypertension: Secondary | ICD-10-CM | POA: Diagnosis not present

## 2020-09-25 DIAGNOSIS — G301 Alzheimer's disease with late onset: Secondary | ICD-10-CM | POA: Diagnosis not present

## 2020-09-25 DIAGNOSIS — I272 Pulmonary hypertension, unspecified: Secondary | ICD-10-CM | POA: Diagnosis not present

## 2020-09-25 DIAGNOSIS — F0281 Dementia in other diseases classified elsewhere with behavioral disturbance: Secondary | ICD-10-CM | POA: Diagnosis not present

## 2020-09-28 DIAGNOSIS — H35373 Puckering of macula, bilateral: Secondary | ICD-10-CM | POA: Diagnosis not present

## 2020-09-28 DIAGNOSIS — H353222 Exudative age-related macular degeneration, left eye, with inactive choroidal neovascularization: Secondary | ICD-10-CM | POA: Diagnosis not present

## 2020-09-28 DIAGNOSIS — H353211 Exudative age-related macular degeneration, right eye, with active choroidal neovascularization: Secondary | ICD-10-CM | POA: Diagnosis not present

## 2020-09-30 DIAGNOSIS — I10 Essential (primary) hypertension: Secondary | ICD-10-CM | POA: Diagnosis not present

## 2020-09-30 DIAGNOSIS — I272 Pulmonary hypertension, unspecified: Secondary | ICD-10-CM | POA: Diagnosis not present

## 2020-09-30 DIAGNOSIS — G301 Alzheimer's disease with late onset: Secondary | ICD-10-CM | POA: Diagnosis not present

## 2020-09-30 DIAGNOSIS — M810 Age-related osteoporosis without current pathological fracture: Secondary | ICD-10-CM | POA: Diagnosis not present

## 2020-09-30 DIAGNOSIS — I872 Venous insufficiency (chronic) (peripheral): Secondary | ICD-10-CM | POA: Diagnosis not present

## 2020-09-30 DIAGNOSIS — F0281 Dementia in other diseases classified elsewhere with behavioral disturbance: Secondary | ICD-10-CM | POA: Diagnosis not present

## 2020-10-06 DIAGNOSIS — I872 Venous insufficiency (chronic) (peripheral): Secondary | ICD-10-CM | POA: Diagnosis not present

## 2020-10-06 DIAGNOSIS — G301 Alzheimer's disease with late onset: Secondary | ICD-10-CM | POA: Diagnosis not present

## 2020-10-06 DIAGNOSIS — I10 Essential (primary) hypertension: Secondary | ICD-10-CM | POA: Diagnosis not present

## 2020-10-06 DIAGNOSIS — F0281 Dementia in other diseases classified elsewhere with behavioral disturbance: Secondary | ICD-10-CM | POA: Diagnosis not present

## 2020-10-06 DIAGNOSIS — M810 Age-related osteoporosis without current pathological fracture: Secondary | ICD-10-CM | POA: Diagnosis not present

## 2020-10-06 DIAGNOSIS — I272 Pulmonary hypertension, unspecified: Secondary | ICD-10-CM | POA: Diagnosis not present

## 2020-10-23 ENCOUNTER — Other Ambulatory Visit: Payer: Self-pay | Admitting: Internal Medicine

## 2020-11-04 ENCOUNTER — Other Ambulatory Visit: Payer: Self-pay

## 2020-11-04 ENCOUNTER — Encounter: Payer: Self-pay | Admitting: Podiatry

## 2020-11-04 ENCOUNTER — Ambulatory Visit (INDEPENDENT_AMBULATORY_CARE_PROVIDER_SITE_OTHER): Payer: Medicare Other | Admitting: Podiatry

## 2020-11-04 DIAGNOSIS — B351 Tinea unguium: Secondary | ICD-10-CM | POA: Diagnosis not present

## 2020-11-04 DIAGNOSIS — L84 Corns and callosities: Secondary | ICD-10-CM | POA: Diagnosis not present

## 2020-11-04 DIAGNOSIS — M79676 Pain in unspecified toe(s): Secondary | ICD-10-CM

## 2020-11-04 NOTE — Patient Instructions (Signed)
DRESSING CHANGES L hallux:   PHARMACY SHOPPING LIST: Neosporin Cream   APPLY A LIGHT AMOUNT OF Neosporin TO LEFT HALLUX ONCE DAILY FOR 2 WEEKS.  IF YOU EXPERIENCE INCREASED REDNESS, PAIN, SWELLING, CONTACT THE DOCTOR.

## 2020-11-05 NOTE — Progress Notes (Signed)
Subjective:  Patient ID: April Stevenson, female    DOB: 05-04-1924,  MRN: [id number]  April Stevenson presents to clinic today for painful thick toenails that are difficult to trim. Pain interferes with ambulation. Aggravating factors include wearing enclosed shoe gear. Pain is relieved with periodic professional debridement..  87 y.o. female presents with the above complaint.  Her son is present during today's visit.  Concern today of left hallux with lesion on distal tip. Relates no tenderness, no drainage. Son states Mom does shuffle when she walks, but she denies this.   Review of Systems: Negative except as noted in the HPI. Past Medical History:  Diagnosis Date  . Benign essential tremor 07/18/2018  . Bilateral nonexudative age-related macular degeneration 07/18/2018  . Cholestatic hepatitis    2001  . DDD (degenerative disc disease), lumbar 04/23/2018  . Depression   . Dry eyes, bilateral 07/18/2018  . Fatigue   . GERD (gastroesophageal reflux disease)   . Hiatal hernia with GERD 07/29/2011  . Hoarding behavior 04/23/2018  . HTN (hypertension) 07/29/2011  . Hyperlipidemia   . Hypertension   . Hypothyroidism 07/29/2011  . Insomnia   . Late onset Alzheimer's disease with behavioral disturbance ([place]) 04/23/2018  . LBBB (left bundle branch block) 07/29/2011  . Major depressive disorder 07/29/2016  . Memory impairment 12/09/2016  . Memory loss   . Osteoporosis   . Paranoid delusion ([place]) 07/18/2018  . Psychophysiological insomnia 04/23/2018  . Pulmonary HTN ([place]) 03/15/2019   2 d echo 02/28/19 EF 55-60 %, mild to moderate mitral valve regurg, mild tricuspid regurg, severely elevated estimated right ventricular systolic pressure 85 mm Hg, and mild to moderate aortic valve regurg. No diastolic CHF noted.   . Pure hypercholesterolemia 07/29/2011  . Sciatica   . Senile osteoporosis 04/23/2018   Past Surgical History:  Procedure Laterality Date  . APPENDECTOMY    .  CARDIAC CATHETERIZATION    . FACIAL COSMETIC SURGERY    . TONSILLECTOMY    . TOTAL ABDOMINAL HYSTERECTOMY W/ BILATERAL SALPINGOOPHORECTOMY      Current Outpatient Medications:  .  Calcium Carbonate-Vitamin D 600-400 MG-UNIT chew tablet, Chew 1 tablet by mouth 3 (three) times daily with meals., Disp: 60 tablet, Rfl: 3 .  Cholecalciferol (D3 HIGH POTENCY) 2000 units CAPS, Take 1 capsule (2,000 Units total) by mouth daily., Disp: 30 each, Rfl: 3 .  donepezil (ARICEPT) 10 MG tablet, TAKE 1 TABLET(10 MG) BY MOUTH AT BEDTIME, Disp: 90 tablet, Rfl: 1 .  FLUoxetine (PROZAC) 20 MG tablet, Take 1 tablet (20 mg total) by mouth daily., Disp: 90 tablet, Rfl: 1 .  furosemide (LASIX) 40 MG tablet, TAKE 1 TABLET(40 MG) BY MOUTH DAILY, Disp: 90 tablet, Rfl: 1 .  irbesartan (AVAPRO) 300 MG tablet, TAKE 1 TABLET(300 MG) BY MOUTH DAILY, Disp: 90 tablet, Rfl: 1 .  levothyroxine (SYNTHROID) 25 MCG tablet, TAKE 1 TABLET(25 MCG) BY MOUTH AT BEDTIME, Disp: 90 tablet, Rfl: 1 .  Melatonin 10 MG TABS, Take 1 tablet by mouth at bedtime., Disp: 90 tablet, Rfl: 1 .  Multiple Vitamins-Minerals (PRESERVISION AREDS 2) CAPS, Take 1 capsule by mouth 2 (two) times daily., Disp: , Rfl:  .  omeprazole (PRILOSEC) 20 MG capsule, TAKE 1 CAPSULE(20 MG) BY MOUTH DAILY, Disp: 90 capsule, Rfl: 1 .  potassium chloride SA (KLOR-CON) 20 MEQ tablet, Take 1 tablet (20 mEq total) by mouth daily., Disp: 90 tablet, Rfl: 1 .  propranolol ER (INDERAL LA) 60 MG 24 hr capsule, Take 1 capsule (60 mg  total) by mouth daily., Disp: 90 capsule, Rfl: 1 .  traZODone (DESYREL) 50 MG tablet, TAKE 1 TABLET(50 MG) BY MOUTH AT BEDTIME, Disp: 90 tablet, Rfl: 1 .  vitamin C (ASCORBIC ACID) 500 MG tablet, Take 1 tablet (500 mg total) by mouth daily., Disp: 30 tablet, Rfl: 11 Allergies  Allergen Reactions  . Garlic   . Lac Bovis   . Lanolin   . Penicillins     ? rash  . Statins     myalgia   Social History   Occupational History  . Occupation: model     Comment: Location manager  . Occupation: singer  Tobacco Use  . Smoking status: Former Smoker    Packs/day: 0.50    Years: 20.00    Pack years: 10.00    Types: Cigarettes  . Smokeless tobacco: Never Used  Vaping Use  . Vaping Use: Never used  Substance and Sexual Activity  . Alcohol use: Yes    Alcohol/week: 1.0 standard drink    Types: 1 Glasses of wine per week    Comment: 5-6 times a month.  . Drug use: Never  . Sexual activity: Not Currently    Partners: Male    Objective:   Constitutional April Stevenson is a pleasant 87 y.o. Caucasian female, WD, WN in NAD.Marland Kitchen AAO x 3.   Vascular Capillary refill time to digits immediate b/l. Palpable pedal pulses b/l LE. Pedal hair present. Lower extremity skin temperature gradient within normal limits. No edema noted b/l lower extremities.  No cyanosis or clubbing noted.  Neurologic Normal speech. Oriented to person, place, and time. Epicritic sensation to light touch grossly present bilaterally. Protective sensation intact 5/5 intact bilaterally with 10g monofilament b/l. Vibratory sensation intact b/l. Clonus negative b/l.  Dermatologic Pedal skin with normal turgor, texture and tone bilaterally. No open wounds bilaterally. No interdigital macerations bilaterally. Toenails 1-5 b/l elongated, discolored, dystrophic, thickened, crumbly with subungual debris and tenderness to dorsal palpation. Hyperkeratotic lesion distal tip of left hallux with subdermal hemorrhage. No erythema, no edema, no active drainage, no flocculence, no abnormal warmth or coolness.  Orthopedic: Normal muscle strength 5/5 to all lower extremity muscle groups bilaterally. No pain crepitus or joint limitation noted with ROM b/l. No gross bony deformities bilaterally.   Radiographs: None Assessment:   1. Pain due to onychomycosis of toenail   2. Pre-ulcerative calluses    Plan:  Patient was evaluated and treated and all questions answered.  Onychomycosis with  pain -Nails palliatively debridement as below -Educated on self-care  Procedure: Nail Debridement Rationale: Pain Type of Debridement: manual, sharp debridement. Instrumentation: Nail nipper, rotary burr. Number of Nails: 10 -Examined patient. -Preulcerative callus left hallux pared. Light bleeding addressed with Lumicain Hemostatic Solution. Triple antibiotic ointment and band-aid applied. Apply Neosporin to left hallux once daily for 2 weeks. Continue digital toe cap daily. She may let digit get air if she is sitting up watching television. Son related understanding. Written instructions also given. Call office if she encounters any problems such as increased redness, swelling, pain or drainage. Son related understanding. -Toenails 1-5 b/l were debrided in length and girth with sterile nail nippers and dremel without iatrogenic bleeding.  -Patient to report any pedal injuries to medical professional immediately. -Patient to continue soft, supportive shoe gear daily. -Patient/POA to call should there be question/concern in the interim.  Return in about 3 months (around 02/02/2021).  Marzetta Board, DPM

## 2020-11-06 ENCOUNTER — Ambulatory Visit (INDEPENDENT_AMBULATORY_CARE_PROVIDER_SITE_OTHER): Payer: Medicare Other | Admitting: Nurse Practitioner

## 2020-11-06 ENCOUNTER — Other Ambulatory Visit: Payer: Self-pay

## 2020-11-06 ENCOUNTER — Telehealth: Payer: Self-pay

## 2020-11-06 ENCOUNTER — Encounter: Payer: Self-pay | Admitting: Nurse Practitioner

## 2020-11-06 DIAGNOSIS — Z0000 Encounter for general adult medical examination without abnormal findings: Secondary | ICD-10-CM | POA: Diagnosis not present

## 2020-11-06 NOTE — Telephone Encounter (Signed)
Ms. April Stevenson, April Stevenson are scheduled for a virtual visit with your provider today.    Just as we do with appointments in the office, we must obtain your consent to participate.  Your consent will be active for this visit and any virtual visit you may have with one of our providers in the next 365 days.    If you have a MyChart account, I can also send a copy of this consent to you electronically.  All virtual visits are billed to your insurance company just like a traditional visit in the office.  As this is a virtual visit, video technology does not allow for your provider to perform a traditional examination.  This may limit your provider's ability to fully assess your condition.  If your provider identifies any concerns that need to be evaluated in person or the need to arrange testing such as labs, EKG, etc, we will make arrangements to do so.    Although advances in technology are sophisticated, we cannot ensure that it will always work on either your end or our end.  If the connection with a video visit is poor, we may have to switch to a telephone visit.  With either a video or telephone visit, we are not always able to ensure that we have a secure connection.   I need to obtain your verbal consent now.   Are you willing to proceed with your visit today?   [name] has provided verbal consent on 11/06/2020 for a virtual visit (video or telephone).   Carroll Kinds, [facility] 11/06/2020  9:27 AM

## 2020-11-06 NOTE — Progress Notes (Signed)
This service is provided via telemedicine  No vital signs collected/recorded due to the encounter was a telemedicine visit.   Location of patient (ex: home, work): Home  Patient consents to a telephone visit: Yes, see encounter dated 11/06/2020  Location of the provider (ex: office, home):  Home  Name of any referring provider:  Hollace Kinnier, DO  Names of all persons participating in the telemedicine service and their role in the encounter:  Sherrie Mustache, Nurse Practitioner, Carroll Kinds, CMA, patient and patient's son [name].  Time spent on call: 11 minutes with medical assistant

## 2020-11-06 NOTE — Patient Instructions (Signed)
April Stevenson , Thank you for taking time to come for your Medicare Wellness Visit. I appreciate your ongoing commitment to your health goals. Please review the following plan we discussed and let me know if I can assist you in the future.   Screening recommendations/referrals: Colonoscopy aged out Mammogram aged out Bone Density up to date Recommended yearly ophthalmology/optometry visit for glaucoma screening and checkup Recommended yearly dental visit for hygiene and checkup  Vaccinations: Influenza vaccine RECOMMENDED Pneumococcal vaccine up to date Tdap vaccine RECOMMENDED- to get at local pharmacy Shingles vaccine -up to date COVID booster- RECOMMENDED- to get at local pharmacy     Advanced directives: on file  Conditions/risks identified: advanced age, progressive memory loss  Next appointment: yearly    Preventive Care 85 Years and Older, Female Preventive care refers to lifestyle choices and visits with your health care provider that can promote health and wellness. What does preventive care include?  A yearly physical exam. This is also called an annual well check.  Dental exams once or twice a year.  Routine eye exams. Ask your health care provider how often you should have your eyes checked.  Personal lifestyle choices, including:  Daily care of your teeth and gums.  Regular physical activity.  Eating a healthy diet.  Avoiding tobacco and drug use.  Limiting alcohol use.  Practicing safe sex.  Taking low-dose aspirin every day.  Taking vitamin and mineral supplements as recommended by your health care provider. What happens during an annual well check? The services and screenings done by your health care provider during your annual well check will depend on your age, overall health, lifestyle risk factors, and family history of disease. Counseling  Your health care provider may ask you questions about your:  Alcohol use.  Tobacco  use.  Drug use.  Emotional well-being.  Home and relationship well-being.  Sexual activity.  Eating habits.  History of falls.  Memory and ability to understand (cognition).  Work and work Statistician.  Reproductive health. Screening  You may have the following tests or measurements:  Height, weight, and BMI.  Blood pressure.  Lipid and cholesterol levels. These may be checked every 5 years, or more frequently if you are over 40 years old.  Skin check.  Lung cancer screening. You may have this screening every year starting at age 85 if you have a 30-pack-year history of smoking and currently smoke or have quit within the past 15 years.  Fecal occult blood test (FOBT) of the stool. You may have this test every year starting at age 85.  Flexible sigmoidoscopy or colonoscopy. You may have a sigmoidoscopy every 5 years or a colonoscopy every 10 years starting at age 66.  Hepatitis C blood test.  Hepatitis B blood test.  Sexually transmitted disease (STD) testing.  Diabetes screening. This is done by checking your blood sugar (glucose) after you have not eaten for a while (fasting). You may have this done every 1-3 years.  Bone density scan. This is done to screen for osteoporosis. You may have this done starting at age 85.  Mammogram. This may be done every 1-2 years. Talk to your health care provider about how often you should have regular mammograms. Talk with your health care provider about your test results, treatment options, and if necessary, the need for more tests. Vaccines  Your health care provider may recommend certain vaccines, such as:  Influenza vaccine. This is recommended every year.  Tetanus, diphtheria, and acellular pertussis (Tdap,  Td) vaccine. You may need a Td booster every 10 years.  Zoster vaccine. You may need this after age 85.  Pneumococcal 13-valent conjugate (PCV13) vaccine. One dose is recommended after age 85.  Pneumococcal  polysaccharide (PPSV23) vaccine. One dose is recommended after age 85. Talk to your health care provider about which screenings and vaccines you need and how often you need them. This information is not intended to replace advice given to you by your health care provider. Make sure you discuss any questions you have with your health care provider. Document Released: 10/30/2015 Document Revised: 06/22/2016 Document Reviewed: 08/04/2015 Elsevier Interactive Patient Education  2017 [place] Prevention in the Home Falls can cause injuries. They can happen to people of all ages. There are many things you can do to make your home safe and to help prevent falls. What can I do on the outside of my home?  Regularly fix the edges of walkways and driveways and fix any cracks.  Remove anything that might make you trip as you walk through a door, such as a raised step or threshold.  Trim any bushes or trees on the path to your home.  Use bright outdoor lighting.  Clear any walking paths of anything that might make someone trip, such as rocks or tools.  Regularly check to see if handrails are loose or broken. Make sure that both sides of any steps have handrails.  Any raised decks and porches should have guardrails on the edges.  Have any leaves, snow, or ice cleared regularly.  Use sand or salt on walking paths during winter.  Clean up any spills in your garage right away. This includes oil or grease spills. What can I do in the bathroom?  Use night lights.  Install grab bars by the toilet and in the tub and shower. Do not use towel bars as grab bars.  Use non-skid mats or decals in the tub or shower.  If you need to sit down in the shower, use a plastic, non-slip stool.  Keep the floor dry. Clean up any water that spills on the floor as soon as it happens.  Remove soap buildup in the tub or shower regularly.  Attach bath mats securely with double-sided non-slip rug  tape.  Do not have throw rugs and other things on the floor that can make you trip. What can I do in the bedroom?  Use night lights.  Make sure that you have a light by your bed that is easy to reach.  Do not use any sheets or blankets that are too big for your bed. They should not hang down onto the floor.  Have a firm chair that has side arms. You can use this for support while you get dressed.  Do not have throw rugs and other things on the floor that can make you trip. What can I do in the kitchen?  Clean up any spills right away.  Avoid walking on wet floors.  Keep items that you use a lot in easy-to-reach places.  If you need to reach something above you, use a strong step stool that has a grab bar.  Keep electrical cords out of the way.  Do not use floor polish or wax that makes floors slippery. If you must use wax, use non-skid floor wax.  Do not have throw rugs and other things on the floor that can make you trip. What can I do with my stairs?  Do not  leave any items on the stairs.  Make sure that there are handrails on both sides of the stairs and use them. Fix handrails that are broken or loose. Make sure that handrails are as long as the stairways.  Check any carpeting to make sure that it is firmly attached to the stairs. Fix any carpet that is loose or worn.  Avoid having throw rugs at the top or bottom of the stairs. If you do have throw rugs, attach them to the floor with carpet tape.  Make sure that you have a light switch at the top of the stairs and the bottom of the stairs. If you do not have them, ask someone to add them for you. What else can I do to help prevent falls?  Wear shoes that:  Do not have high heels.  Have rubber bottoms.  Are comfortable and fit you well.  Are closed at the toe. Do not wear sandals.  If you use a stepladder:  Make sure that it is fully opened. Do not climb a closed stepladder.  Make sure that both sides of the  stepladder are locked into place.  Ask someone to hold it for you, if possible.  Clearly mark and make sure that you can see:  Any grab bars or handrails.  First and last steps.  Where the edge of each step is.  Use tools that help you move around (mobility aids) if they are needed. These include:  Canes.  Walkers.  Scooters.  Crutches.  Turn on the lights when you go into a dark area. Replace any light bulbs as soon as they burn out.  Set up your furniture so you have a clear path. Avoid moving your furniture around.  If any of your floors are uneven, fix them.  If there are any pets around you, be aware of where they are.  Review your medicines with your doctor. Some medicines can make you feel dizzy. This can increase your chance of falling. Ask your doctor what other things that you can do to help prevent falls. This information is not intended to replace advice given to you by your health care provider. Make sure you discuss any questions you have with your health care provider. Document Released: 07/30/2009 Document Revised: 03/10/2016 Document Reviewed: 11/07/2014 Elsevier Interactive Patient Education  2017 [employer].

## 2020-11-06 NOTE — Progress Notes (Signed)
Subjective:   April Stevenson is a 87 y.o. female who presents for Medicare Annual (Subsequent) preventive examination.  Review of Systems     Cardiac Risk Factors include: advanced age (>22men, >62 women);hypertension;dyslipidemia     Objective:    There were no vitals filed for this visit. There is no height or weight on file to calculate BMI.  Advanced Directives 11/06/2020 08/11/2020 08/03/2020 01/27/2020 01/14/2020 11/26/2019 03/15/2019  Does Patient Have a Medical Advance Directive? Yes Yes Yes Yes Yes Yes Yes  Type of Paramedic of [place];Living will;Out of facility DNR (pink MOST or yellow form) Out of facility DNR (pink MOST or yellow form);Healthcare Power of [employer] of facility DNR (pink MOST or yellow form) [place];Out of facility DNR (pink MOST or yellow form) Out of facility DNR (pink MOST or yellow form) Out of facility DNR (pink MOST or yellow form) [place];Living will;Out of facility DNR (pink MOST or yellow form)  Does patient want to make changes to medical advance directive? No - Patient declined No - Patient declined No - Patient declined No - Patient declined No - Patient declined No - Patient declined -  Copy of [place] in Chart? - Yes - validated most recent copy scanned in chart (See row information) - Yes - validated most recent copy scanned in chart (See row information) - - Yes - validated most recent copy scanned in chart (See row information)  Pre-existing out of facility DNR order (yellow form or pink MOST form) - - - Pink MOST/Yellow Form most recent copy in chart - Physician notified to receive inpatient order Pink MOST/Yellow Form most recent copy in chart - Physician notified to receive inpatient order - Yellow form placed in chart (order not valid for inpatient use);Pink MOST form placed in chart (order not valid for inpatient use)    Current Medications  (verified) Outpatient Encounter Medications as of 11/06/2020  Medication Sig  . Calcium Carbonate-Vitamin D 600-400 MG-UNIT chew tablet Chew 1 tablet by mouth 3 (three) times daily with meals.  . Cholecalciferol (D3 HIGH POTENCY) 2000 units CAPS Take 1 capsule (2,000 Units total) by mouth daily.  Marland Kitchen donepezil (ARICEPT) 10 MG tablet TAKE 1 TABLET(10 MG) BY MOUTH AT BEDTIME  . FLUoxetine (PROZAC) 20 MG tablet Take 1 tablet (20 mg total) by mouth daily.  . furosemide (LASIX) 40 MG tablet TAKE 1 TABLET(40 MG) BY MOUTH DAILY  . irbesartan (AVAPRO) 300 MG tablet TAKE 1 TABLET(300 MG) BY MOUTH DAILY  . levothyroxine (SYNTHROID) 25 MCG tablet TAKE 1 TABLET(25 MCG) BY MOUTH AT BEDTIME  . Melatonin 10 MG TABS Take 1 tablet by mouth at bedtime.  . Multiple Vitamins-Minerals (PRESERVISION AREDS 2) CAPS Take 1 capsule by mouth 2 (two) times daily.  Marland Kitchen omeprazole (PRILOSEC) 20 MG capsule TAKE 1 CAPSULE(20 MG) BY MOUTH DAILY  . potassium chloride SA (KLOR-CON) 20 MEQ tablet Take 1 tablet (20 mEq total) by mouth daily.  . propranolol ER (INDERAL LA) 60 MG 24 hr capsule Take 1 capsule (60 mg total) by mouth daily.  . traZODone (DESYREL) 50 MG tablet TAKE 1 TABLET(50 MG) BY MOUTH AT BEDTIME  . vitamin C (ASCORBIC ACID) 500 MG tablet Take 1 tablet (500 mg total) by mouth daily.   No facility-administered encounter medications on file as of 11/06/2020.    Allergies (verified) Garlic, Lac bovis, Lanolin, Penicillins, and Statins   History: Past Medical History:  Diagnosis Date  .  Benign essential tremor 07/18/2018  . Bilateral nonexudative age-related macular degeneration 07/18/2018  . Cholestatic hepatitis    2001  . DDD (degenerative disc disease), lumbar 04/23/2018  . Depression   . Dry eyes, bilateral 07/18/2018  . Fatigue   . GERD (gastroesophageal reflux disease)   . Hiatal hernia with GERD 07/29/2011  . Hoarding behavior 04/23/2018  . HTN (hypertension) 07/29/2011  . Hyperlipidemia   . Hypertension    . Hypothyroidism 07/29/2011  . Insomnia   . Late onset Alzheimer's disease with behavioral disturbance ([place]) 04/23/2018  . LBBB (left bundle branch block) 07/29/2011  . Major depressive disorder 07/29/2016  . Memory impairment 12/09/2016  . Memory loss   . Osteoporosis   . Paranoid delusion ([place]) 07/18/2018  . Psychophysiological insomnia 04/23/2018  . Pulmonary HTN ([place]) 03/15/2019   2 d echo 02/28/19 EF 55-60 %, mild to moderate mitral valve regurg, mild tricuspid regurg, severely elevated estimated right ventricular systolic pressure 85 mm Hg, and mild to moderate aortic valve regurg. No diastolic CHF noted.   . Pure hypercholesterolemia 07/29/2011  . Sciatica   . Senile osteoporosis 04/23/2018   Past Surgical History:  Procedure Laterality Date  . APPENDECTOMY    . CARDIAC CATHETERIZATION    . FACIAL COSMETIC SURGERY    . TONSILLECTOMY    . TOTAL ABDOMINAL HYSTERECTOMY W/ BILATERAL SALPINGOOPHORECTOMY     Family History  Problem Relation Age of Onset  . Heart attack Father    Social History   Socioeconomic History  . Marital status: Widowed    Spouse name: Mikki Santee  . Number of children: 1  . Years of education: some college, took courses as adult also  . Highest education level: Some college, no degree  Occupational History  . Occupation: model    Comment: Location manager  . Occupation: singer  Tobacco Use  . Smoking status: Former Smoker    Packs/day: 0.50    Years: 20.00    Pack years: 10.00    Types: Cigarettes  . Smokeless tobacco: Never Used  Vaping Use  . Vaping Use: Never used  Substance and Sexual Activity  . Alcohol use: Yes    Alcohol/week: 1.0 standard drink    Types: 1 Glasses of wine per week    Comment: 5-6 times a month.  . Drug use: Never  . Sexual activity: Not Currently    Partners: Male  Other Topics Concern  . Not on file  Social History Narrative   Was married three times and outlived all three husbands.   Social Determinants of Health    Financial Resource Strain: Not on file  Food Insecurity: Not on file  Transportation Needs: Not on file  Physical Activity: Not on file  Stress: Not on file  Social Connections: Not on file    Tobacco Counseling Counseling given: Not Answered   Clinical Intake:  Pre-visit preparation completed: Yes  Pain : No/denies pain     BMI - recorded: 22 Nutritional Status: BMI of 19-24  Normal Nutritional Risks: None Diabetes: No  How often do you need to have someone help you when you read instructions, pamphlets, or other written materials from your doctor or pharmacy?: 5 - [place]         Activities of Daily Living In your present state of health, do you have any difficulty performing the following activities: 11/06/2020  Hearing? Y  Vision? Y  Difficulty concentrating or making decisions? Y  Walking or climbing stairs? N  Dressing or bathing? N  Doing errands, shopping? Y  Preparing Food and eating ? N  Comment family helps  Using the Toilet? N  In the past six months, have you accidently leaked urine? Y  Do you have problems with loss of bowel control? Y  Managing your Medications? Y  Managing your Finances? Y  Housekeeping or managing your Housekeeping? Y  Some recent data might be hidden    Patient Care Team: Kermit Balo, DO as PCP - General (Geriatric Medicine) Jake Bathe, MD as PCP - Cardiology (Cardiology)  Indicate any recent Medical Services you may have received from other than [facility] providers in the past year (date may be approximate).     Assessment:   This is a routine wellness examination for [place].  Hearing/Vision screen  Hearing Screening   125Hz  250Hz  500Hz  1000Hz  2000Hz  3000Hz  4000Hz  6000Hz  8000Hz   Right ear:           Left ear:           Comments: Patient has hearing problems. Patient uses "[employer]".  Vision Screening Comments: Patient has problems with right eye. Has wet macular degeneration. Now having  problems with left eye. Sees Dr.  Dietary issues and exercise activities discussed: Current Exercise Habits: The patient does not participate in regular exercise at present  Goals    . Increase physical activity      Depression Screen PHQ 2/9 Scores 11/06/2020 08/03/2020 03/20/2019 10/24/2018 07/18/2018 05/02/2018  PHQ - 2 Score 0 0 0 0 0 1    Fall Risk Fall Risk  11/06/2020 08/11/2020 08/03/2020 01/27/2020 03/20/2019  Falls in the past year? 1 1 1 1  0  Number falls in past yr: 1 1 1  0 0  Comment - - 5 falls since april 1 last week - -  Injury with Fall? 0 1 1 0 0  Comment - - bruising on the arm - -    FALL RISK PREVENTION PERTAINING TO THE HOME:  Any stairs in or around the home? Yes  If so, are there any without handrails? No  Home free of loose throw rugs in walkways, pet beds, electrical cords, etc? Yes  Adequate lighting in your home to reduce risk of falls? Yes   ASSISTIVE DEVICES UTILIZED TO PREVENT FALLS:  Life alert? No  Use of a cane, walker or w/c? No  Grab bars in the bathroom? Yes  Shower chair or bench in shower? Yes  Elevated toilet seat or a handicapped toilet? No   TIMED UP AND GO:  Was the test performed? No .    Cognitive Function: MMSE - Mini Mental State Exam 04/12/2018  Orientation to time 1  Orientation to Place 5  Registration 3  Attention/ Calculation 5  Recall 3  Language- name 2 objects 2  Language- repeat 1  Language- follow 3 step command 3  Language- read & follow direction 1  Write a sentence 1  Copy design 1  Total score 26     6CIT Screen 11/06/2020  What Year? 4 points  What month? 0 points  What time? 3 points  Count back from 20 0 points  Months in reverse 0 points  Repeat phrase 10 points  Total Score 17    Immunizations Immunization History  Administered Date(s) Administered  . Influenza Split 08/14/2009, 06/29/2010, 07/19/2011, 07/29/2016  . Influenza, High Dose Seasonal PF 08/08/2012, 07/26/2013, 09/17/2014,  07/31/2015, 08/15/2019  . Influenza, Quadrivalent, Recombinant, Inj, Pf 07/19/2017  .  Influenza,inj,Quad PF,6+ Mos 08/16/2018  . Influenza,trivalent, recombinat, inj, PF 07/26/2016  . Moderna Sars-Covid-2 Vaccination 10/22/2019, 11/19/2019  . Pneumococcal Conjugate-13 08/21/2009  . Pneumococcal Polysaccharide-23 04/20/2018  . Td 10/13/1997  . Tdap 08/31/2006  . Zoster 06/15/2011  . Zoster Recombinat (Shingrix) 05/25/2018, 08/19/2018    TDAP status: Due, Education has been provided regarding the importance of this vaccine. Advised may receive this vaccine at local pharmacy or Health Dept. Aware to provide a copy of the vaccination record if obtained from local pharmacy or Health Dept. Verbalized acceptance and understanding.  Flu Vaccine status: Up to date  Pneumococcal vaccine status: Up to date  Covid-19 vaccine status: Completed vaccines  Qualifies for Shingles Vaccine? Yes   Zostavax completed Yes   Shingrix Completed?: Yes  Screening Tests Health Maintenance  Topic Date Due  . INFLUENZA VACCINE  05/17/2020  . COVID-19 Vaccine (3 - Booster for Moderna series) 05/18/2020  . TETANUS/TDAP  05/04/2028  . DEXA SCAN  Completed  . PNA vac Low Risk Adult  Completed    Health Maintenance  Health Maintenance Due  Topic Date Due  . INFLUENZA VACCINE  05/17/2020  . COVID-19 Vaccine (3 - Booster for Moderna series) 05/18/2020    Colorectal cancer screening: No longer required.   Mammogram status: No longer required due to aged out.  Bone Density status: Completed 12/17/2018. Results reflect: Bone density results: OSTEOPENIA. Repeat every 2 years.  Lung Cancer Screening: (Low Dose CT Chest recommended if Age 104-80 years, 30 pack-year currently smoking OR have quit w/in 15years.) does not qualify.   Lung Cancer Screening Referral: na  Additional Screening:  Hepatitis C Screening: does not qualify  Vision Screening: Recommended annual ophthalmology exams for early detection  of glaucoma and other disorders of the eye. Is the patient up to date with their annual eye exam?  Yes  Who is the provider or what is the name of the office in which the patient attends annual eye exams? [facility] If pt is not established with a provider, would they like to be referred to a provider to establish care? No .   Dental Screening: Recommended annual dental exams for proper oral hygiene  Community Resource Referral / Chronic Care Management: CRR required this visit?  No   CCM required this visit?  No      Plan:     I have personally reviewed and noted the following in the patient's chart:   . Medical and social history . Use of alcohol, tobacco or illicit drugs  . Current medications and supplements . Functional ability and status . Nutritional status . Physical activity . Advanced directives . List of other physicians . Hospitalizations, surgeries, and ER visits in previous 12 months . Vitals . Screenings to include cognitive, depression, and falls . Referrals and appointments  In addition, I have reviewed and discussed with patient certain preventive protocols, quality metrics, and best practice recommendations. A written personalized care plan for preventive services as well as general preventive health recommendations were provided to patient.     Lauree Chandler, NP   11/06/2020    Virtual Visit via Telephone Note  I connected with@ on 11/06/20 at  9:00 AM EST by telephone and verified that I am speaking with the correct person using two identifiers.  Location: Patient: home Provider: home remote   I discussed the limitations, risks, security and privacy concerns of performing an evaluation and management service by telephone and the availability of in person appointments. I also  discussed with the patient that there may be a patient responsible charge related to this service. The patient expressed understanding and agreed to proceed.   I discussed the  assessment and treatment plan with the patient. The patient was provided an opportunity to ask questions and all were answered. The patient agreed with the plan and demonstrated an understanding of the instructions.   The patient was advised to call back or seek an in-person evaluation if the symptoms worsen or if the condition fails to improve as anticipated.  I provided 18 minutes of non-face-to-face time during this encounter.  Janene Harvey. [employer] printed and mailed

## 2020-11-23 DIAGNOSIS — H353231 Exudative age-related macular degeneration, bilateral, with active choroidal neovascularization: Secondary | ICD-10-CM | POA: Diagnosis not present

## 2020-11-23 DIAGNOSIS — H35033 Hypertensive retinopathy, bilateral: Secondary | ICD-10-CM | POA: Diagnosis not present

## 2020-11-23 DIAGNOSIS — H31091 Other chorioretinal scars, right eye: Secondary | ICD-10-CM | POA: Diagnosis not present

## 2020-11-23 DIAGNOSIS — H35373 Puckering of macula, bilateral: Secondary | ICD-10-CM | POA: Diagnosis not present

## 2020-12-07 ENCOUNTER — Encounter: Payer: Self-pay | Admitting: Internal Medicine

## 2020-12-21 DIAGNOSIS — H35373 Puckering of macula, bilateral: Secondary | ICD-10-CM | POA: Diagnosis not present

## 2020-12-21 DIAGNOSIS — H353231 Exudative age-related macular degeneration, bilateral, with active choroidal neovascularization: Secondary | ICD-10-CM | POA: Diagnosis not present

## 2021-01-14 ENCOUNTER — Ambulatory Visit
Admission: RE | Admit: 2021-01-14 | Discharge: 2021-01-14 | Disposition: A | Discharge status: Home / Self Care | Payer: Medicare Other | Source: Ambulatory Visit | Attending: Family | Admitting: Family

## 2021-01-14 ENCOUNTER — Encounter: Payer: Self-pay | Admitting: Family

## 2021-01-14 ENCOUNTER — Ambulatory Visit (INDEPENDENT_AMBULATORY_CARE_PROVIDER_SITE_OTHER): Payer: Medicare Other | Admitting: Family

## 2021-01-14 ENCOUNTER — Other Ambulatory Visit: Payer: Self-pay

## 2021-01-14 VITALS — BP 120/64 | HR 58 | Temp 97.1°F | Resp 16 | Ht 69.0 in | Wt 141.6 lb

## 2021-01-14 DIAGNOSIS — M542 Cervicalgia: Secondary | ICD-10-CM | POA: Diagnosis not present

## 2021-01-14 DIAGNOSIS — Y92009 Unspecified place in unspecified non-institutional (private) residence as the place of occurrence of the external cause: Secondary | ICD-10-CM

## 2021-01-14 DIAGNOSIS — R350 Frequency of micturition: Secondary | ICD-10-CM

## 2021-01-14 DIAGNOSIS — S40212A Abrasion of left shoulder, initial encounter: Secondary | ICD-10-CM | POA: Diagnosis not present

## 2021-01-14 DIAGNOSIS — W19XXXA Unspecified fall, initial encounter: Secondary | ICD-10-CM

## 2021-01-14 DIAGNOSIS — M25512 Pain in left shoulder: Secondary | ICD-10-CM | POA: Diagnosis not present

## 2021-01-14 DIAGNOSIS — M19012 Primary osteoarthritis, left shoulder: Secondary | ICD-10-CM | POA: Diagnosis not present

## 2021-01-14 DIAGNOSIS — R443 Hallucinations, unspecified: Secondary | ICD-10-CM | POA: Diagnosis not present

## 2021-01-14 LAB — POCT URINALYSIS DIPSTICK
Bilirubin, UA: NEGATIVE
Blood, UA: NEGATIVE
Glucose, UA: NEGATIVE
Ketones, UA: NEGATIVE
Nitrite, UA: NEGATIVE
Protein, UA: NEGATIVE
Spec Grav, UA: 1.015 (ref 1.010–1.025)
Urobilinogen, UA: 0.2 E.U./dL
pH, UA: 7.5 (ref 5.0–8.0)

## 2021-01-14 IMAGING — CR DG CERVICAL SPINE COMPLETE 4+V
5 series · 5 of 5 positions shown · non-contrast
Comparison: None.

CLINICAL DATA: [AGE] with cervical neck pain after fall. Fall
yesterday. Pain on the left side of neck and left shoulder.

EXAM:
CERVICAL SPINE - COMPLETE 4+ VIEW

[w cervical spine lat]
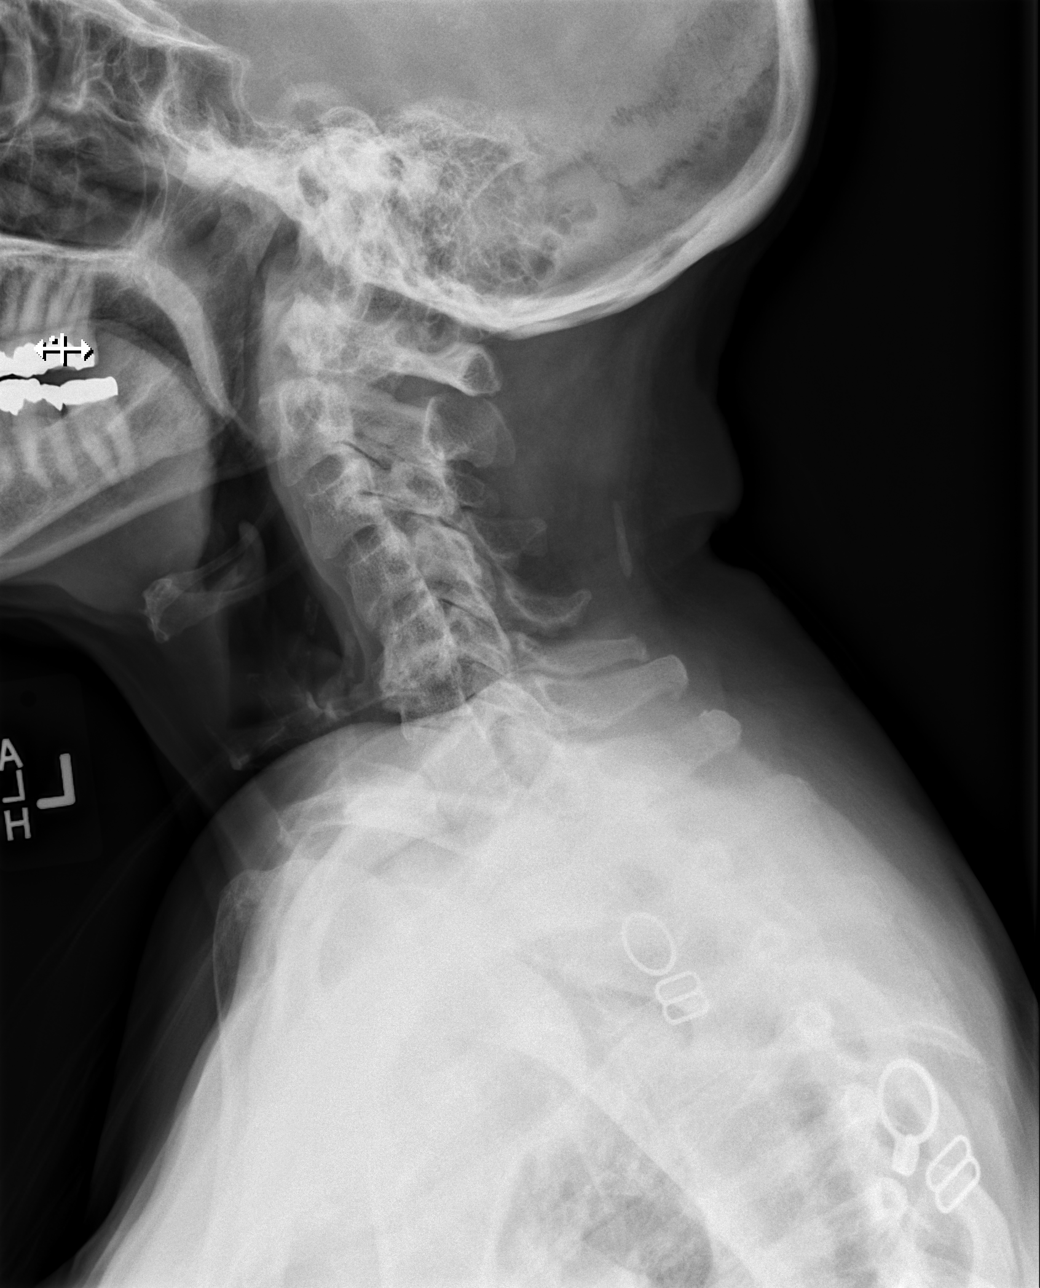

[w cervical spine ap_obl (1 of 2)]
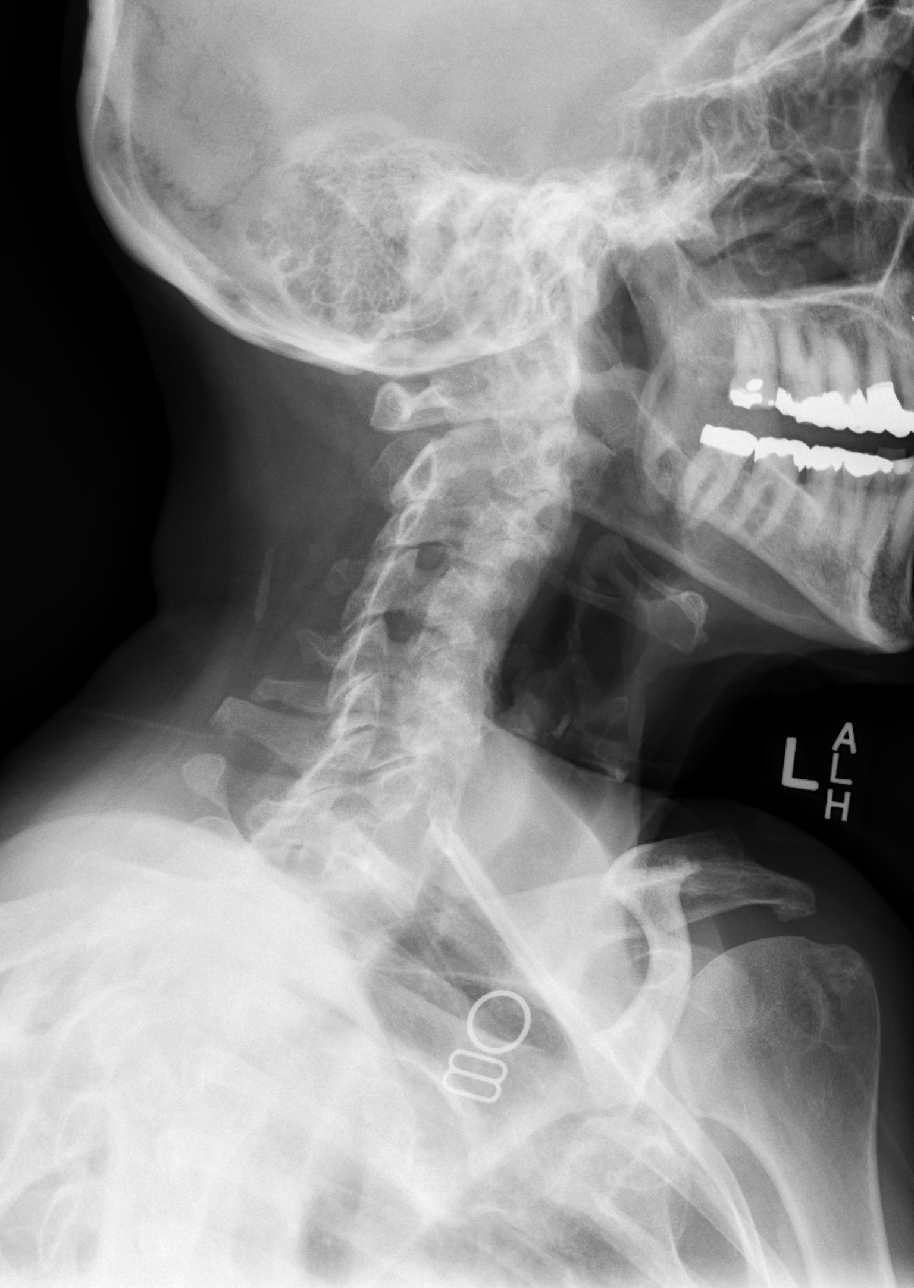

[w cervical spine ap_obl (2 of 2)]
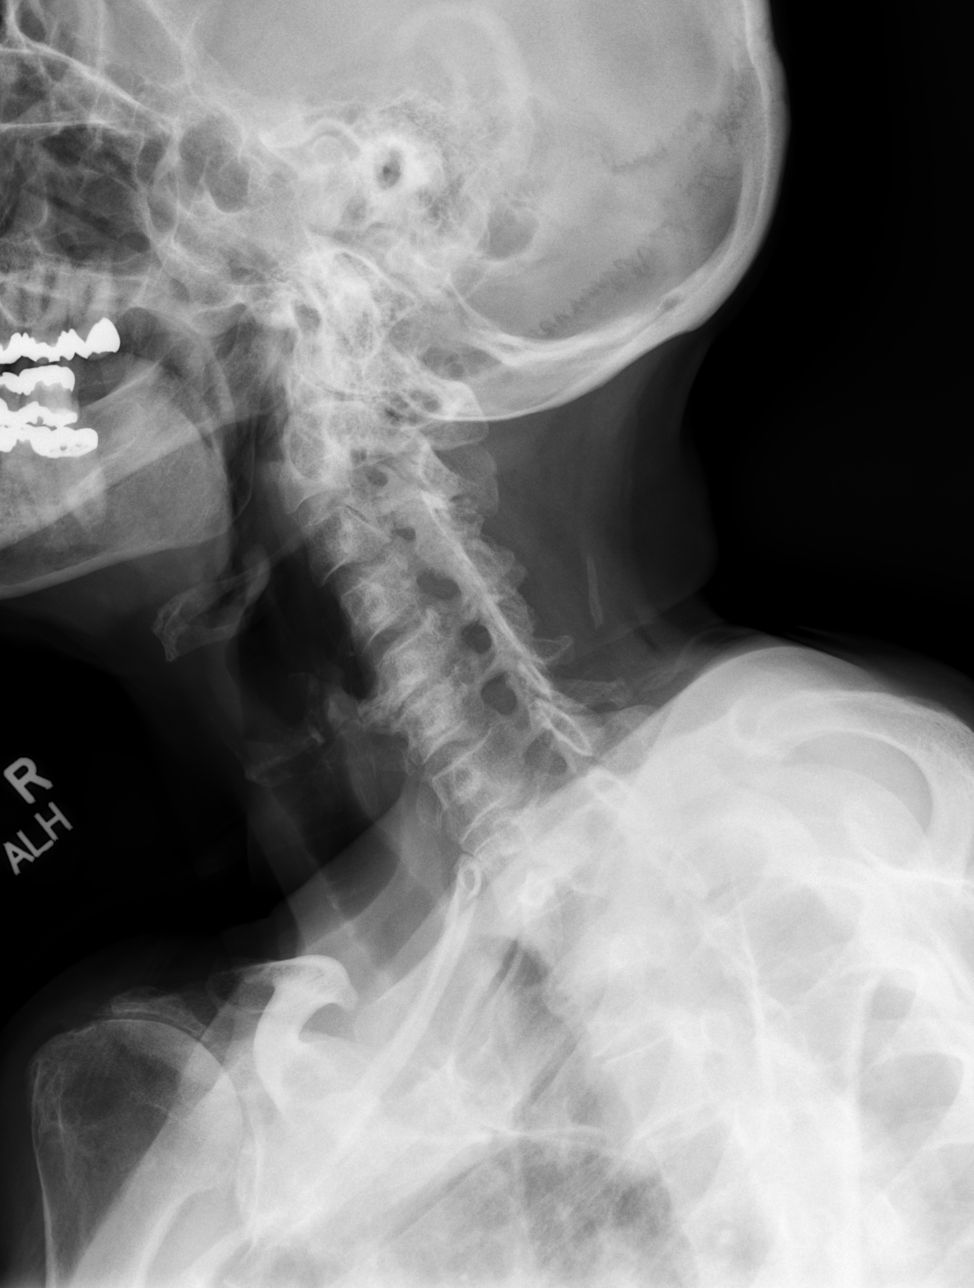

[w cervical spine ap]
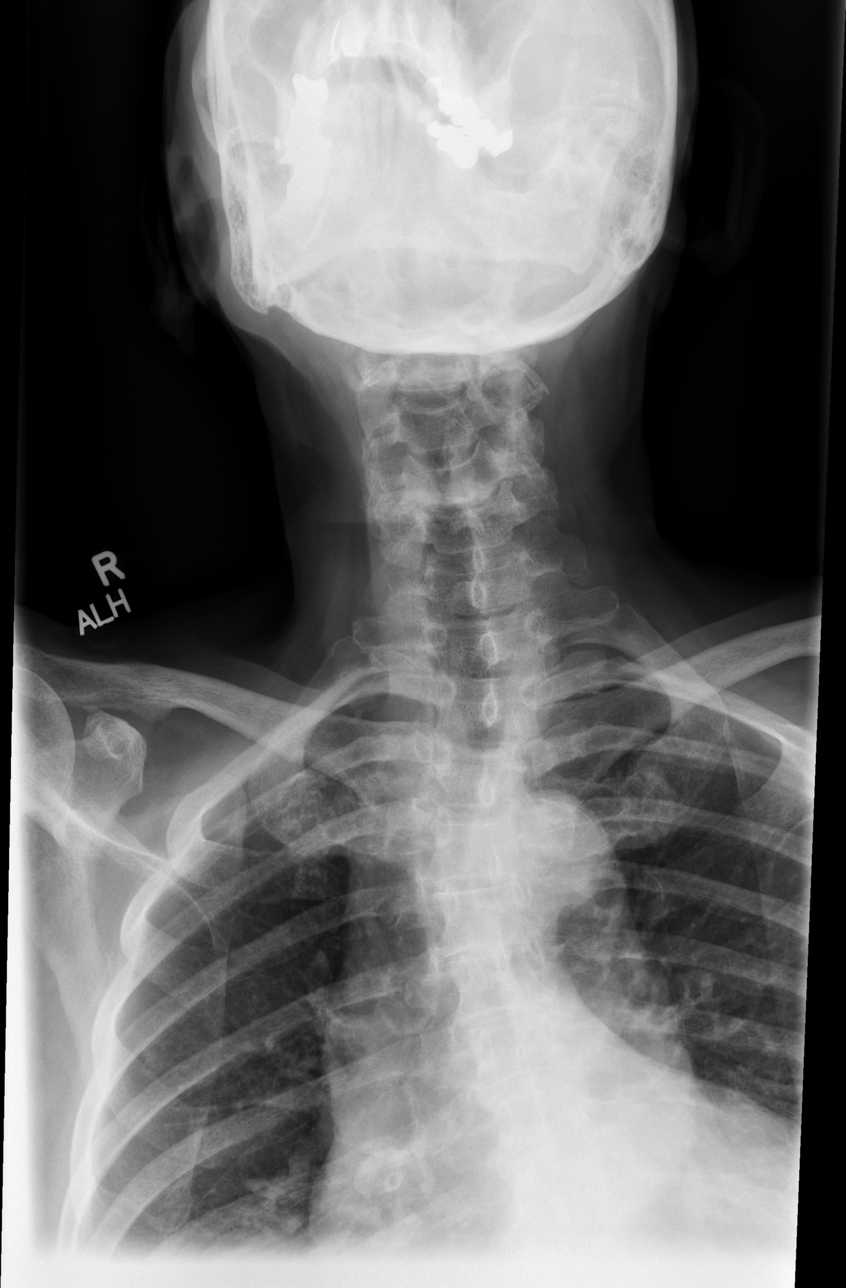

[w cervical spine odontoid]
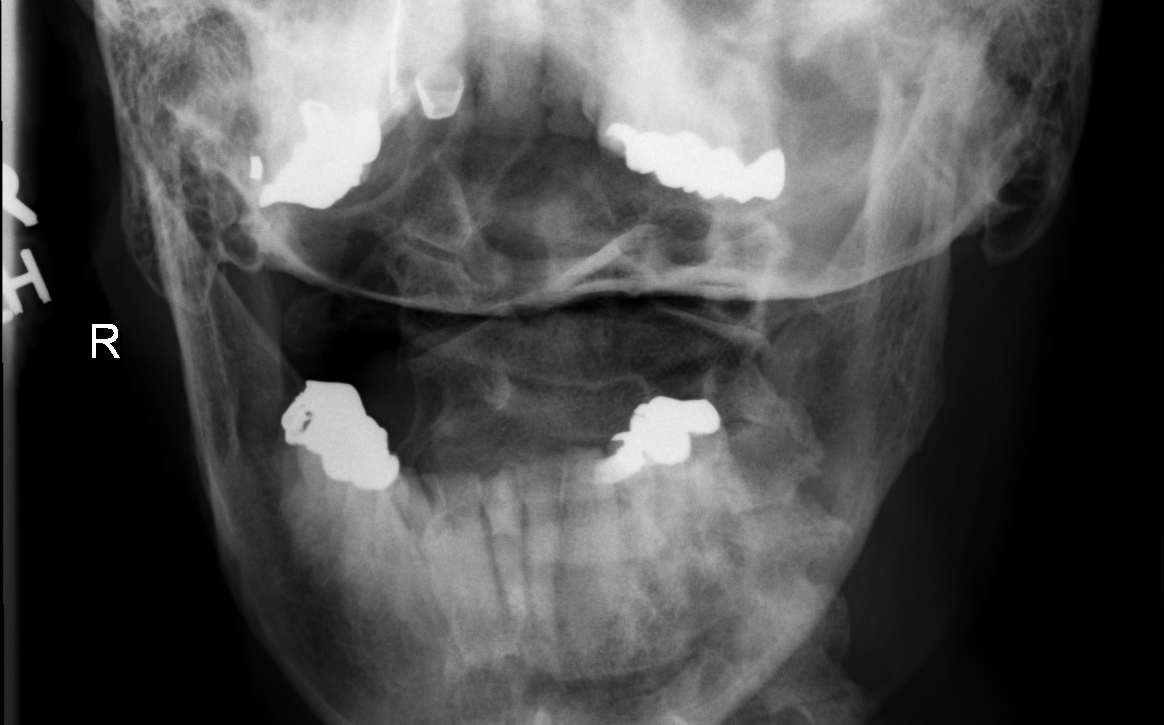

[5 of 5 positions shown; findings below may reference images not displayed]

FINDINGS: Reversal of normal lordosis. No evidence of fracture. Advanced
degenerative disc disease at C5-C6. Facet hypertrophy at L3-L4 on
the left. Lateral masses of C1 are well aligned on C2. Limited
assessment of bony neural foramina. No prevertebral soft tissue
edema.
IMPRESSION: 1. No radiographic evidence of cervical spine fracture. If symptoms
persist and there is persistent clinical concern for fracture,
recommend further evaluation with CT.
2. Reversal of normal lordosis. Advanced C5-C6 degenerative change.
Scattered facet hypertrophy.

## 2021-01-14 IMAGING — CR DG SHOULDER 2+V*L*
3 series · 3 of 3 positions shown · non-contrast
Comparison: None.

CLINICAL DATA: Pain after fall.

EXAM:
LEFT SHOULDER - 2+ VIEW

[w shoulder grashey left]
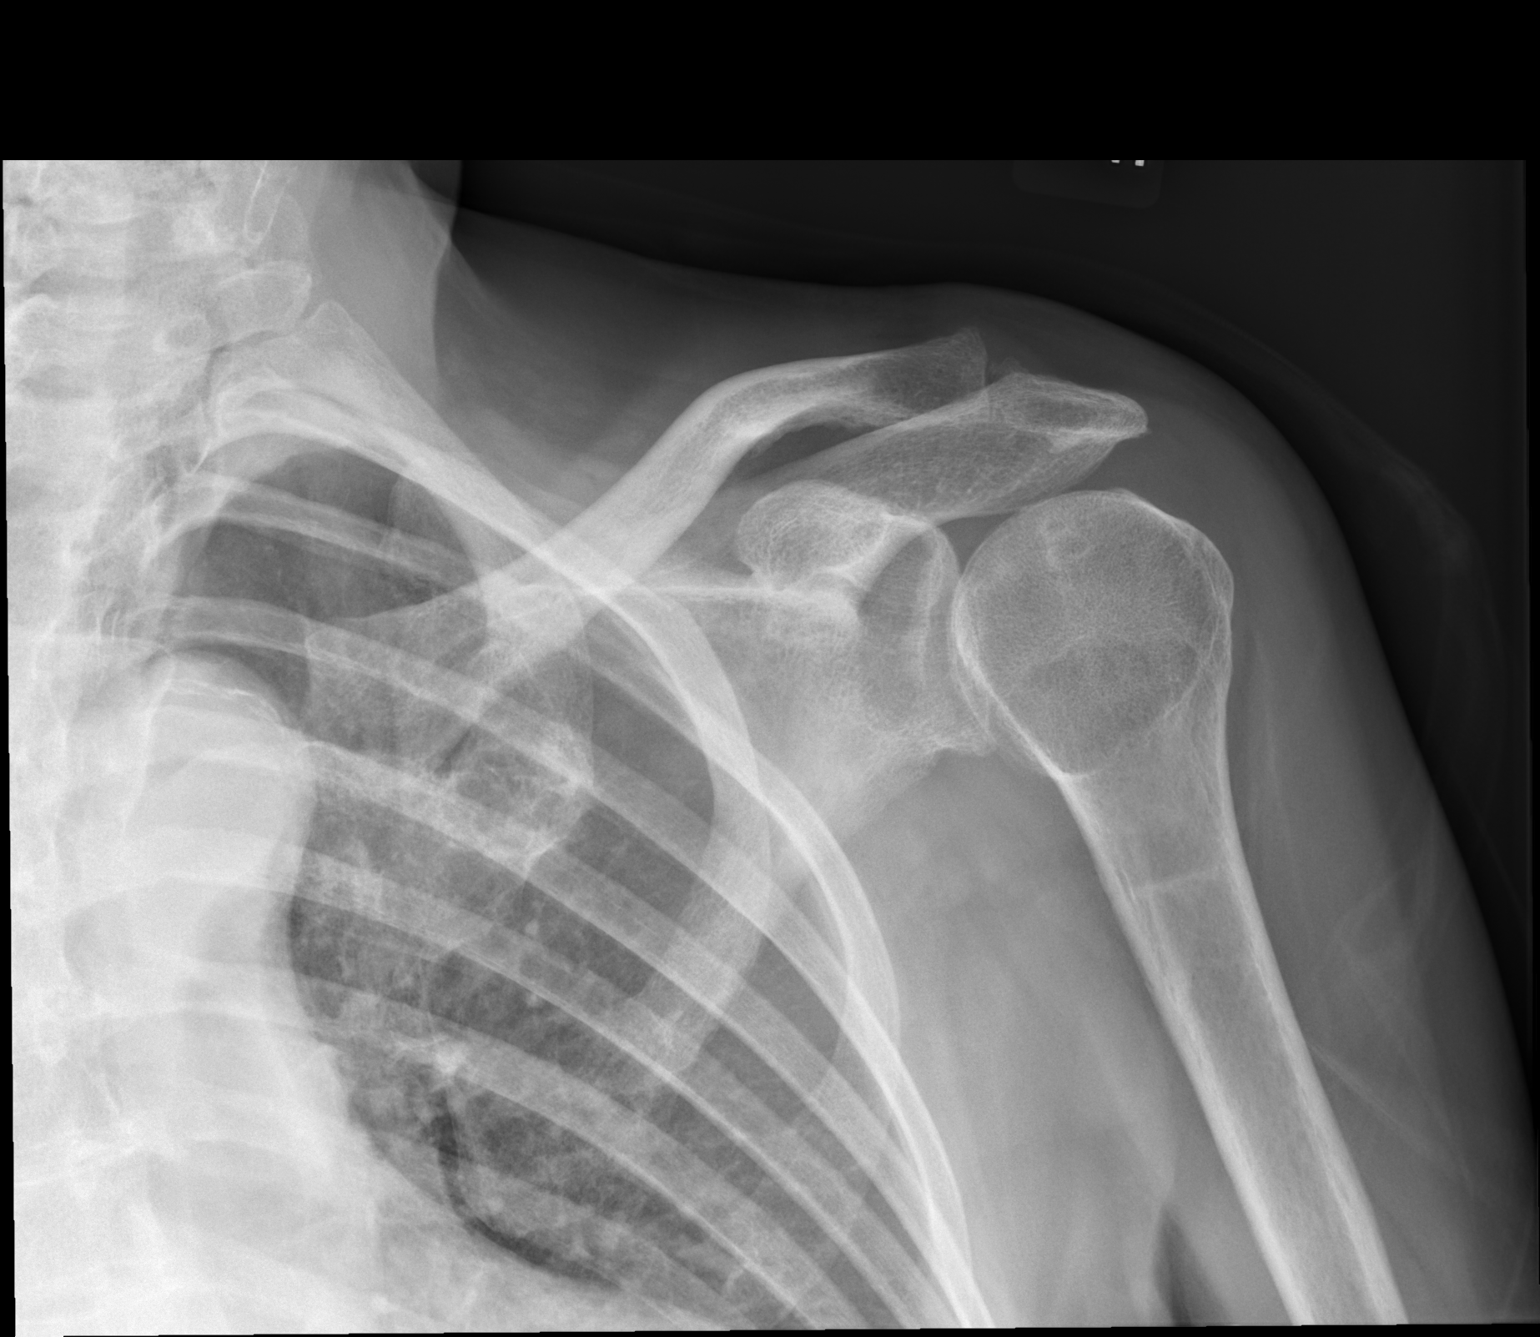

[w shoulder y-view left]
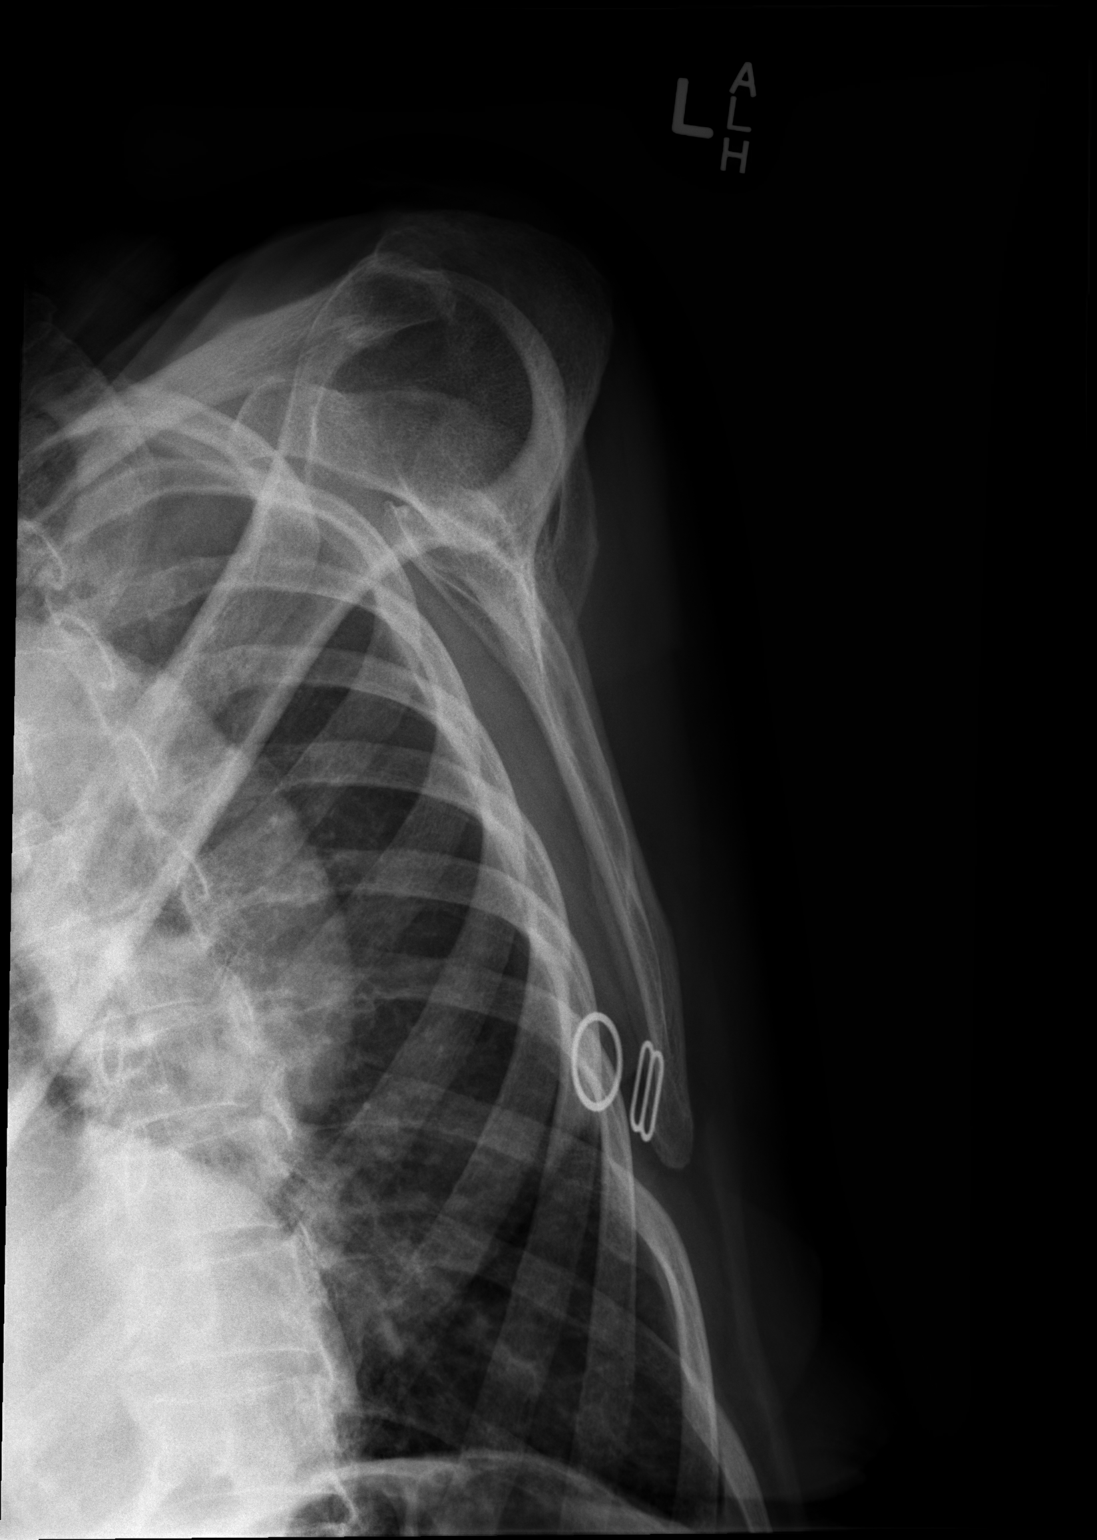

[w shoulder axillary left]
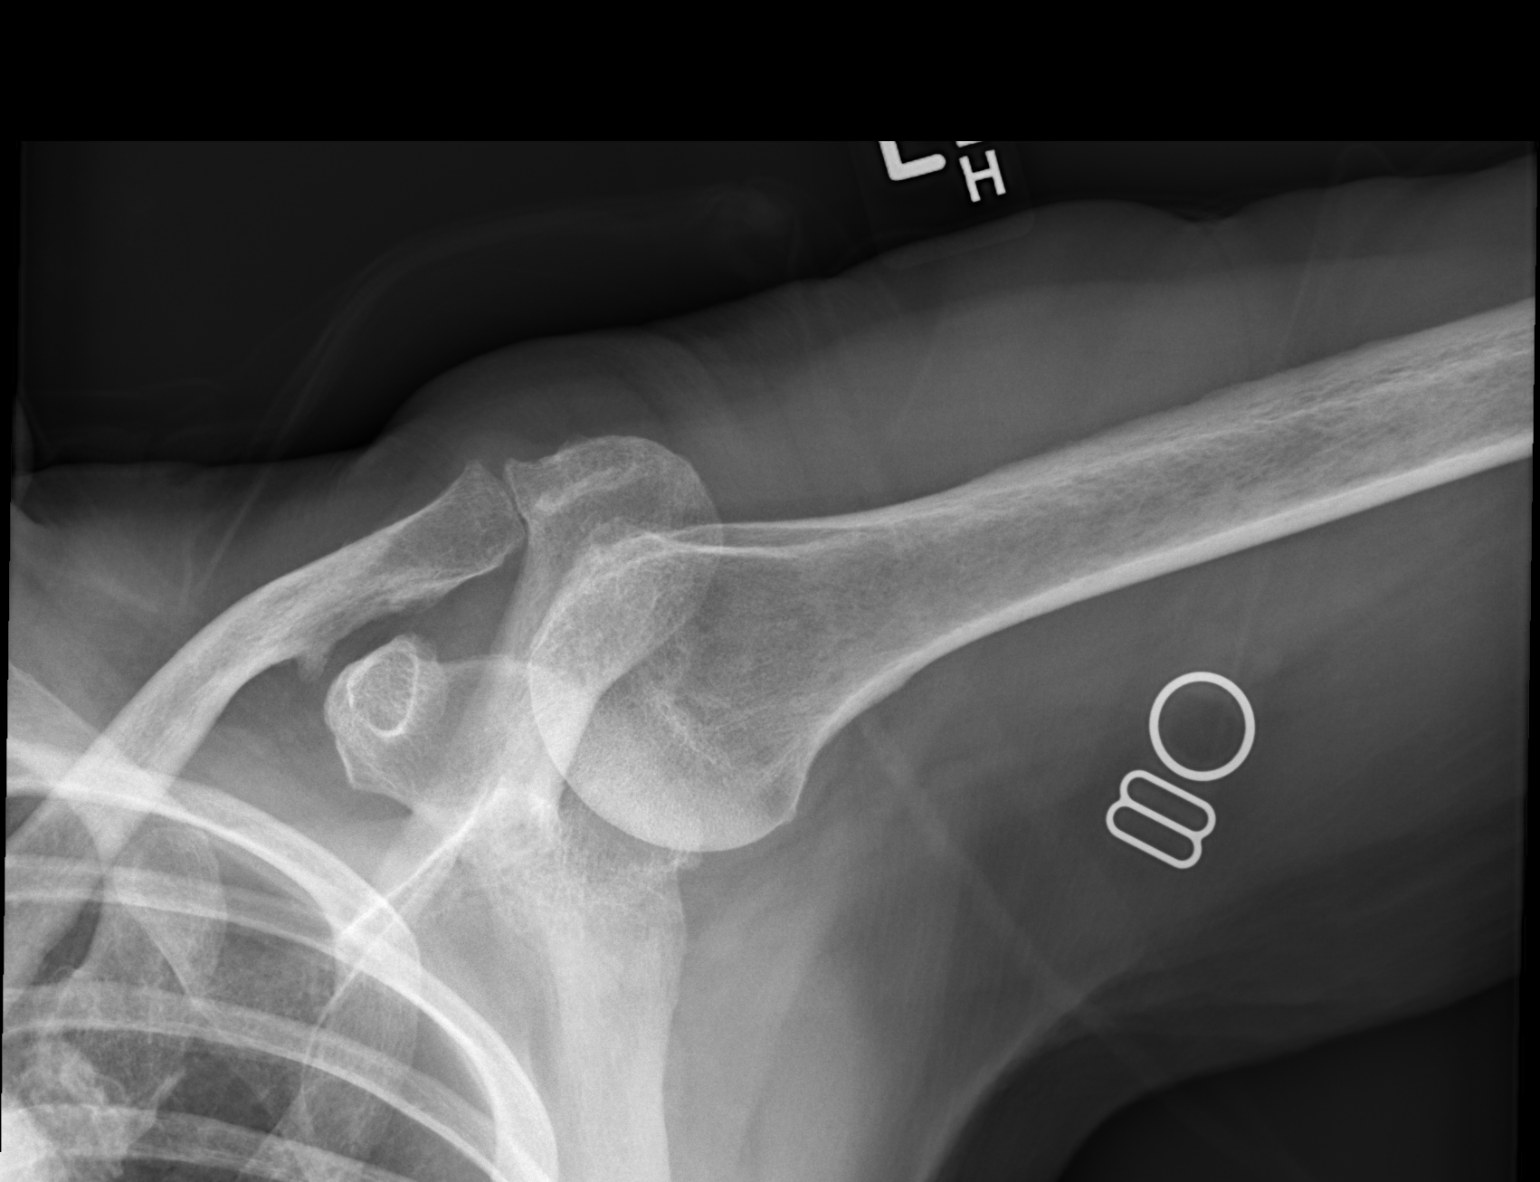

[3 of 3 positions shown; findings below may reference images not displayed]

FINDINGS: There is no evidence of fracture or dislocation. Mild glenohumeral
degenerative change. No evidence of inflammatory arthropathy or
focal lesion. Soft tissues are unremarkable.
IMPRESSION: 1. No fracture or dislocation of the left shoulder.
2. Mild glenohumeral degenerative change.

## 2021-01-14 MED ORDER — METHOCARBAMOL 500 MG PO TABS: 250.0000 mg | ORAL_TABLET | Freq: Three times a day (TID) | ORAL | 0 refills | Status: DC | PRN

## 2021-01-14 NOTE — Patient Instructions (Addendum)
- Please get neck and left shoulder X-ray at [place] at [facility] then will call you with results.  - Take extra Strength Tylenol 500 mg tablet every 8 hours as needed for pain  - Take Robaxin 250 mg tablet one by mouth every 8 hours daily as needed for pain and muscle relaxant   Urinary Tract Infection, Adult A urinary tract infection (UTI) is an infection of any part of the urinary tract. The urinary tract includes:  The kidneys.  The ureters.  The bladder.  The urethra. These organs make, store, and get rid of pee (urine) in the body. What are the causes? This infection is caused by germs (bacteria) in your genital area. These germs grow and cause swelling (inflammation) of your urinary tract. What increases the risk? The following factors may make you more likely to develop this condition:  Using a small, thin tube (catheter) to drain pee.  Not being able to control when you pee or poop (incontinence).  Being female. If you are female, these things can increase the risk: ? Using these methods to prevent pregnancy:  A medicine that kills sperm (spermicide).  A device that blocks sperm (diaphragm). ? Having low levels of a female hormone (estrogen). ? Being pregnant. You are more likely to develop this condition if:  You have genes that add to your risk.  You are sexually active.  You take antibiotic medicines.  You have trouble peeing because of: ? A prostate that is bigger than normal, if you are female. ? A blockage in the part of your body that drains pee from the bladder. ? A kidney stone. ? A nerve condition that affects your bladder. ? Not getting enough to drink. ? Not peeing often enough.  You have other conditions, such as: ? Diabetes. ? A weak disease-fighting system (immune system). ? Sickle cell disease. ? Gout. ? Injury of the spine. What are the signs or symptoms? Symptoms of this condition include:  Needing to pee  right away.  Peeing small amounts often.  Pain or burning when peeing.  Blood in the pee.  Pee that smells bad or not like normal.  Trouble peeing.  Pee that is cloudy.  Fluid coming from the vagina, if you are female.  Pain in the belly or lower back. Other symptoms include:  Vomiting.  Not feeling hungry.  Feeling mixed up (confused). This may be the first symptom in older adults.  Being tired and grouchy (irritable).  A fever.  Watery poop (diarrhea). How is this treated?  Taking antibiotic medicine.  Taking other medicines.  Drinking enough water. In some cases, you may need to see a specialist. Follow these instructions at home: Medicines  Take over-the-counter and prescription medicines only as told by your doctor.  If you were prescribed an antibiotic medicine, take it as told by your doctor. Do not stop taking it even if you start to feel better. General instructions  Make sure you: ? Pee until your bladder is empty. ? Do not hold pee for a long time. ? Empty your bladder after sex. ? Wipe from front to back after peeing or pooping if you are a female. Use each tissue one time when you wipe.  Drink enough fluid to keep your pee pale yellow.  Keep all follow-up visits.   Contact a doctor if:  You do not get better after 1-2 days.  Your symptoms go away and then come back. Get help right  away if:  You have very bad back pain.  You have very bad pain in your lower belly.  You have a fever.  You have chills.  You feeling like you will vomit or you vomit. Summary  A urinary tract infection (UTI) is an infection of any part of the urinary tract.  This condition is caused by germs in your genital area.  There are many risk factors for a UTI.  Treatment includes antibiotic medicines.  Drink enough fluid to keep your pee pale yellow. This information is not intended to replace advice given to you by your health care provider. Make sure  you discuss any questions you have with your health care provider. Document Revised: 05/15/2020 Document Reviewed: 05/15/2020 Elsevier Patient Education  [place].

## 2021-01-14 NOTE — Progress Notes (Signed)
Provider: Laquitha Heslin FNP-C  Lauree Chandler, NP  Patient Care Team: Lauree Chandler, NP as PCP - General (Geriatric Medicine) Jerline Pain, MD as PCP - Cardiology (Cardiology)  Extended Emergency Contact Information Primary Emergency Contact: Su Monks Address: [address]          [place], [place] [address] Johnnette Litter of [place] Work Phone: [phone] Mobile Phone: [phone] Relation: Son Secondary Emergency Contact: Flonnie Overman Address: [address]          [place], [place] [address] Johnnette Litter of [place] Mobile Phone: [phone] Relation: Relative  Code Status:  DNR Goals of care: Advanced Directive information Advanced Directives 01/14/2021  Does Patient Have a Medical Advance Directive? Yes  Type of Advance Directive [place]  Does patient want to make changes to medical advance directive? No - Patient declined  Copy of [place] in Chart? Yes - validated most recent copy scanned in chart (See row information)  Pre-existing out of facility DNR order (yellow form or pink MOST form) -     Chief Complaint  Patient presents with  . Acute Visit    Fall,urine frequency and hallucination     HPI:  Pt is a 87 y.o. female seen today for an acute visit for evaluation of frequent urination,Hallucination and recent fall episode  4 days ago on Monday 01/11/2021.She is here with son.Son brought in written paper about patient's fall and complains as below:   States patient lives in the basement section of the house and family lives upstairs.Family had a loud noise from basement went to check on patient.Patient was observed in her bedroom lying on the floor with legs towards the furniture.suspect might have hit furniture.she complained of pain and stiffness on the neck.Had some bruises on left shoulder.she declined pain medication but started on Ibuprofen on Wednesday.  Does have worsening right shoulder slumping and  shaking due to palsy.No loss of consciousness reported.denies hitting her head.  Has had increased urine frequency and hallucination.she told son last night to confront an imaginary person in the next room. 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  Has skipped his exercise this week due to fall.Has exercises that was given by Physical Therapy few months ago.  She complains of pain on the back of the neck unable to turn head due to pain.Also complains of left shoulder pain when touched.Has a small bruise on shoulder.    Past Medical History:  Diagnosis Date  . Benign essential tremor 07/18/2018  . Bilateral nonexudative age-related macular degeneration 07/18/2018  . Cholestatic hepatitis    2001  . DDD (degenerative disc disease), lumbar 04/23/2018  . Depression   . Dry eyes, bilateral 07/18/2018  . Fatigue   . GERD (gastroesophageal reflux disease)   . Hiatal hernia with GERD 07/29/2011  . Hoarding behavior 04/23/2018  . HTN (hypertension) 07/29/2011  . Hyperlipidemia   . Hypertension   . Hypothyroidism 07/29/2011  . Insomnia   . Late onset Alzheimer's disease with behavioral disturbance ([place]) 04/23/2018  . LBBB (left bundle branch block) 07/29/2011  . Major depressive disorder 07/29/2016  . Memory impairment 12/09/2016  . Memory loss   . Osteoporosis   . Paranoid delusion ([place]) 07/18/2018  . Psychophysiological insomnia 04/23/2018  . Pulmonary HTN ([place]) 03/15/2019   2 d echo 02/28/19 EF 55-60 %, mild to moderate mitral valve regurg, mild tricuspid regurg, severely  elevated estimated right ventricular systolic pressure 85 mm Hg, and mild to moderate aortic valve regurg. No diastolic CHF noted.   . Pure hypercholesterolemia 07/29/2011  . Sciatica   . Senile osteoporosis 04/23/2018   Past Surgical History:  Procedure Laterality Date  . APPENDECTOMY    . CARDIAC CATHETERIZATION    . FACIAL COSMETIC SURGERY     . TONSILLECTOMY    . TOTAL ABDOMINAL HYSTERECTOMY W/ BILATERAL SALPINGOOPHORECTOMY      Allergies  Allergen Reactions  . Garlic   . Lac Bovis   . Lanolin   . Penicillins     ? rash  . Statins     myalgia    Outpatient Encounter Medications as of 01/14/2021  Medication Sig  . Calcium Carbonate-Vitamin D 600-400 MG-UNIT chew tablet Chew 1 tablet by mouth 3 (three) times daily with meals.  . Cholecalciferol (D3 HIGH POTENCY) 2000 units CAPS Take 1 capsule (2,000 Units total) by mouth daily.  Marland Kitchen donepezil (ARICEPT) 10 MG tablet TAKE 1 TABLET(10 MG) BY MOUTH AT BEDTIME  . FLUoxetine (PROZAC) 20 MG tablet Take 1 tablet (20 mg total) by mouth daily.  . furosemide (LASIX) 40 MG tablet TAKE 1 TABLET(40 MG) BY MOUTH DAILY  . irbesartan (AVAPRO) 300 MG tablet TAKE 1 TABLET(300 MG) BY MOUTH DAILY  . levothyroxine (SYNTHROID) 25 MCG tablet TAKE 1 TABLET(25 MCG) BY MOUTH AT BEDTIME  . Melatonin 10 MG TABS Take 1 tablet by mouth at bedtime.  . methocarbamol (ROBAXIN) 500 MG tablet Take 0.5 tablets (250 mg total) by mouth every 8 (eight) hours as needed for muscle spasms.  . Multiple Vitamins-Minerals (PRESERVISION AREDS 2) CAPS Take 1 capsule by mouth 2 (two) times daily.  Marland Kitchen omeprazole (PRILOSEC) 20 MG capsule TAKE 1 CAPSULE(20 MG) BY MOUTH DAILY  . potassium chloride SA (KLOR-CON) 20 MEQ tablet Take 1 tablet (20 mEq total) by mouth daily.  . propranolol ER (INDERAL LA) 60 MG 24 hr capsule Take 1 capsule (60 mg total) by mouth daily.  . traZODone (DESYREL) 50 MG tablet TAKE 1 TABLET(50 MG) BY MOUTH AT BEDTIME  . vitamin C (ASCORBIC ACID) 500 MG tablet Take 1 tablet (500 mg total) by mouth daily.   No facility-administered encounter medications on file as of 01/14/2021.    Review of Systems  Unable to perform ROS: Other (Most HPI provided by patient son )  Constitutional: Negative for appetite change, chills, fatigue and fever.  HENT: Positive for hearing loss. Negative for congestion,  rhinorrhea, sinus pressure, sinus pain, sneezing and sore throat.   Eyes: Negative for pain, discharge, redness and itching.  Respiratory: Negative for cough, chest tightness, shortness of breath and wheezing.   Cardiovascular: Negative for chest pain, palpitations and leg swelling.  Gastrointestinal: Negative for abdominal distention, abdominal pain, constipation, diarrhea, nausea and vomiting.  Endocrine: Negative for cold intolerance, heat intolerance, polydipsia, polyphagia and polyuria.  Genitourinary: Positive for difficulty urinating and frequency. Negative for decreased urine volume, dysuria, flank pain, urgency and vaginal bleeding.  Musculoskeletal: Positive for arthralgias, gait problem and neck pain. Negative for neck stiffness.  Skin: Negative for color change, pallor and rash.  Neurological: Positive for tremors. Negative for dizziness, speech difficulty, weakness, light-headedness, numbness and headaches.       Chronic Tremors on right hand more violet at times and sometimes calm.  Hematological: Does not bruise/bleed easily.  Psychiatric/Behavioral: Positive for confusion. Negative for agitation, behavioral problems and sleep disturbance. The patient is not nervous/anxious.  Memory loss     Immunization History  Administered Date(s) Administered  . Influenza Split 08/14/2009, 06/29/2010, 07/19/2011, 07/29/2016  . Influenza, High Dose Seasonal PF 08/08/2012, 07/26/2013, 09/17/2014, 07/31/2015, 08/15/2019  . Influenza, Quadrivalent, Recombinant, Inj, Pf 07/19/2017  . Influenza,inj,Quad PF,6+ Mos 08/16/2018  . Influenza,trivalent, recombinat, inj, PF 07/26/2016  . Moderna Sars-Covid-2 Vaccination 10/22/2019, 11/19/2019  . Pneumococcal Conjugate-13 08/21/2009  . Pneumococcal Polysaccharide-23 04/20/2018  . Td 10/13/1997  . Tdap 08/31/2006  . Zoster 06/15/2011  . Zoster Recombinat (Shingrix) 05/25/2018, 08/19/2018   Pertinent  Health Maintenance Due  Topic Date Due   . INFLUENZA VACCINE  05/17/2020  . DEXA SCAN  Completed  . PNA vac Low Risk Adult  Completed   Fall Risk  01/14/2021 11/06/2020 08/11/2020 08/03/2020 01/27/2020  Falls in the past year? $RemoveBe'1 1 1 1 1  'frJYpKdKG$ Number falls in past yr: $Remove'1 1 1 1 'VsCqEaw$ 0  Comment - - - 5 falls since april 1 last week -  Injury with Fall? 1 0 1 1 0  Comment - - - bruising on the arm -   Functional Status Survey:    Vitals:   01/14/21 1123  BP: 120/64  Pulse: (!) 48  Resp: 16  Temp: (!) 97.1 F (36.2 C)  SpO2: 97%  Weight: 141 lb 9.6 oz (64.2 kg)  Height: $Remove'5\' 9"'AaQcdxO$  (1.753 m)   Body mass index is 20.91 kg/m. Physical Exam Vitals reviewed.  Constitutional:      General: She is not in acute distress.    Appearance: She is normal weight. She is not ill-appearing.  HENT:     Head: Normocephalic.     Nose: Nose normal. No congestion or rhinorrhea.     Mouth/Throat:     Mouth: Mucous membranes are moist.     Pharynx: Oropharynx is clear. No oropharyngeal exudate or posterior oropharyngeal erythema.  Eyes:     General: No scleral icterus.       Right eye: No discharge.        Left eye: No discharge.     Conjunctiva/sclera: Conjunctivae normal.     Pupils: Pupils are equal, round, and reactive to light.  Neck:   Cardiovascular:     Rate and Rhythm: Normal rate. Rhythm irregular.     Pulses: Normal pulses.     Heart sounds: Murmur heard.  No friction rub. No gallop.   Pulmonary:     Effort: Pulmonary effort is normal. No respiratory distress.     Breath sounds: Normal breath sounds. No wheezing, rhonchi or rales.  Chest:     Chest wall: No tenderness.  Abdominal:     General: Bowel sounds are normal. There is no distension.     Palpations: Abdomen is soft. There is no mass.     Tenderness: There is no abdominal tenderness. There is no right CVA tenderness, left CVA tenderness, guarding or rebound.  Musculoskeletal:     Right shoulder: Normal.     Left shoulder: Tenderness present. No swelling, effusion or  crepitus. Decreased range of motion. Normal strength. Normal pulse.     Cervical back: No edema, erythema, rigidity or crepitus. Pain with movement and muscular tenderness present. No spinous process tenderness. Decreased range of motion.     Right lower leg: No edema.     Left lower leg: No edema.  Lymphadenopathy:     Cervical: No cervical adenopathy.  Skin:    General: Skin is warm and dry.     Coloration: Skin is not  pale.     Findings: No erythema or rash.       Neurological:     Mental Status: She is alert.     Cranial Nerves: No cranial nerve deficit.     Gait: Gait abnormal.     Comments: Generalized weakness   Psychiatric:        Mood and Affect: Mood normal.        Speech: Speech normal.        Behavior: Behavior normal.        Cognition and Memory: Memory is impaired.     Labs reviewed: Recent Labs    08/03/20 1623  NA 137  K 4.0  CL 100  CO2 27  GLUCOSE 139  BUN 19  CREATININE 1.36*  CALCIUM 9.1   Recent Labs    08/03/20 1623  AST 21  ALT 22  BILITOT 0.4  PROT 6.2   Recent Labs    08/03/20 1623  WBC 7.2  NEUTROABS 5,328  HGB 12.5  HCT 37.1  MCV 94.2  PLT 174   Lab Results  Component Value Date   TSH 2.26 08/03/2020   No results found for: HGBA1C Lab Results  Component Value Date   CHOL 244 (A) 04/19/2018   HDL 54 04/19/2018   LDLCALC 168 04/19/2018   TRIG 110 04/19/2018    Significant Diagnostic Results in last 30 days:  No results found.  Assessment/Plan 1. Frequent urination Afebrile. - POC Urinalysis Dipstick indicates yellow cloudy urine with moderate leukocytes but negative for nitrites.results discuss with son and patient will send urine for culture.advised to Notify provider if running any fever or chills or  Worsening symptoms .Made aware culture will return in 3 days. - Culture, Urine  2. Neck pain Status post fall possible hit furniture . - Advised to take extra strength tylenol 500 mg tablet every 8 hrs  - Robaxin  as below for muscle relaxant.side effects discussed may use at bedtime if drowsy during the day.  - DG Cervical Spine Complete; Future - DG Shoulder Left; Future - methocarbamol (ROBAXIN) 500 MG tablet; Take 0.5 tablets (250 mg total) by mouth every 8 (eight) hours as needed for muscle spasms.  Dispense: 30 tablet; Refill: 0  3. Fall in home, initial encounter Unwitnessed fall  01/11/2021 in her bedroom on carpet floor denies hitting head family had a louder noise and went down to the basement found her on the floor. No loss of consciousness. - DG Cervical Spine Complete; Future - CBC with Differential/Platelet - BMP with eGFR([facility]) - DG Shoulder Left; Future  4. Hallucination  worsen last night.possible urinary tract infection.will obtain labs to rule out other acute  and metabolic etiologies - CBC with Differential/Platelet - BMP with eGFR([facility])  5. Acute pain of left shoulder Pain with palpation   6. Abrasion of left shoulder, initial encounter Small abrasion on left shoulder no signs of infection or bleeding.    Family/ staff Communication: Reviewed plan of care with patient and son verbalized understanding.   Labs/tests ordered:   - CBC with Differential/Platelet - BMP with eGFR([facility])  Next Appointment: As needed if symptoms worsen   Sandrea Hughs, NP

## 2021-01-15 ENCOUNTER — Other Ambulatory Visit: Payer: Self-pay | Admitting: Internal Medicine

## 2021-01-15 LAB — CBC WITH DIFFERENTIAL/PLATELET
Absolute Monocytes: 804 cells/uL (ref 200–950)
Basophils Absolute: 29 cells/uL (ref 0–200)
Basophils Relative: 0.3 %
Eosinophils Absolute: 78 cells/uL (ref 15–500)
Eosinophils Relative: 0.8 %
HCT: 39.9 % (ref 35.0–45.0)
Hemoglobin: 13.3 g/dL (ref 11.7–15.5)
Lymphs Abs: 1303 cells/uL (ref 850–3900)
MCH: 31.6 pg (ref 27.0–33.0)
MCHC: 33.3 g/dL (ref 32.0–36.0)
MCV: 94.8 fL (ref 80.0–100.0)
MPV: 10.3 fL (ref 7.5–12.5)
Monocytes Relative: 8.2 %
Neutro Abs: 7585 cells/uL (ref 1500–7800)
Neutrophils Relative %: 77.4 %
Platelets: 169 10*3/uL (ref 140–400)
RBC: 4.21 10*6/uL (ref 3.80–5.10)
RDW: 12.6 % (ref 11.0–15.0)
Total Lymphocyte: 13.3 %
WBC: 9.8 10*3/uL (ref 3.8–10.8)

## 2021-01-15 LAB — BASIC METABOLIC PANEL WITH GFR
BUN/Creatinine Ratio: 22 (calc) (ref 6–22)
BUN: 28 mg/dL — ABNORMAL HIGH (ref 7–25)
CO2: 29 mmol/L (ref 20–32)
Calcium: 9.5 mg/dL (ref 8.6–10.4)
Chloride: 99 mmol/L (ref 98–110)
Creat: 1.3 mg/dL — ABNORMAL HIGH (ref 0.60–0.88)
GFR, Est African American: 41 mL/min/{1.73_m2} — ABNORMAL LOW (ref >=60)
GFR, Est Non African American: 35 mL/min/{1.73_m2} — ABNORMAL LOW (ref >=60)
Glucose, Bld: 71 mg/dL (ref 65–139)
Potassium: 4.4 mmol/L (ref 3.5–5.3)
Sodium: 138 mmol/L (ref 135–146)

## 2021-01-16 LAB — URINE CULTURE
MICRO NUMBER:: 11720747
Result:: NO GROWTH
SPECIMEN QUALITY:: ADEQUATE

## 2021-01-18 ENCOUNTER — Telehealth: Payer: Self-pay | Admitting: Nurse Practitioner

## 2021-01-18 ENCOUNTER — Other Ambulatory Visit: Payer: Self-pay | Admitting: Internal Medicine

## 2021-01-18 DIAGNOSIS — M542 Cervicalgia: Secondary | ICD-10-CM

## 2021-01-18 DIAGNOSIS — M25512 Pain in left shoulder: Secondary | ICD-10-CM

## 2021-01-18 NOTE — Telephone Encounter (Signed)
I would recommend discussing this with the pharmacy to see if they help with all the different pick up times, will evaluate medications at next OV

## 2021-01-18 NOTE — Telephone Encounter (Signed)
Patients son called and wanted to drop the referral for ortho. He wanted to know if they could get in today or asap. I told him we would do the best we could.

## 2021-01-18 NOTE — Telephone Encounter (Signed)
Orthopedic referral ordered  

## 2021-01-18 NOTE — Telephone Encounter (Signed)
Pt son(Tony) called to express his frustration that his mom is on approximately 14 different meds, when pharmacy calls OR they go to pick up refills; its 1 or 2 bottles at a time.  Can it not be worked out to where all of these can be picked up OR called in together so they are not making several trips to the pharmacy?  Please advise  April Stevenson

## 2021-01-18 NOTE — Addendum Note (Signed)
Addended byMarlowe Sax C on: 01/18/2021 12:52 PM   Modules accepted: Orders

## 2021-01-18 NOTE — Telephone Encounter (Signed)
I discussed April Stevenson's response with son, [name], he verbalized his understanding.

## 2021-01-19 DIAGNOSIS — H353231 Exudative age-related macular degeneration, bilateral, with active choroidal neovascularization: Secondary | ICD-10-CM | POA: Diagnosis not present

## 2021-01-21 ENCOUNTER — Encounter: Payer: Self-pay | Admitting: Family Medicine

## 2021-01-21 ENCOUNTER — Other Ambulatory Visit: Payer: Self-pay

## 2021-01-21 ENCOUNTER — Ambulatory Visit (INDEPENDENT_AMBULATORY_CARE_PROVIDER_SITE_OTHER): Payer: Medicare Other | Admitting: Family Medicine

## 2021-01-21 DIAGNOSIS — M542 Cervicalgia: Secondary | ICD-10-CM

## 2021-01-21 MED ORDER — DICLOFENAC SODIUM 1 % EX GEL: 4.0000 g | Freq: Four times a day (QID) | CUTANEOUS | 6 refills | Status: DC | PRN

## 2021-01-21 NOTE — Progress Notes (Signed)
Office Visit Note   Patient: April Stevenson           Date of Birth: [birth year]           MRN: [id number] Visit Date: 01/21/2021 Requested by: Sandrea Hughs, NP [address] [place],  [place] [address] PCP: Lauree Chandler, NP  Subjective: Chief Complaint  Patient presents with  . Neck - Pain    Fell 2 weeks ago in her room, after tripping on carpeting. She fell backward, onto her back. Unsure if her shoulder hit anything. Pain in the posterior neck. Can raise both arms, but it hurts more to raise the left arm. Has some tingling in the hands, but does not recall if she had this prior to the fall. Taking muscle relaxers and Tylenol.  . Left Shoulder - Pain    HPI: She is here with left-sided neck pain.  About 2 weeks ago she tripped and fell backward, hitting her head against something.  She did not lose consciousness.  She had immediate pain in her neck with occasional pain into her left shoulder and arm.  She went to her PCP, x-rays were negative for fracture but positive for cervical spondylosis.  She was given a muscle relaxant but has not noticed much relief.  Most of the time her pain is 6/10.                ROS:   All other systems were reviewed and are negative.  Objective: Vital Signs: There were no vitals taken for this visit.  Physical Exam:  General:  Alert and oriented, in no acute distress. Pulm:  Breathing unlabored. Psy:  Normal mood, congruent affect. Skin: No rash or bruising Neck: She has pain with rotation.  She is tender to palpation to the left of midline at the C2-3 level where she has a symptomatic trigger point.  Upper extremity strength remains normal.    Imaging: No results found.  Assessment & Plan: 1.  Left-sided neck pain, possibly myofascial.  She has underlying spondylosis.  Cannot rule out occult fracture or disc protrusion. -We will try physical therapy at [facility] in [place].  Voltaren gel topically as needed.  If she  fails to improve, then MRI scan followed by facet injections if indicated.     Procedures: No procedures performed        PMFS History: Patient Active Problem List   Diagnosis Date Noted  . Chronic venous insufficiency 09/19/2019  . Pulmonary HTN ([place]) 03/15/2019  . Dry eyes, bilateral 07/18/2018  . Bilateral nonexudative age-related macular degeneration 07/18/2018  . Paranoid delusion ([place]) 07/18/2018  . Benign essential tremor 07/18/2018  . Late onset Alzheimer's disease with behavioral disturbance ([place]) 04/23/2018  . Senile osteoporosis 04/23/2018  . Hoarding behavior 04/23/2018  . Psychophysiological insomnia 04/23/2018  . DDD (degenerative disc disease), lumbar 04/23/2018  . Major depressive disorder 07/29/2016  . Hiatal hernia with GERD 07/29/2011  . Essential hypertension 07/29/2011  . Hypothyroidism 07/29/2011  . LBBB (left bundle branch block) 07/29/2011  . Pure hypercholesterolemia 07/29/2011   Past Medical History:  Diagnosis Date  . Benign essential tremor 07/18/2018  . Bilateral nonexudative age-related macular degeneration 07/18/2018  . Cholestatic hepatitis    2001  . DDD (degenerative disc disease), lumbar 04/23/2018  . Depression   . Dry eyes, bilateral 07/18/2018  . Fatigue   . GERD (gastroesophageal reflux disease)   . Hiatal hernia with GERD 07/29/2011  . Hoarding behavior 04/23/2018  .  HTN (hypertension) 07/29/2011  . Hyperlipidemia   . Hypertension   . Hypothyroidism 07/29/2011  . Insomnia   . Late onset Alzheimer's disease with behavioral disturbance ([place]) 04/23/2018  . LBBB (left bundle branch block) 07/29/2011  . Major depressive disorder 07/29/2016  . Memory impairment 12/09/2016  . Memory loss   . Osteoporosis   . Paranoid delusion ([place]) 07/18/2018  . Psychophysiological insomnia 04/23/2018  . Pulmonary HTN ([place]) 03/15/2019   2 d echo 02/28/19 EF 55-60 %, mild to moderate mitral valve regurg, mild tricuspid regurg, severely elevated estimated  right ventricular systolic pressure 85 mm Hg, and mild to moderate aortic valve regurg. No diastolic CHF noted.   . Pure hypercholesterolemia 07/29/2011  . Sciatica   . Senile osteoporosis 04/23/2018    Family History  Problem Relation Age of Onset  . Heart attack Father     Past Surgical History:  Procedure Laterality Date  . APPENDECTOMY    . CARDIAC CATHETERIZATION    . FACIAL COSMETIC SURGERY    . TONSILLECTOMY    . TOTAL ABDOMINAL HYSTERECTOMY W/ BILATERAL SALPINGOOPHORECTOMY     Social History   Occupational History  . Occupation: model    Comment: Location manager  . Occupation: singer  Tobacco Use  . Smoking status: Former Smoker    Packs/day: 0.50    Years: 20.00    Pack years: 10.00    Types: Cigarettes  . Smokeless tobacco: Never Used  Vaping Use  . Vaping Use: Never used  Substance and Sexual Activity  . Alcohol use: Yes    Alcohol/week: 1.0 standard drink    Types: 1 Glasses of wine per week    Comment: 5-6 times a month.  . Drug use: Never  . Sexual activity: Not Currently    Partners: Male

## 2021-01-22 ENCOUNTER — Ambulatory Visit: Payer: Medicare Other | Admitting: Nurse Practitioner

## 2021-01-25 ENCOUNTER — Other Ambulatory Visit: Payer: Self-pay | Admitting: Internal Medicine

## 2021-01-25 DIAGNOSIS — I872 Venous insufficiency (chronic) (peripheral): Secondary | ICD-10-CM

## 2021-01-25 NOTE — Telephone Encounter (Signed)
Pharmacy requested refills Pended Rx's and sent to [facility] for approval due to [place].

## 2021-01-27 DIAGNOSIS — G8911 Acute pain due to trauma: Secondary | ICD-10-CM | POA: Diagnosis not present

## 2021-01-27 DIAGNOSIS — M542 Cervicalgia: Secondary | ICD-10-CM | POA: Diagnosis not present

## 2021-01-27 DIAGNOSIS — M62830 Muscle spasm of back: Secondary | ICD-10-CM | POA: Diagnosis not present

## 2021-01-28 ENCOUNTER — Other Ambulatory Visit: Payer: Self-pay | Admitting: Internal Medicine

## 2021-01-28 DIAGNOSIS — G250 Essential tremor: Secondary | ICD-10-CM

## 2021-01-28 DIAGNOSIS — F3341 Major depressive disorder, recurrent, in partial remission: Secondary | ICD-10-CM

## 2021-01-28 NOTE — Telephone Encounter (Signed)
High risk or very high risk warning populated when attempting to refill medication. RX request sent to PCP for review and approval if warranted.   

## 2021-01-29 DIAGNOSIS — M62830 Muscle spasm of back: Secondary | ICD-10-CM | POA: Diagnosis not present

## 2021-01-29 DIAGNOSIS — M542 Cervicalgia: Secondary | ICD-10-CM | POA: Diagnosis not present

## 2021-01-29 DIAGNOSIS — G8911 Acute pain due to trauma: Secondary | ICD-10-CM | POA: Diagnosis not present

## 2021-02-01 ENCOUNTER — Ambulatory Visit: Payer: Medicare Other | Admitting: Internal Medicine

## 2021-02-01 DIAGNOSIS — M542 Cervicalgia: Secondary | ICD-10-CM | POA: Diagnosis not present

## 2021-02-01 DIAGNOSIS — G8911 Acute pain due to trauma: Secondary | ICD-10-CM | POA: Diagnosis not present

## 2021-02-01 DIAGNOSIS — M62830 Muscle spasm of back: Secondary | ICD-10-CM | POA: Diagnosis not present

## 2021-02-02 ENCOUNTER — Other Ambulatory Visit: Payer: Self-pay | Admitting: Internal Medicine

## 2021-02-02 DIAGNOSIS — F3341 Major depressive disorder, recurrent, in partial remission: Secondary | ICD-10-CM

## 2021-02-03 ENCOUNTER — Other Ambulatory Visit: Payer: Self-pay | Admitting: *Deleted

## 2021-02-03 DIAGNOSIS — G8911 Acute pain due to trauma: Secondary | ICD-10-CM | POA: Diagnosis not present

## 2021-02-03 DIAGNOSIS — G250 Essential tremor: Secondary | ICD-10-CM

## 2021-02-03 DIAGNOSIS — M62830 Muscle spasm of back: Secondary | ICD-10-CM | POA: Diagnosis not present

## 2021-02-03 DIAGNOSIS — M542 Cervicalgia: Secondary | ICD-10-CM | POA: Diagnosis not present

## 2021-02-03 MED ORDER — PROPRANOLOL HCL ER 60 MG PO CP24: 60.0000 mg | ORAL_CAPSULE | Freq: Every day | ORAL | 1 refills | Status: DC

## 2021-02-03 NOTE — Telephone Encounter (Signed)
Pharmacy requested refill

## 2021-02-03 NOTE — Telephone Encounter (Signed)
High risk or very high risk warning populated when attempting to refill medication. RX request sent to PCP for review and approval if warranted.   

## 2021-02-04 ENCOUNTER — Telehealth: Payer: Self-pay | Admitting: Family

## 2021-02-04 NOTE — Telephone Encounter (Signed)
FL2 form completed placed on to be Faxed box

## 2021-02-05 NOTE — Telephone Encounter (Signed)
Noted  

## 2021-02-09 DIAGNOSIS — G8911 Acute pain due to trauma: Secondary | ICD-10-CM | POA: Diagnosis not present

## 2021-02-09 DIAGNOSIS — M62830 Muscle spasm of back: Secondary | ICD-10-CM | POA: Diagnosis not present

## 2021-02-09 DIAGNOSIS — M542 Cervicalgia: Secondary | ICD-10-CM | POA: Diagnosis not present

## 2021-02-16 DIAGNOSIS — H353231 Exudative age-related macular degeneration, bilateral, with active choroidal neovascularization: Secondary | ICD-10-CM | POA: Diagnosis not present

## 2021-02-17 ENCOUNTER — Encounter: Payer: Self-pay | Admitting: Podiatry

## 2021-02-17 ENCOUNTER — Other Ambulatory Visit: Payer: Self-pay

## 2021-02-17 ENCOUNTER — Ambulatory Visit (INDEPENDENT_AMBULATORY_CARE_PROVIDER_SITE_OTHER): Payer: Medicare Other | Admitting: Podiatry

## 2021-02-17 DIAGNOSIS — M79676 Pain in unspecified toe(s): Secondary | ICD-10-CM

## 2021-02-17 DIAGNOSIS — B351 Tinea unguium: Secondary | ICD-10-CM | POA: Diagnosis not present

## 2021-02-18 DIAGNOSIS — G8911 Acute pain due to trauma: Secondary | ICD-10-CM | POA: Diagnosis not present

## 2021-02-18 DIAGNOSIS — M62830 Muscle spasm of back: Secondary | ICD-10-CM | POA: Diagnosis not present

## 2021-02-18 DIAGNOSIS — M542 Cervicalgia: Secondary | ICD-10-CM | POA: Diagnosis not present

## 2021-02-23 DIAGNOSIS — M62830 Muscle spasm of back: Secondary | ICD-10-CM | POA: Diagnosis not present

## 2021-02-23 DIAGNOSIS — M542 Cervicalgia: Secondary | ICD-10-CM | POA: Diagnosis not present

## 2021-02-23 DIAGNOSIS — G8911 Acute pain due to trauma: Secondary | ICD-10-CM | POA: Diagnosis not present

## 2021-02-25 NOTE — Progress Notes (Signed)
  Subjective:  Patient ID: April Stevenson, female    DOB: [birth year],  MRN: [id number]  Blair presents to clinic today for painful thick toenails that are difficult to trim. Pain interferes with ambulation. Aggravating factors include wearing enclosed shoe gear. Pain is relieved with periodic professional debridement.   Her son is present during today's visit. He states she recently had retinal injections for macular degeneration.  PCP is Advice worker and last visit was 01/14/2021.  Allergies  Allergen Reactions  . Garlic   . Lac Bovis   . Lanolin   . Penicillins     ? rash  . Statins     myalgia    Review of Systems: Negative except as noted in the HPI. Objective:   Constitutional April Stevenson is a pleasant 87 y.o. Caucasian female, WD, WN in NAD. AAO x 3.   Vascular Capillary refill time to digits immediate b/l. Palpable pedal pulses b/l LE. Pedal hair sparse. Lower extremity skin temperature gradient within normal limits. No cyanosis or clubbing noted.  Neurologic Normal speech. Oriented to person, place, and time. Protective sensation intact 5/5 intact bilaterally with 10g monofilament b/l.  Dermatologic Pedal skin with normal turgor, texture and tone bilaterally. No open wounds bilaterally. No interdigital macerations bilaterally. Toenails 1-5 b/l elongated, discolored, dystrophic, thickened, crumbly with subungual debris and tenderness to dorsal palpation.  Orthopedic: Normal muscle strength 5/5 to all lower extremity muscle groups bilaterally. No pain crepitus or joint limitation noted with ROM b/l. No gross bony deformities bilaterally.   Radiographs: None Assessment:   1. Pain due to onychomycosis of toenail    Plan:  Patient was evaluated and treated and all questions answered.  Onychomycosis with pain -Nails palliatively debridement as below -Educated on self-care  Procedure: Nail Debridement Rationale: Pain Type of  Debridement: manual, sharp debridement. Instrumentation: Nail nipper, rotary burr. Number of Nails: 10 -Examined patient. -Patient to continue soft, supportive shoe gear daily. -Toenails 1-5 b/l were debrided in length and girth with sterile nail nippers and dremel without iatrogenic bleeding.  -Patient to report any pedal injuries to medical professional immediately. -Patient/POA to call should there be question/concern in the interim.  Return in about 3 months (around 05/20/2021).  Marzetta Board, DPM

## 2021-04-09 DIAGNOSIS — H353231 Exudative age-related macular degeneration, bilateral, with active choroidal neovascularization: Secondary | ICD-10-CM | POA: Diagnosis not present

## 2021-04-09 DIAGNOSIS — H35033 Hypertensive retinopathy, bilateral: Secondary | ICD-10-CM | POA: Diagnosis not present

## 2021-04-09 DIAGNOSIS — H31091 Other chorioretinal scars, right eye: Secondary | ICD-10-CM | POA: Diagnosis not present

## 2021-04-09 DIAGNOSIS — H35373 Puckering of macula, bilateral: Secondary | ICD-10-CM | POA: Diagnosis not present

## 2021-04-15 ENCOUNTER — Other Ambulatory Visit: Payer: Self-pay | Admitting: Family

## 2021-04-15 ENCOUNTER — Other Ambulatory Visit: Payer: Self-pay | Admitting: Nurse Practitioner

## 2021-04-15 DIAGNOSIS — I872 Venous insufficiency (chronic) (peripheral): Secondary | ICD-10-CM

## 2021-04-22 ENCOUNTER — Other Ambulatory Visit: Payer: Self-pay | Admitting: Nurse Practitioner

## 2021-04-22 ENCOUNTER — Other Ambulatory Visit: Payer: Self-pay | Admitting: Family

## 2021-04-22 DIAGNOSIS — F3341 Major depressive disorder, recurrent, in partial remission: Secondary | ICD-10-CM

## 2021-04-22 DIAGNOSIS — G250 Essential tremor: Secondary | ICD-10-CM

## 2021-04-22 NOTE — Telephone Encounter (Signed)
High risk or very high risk warning populated when attempting to refill medication. RX request sent to PCP for review and approval if warranted.   

## 2021-04-26 ENCOUNTER — Telehealth: Payer: Self-pay | Admitting: *Deleted

## 2021-04-26 NOTE — Telephone Encounter (Signed)
Patient last seen in march,2022  and has no future appointment will need visit to evaluation for hospice.

## 2021-04-26 NOTE — Telephone Encounter (Signed)
[name] with [facility] called and stated that patient's son has reached out to them for Hospice.   [facility] wants to know if you would be Attending.   Please Advise.

## 2021-04-26 NOTE — Telephone Encounter (Signed)
LMOM ,for [facility] with Hospice, with Dinah's response.

## 2021-04-28 ENCOUNTER — Telehealth: Payer: Self-pay | Admitting: Family

## 2021-04-28 NOTE — Telephone Encounter (Signed)
Phone call received from Hospice provider Dr.Feldman states patient's POA request Hospice but patient does not currently meet criteria.requires assistance with ADlL's but still gets around.will have palliative care to evaluate.POA will schedule appointment in one week with PCP to evaluate.

## 2021-04-29 ENCOUNTER — Ambulatory Visit (INDEPENDENT_AMBULATORY_CARE_PROVIDER_SITE_OTHER): Payer: Medicare Other | Admitting: Family

## 2021-04-29 ENCOUNTER — Encounter: Payer: Self-pay | Admitting: Family

## 2021-04-29 ENCOUNTER — Other Ambulatory Visit: Payer: Self-pay

## 2021-04-29 VITALS — BP 110/70 | HR 55 | Temp 97.5°F | Resp 16 | Ht 69.0 in | Wt 141.4 lb

## 2021-04-29 DIAGNOSIS — R2681 Unsteadiness on feet: Secondary | ICD-10-CM | POA: Diagnosis not present

## 2021-04-29 DIAGNOSIS — M5136 Other intervertebral disc degeneration, lumbar region: Secondary | ICD-10-CM | POA: Diagnosis not present

## 2021-04-29 DIAGNOSIS — Z9181 History of falling: Secondary | ICD-10-CM | POA: Diagnosis not present

## 2021-04-29 DIAGNOSIS — G250 Essential tremor: Secondary | ICD-10-CM

## 2021-04-29 DIAGNOSIS — F0281 Dementia in other diseases classified elsewhere with behavioral disturbance: Secondary | ICD-10-CM | POA: Diagnosis not present

## 2021-04-29 DIAGNOSIS — F02818 Dementia in other diseases classified elsewhere, unspecified severity, with other behavioral disturbance: Secondary | ICD-10-CM

## 2021-04-29 DIAGNOSIS — G301 Alzheimer's disease with late onset: Secondary | ICD-10-CM

## 2021-04-29 NOTE — Progress Notes (Signed)
Provider: Marlowe Sax FNP-C  Phebe Dettmer, Nelda Bucks, NP  Patient Care Team: Juliyah Mergen, Nelda Bucks, NP as PCP - General (Family Medicine) Jerline Pain, MD as PCP - Cardiology (Cardiology)  Extended Emergency Contact Information Primary Emergency Contact: Su Monks Address: [address]          [place], [place] [address] Johnnette Litter of [place] Work Phone: [phone] Mobile Phone: [phone] Relation: Son Secondary Emergency Contact: Flonnie Overman Address: [address]          [place], [place] [address] Johnnette Litter of [place] Mobile Phone: [phone] Relation: Relative  Code Status:  DNR Goals of care: Advanced Directive information Advanced Directives 04/29/2021  Does Patient Have a Medical Advance Directive? Yes  Type of Advance Directive -  Does patient want to make changes to medical advance directive? No - Patient declined  Copy of [place] in Chart? -  Pre-existing out of facility DNR order (yellow form or pink MOST form) -     Chief Complaint  Patient presents with   Acute Visit    [place].    HPI:  Pt is a 87 y.o. female seen today for an acute visit for referral to home health.she is here with son [name] who provides HPI information.States patient requires assistance with her ADL's.She lives with son and family.Her daughter lin law has been assisting with her activity of daily living.she is high risk for falls last episode of fall was in June 30 th,2022. Not eating as much as she used to.she down to 1/4 a sandwich at lunch.Her weight is down one pound since last visit. Son states scheduled for a procedure himself which he will need assistance from the wife will need assistance taking care of patient.    Past Medical History:  Diagnosis Date   Benign essential tremor 07/18/2018   Bilateral nonexudative age-related macular degeneration 07/18/2018   Cholestatic hepatitis    2001   DDD (degenerative disc disease), lumbar 04/23/2018    Depression    Dry eyes, bilateral 07/18/2018   Fatigue    GERD (gastroesophageal reflux disease)    Hiatal hernia with GERD 07/29/2011   Hoarding behavior 04/23/2018   HTN (hypertension) 07/29/2011   Hyperlipidemia    Hypertension    Hypothyroidism 07/29/2011   Insomnia    Late onset Alzheimer's disease with behavioral disturbance ([place]) 04/23/2018   LBBB (left bundle branch block) 07/29/2011   Major depressive disorder 07/29/2016   Memory impairment 12/09/2016   Memory loss    Osteoporosis    Paranoid delusion ([place]) 07/18/2018   Psychophysiological insomnia 04/23/2018   Pulmonary HTN ([place]) 03/15/2019   2 d echo 02/28/19 EF 55-60 %, mild to moderate mitral valve regurg, mild tricuspid regurg, severely elevated estimated right ventricular systolic pressure 85 mm Hg, and mild to moderate aortic valve regurg. No diastolic CHF noted.    Pure hypercholesterolemia 07/29/2011   Sciatica    Senile osteoporosis 04/23/2018   Past Surgical History:  Procedure Laterality Date   APPENDECTOMY     CARDIAC CATHETERIZATION     FACIAL COSMETIC SURGERY     TONSILLECTOMY     TOTAL ABDOMINAL HYSTERECTOMY W/ BILATERAL SALPINGOOPHORECTOMY      Allergies  Allergen Reactions   Garlic    Lac Bovis    Lanolin    Penicillins     ? rash   Statins     myalgia    Outpatient Encounter Medications as of 04/29/2021  Medication Sig   Calcium  Carbonate-Vitamin D 600-400 MG-UNIT chew tablet Chew 1 tablet by mouth 3 (three) times daily with meals.   Cholecalciferol (D3 HIGH POTENCY) 2000 units CAPS Take 1 capsule (2,000 Units total) by mouth daily.   diclofenac Sodium (VOLTAREN) 1 % GEL Apply 4 g topically 4 (four) times daily as needed.   donepezil (ARICEPT) 10 MG tablet TAKE 1 TABLET(10 MG) BY MOUTH AT BEDTIME   FLUoxetine (PROZAC) 20 MG tablet TAKE 1 TABLET(20 MG) BY MOUTH DAILY   furosemide (LASIX) 40 MG tablet TAKE 1 TABLET(40 MG) BY MOUTH DAILY   irbesartan (AVAPRO) 300 MG tablet TAKE 1 TABLET(300 MG) BY  MOUTH DAILY   levothyroxine (SYNTHROID) 25 MCG tablet TAKE 1 TABLET(25 MCG) BY MOUTH AT BEDTIME   Melatonin 10 MG TABS Take 1 tablet by mouth at bedtime.   methocarbamol (ROBAXIN) 500 MG tablet Take 0.5 tablets (250 mg total) by mouth every 8 (eight) hours as needed for muscle spasms.   Multiple Vitamins-Minerals (PRESERVISION AREDS 2) CAPS Take 1 capsule by mouth 2 (two) times daily.   omeprazole (PRILOSEC) 20 MG capsule TAKE 1 CAPSULE(20 MG) BY MOUTH DAILY   potassium chloride SA (KLOR-CON) 20 MEQ tablet TAKE 1 TABLET BY MOUTH EVERY DAY   propranolol ER (INDERAL LA) 60 MG 24 hr capsule TAKE 1 CAPSULE(60 MG) BY MOUTH DAILY   traZODone (DESYREL) 50 MG tablet TAKE 1 TABLET(50 MG) BY MOUTH AT BEDTIME   vitamin C (ASCORBIC ACID) 500 MG tablet Take 1 tablet (500 mg total) by mouth daily.   No facility-administered encounter medications on file as of 04/29/2021.    Review of Systems  Constitutional:  Negative for appetite change, chills, fatigue, fever and unexpected weight change.  HENT:  Positive for hearing loss. Negative for congestion, dental problem, ear discharge, ear pain, facial swelling, nosebleeds, postnasal drip, rhinorrhea, sinus pressure, sinus pain, sneezing, sore throat, tinnitus and trouble swallowing.   Eyes:  Negative for pain, discharge, redness, itching and visual disturbance.  Respiratory:  Negative for cough, chest tightness, shortness of breath and wheezing.   Cardiovascular:  Negative for chest pain, palpitations and leg swelling.  Gastrointestinal:  Negative for abdominal distention, abdominal pain, blood in stool, constipation, diarrhea, nausea and vomiting.  Endocrine: Negative for cold intolerance, heat intolerance, polydipsia, polyphagia and polyuria.  Genitourinary:  Negative for difficulty urinating, dysuria, flank pain, frequency and urgency.  Musculoskeletal:  Positive for arthralgias and gait problem. Negative for back pain, joint swelling, myalgias, neck pain and  neck stiffness.  Skin:  Negative for color change, pallor, rash and wound.  Neurological:  Negative for dizziness, syncope, speech difficulty, weakness, light-headedness, numbness and headaches.  Hematological:  Does not bruise/bleed easily.  Psychiatric/Behavioral:  Negative for agitation, behavioral problems, hallucinations, self-injury, sleep disturbance and suicidal ideas. The patient is not nervous/anxious.        Memory loss    Immunization History  Administered Date(s) Administered   Influenza Split 08/14/2009, 06/29/2010, 07/19/2011, 07/29/2016   Influenza, High Dose Seasonal PF 08/08/2012, 07/26/2013, 09/17/2014, 07/31/2015, 08/15/2019   Influenza, Quadrivalent, Recombinant, Inj, Pf 07/19/2017   Influenza,inj,Quad PF,6+ Mos 08/16/2018   Influenza,trivalent, recombinat, inj, PF 07/26/2016   Moderna Sars-Covid-2 Vaccination 10/22/2019, 11/19/2019   Pneumococcal Conjugate-13 08/21/2009   Pneumococcal Polysaccharide-23 04/20/2018   Td 10/13/1997   Tdap 08/31/2006   Zoster Recombinat (Shingrix) 05/25/2018, 08/19/2018   Zoster, Live 06/15/2011   Pertinent  Health Maintenance Due  Topic Date Due   INFLUENZA VACCINE  05/17/2021   DEXA SCAN  Completed  PNA vac Low Risk Adult  Completed   Fall Risk  04/29/2021 01/14/2021 11/06/2020 08/11/2020 08/03/2020  Falls in the past year? 1 1 1 1 1   Number falls in past yr: 0 1 1 1 1   Comment - - - - 5 falls since april 1 last week  Injury with Fall? 0 1 0 1 1  Comment - - - - bruising on the arm  Risk for fall due to : No Fall Risks - - - -  Follow up Falls evaluation completed - - - -   Functional Status Survey:    Vitals:   04/29/21 0933  BP: 110/70  Pulse: (!) 55  Resp: 16  Temp: (!) 97.5 F (36.4 C)  SpO2: 93%  Weight: 141 lb 6.4 oz (64.1 kg)  Height: 5\' 9"  (1.753 m)   Body mass index is 20.88 kg/m. Physical Exam Vitals reviewed.  Constitutional:      General: She is not in acute distress.    Appearance: Normal  appearance. She is normal weight. She is not ill-appearing or diaphoretic.  HENT:     Head: Normocephalic.     Right Ear: Tympanic membrane, ear canal and external ear normal. There is no impacted cerumen.     Left Ear: Tympanic membrane, ear canal and external ear normal. There is no impacted cerumen.     Nose: Nose normal. No congestion or rhinorrhea.     Mouth/Throat:     Mouth: Mucous membranes are moist.     Pharynx: Oropharynx is clear. No oropharyngeal exudate or posterior oropharyngeal erythema.  Eyes:     General: No scleral icterus.       Right eye: No discharge.        Left eye: No discharge.     Extraocular Movements: Extraocular movements intact.     Conjunctiva/sclera: Conjunctivae normal.     Pupils: Pupils are equal, round, and reactive to light.  Neck:     Vascular: No carotid bruit.  Cardiovascular:     Rate and Rhythm: Normal rate and regular rhythm.     Pulses: Normal pulses.     Heart sounds: Normal heart sounds. No murmur heard.   No friction rub. No gallop.  Pulmonary:     Effort: Pulmonary effort is normal. No respiratory distress.     Breath sounds: Normal breath sounds. No wheezing, rhonchi or rales.  Chest:     Chest wall: No tenderness.  Abdominal:     General: Bowel sounds are normal. There is no distension.     Palpations: Abdomen is soft. There is no mass.     Tenderness: There is no abdominal tenderness. There is no right CVA tenderness, left CVA tenderness, guarding or rebound.  Musculoskeletal:        General: No swelling or tenderness. Normal range of motion.     Cervical back: Normal range of motion. No rigidity or tenderness.     Right lower leg: No edema.     Left lower leg: No edema.     Comments: Unsteady gait   Lymphadenopathy:     Cervical: No cervical adenopathy.  Skin:    General: Skin is warm and dry.     Coloration: Skin is not pale.     Findings: No bruising, erythema, lesion or rash.  Neurological:     Mental Status: She is  alert. Mental status is at baseline.     Cranial Nerves: No cranial nerve deficit.     Sensory: No  sensory deficit.     Motor: No weakness.     Coordination: Coordination normal.     Gait: Gait abnormal.     Comments: Tremors worsen on right hand violet sometimes   Psychiatric:        Mood and Affect: Mood normal.        Speech: Speech normal.        Behavior: Behavior normal.        Thought Content: Thought content normal.        Judgment: Judgment normal.    Labs reviewed: Recent Labs    08/03/20 1623 01/14/21 1228  NA 137 138  K 4.0 4.4  CL 100 99  CO2 27 29  GLUCOSE 139 71  BUN 19 28*  CREATININE 1.36* 1.30*  CALCIUM 9.1 9.5   Recent Labs    08/03/20 1623  AST 21  ALT 22  BILITOT 0.4  PROT 6.2   Recent Labs    08/03/20 1623 01/14/21 1228  WBC 7.2 9.8  NEUTROABS 5,328 7,585  HGB 12.5 13.3  HCT 37.1 39.9  MCV 94.2 94.8  PLT 174 169   Lab Results  Component Value Date   TSH 2.26 08/03/2020   No results found for: HGBA1C Lab Results  Component Value Date   CHOL 244 (A) 04/19/2018   HDL 54 04/19/2018   LDLCALC 168 04/19/2018   TRIG 110 04/19/2018    Significant Diagnostic Results in last 30 days:  No results found.  Assessment/Plan  1. Benign essential tremor Chronic.worst on right hand  - Ambulatory referral to [place] assistant to assist with ADL's  - Amb Referral to Palliative Care  2. Late onset Alzheimer's disease with behavioral disturbance ([place]) No new behavioral issues  - Ambulatory referral to [facility] for assistance with ADL's   - Amb Referral to Palliative Care  3. Unsteady gait Hx of falls. PT for exercise,ROM,gait stability and muscle strengthening  - Ambulatory referral to [place] - Amb Referral to Palliative Care  4. History of fall Remains high risk for falls. - Ambulatory referral to Home Health - Amb Referral to Palliative Care  5. DDD (degenerative disc disease), lumbar Continue on Voltaren  gel and Tylenol as needed  - Ambulatory referral to [place] - Amb Referral to Palliative Care  Family/ staff Communication: Reviewed plan of care with patient and son verbalized understanding.  Labs/tests ordered: None   Next Appointment: 6 months for medical management of chronic issues.  Sandrea Hughs, NP

## 2021-04-29 NOTE — Patient Instructions (Addendum)
-   Referral ordered for Home health Physical therapy and Nurse Assistant agency will call you for appointment  - Referral ordered for Palliative care will call you for appointment

## 2021-04-30 ENCOUNTER — Telehealth: Payer: Self-pay

## 2021-04-30 NOTE — Telephone Encounter (Signed)
SW spoke with patient's son-Scott inn an effort to schedule the initial palliative care visit for patient.  Scott delayed scheduling an appointment for initial visit because he is waiting on a call regarding an appointment for a personal procedure. We are shooting for a visit on Monday. He will call SW back as soon as he finds out about his appointment.

## 2021-05-03 ENCOUNTER — Other Ambulatory Visit: Payer: Medicare Other | Admitting: *Deleted

## 2021-05-03 ENCOUNTER — Other Ambulatory Visit: Payer: Medicare Other

## 2021-05-03 ENCOUNTER — Other Ambulatory Visit: Payer: Self-pay

## 2021-05-03 VITALS — BP 133/79 | HR 52 | Temp 97.8°F | Resp 16

## 2021-05-03 DIAGNOSIS — Z515 Encounter for palliative care: Secondary | ICD-10-CM

## 2021-05-03 NOTE — Progress Notes (Signed)
COMMUNITY PALLIATIVE CARE SW NOTE  PATIENT NAME: April Stevenson DOB: [birth year] MRN: [id number]  PRIMARY CARE PROVIDER: Sandrea Hughs, NP  RESPONSIBLE PARTY:  Acct ID - Guarantor Home Phone Work Phone Relationship Acct Type  [id number] - [facility]* [phone]  Self P/F     [address], [place], [place] [address]     PLAN OF CARE and INTERVENTIONS:             GOALS OF CARE/ ADVANCE CARE PLANNING: Goal is for patient to be safe and engage in social stimulation as much as possible. Patient is a DNR. SOCIAL/EMOTIONAL/SPIRITUAL ASSESSMENT/ INTERVENTIONS: SW and RN- M. Nadara Mustard completed an initial visit with patient at her home. Her son and daughter-in-law were also present. The team provided education regarding the palliative care team, role in patient's care, visit frequency and when to call and to contact. Patient's son provided verbal consent to services. He advised that patient will be going to [facility] on tomorrow for a 3 week respite while he receives infusion therapy. He advised that patient's advanced dementia is different daily. Patient continues to ambulate independently, but her gait unsteady. Patient has had falls. Patient has a stiffness to her neck following a fall. Patient moved from her home in [place] four years ago. She moved in with her son 15 months ago. Patient enjoys social stimulation and games. They also have friends that come for four hours a day to play games with patient. Patient's appetite remains good. Patient is a DNR, POA/HCPOA is patient's son. SW provided support, education, assessment of needs and comfort of needs of patient, and coping and needs of PCG. PATIENT/CAREGIVER EDUCATION/ COPING:  PCG is experiencing his own health issues, but coping well. His wife is supportive and also assist with patient's care. PERSONAL EMERGENCY PLAN:  911 can be activated for emergencies.  COMMUNITY RESOURCES COORDINATION/ HEALTH CARE NAVIGATION:  Patient has  friends that come in to provide social stimulation with her daily. FINANCIAL/LEGAL CONCERNS/INTERVENTIONS:  None.     SOCIAL HX:  Social History   Tobacco Use   Smoking status: Former    Packs/day: 0.50    Years: 20.00    Pack years: 10.00    Types: Cigarettes   Smokeless tobacco: Never  Substance Use Topics   Alcohol use: Yes    Alcohol/week: 1.0 standard drink    Types: 1 Glasses of wine per week    Comment: 5-6 times a month.    CODE STATUS: DNR ADVANCED DIRECTIVES: Yes MOST FORM COMPLETE: Yes HOSPICE EDUCATION PROVIDED: Yes, hospice vs. Palliative care  PPS: Patient is alert and oriented to self and situation. She ambulates independently, but her gait is unsteady.   Duration of visit and documentation: 60 minutes.      [address] [place], [place]

## 2021-05-04 NOTE — Progress Notes (Signed)
[facility] PALLIATIVE CARE RN NOTE  PATIENT NAME: April Stevenson DOB: 15-Jul-1924 MRN: [id number]  PRIMARY CARE PROVIDER: Sandrea Hughs, NP  RESPONSIBLE PARTY: Su Monks (son) Acct ID - Guarantor Home Phone Work Phone Relationship Acct Type  [id number] - [facility]* [phone]  Self P/F     [address], [place], [place] [address]   Covid-19 Pre-screening Negative  PLAN OF CARE and INTERVENTION:  ADVANCE CARE PLANNING/GOALS OF CARE: Goal is for patient to continue to have a good quality of life. PATIENT/CAREGIVER EDUCATION: Explained palliative care services, symptom management, safe mobility, fall prevention DISEASE STATUS: Joint initial palliative care visit completed with Katheren Puller, MSW. Met with patient, son and daughter-in-law in their home. Patient was recently evaluated for hospice services on  04/28/21 but is not currently eligible as her prognosis is believed to be greater than 6 months. Patient sitting up at dining room table awake and alert. She is able to answer simple questions with short replies, but is confused most of the time. Mood is pleasant. She denies pain. Son reports some shortness of breath with exertion, but is able to catch her breath within 30-60 seconds once at rest. She is able to stand and ambulate without assistance, but does have an unsteady gait and falls. She will not utilize a walker. She has had about 7-8 falls over the past 15 months. She requires 1 person assistance with all ADLs except feeding. She mainly eats less than 25% of meals. No difficulty swallowing. Apparent muscle wasting. Daughter-in-law has had to adjust patient's clothing so they will not be so loose on her. Patient is still able to play some board games, her favorite game being Scrabble. They have someone to visit daily from 12:30a - 4:30p to play games with patient and provide socialization. Son has a respite scheduled for patient starting tomorrow 05/04/21 at [facility] due to him having 3 weeks of chemotherapy treatments to complete starting this week. They are agreeable with future visits from palliative care.    HISTORY OF PRESENT ILLNESS:  This is a 87 yo female with a diagnosis of Alzheimer's dementia. Palliative care team has been asked to follow patient for additional support and complex decision making.  CODE STATUS: DNR ADVANCED DIRECTIVES: Y MOST FORM: yes PPS: 50%   PHYSICAL EXAM:   VITALS: Today's Vitals   05/03/21 1332  BP: 133/79  Pulse: (!) 52  Resp: 16  Temp: 97.8 F (36.6 C)  TempSrc: Temporal  SpO2: 93%  PainSc: 0-No pain    LUNGS: clear to auscultation  CARDIAC: Cor Loletha Grayer EXTREMITIES: No edema SKIN:  No skin issues   NEURO:  Alert and oriented to self, confusion, unsteady gait, generalized weakness, ambulatory   (Duration of visit and documentation 75 minutes)   Daryl Eastern, RN BSN

## 2021-05-14 DIAGNOSIS — H353231 Exudative age-related macular degeneration, bilateral, with active choroidal neovascularization: Secondary | ICD-10-CM | POA: Diagnosis not present

## 2021-05-21 ENCOUNTER — Observation Stay (HOSPITAL_BASED_OUTPATIENT_CLINIC_OR_DEPARTMENT_OTHER)
Admission: EM | Admit: 2021-05-21 | Discharge: 2021-05-27 | Disposition: A | Discharge status: Home / Self Care | Payer: Medicare Other | Attending: Internal Medicine | Admitting: Internal Medicine

## 2021-05-21 ENCOUNTER — Emergency Department (HOSPITAL_BASED_OUTPATIENT_CLINIC_OR_DEPARTMENT_OTHER): Payer: Medicare Other

## 2021-05-21 ENCOUNTER — Encounter (HOSPITAL_BASED_OUTPATIENT_CLINIC_OR_DEPARTMENT_OTHER): Payer: Self-pay

## 2021-05-21 ENCOUNTER — Other Ambulatory Visit: Payer: Self-pay

## 2021-05-21 ENCOUNTER — Observation Stay (HOSPITAL_COMMUNITY): Payer: Medicare Other

## 2021-05-21 ENCOUNTER — Ambulatory Visit: Payer: Medicare Other | Admitting: Podiatry

## 2021-05-21 DIAGNOSIS — E039 Hypothyroidism, unspecified: Secondary | ICD-10-CM | POA: Diagnosis present

## 2021-05-21 DIAGNOSIS — Z87891 Personal history of nicotine dependence: Secondary | ICD-10-CM | POA: Insufficient documentation

## 2021-05-21 DIAGNOSIS — S0990XA Unspecified injury of head, initial encounter: Secondary | ICD-10-CM | POA: Diagnosis not present

## 2021-05-21 DIAGNOSIS — M5021 Other cervical disc displacement,  high cervical region: Secondary | ICD-10-CM | POA: Diagnosis not present

## 2021-05-21 DIAGNOSIS — N183 Chronic kidney disease, stage 3 unspecified: Secondary | ICD-10-CM | POA: Diagnosis present

## 2021-05-21 DIAGNOSIS — M4802 Spinal stenosis, cervical region: Secondary | ICD-10-CM | POA: Diagnosis not present

## 2021-05-21 DIAGNOSIS — Z043 Encounter for examination and observation following other accident: Secondary | ICD-10-CM | POA: Diagnosis not present

## 2021-05-21 DIAGNOSIS — Z20822 Contact with and (suspected) exposure to covid-19: Secondary | ICD-10-CM | POA: Diagnosis not present

## 2021-05-21 DIAGNOSIS — I517 Cardiomegaly: Secondary | ICD-10-CM | POA: Diagnosis not present

## 2021-05-21 DIAGNOSIS — F02818 Dementia in other diseases classified elsewhere, unspecified severity, with other behavioral disturbance: Secondary | ICD-10-CM | POA: Diagnosis present

## 2021-05-21 DIAGNOSIS — I10 Essential (primary) hypertension: Secondary | ICD-10-CM | POA: Diagnosis present

## 2021-05-21 DIAGNOSIS — W19XXXA Unspecified fall, initial encounter: Secondary | ICD-10-CM | POA: Diagnosis present

## 2021-05-21 DIAGNOSIS — S12111A Posterior displaced Type II dens fracture, initial encounter for closed fracture: Secondary | ICD-10-CM | POA: Diagnosis not present

## 2021-05-21 DIAGNOSIS — Z79899 Other long term (current) drug therapy: Secondary | ICD-10-CM | POA: Insufficient documentation

## 2021-05-21 DIAGNOSIS — M50222 Other cervical disc displacement at C5-C6 level: Secondary | ICD-10-CM | POA: Diagnosis not present

## 2021-05-21 DIAGNOSIS — N1831 Chronic kidney disease, stage 3a: Secondary | ICD-10-CM | POA: Diagnosis not present

## 2021-05-21 DIAGNOSIS — S12100A Unspecified displaced fracture of second cervical vertebra, initial encounter for closed fracture: Secondary | ICD-10-CM | POA: Diagnosis not present

## 2021-05-21 DIAGNOSIS — M160 Bilateral primary osteoarthritis of hip: Secondary | ICD-10-CM | POA: Diagnosis not present

## 2021-05-21 DIAGNOSIS — G301 Alzheimer's disease with late onset: Secondary | ICD-10-CM | POA: Diagnosis present

## 2021-05-21 DIAGNOSIS — Z66 Do not resuscitate: Secondary | ICD-10-CM | POA: Diagnosis present

## 2021-05-21 DIAGNOSIS — R404 Transient alteration of awareness: Secondary | ICD-10-CM | POA: Diagnosis not present

## 2021-05-21 DIAGNOSIS — G319 Degenerative disease of nervous system, unspecified: Secondary | ICD-10-CM | POA: Diagnosis not present

## 2021-05-21 DIAGNOSIS — S199XXA Unspecified injury of neck, initial encounter: Secondary | ICD-10-CM | POA: Diagnosis present

## 2021-05-21 DIAGNOSIS — R2681 Unsteadiness on feet: Secondary | ICD-10-CM | POA: Diagnosis not present

## 2021-05-21 DIAGNOSIS — R55 Syncope and collapse: Secondary | ICD-10-CM | POA: Diagnosis not present

## 2021-05-21 DIAGNOSIS — F0281 Dementia in other diseases classified elsewhere with behavioral disturbance: Secondary | ICD-10-CM

## 2021-05-21 DIAGNOSIS — S12190A Other displaced fracture of second cervical vertebra, initial encounter for closed fracture: Secondary | ICD-10-CM | POA: Diagnosis not present

## 2021-05-21 DIAGNOSIS — Y92003 Bedroom of unspecified non-institutional (private) residence as the place of occurrence of the external cause: Secondary | ICD-10-CM | POA: Diagnosis not present

## 2021-05-21 DIAGNOSIS — R001 Bradycardia, unspecified: Secondary | ICD-10-CM | POA: Diagnosis not present

## 2021-05-21 DIAGNOSIS — R0902 Hypoxemia: Secondary | ICD-10-CM | POA: Diagnosis not present

## 2021-05-21 LAB — COMPREHENSIVE METABOLIC PANEL
ALT: 12 U/L (ref 0–44)
AST: 25 U/L (ref 15–41)
Albumin: 3.5 g/dL (ref 3.5–5.0)
Alkaline Phosphatase: 105 U/L (ref 38–126)
Anion gap: 9 (ref 5–15)
BUN: 25 mg/dL — ABNORMAL HIGH (ref 8–23)
CO2: 33 mmol/L — ABNORMAL HIGH (ref 22–32)
Calcium: 9.3 mg/dL (ref 8.9–10.3)
Chloride: 98 mmol/L (ref 98–111)
Creatinine, Ser: 1.1 mg/dL — ABNORMAL HIGH (ref 0.44–1.00)
GFR, Estimated: 46 mL/min — ABNORMAL LOW (ref >60)
Glucose, Bld: 115 mg/dL — ABNORMAL HIGH (ref 70–99)
Potassium: 4.1 mmol/L (ref 3.5–5.1)
Sodium: 140 mmol/L (ref 135–145)
Total Bilirubin: 0.8 mg/dL (ref 0.3–1.2)
Total Protein: 6.6 g/dL (ref 6.5–8.1)

## 2021-05-21 LAB — CBC WITH DIFFERENTIAL/PLATELET
Abs Immature Granulocytes: 0.03 10*3/uL (ref 0.00–0.07)
Basophils Absolute: 0 10*3/uL (ref 0.0–0.1)
Basophils Relative: 0 %
Eosinophils Absolute: 0.2 10*3/uL (ref 0.0–0.5)
Eosinophils Relative: 2 %
HCT: 37.2 % (ref 36.0–46.0)
Hemoglobin: 12.3 g/dL (ref 12.0–15.0)
Immature Granulocytes: 0 %
Lymphocytes Relative: 8 %
Lymphs Abs: 0.9 10*3/uL (ref 0.7–4.0)
MCH: 31.2 pg (ref 26.0–34.0)
MCHC: 33.1 g/dL (ref 30.0–36.0)
MCV: 94.4 fL (ref 80.0–100.0)
Monocytes Absolute: 0.8 10*3/uL (ref 0.1–1.0)
Monocytes Relative: 7 %
Neutro Abs: 9.7 10*3/uL — ABNORMAL HIGH (ref 1.7–7.7)
Neutrophils Relative %: 83 %
Platelets: 187 10*3/uL (ref 150–400)
RBC: 3.94 MIL/uL (ref 3.87–5.11)
RDW: 12.5 % (ref 11.5–15.5)
WBC: 11.7 10*3/uL — ABNORMAL HIGH (ref 4.0–10.5)
nRBC: 0 % (ref 0.0–0.2)

## 2021-05-21 LAB — TSH: TSH: 2.389 u[IU]/mL (ref 0.350–4.500)

## 2021-05-21 LAB — RESP PANEL BY RT-PCR (FLU A&B, COVID) ARPGX2
Influenza A by PCR: NEGATIVE
Influenza B by PCR: NEGATIVE
SARS Coronavirus 2 by RT PCR: NEGATIVE

## 2021-05-21 IMAGING — CT CT CERVICAL SPINE W/O CM
3 series · 12 of 35 positions shown, 14 images · non-contrast
Comparison: None.

CLINICAL DATA: Fall

EXAM:
CT HEAD WITHOUT CONTRAST
CT CERVICAL SPINE WITHOUT CONTRAST
TECHNIQUE: Multidetector CT imaging of the head and cervical spine was
performed following the standard protocol without intravenous
contrast. Multiplanar CT image reconstructions of the cervical spine
were also generated.

[Series 4: c spine soft · axial · 0.41mm/px · z∈[-512,-416]mm · 4 of 70 slices shown, 5 images]
[im 11/70  soft-tissue]
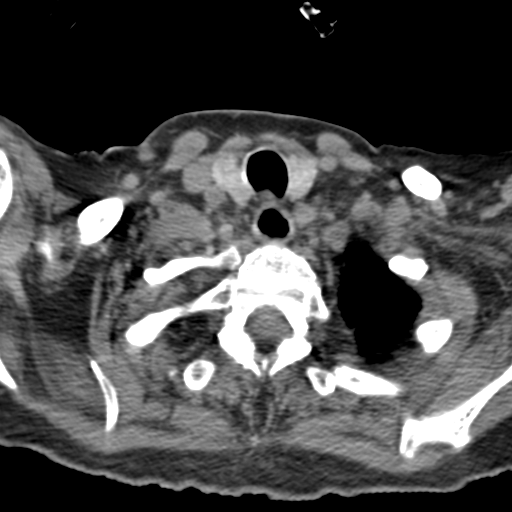
[im 11/70  bone]
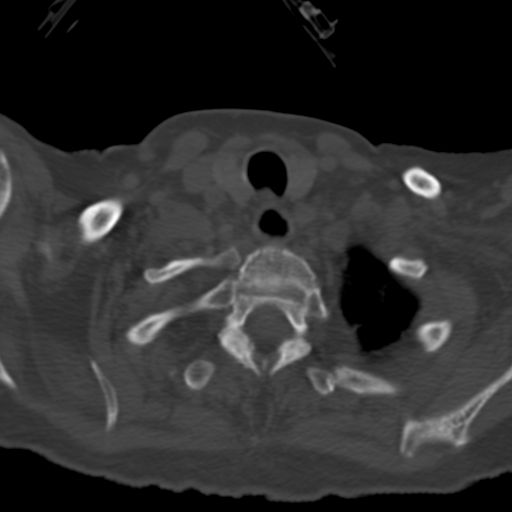
[im 27/70  bone]
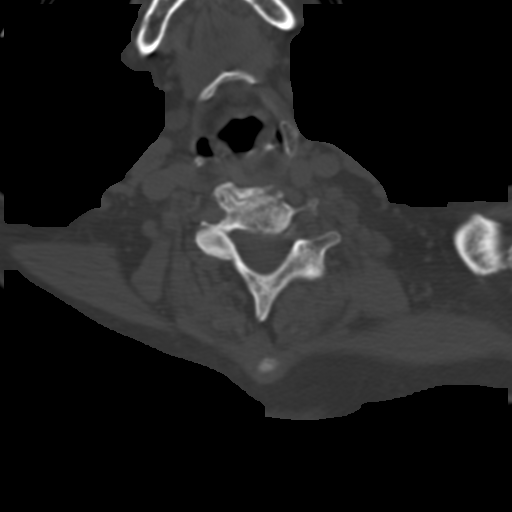
[im 43/70  bone]
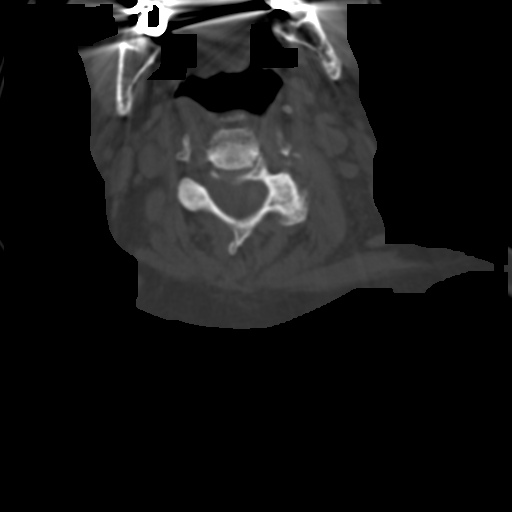
[im 59/70  bone]
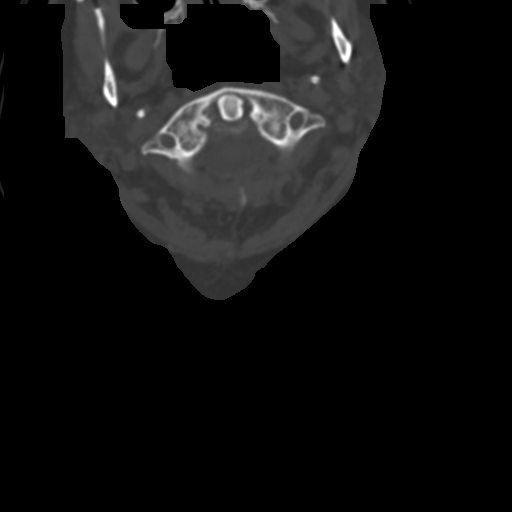

[Series 5: cor bone · coronal · 0.26mm/px · 3 of 46 slices shown]
[im 10/46  bone]
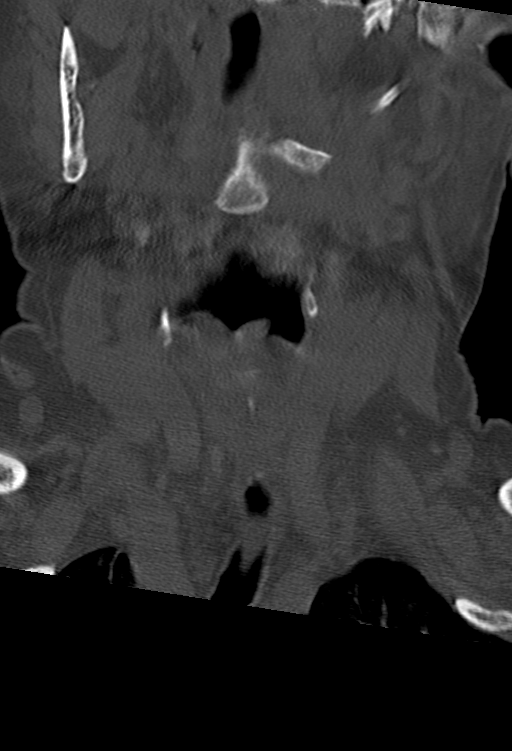
[im 19/46  bone]
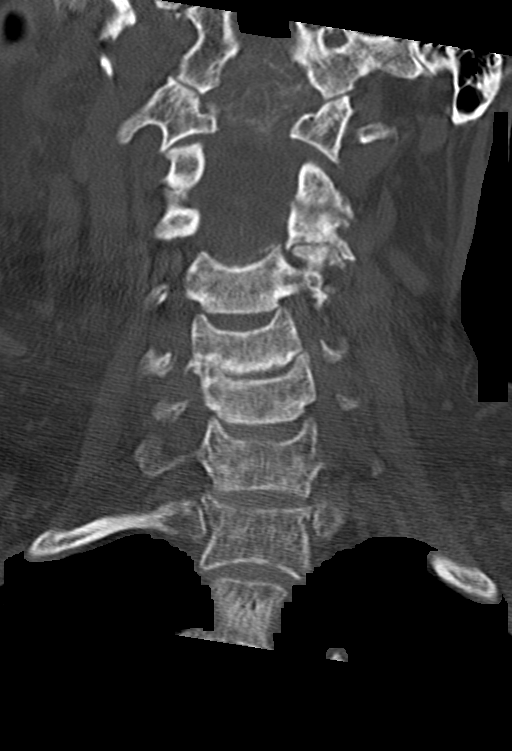
[im 28/46  bone]
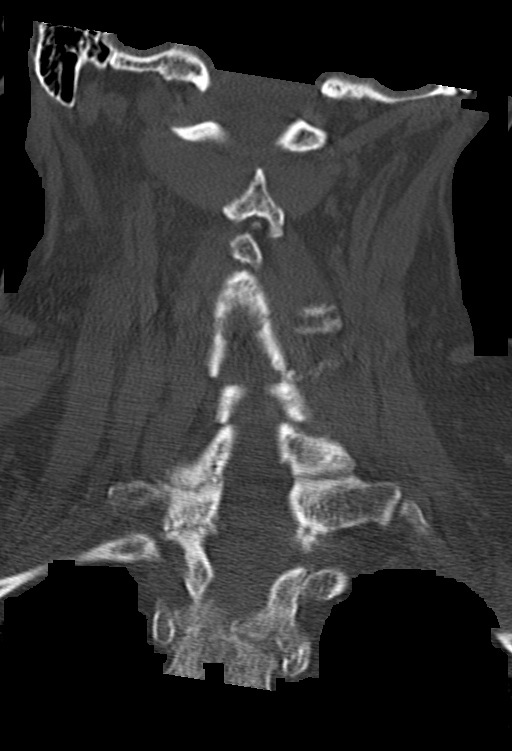

[Series 6: sag bone · sagittal · 0.26mm/px · 5 of 48 slices shown, 6 images]
[im 16/48  bone]
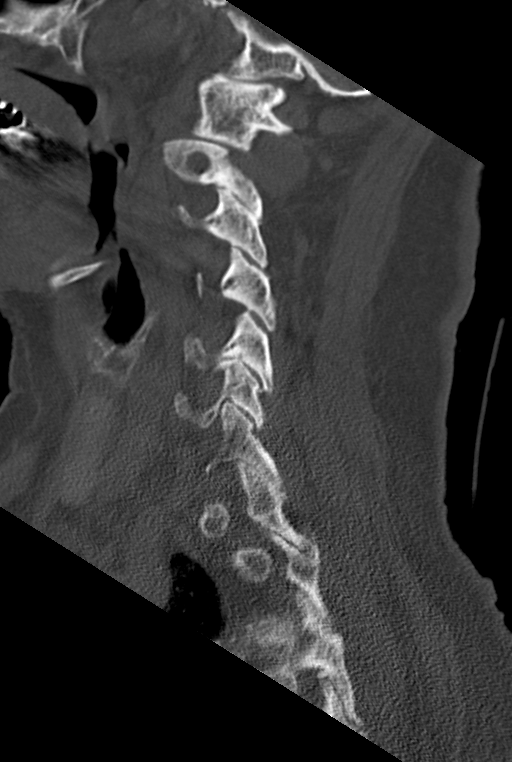
[im 20/48  bone]
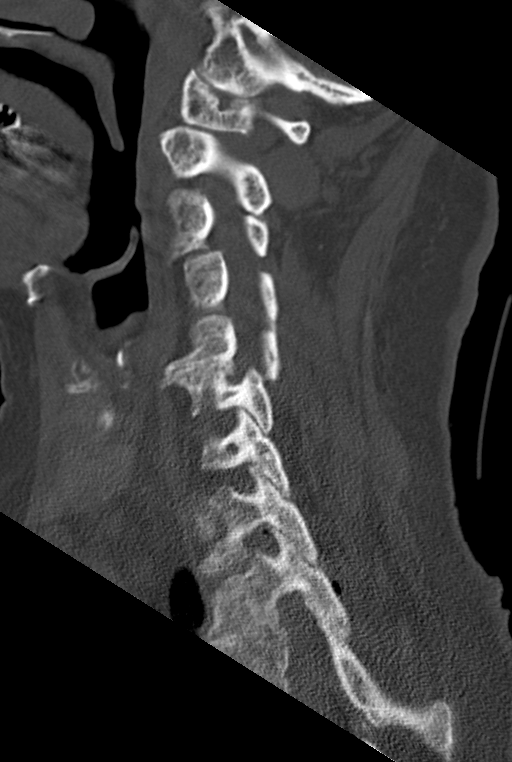
[im 24/48  soft-tissue]
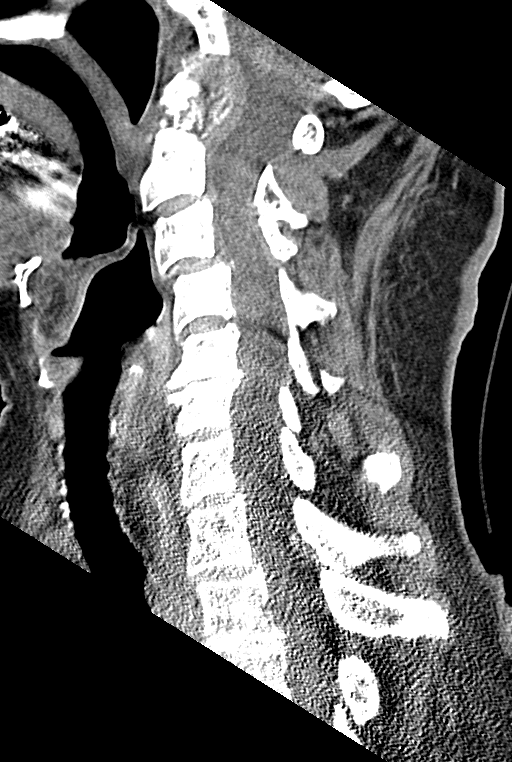
[im 24/48  bone]
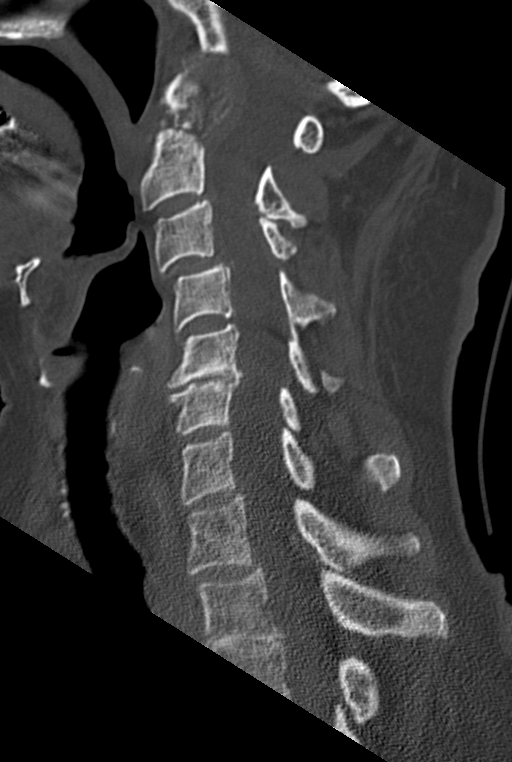
[im 28/48  bone]
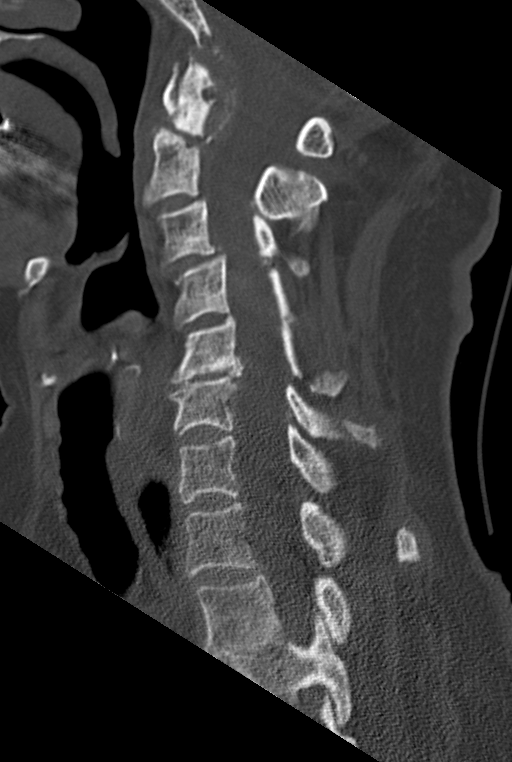
[im 32/48  bone]
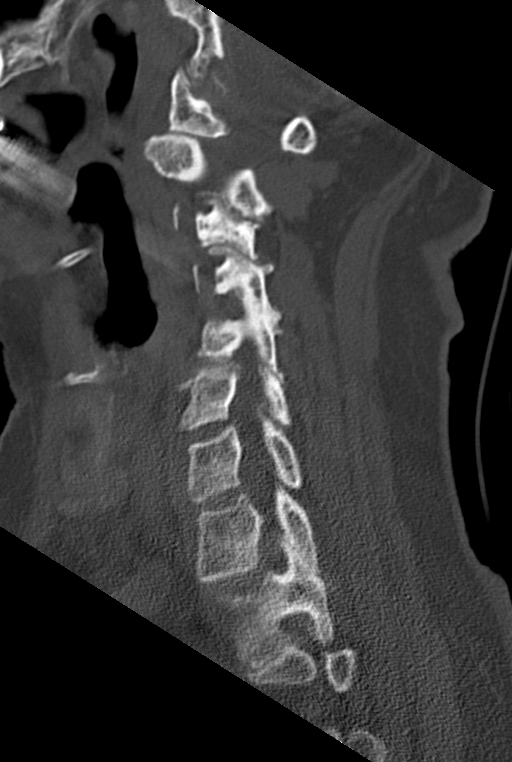

[12 of 35 positions shown; findings below may reference images not displayed]

FINDINGS: CT HEAD FINDINGS

Brain: There is no mass, hemorrhage or extra-axial collection. There
is generalized atrophy without lobar predilection. There is
hypoattenuation of the periventricular white matter, most commonly
indicating chronic ischemic microangiopathy.

Vascular: No abnormal hyperdensity of the major intracranial
arteries or dural venous sinuses. No intracranial atherosclerosis.

Skull: The visualized skull base, calvarium and extracranial soft
tissues are normal.

Sinuses/Orbits: No fluid levels or advanced mucosal thickening of
the visualized paranasal sinuses. No mastoid or middle ear effusion.
The orbits are normal.

CT CERVICAL SPINE FINDINGS

Alignment: Mild posterior subluxation C1 on C2.

Skull base and vertebrae: Mildly displaced type 2 fracture of the
dens, of uncertain age.

Soft tissues and spinal canal: No prevertebral fluid or swelling. No
visible canal hematoma.

Disc levels: Moderate upper cervical left facet arthrosis.

Upper chest: No pneumothorax, pulmonary nodule or pleural effusion.

Other: Normal visualized paraspinal cervical soft tissues.
IMPRESSION: 1. Chronic ischemic microangiopathy and generalized atrophy without
acute intracranial abnormality.
2. Mildly displaced type 2 fracture of the dens with mild posterior
subluxation C1 on C2. Acuity is uncertain. MRI recommended for
further characterization.

Critical Value/emergent results were called by telephone at the time
of interpretation on [DATE] at [DATE] to provider BARRAZA
, who verbally acknowledged these results.

## 2021-05-21 IMAGING — MR MR CERVICAL SPINE W/O CM
5 series · 37 of 48 positions shown · non-contrast
Comparison: CT cervical spine [DATE]

CLINICAL DATA: Age-indeterminate spinal fracture

EXAM:
MRI CERVICAL SPINE WITHOUT CONTRAST
TECHNIQUE: Multiplanar, multisequence MR imaging of the cervical spine was
performed. No intravenous contrast was administered.

[Series 6: T2 · sagittal · 3.0mm · 0.69mm/px · 7 of 17 slices shown (1 of 2)]
[im 1/17]
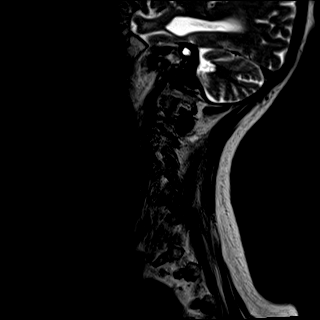
[im 3/17]
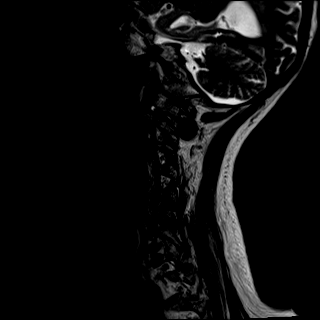
[im 6/17]
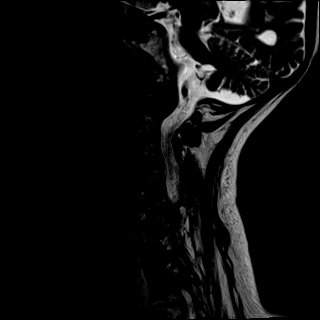
[im 9/17]
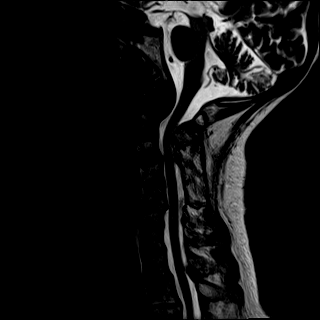
[im 11/17]
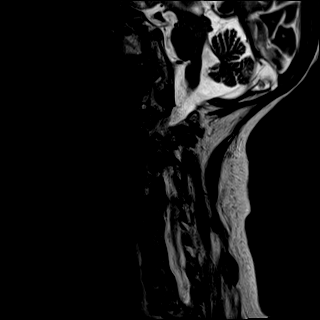
[im 14/17]
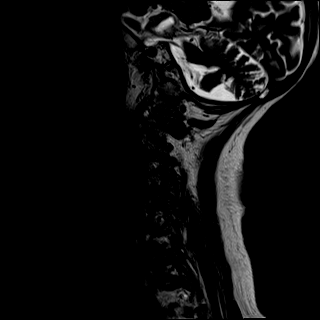
[im 17/17]
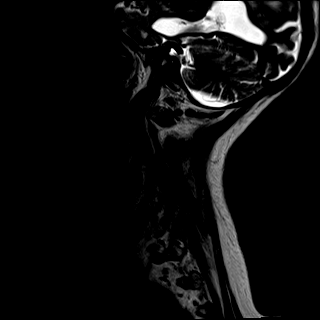

[Series 7: T1 · sagittal · 3.0mm · 0.69mm/px · 7 of 17 slices shown]
[im 1/17]
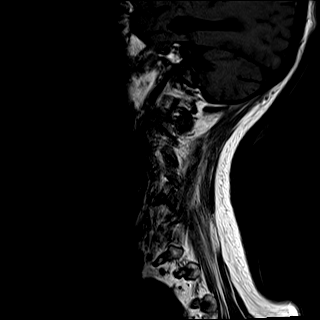
[im 3/17]
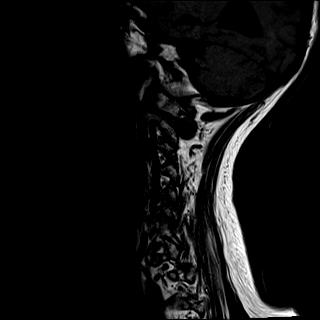
[im 6/17]
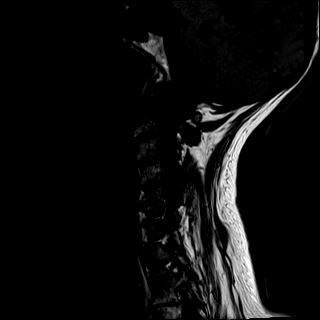
[im 9/17]
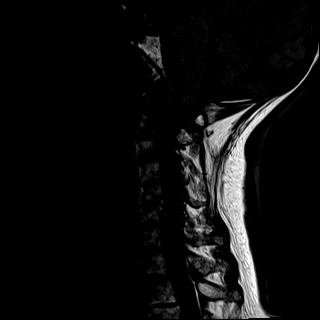
[im 11/17]
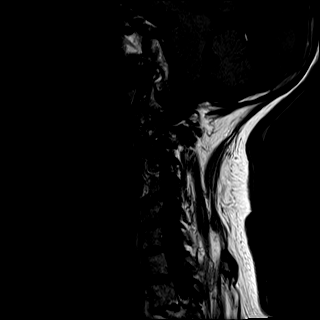
[im 14/17]
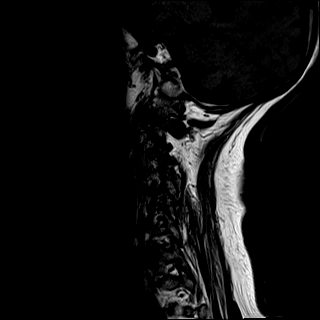
[im 17/17]
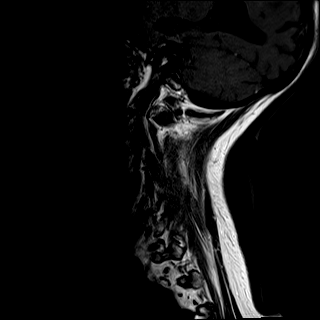

[Series 8: STIR · sagittal · 3.0mm · 0.86mm/px · 6 of 17 slices shown]
[im 1/17]
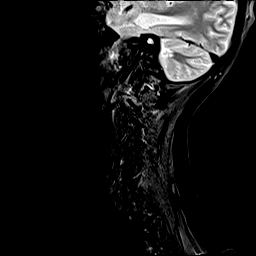
[im 4/17]
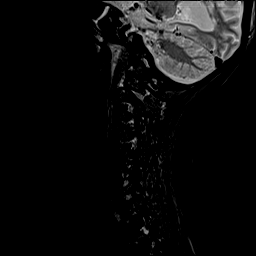
[im 7/17]
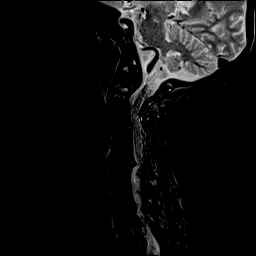
[im 10/17]
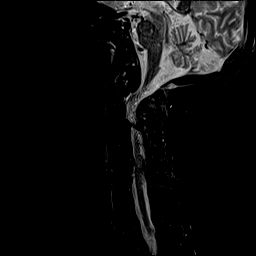
[im 13/17]
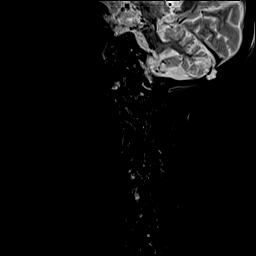
[im 17/17]
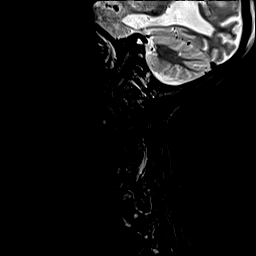

[Series 9: T2 · axial · 3.0mm · 0.66mm/px · z∈[-71,+40]mm · 9 of 38 slices shown (2 of 2)]
[im 1/38]
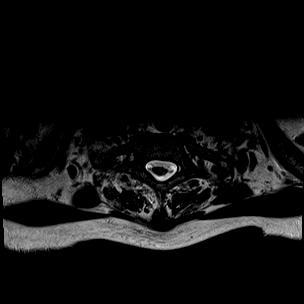
[im 3/38]
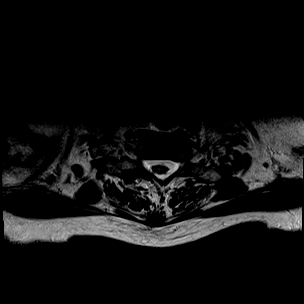
[im 6/38]
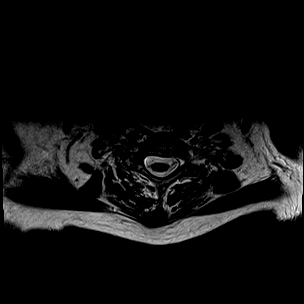
[im 12/38]
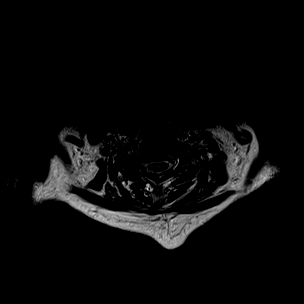
[im 18/38]
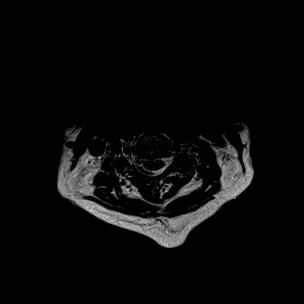
[im 20/38]
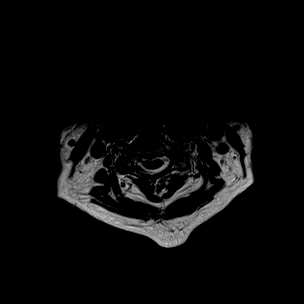
[im 26/38]
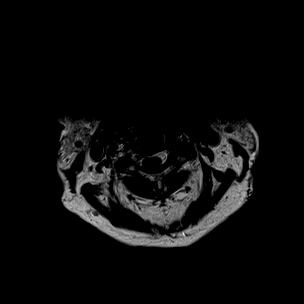
[im 32/38]
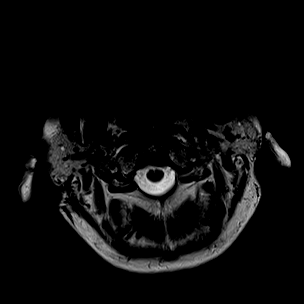
[im 38/38]
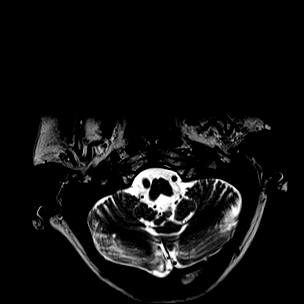

[Series 10: GRE · axial · 3.0mm · 0.39mm/px · z∈[-71,+40]mm · 8 of 38 slices shown]
[im 1/38]
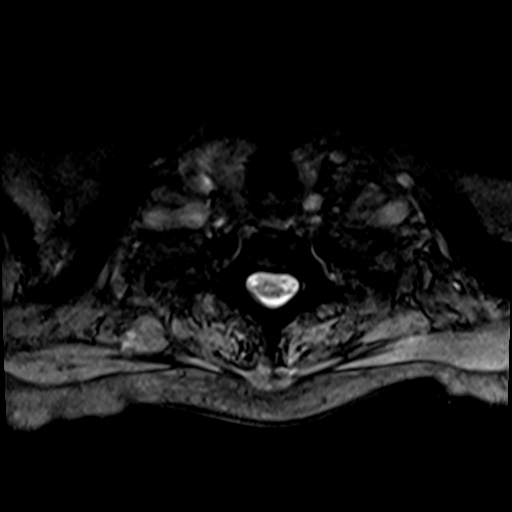
[im 6/38]
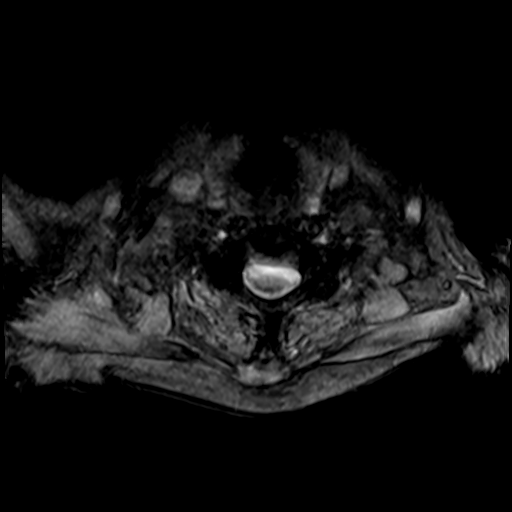
[im 12/38]
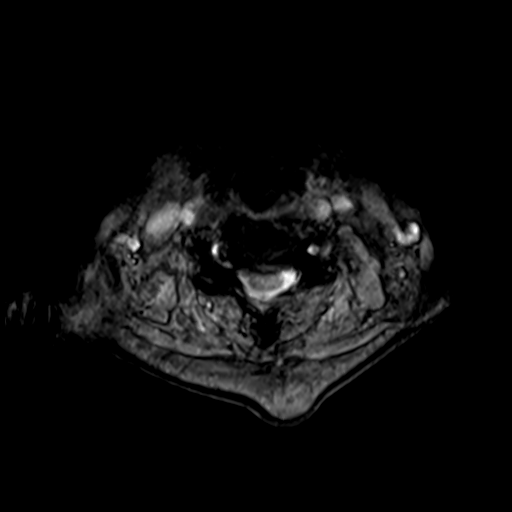
[im 18/38]
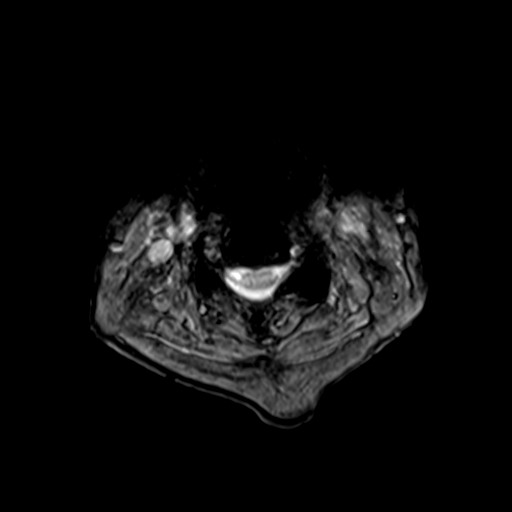
[im 20/38]
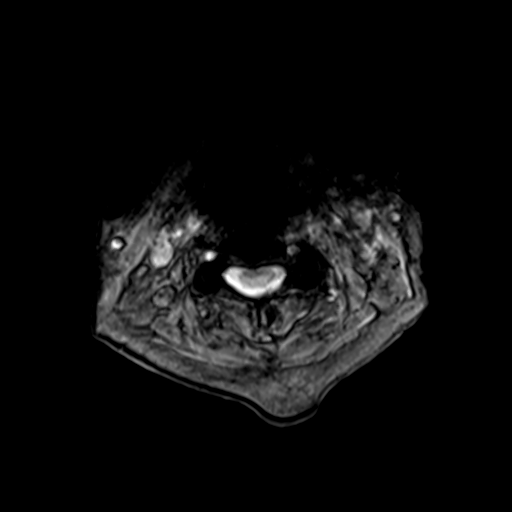
[im 26/38]
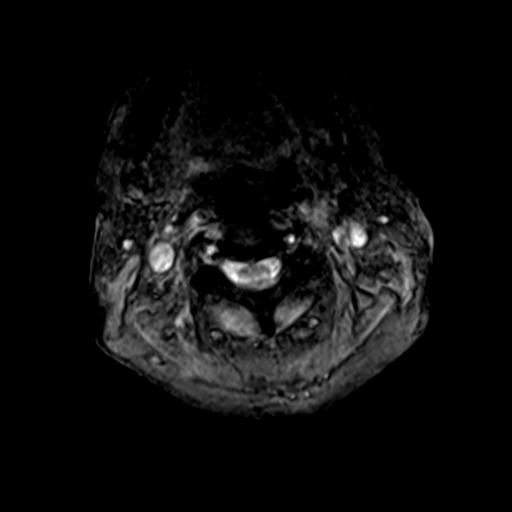
[im 32/38]
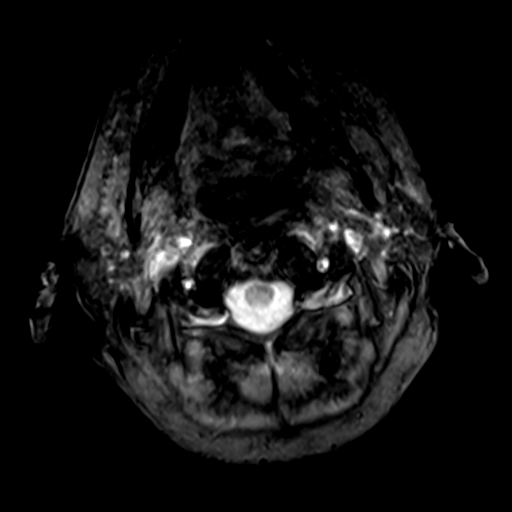
[im 38/38]
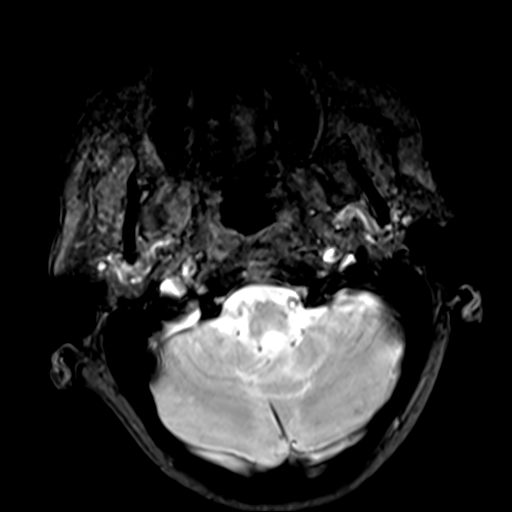

[37 of 48 positions shown; findings below may reference images not displayed]

FINDINGS: Alignment: Angulation at the C2 fracture site is unchanged.
Otherwise, normal cervical spine alignment with mild reversal of
normal lordosis.

Vertebrae: Fracture of C2 (type 2) with mild posterior angulation of
the superior segment. There is moderate edema at the fracture site.
No paravertebral abnormality.

Cord: Normal signal and morphology.

Posterior Fossa, vertebral arteries, paraspinal tissues: Negative.

Disc levels:

C1-2: Unremarkable.

C2-3: Small central disc protrusion. There is no spinal canal
stenosis. No neural foraminal stenosis.

C3-4: Left asymmetric disc bulge with uncinate spurring. Mild spinal
canal stenosis. Left facet hypertrophy with severe left neural
foraminal stenosis.

C4-5: Normal disc space and facet joints. There is no spinal canal
stenosis. No neural foraminal stenosis.

C5-6: Disc space narrowing with small bulge and uncovertebral
hypertrophy. There is no spinal canal stenosis. Severe bilateral
neural foraminal stenosis.

C6-7: Small left subarticular disc protrusion. There is no spinal
canal stenosis. No neural foraminal stenosis.

C7-T1: Normal disc space and facet joints. There is no spinal canal
stenosis. No neural foraminal stenosis.
IMPRESSION: 1. C2 fracture with moderate edema, but no prevertebral effusion or
hematoma. The findings remain somewhat equivocal, but favored to be
acute or recent subacute.
2. Severe bilateral C5-6 neural foraminal stenosis.
3. Mild C3-4 spinal canal stenosis and severe left neural foraminal
stenosis.

## 2021-05-21 IMAGING — DX DG CHEST 1V PORT
1 series · 1 of 1 positions shown · non-contrast
Comparison: None.

CLINICAL DATA: Fall and hypoxia

EXAM:
PORTABLE CHEST 1 VIEW

[chest]
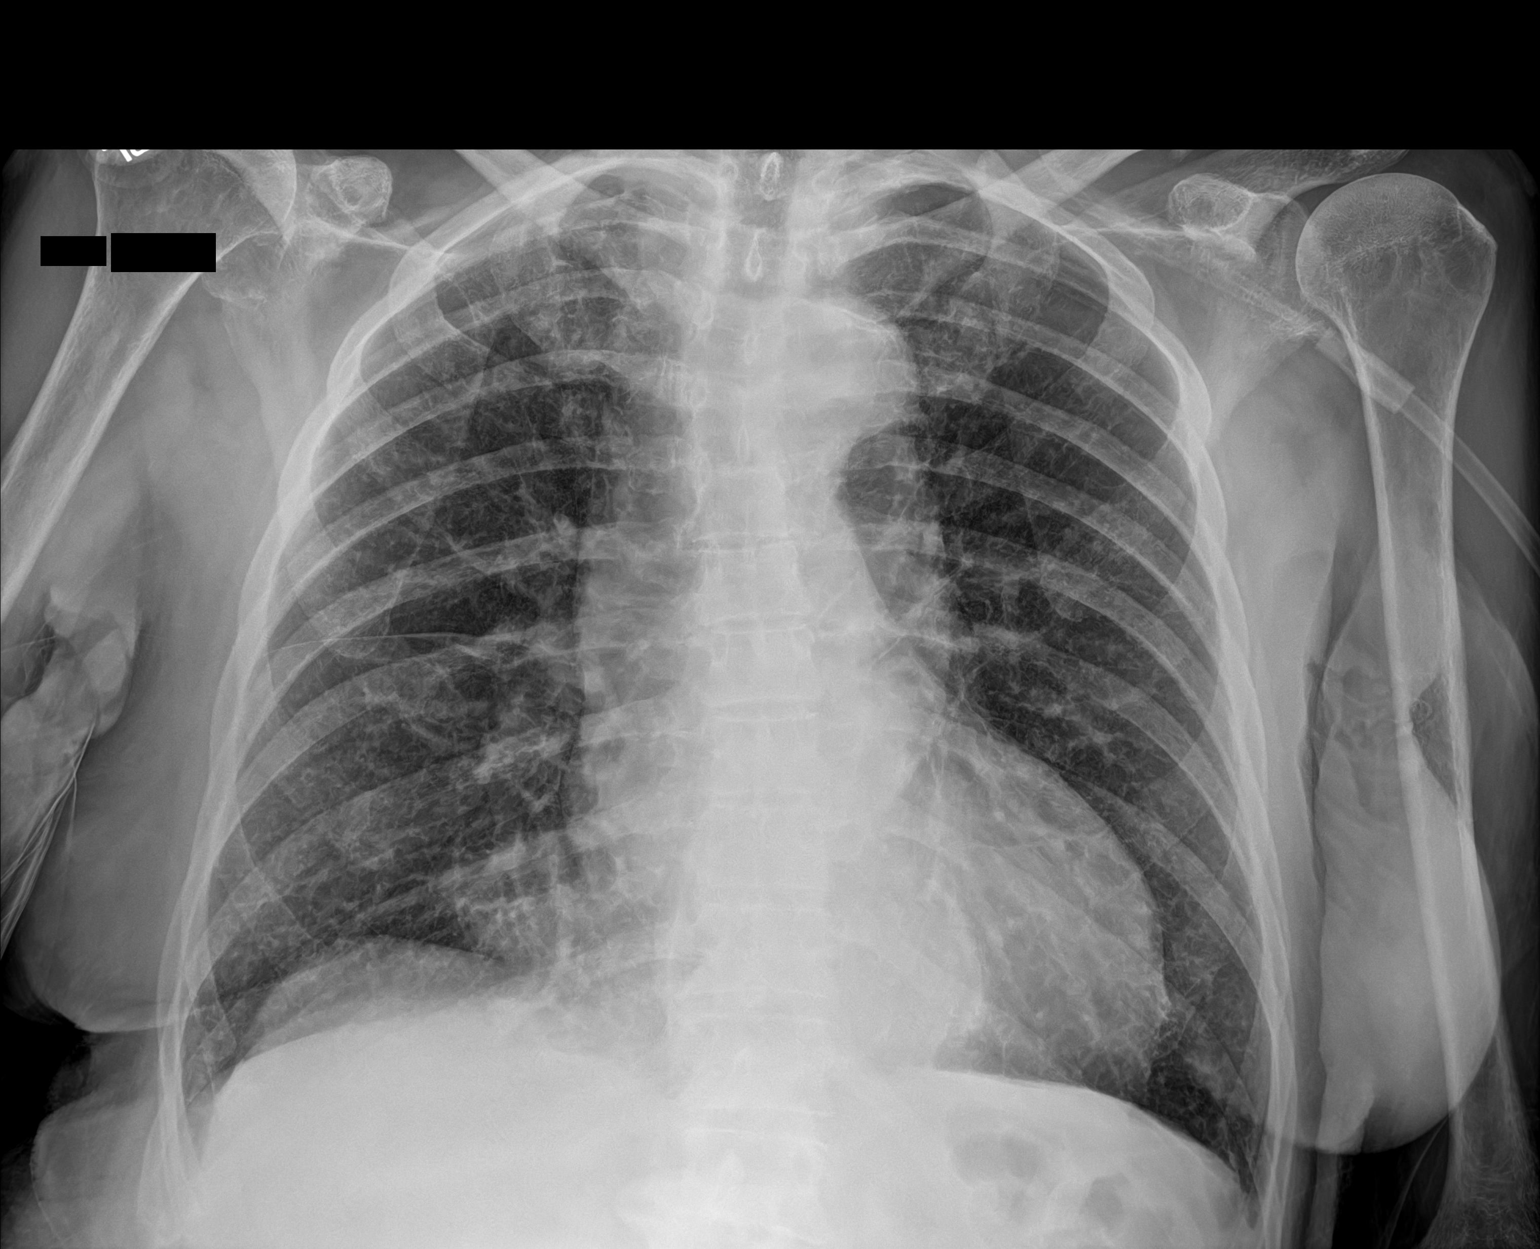

[1 of 1 positions shown; findings below may reference images not displayed]

FINDINGS: No focal consolidation, pleural effusion, or pneumothorax. Mild
cardiomegaly. Atherosclerotic calcification of the aorta. Small
hiatal hernia. No acute osseous pathology.
IMPRESSION: No acute cardiopulmonary process.

## 2021-05-21 IMAGING — DX DG PORTABLE PELVIS
1 series · 1 of 1 positions shown · non-contrast
Comparison: None.

CLINICAL DATA: Fall.

EXAM:
PORTABLE PELVIS 1-2 VIEWS

[pelvis]
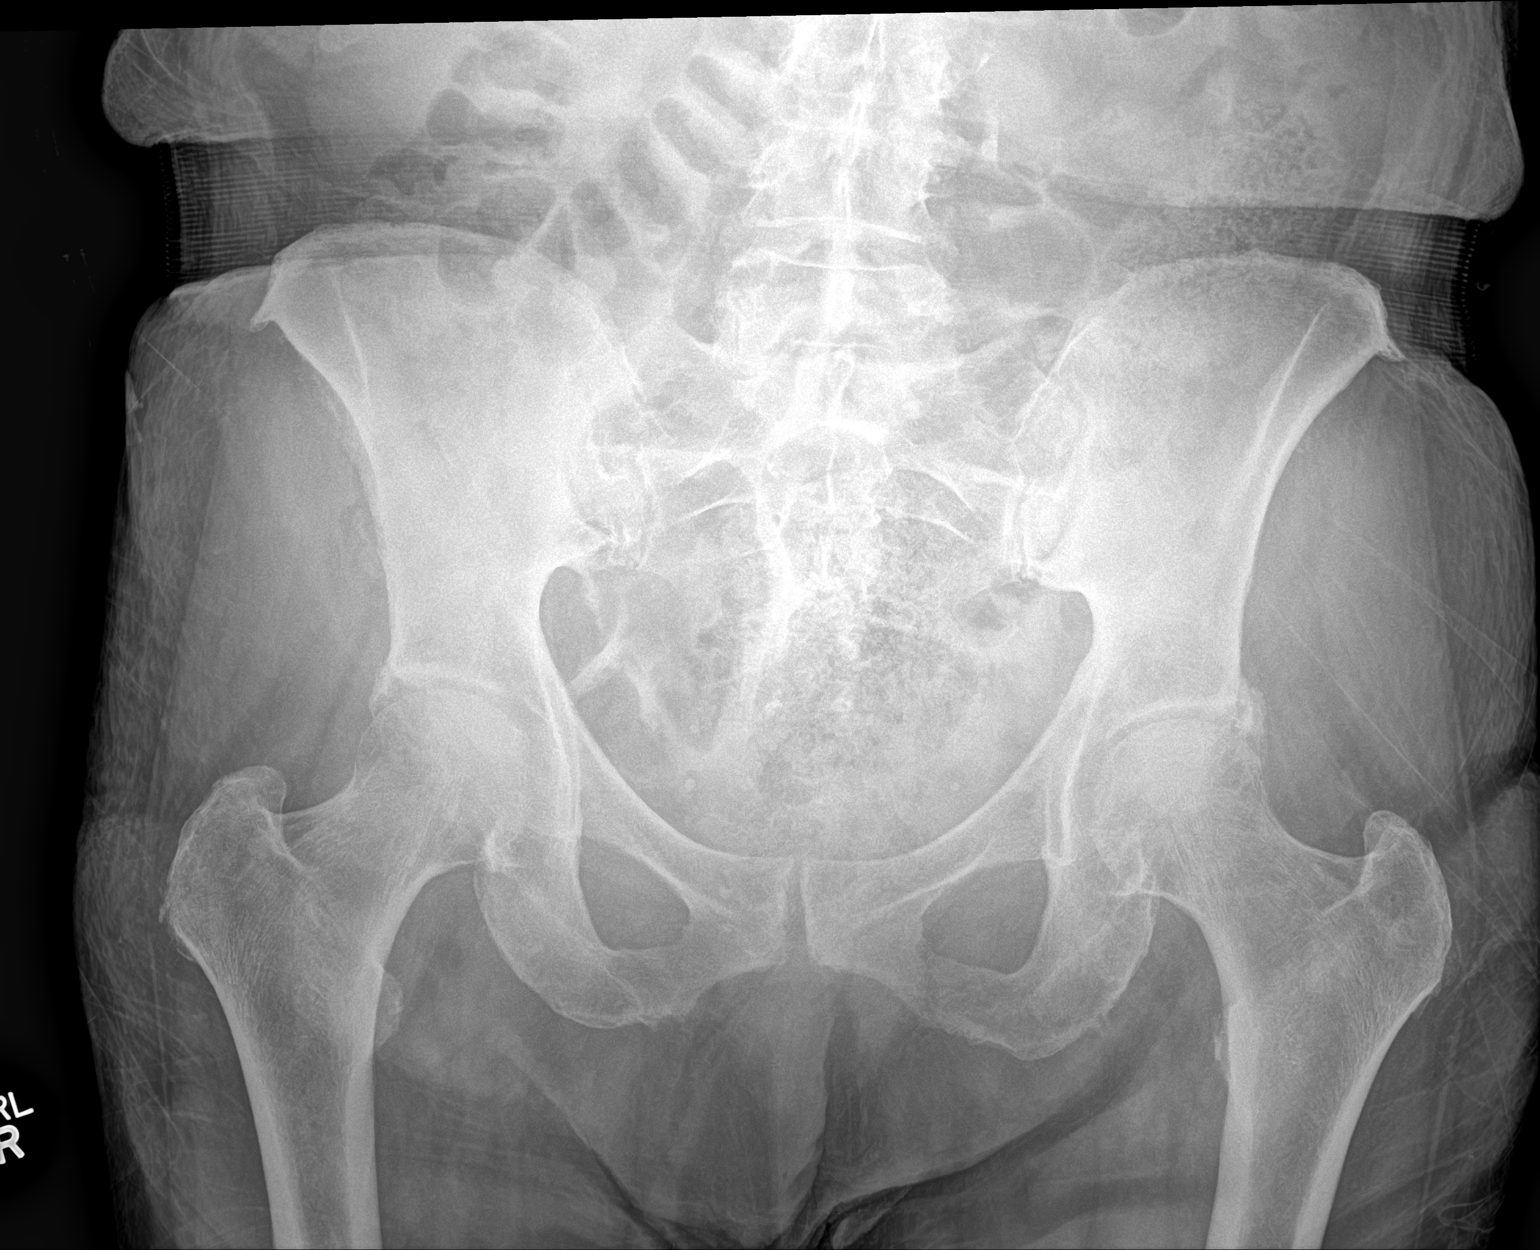

[1 of 1 positions shown; findings below may reference images not displayed]

FINDINGS: There is no acute fracture or dislocation. The bones are osteopenic.
Moderate bilateral hip arthritic changes. The soft tissues are
grossly unremarkable.
IMPRESSION: No acute fracture or dislocation.

## 2021-05-21 IMAGING — CT CT HEAD W/O CM
4 of 5 series · 15 of 47 positions shown, 17 images · non-contrast
Comparison: None.

CLINICAL DATA: Fall

EXAM:
CT HEAD WITHOUT CONTRAST
CT CERVICAL SPINE WITHOUT CONTRAST
TECHNIQUE: Multidetector CT imaging of the head and cervical spine was
performed following the standard protocol without intravenous
contrast. Multiplanar CT image reconstructions of the cervical spine
were also generated.

[Series 2: head wo · axial · 0.42mm/px · z∈[-382,-287]mm · 5 of 29 slices shown, 7 images]
[im 5/29  brain]
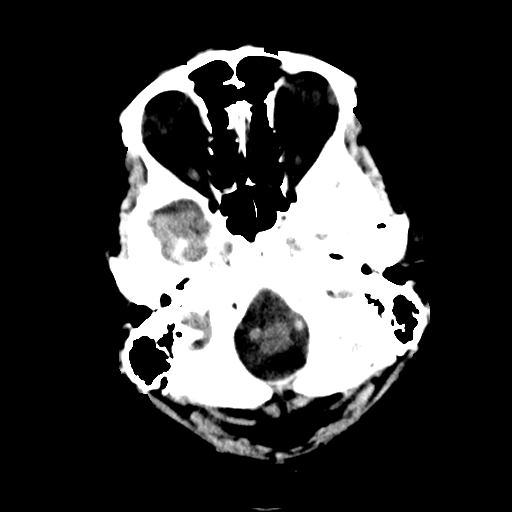
[im 5/29  bone]
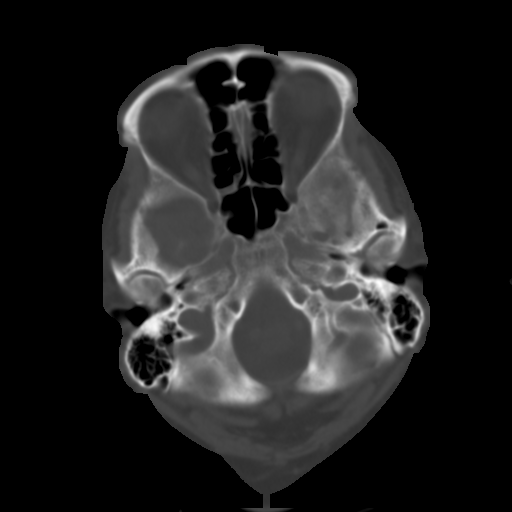
[im 10/29  brain]
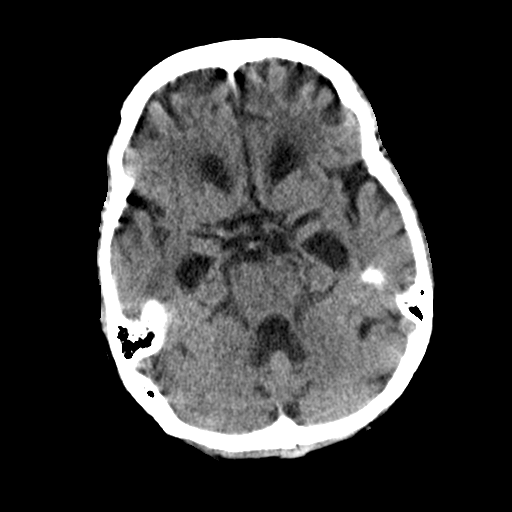
[im 15/29  brain]
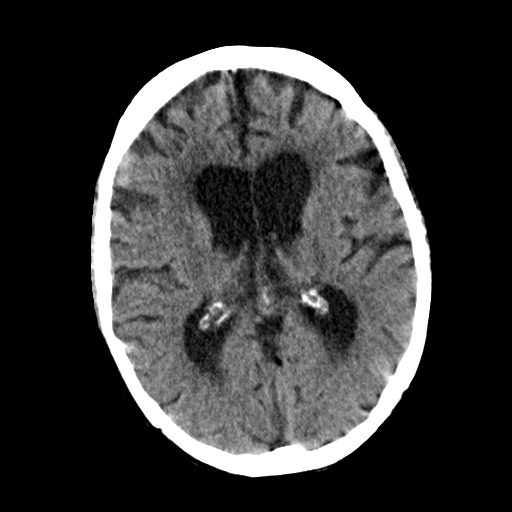
[im 19/29  brain]
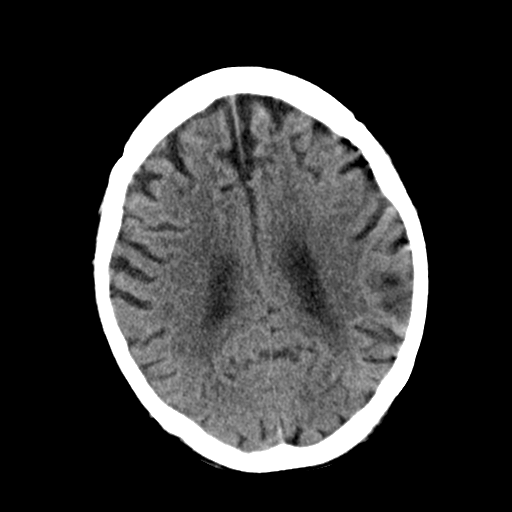
[im 24/29  brain]
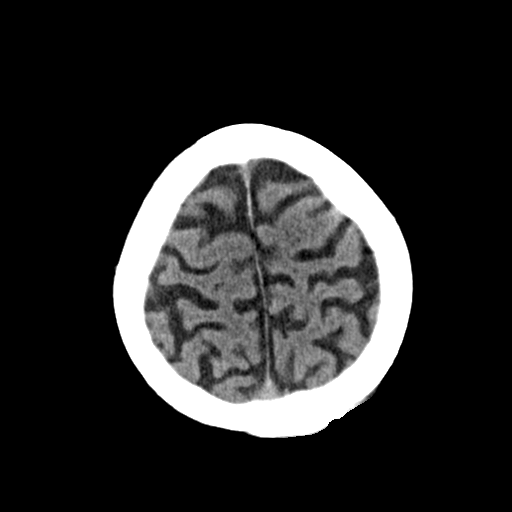
[im 24/29  bone]
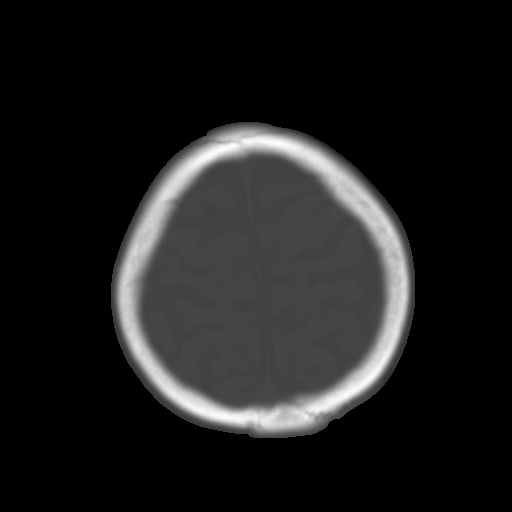

[Series 4: head wo ax · axial · 0.35mm/px · z∈[-428,-356]mm · 4 of 32 slices shown]
[im 6/32  brain]
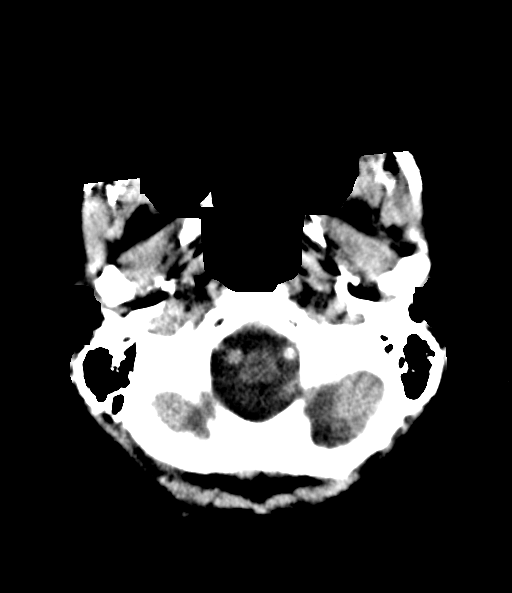
[im 11/32  brain]
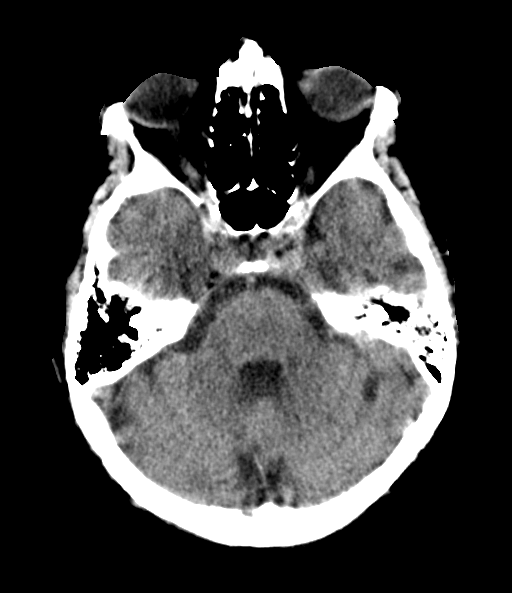
[im 16/32  brain]
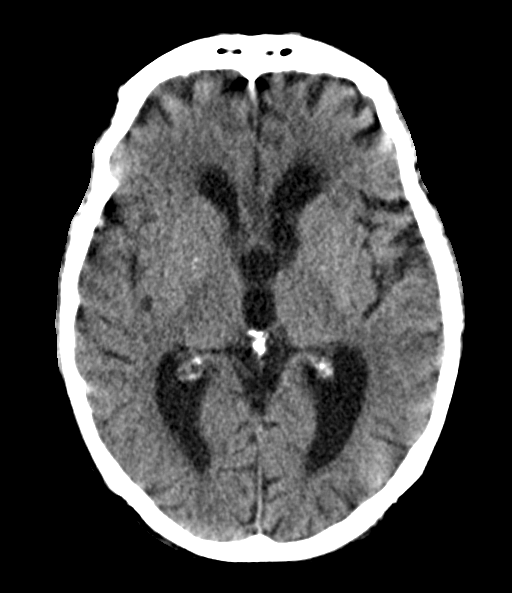
[im 21/32  brain]
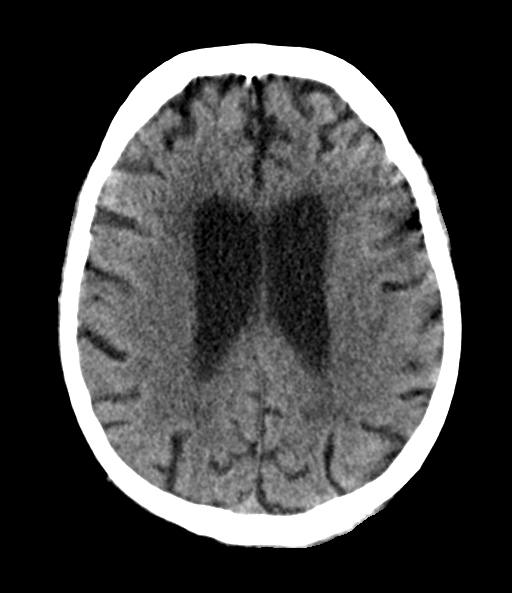

[Series 5: coronal soft · coronal · 0.33mm/px · 3 of 66 slices shown]
[im 22/66  brain]
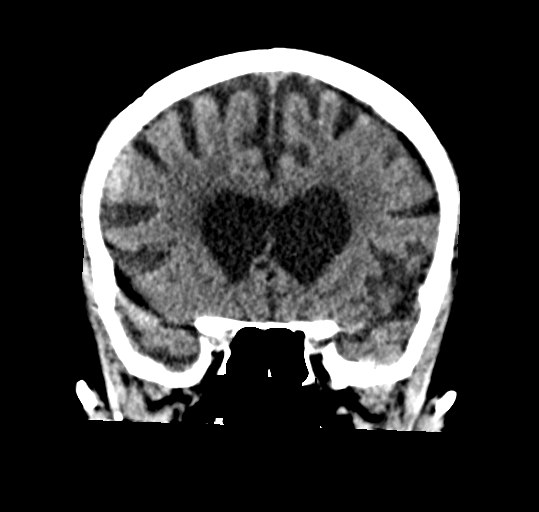
[im 29/66  brain]
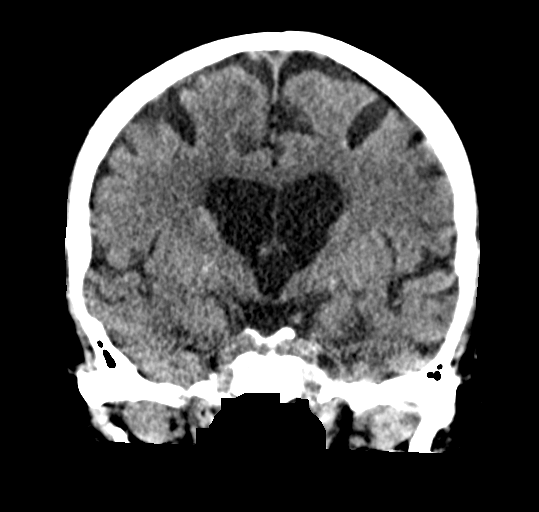
[im 37/66  brain]
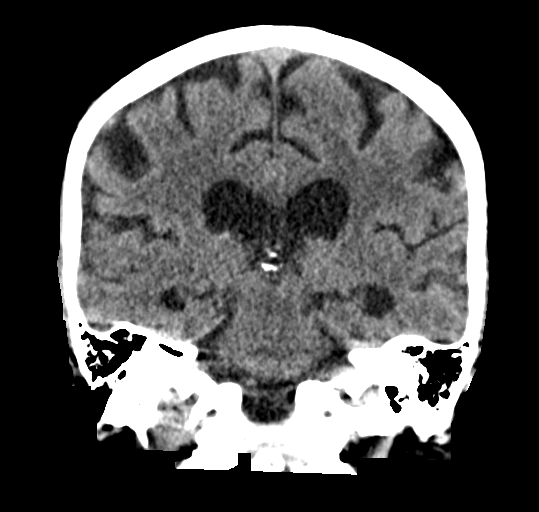

[Series 6: sagittal soft · sagittal · 0.33mm/px · 3 of 52 slices shown]
[im 18/52  brain]
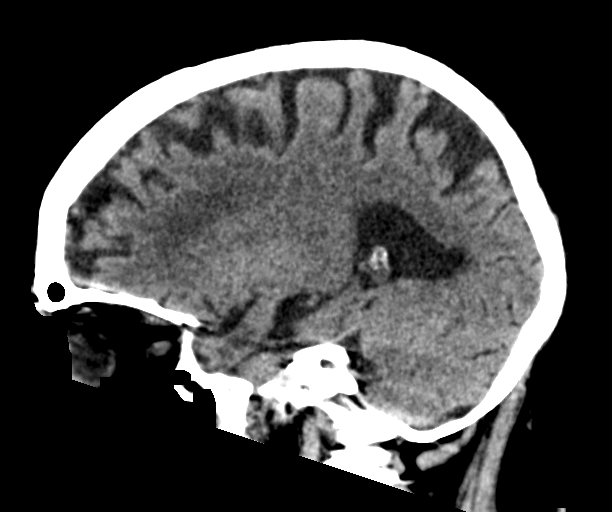
[im 26/52  brain]
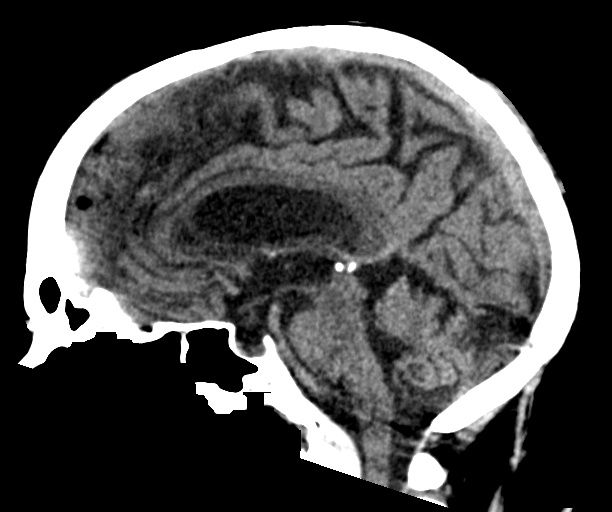
[im 35/52  brain]
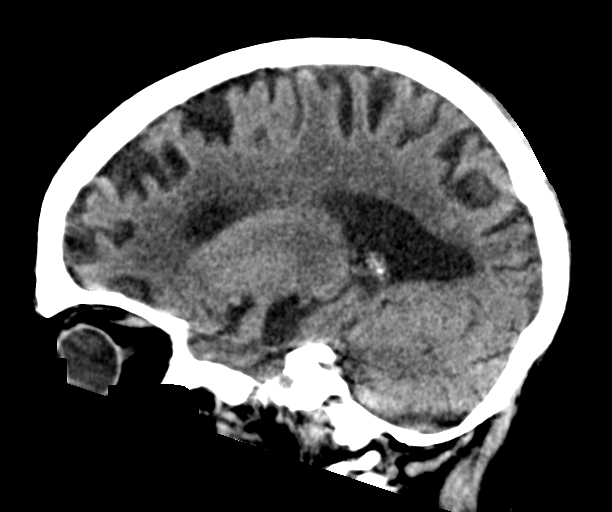

[15 of 47 positions shown; findings below may reference images not displayed]

FINDINGS: CT HEAD FINDINGS

Brain: There is no mass, hemorrhage or extra-axial collection. There
is generalized atrophy without lobar predilection. There is
hypoattenuation of the periventricular white matter, most commonly
indicating chronic ischemic microangiopathy.

Vascular: No abnormal hyperdensity of the major intracranial
arteries or dural venous sinuses. No intracranial atherosclerosis.

Skull: The visualized skull base, calvarium and extracranial soft
tissues are normal.

Sinuses/Orbits: No fluid levels or advanced mucosal thickening of
the visualized paranasal sinuses. No mastoid or middle ear effusion.
The orbits are normal.

CT CERVICAL SPINE FINDINGS

Alignment: Mild posterior subluxation C1 on C2.

Skull base and vertebrae: Mildly displaced type 2 fracture of the
dens, of uncertain age.

Soft tissues and spinal canal: No prevertebral fluid or swelling. No
visible canal hematoma.

Disc levels: Moderate upper cervical left facet arthrosis.

Upper chest: No pneumothorax, pulmonary nodule or pleural effusion.

Other: Normal visualized paraspinal cervical soft tissues.
IMPRESSION: 1. Chronic ischemic microangiopathy and generalized atrophy without
acute intracranial abnormality.
2. Mildly displaced type 2 fracture of the dens with mild posterior
subluxation C1 on C2. Acuity is uncertain. MRI recommended for
further characterization.

Critical Value/emergent results were called by telephone at the time
of interpretation on [DATE] at [DATE] to provider BARRAZA
, who verbally acknowledged these results.

## 2021-05-21 MED ORDER — ONDANSETRON HCL 4 MG PO TABS: 4.0000 mg | ORAL_TABLET | Freq: Four times a day (QID) | ORAL | Status: DC | PRN

## 2021-05-21 MED ORDER — ACETAMINOPHEN 325 MG PO TABS: 650.0000 mg | ORAL_TABLET | Freq: Four times a day (QID) | ORAL | Status: DC | PRN

## 2021-05-21 MED ORDER — FLUOXETINE HCL 10 MG PO CAPS
20.0000 mg | ORAL_CAPSULE | Freq: Every day | ORAL | Status: DC
  Administered 2021-05-21 – 2021-05-27 (×7): 20 mg via ORAL
  Filled 2021-05-21 (×7): qty 2

## 2021-05-21 MED ORDER — PANTOPRAZOLE SODIUM 40 MG PO TBEC
40.0000 mg | DELAYED_RELEASE_TABLET | Freq: Every day | ORAL | Status: DC
  Administered 2021-05-21 – 2021-05-27 (×7): 40 mg via ORAL
  Filled 2021-05-21 (×7): qty 1

## 2021-05-21 MED ORDER — DONEPEZIL HCL 10 MG PO TABS: 10.0000 mg | ORAL_TABLET | Freq: Every day | ORAL | Status: DC

## 2021-05-21 MED ORDER — FUROSEMIDE 40 MG PO TABS
40.0000 mg | ORAL_TABLET | Freq: Every day | ORAL | Status: DC
  Administered 2021-05-21: 40 mg via ORAL
  Filled 2021-05-21: qty 1

## 2021-05-21 MED ORDER — TRAZODONE HCL 50 MG PO TABS
50.0000 mg | ORAL_TABLET | Freq: Every day | ORAL | Status: DC
  Administered 2021-05-21 – 2021-05-26 (×6): 50 mg via ORAL
  Filled 2021-05-21 (×6): qty 1

## 2021-05-21 MED ORDER — IRBESARTAN 150 MG PO TABS
300.0000 mg | ORAL_TABLET | Freq: Every day | ORAL | Status: DC
  Administered 2021-05-21 – 2021-05-27 (×7): 300 mg via ORAL
  Filled 2021-05-21 (×7): qty 2

## 2021-05-21 MED ORDER — HYDRALAZINE HCL 20 MG/ML IJ SOLN: 5.0000 mg | INTRAMUSCULAR | Status: DC | PRN

## 2021-05-21 MED ORDER — MELATONIN 5 MG PO TABS
10.0000 mg | ORAL_TABLET | Freq: Every day | ORAL | Status: DC
  Administered 2021-05-21 – 2021-05-26 (×6): 10 mg via ORAL
  Filled 2021-05-21 (×6): qty 2

## 2021-05-21 MED ORDER — ALBUTEROL SULFATE (2.5 MG/3ML) 0.083% IN NEBU: 2.5000 mg | INHALATION_SOLUTION | Freq: Four times a day (QID) | RESPIRATORY_TRACT | Status: DC | PRN

## 2021-05-21 MED ORDER — DICLOFENAC SODIUM 1 % EX GEL
4.0000 g | Freq: Four times a day (QID) | CUTANEOUS | Status: DC | PRN
  Filled 2021-05-21: qty 100

## 2021-05-21 MED ORDER — HYDRALAZINE HCL 20 MG/ML IJ SOLN
5.0000 mg | INTRAMUSCULAR | Status: DC | PRN
  Administered 2021-05-21: 10 mg via INTRAVENOUS
  Filled 2021-05-21: qty 1

## 2021-05-21 MED ORDER — ACETAMINOPHEN 650 MG RE SUPP: 650.0000 mg | Freq: Four times a day (QID) | RECTAL | Status: DC | PRN

## 2021-05-21 MED ORDER — SODIUM CHLORIDE 0.9% FLUSH
3.0000 mL | Freq: Two times a day (BID) | INTRAVENOUS | Status: DC
  Administered 2021-05-21 – 2021-05-27 (×13): 3 mL via INTRAVENOUS

## 2021-05-21 MED ORDER — ONDANSETRON HCL 4 MG/2ML IJ SOLN: 4.0000 mg | Freq: Four times a day (QID) | INTRAMUSCULAR | Status: DC | PRN

## 2021-05-21 MED ORDER — LEVOTHYROXINE SODIUM 25 MCG PO TABS
25.0000 ug | ORAL_TABLET | Freq: Every day | ORAL | Status: DC
  Administered 2021-05-21 – 2021-05-27 (×7): 25 ug via ORAL
  Filled 2021-05-21 (×8): qty 1

## 2021-05-21 MED ORDER — ENOXAPARIN SODIUM 40 MG/0.4ML IJ SOSY
40.0000 mg | PREFILLED_SYRINGE | INTRAMUSCULAR | Status: DC
Start: 2021-05-21 — End: 2021-05-23
  Administered 2021-05-21 – 2021-05-22 (×2): 40 mg via SUBCUTANEOUS
  Filled 2021-05-21 (×2): qty 0.4

## 2021-05-21 NOTE — H&P (Signed)
History and Physical    April Stevenson [id number] DOB: Aug 14, 1924 DOA: 05/21/2021  Referring MD/NP/PA: Carlynn Purl, MD PCP: Sandrea Hughs, NP  Patient coming from: Transfer from [place]  Chief Complaint: Fall  I have personally briefly reviewed patient's old medical records in [place]   HPI: April Stevenson is a 87 y.o. female with medical history significant of hypertension, hyperlipidemia, hypothyroidism, pulmonary hypertension, Alzheimer's dementia presents after having an unwitnessed fall.  History is obtained from the patient's son over the phone and review of records.  This morning she had been found on the floor by her bedroom after attempting to go to the bathroom on her own.  The patient has significant dementia and is only oriented to herself, but can normally ambulate without assistance.  She had been living at home with her son for the last 14 months, but had been temporarily moved into [facility] while her son is currently undergoing treatment for leukemia.  The patient herself denies any pain complaints at this time, but cannot tell me why she is here.  Son confirms that the patient has DNR in place.    ED Course: Upon admission into the emergency department patient was seen to be pulse anywhere from 37-58, respiration has 16-26, and blood pressures maintained.  Patient was noted to have intermittent dips of her O2 saturations to the 80s, but this was thought secondary to to nail polish and you are fidgeting with pulse oximetry.  CT scan of the head and cervical spine was significant for mildly displaced type II fracture of the dens and anterior posterior subluxation of C1 on C2.  Labs significant for WBC 11.7, CO2 33, BUN 25, and creatinine 1.1.  Chest x-ray was otherwise clear and x-rays of her pelvis did not note any acute fracture or dislocation.  Influenza and COVID-19 screening was negative.  Review of Systems  Unable to perform  ROS: Dementia   Past Medical History:  Diagnosis Date   Benign essential tremor 07/18/2018   Bilateral nonexudative age-related macular degeneration 07/18/2018   Cholestatic hepatitis    2001   DDD (degenerative disc disease), lumbar 04/23/2018   Depression    Dry eyes, bilateral 07/18/2018   Fatigue    GERD (gastroesophageal reflux disease)    Hiatal hernia with GERD 07/29/2011   Hoarding behavior 04/23/2018   HTN (hypertension) 07/29/2011   Hyperlipidemia    Hypertension    Hypothyroidism 07/29/2011   Insomnia    Late onset Alzheimer's disease with behavioral disturbance ([place]) 04/23/2018   LBBB (left bundle branch block) 07/29/2011   Major depressive disorder 07/29/2016   Memory impairment 12/09/2016   Memory loss    Osteoporosis    Paranoid delusion ([place]) 07/18/2018   Psychophysiological insomnia 04/23/2018   Pulmonary HTN ([place]) 03/15/2019   2 d echo 02/28/19 EF 55-60 %, mild to moderate mitral valve regurg, mild tricuspid regurg, severely elevated estimated right ventricular systolic pressure 85 mm Hg, and mild to moderate aortic valve regurg. No diastolic CHF noted.    Pure hypercholesterolemia 07/29/2011   Sciatica    Senile osteoporosis 04/23/2018    Past Surgical History:  Procedure Laterality Date   APPENDECTOMY     CARDIAC CATHETERIZATION     FACIAL COSMETIC SURGERY     TONSILLECTOMY     TOTAL ABDOMINAL HYSTERECTOMY W/ BILATERAL SALPINGOOPHORECTOMY       reports that she has quit smoking. Her smoking use included cigarettes. She has a 10.00 pack-year smoking history. She has never  used smokeless tobacco. She reports current alcohol use of about 1.0 standard drink of alcohol per week. She reports that she does not use drugs.  Allergies  Allergen Reactions   Garlic    Lac Bovis    Lanolin    Penicillins     ? rash   Statins     myalgia    Family History  Problem Relation Age of Onset   Heart attack Father     Prior to Admission medications   Medication Sig Start  Date End Date Taking? Authorizing Provider  Calcium Carbonate-Vitamin D 600-400 MG-UNIT chew tablet Chew 1 tablet by mouth 3 (three) times daily with meals. 04/18/18   Reed, Tiffany L, DO  Cholecalciferol (D3 HIGH POTENCY) 2000 units CAPS Take 1 capsule (2,000 Units total) by mouth daily. 04/18/18   Reed, Tiffany L, DO  diclofenac Sodium (VOLTAREN) 1 % GEL Apply 4 g topically 4 (four) times daily as needed. 01/21/21   Hilts, Legrand Como, MD  donepezil (ARICEPT) 10 MG tablet TAKE 1 TABLET(10 MG) BY MOUTH AT BEDTIME 04/15/21   Ngetich, Dinah C, NP  FLUoxetine (PROZAC) 20 MG tablet TAKE 1 TABLET(20 MG) BY MOUTH DAILY 04/22/21   Ngetich, Dinah C, NP  furosemide (LASIX) 40 MG tablet TAKE 1 TABLET(40 MG) BY MOUTH DAILY 04/22/21   Ngetich, Dinah C, NP  irbesartan (AVAPRO) 300 MG tablet TAKE 1 TABLET(300 MG) BY MOUTH DAILY 04/15/21   Ngetich, Dinah C, NP  levothyroxine (SYNTHROID) 25 MCG tablet TAKE 1 TABLET(25 MCG) BY MOUTH AT BEDTIME 04/15/21   Ngetich, Dinah C, NP  Melatonin 10 MG TABS Take 1 tablet by mouth at bedtime. 01/31/20   Reed, Tiffany L, DO  methocarbamol (ROBAXIN) 500 MG tablet Take 0.5 tablets (250 mg total) by mouth every 8 (eight) hours as needed for muscle spasms. 01/14/21   Ngetich, Dinah C, NP  Multiple Vitamins-Minerals (PRESERVISION AREDS 2) CAPS Take 1 capsule by mouth 2 (two) times daily.    [provider]  omeprazole (PRILOSEC) 20 MG capsule TAKE 1 CAPSULE(20 MG) BY MOUTH DAILY 04/15/21   Ngetich, Dinah C, NP  potassium chloride SA (KLOR-CON) 20 MEQ tablet TAKE 1 TABLET BY MOUTH EVERY DAY 04/15/21   Ngetich, Dinah C, NP  propranolol ER (INDERAL LA) 60 MG 24 hr capsule TAKE 1 CAPSULE(60 MG) BY MOUTH DAILY 04/22/21   Ngetich, Dinah C, NP  traZODone (DESYREL) 50 MG tablet TAKE 1 TABLET(50 MG) BY MOUTH AT BEDTIME 04/15/21   Ngetich, Dinah C, NP  vitamin C (ASCORBIC ACID) 500 MG tablet Take 1 tablet (500 mg total) by mouth daily. 07/18/18   Gayland Curry, DO    Physical Exam:  Constitutional:  Elderly female currently in no acute distress Vitals:   05/21/21 0400 05/21/21 0718 05/21/21 0900 05/21/21 1215  BP: (!) 150/75 (!) 183/81 (!) 179/67 (!) 126/51  Pulse: (!) 45 (!) 37 (!) 56 (!) 58  Resp: 17 18 (!) 26 16  Temp: 97.8 F (36.6 C)     TempSrc:      SpO2: 95% 90% 92% 92%  Weight:      Height:       Eyes: PERRL, lids and conjunctivae normal ENMT: Mucous membranes are moist.  Neck: Cervical collar in place Respiratory: clear to auscultation bilaterally, no wheezing, no crackles. Normal respiratory effort. No accessory muscle use.  Cardiovascular: Bradycardic.  No extremity edema. 2+ pedal pulses. No carotid bruits.  Abdomen: no tenderness, no masses palpated. No hepatosplenomegaly. Bowel sounds positive.  Musculoskeletal: no clubbing / cyanosis. No joint deformity upper and lower extremities. Good ROM, no contractures. Normal muscle tone.  Skin: no rashes, lesions, ulcers. No induration Neurologic: CN 2-12 grossly intact.  Able to move all extremities psychiatric: Patient alert and oriented only to self.  Not oriented to time, place, or situation    Labs on Admission: I have personally reviewed following labs and imaging studies  CBC: Recent Labs  Lab 05/21/21 0358  WBC 11.7*  NEUTROABS 9.7*  HGB 12.3  HCT 37.2  MCV 94.4  PLT [id number]   Basic Metabolic Panel: Recent Labs  Lab 05/21/21 0358  NA 140  K 4.1  CL 98  CO2 33*  GLUCOSE 115*  BUN 25*  CREATININE 1.10*  CALCIUM 9.3   GFR: Estimated Creatinine Clearance: 31 mL/min (A) (by C-G formula based on SCr of 1.1 mg/dL (H)). Liver Function Tests: Recent Labs  Lab 05/21/21 0358  AST 25  ALT 12  ALKPHOS 105  BILITOT 0.8  PROT 6.6  ALBUMIN 3.5   No results for input(s): LIPASE, AMYLASE in the last 168 hours. No results for input(s): AMMONIA in the last 168 hours. Coagulation Profile: No results for input(s): INR, PROTIME in the last 168 hours. Cardiac Enzymes: No results for input(s): CKTOTAL, CKMB,  CKMBINDEX, TROPONINI in the last 168 hours. BNP (last 3 results) No results for input(s): PROBNP in the last 8760 hours. HbA1C: No results for input(s): HGBA1C in the last 72 hours. CBG: No results for input(s): GLUCAP in the last 168 hours. Lipid Profile: No results for input(s): CHOL, HDL, LDLCALC, TRIG, CHOLHDL, LDLDIRECT in the last 72 hours. Thyroid Function Tests: No results for input(s): TSH, T4TOTAL, FREET4, T3FREE, THYROIDAB in the last 72 hours. Anemia Panel: No results for input(s): VITAMINB12, FOLATE, FERRITIN, TIBC, IRON, RETICCTPCT in the last 72 hours. Urine analysis:    Component Value Date/Time   BILIRUBINUR Negative 01/14/2021 1221   PROTEINUR Negative 01/14/2021 1221   UROBILINOGEN 0.2 01/14/2021 1221   NITRITE Negative 01/14/2021 1221   LEUKOCYTESUR Moderate (2+) (A) 01/14/2021 1221   Sepsis Labs: Recent Results (from the past 240 hour(s))  Resp Panel by RT-PCR (Flu A&B, Covid) Nasopharyngeal Swab     Status: None   Collection Time: 05/21/21  4:51 AM   Specimen: Nasopharyngeal Swab; Nasopharyngeal(NP) swabs in vial transport medium  Result Value Ref Range Status   SARS Coronavirus 2 by RT PCR NEGATIVE NEGATIVE Final    Comment: (NOTE) SARS-CoV-2 target nucleic acids are NOT DETECTED.  The SARS-CoV-2 RNA is generally detectable in upper respiratory specimens during the acute phase of infection. The lowest concentration of SARS-CoV-2 viral copies this assay can detect is 138 copies/mL. A negative result does not preclude SARS-Cov-2 infection and should not be used as the sole basis for treatment or other patient management decisions. A negative result may occur with  improper specimen collection/handling, submission of specimen other than nasopharyngeal swab, presence of viral mutation(s) within the areas targeted by this assay, and inadequate number of viral copies(<138 copies/mL). A negative result must be combined with clinical observations, patient  history, and epidemiological information. The expected result is Negative.  Fact Sheet for Patients:  EntrepreneurPulse.com.au  Fact Sheet for Healthcare Providers:  IncredibleEmployment.be  This test is no t yet approved or cleared by the [place] FDA and  has been authorized for detection and/or diagnosis of SARS-CoV-2 by FDA under an Emergency Use Authorization (EUA). This EUA will remain  in effect (meaning this test can  be used) for the duration of the COVID-19 declaration under Section 564(b)(1) of the Act, 21 U.S.C.section 360bbb-3(b)(1), unless the authorization is terminated  or revoked sooner.       Influenza A by PCR NEGATIVE NEGATIVE Final   Influenza B by PCR NEGATIVE NEGATIVE Final    Comment: (NOTE) The Xpert Xpress SARS-CoV-2/FLU/RSV plus assay is intended as an aid in the diagnosis of influenza from Nasopharyngeal swab specimens and should not be used as a sole basis for treatment. Nasal washings and aspirates are unacceptable for Xpert Xpress SARS-CoV-2/FLU/RSV testing.  Fact Sheet for Patients: EntrepreneurPulse.com.au  Fact Sheet for Healthcare Providers: IncredibleEmployment.be  This test is not yet approved or cleared by the [place] FDA and has been authorized for detection and/or diagnosis of SARS-CoV-2 by FDA under an Emergency Use Authorization (EUA). This EUA will remain in effect (meaning this test can be used) for the duration of the COVID-19 declaration under Section 564(b)(1) of the Act, 21 U.S.C. section 360bbb-3(b)(1), unless the authorization is terminated or revoked.  Performed at [facility], [address], [place], [place] [address]      Radiological Exams on Admission: CT HEAD WO CONTRAST (5MM)  Result Date: 05/21/2021 CLINICAL DATA:  Fall EXAM: CT HEAD WITHOUT CONTRAST CT CERVICAL SPINE WITHOUT CONTRAST TECHNIQUE: Multidetector  CT imaging of the head and cervical spine was performed following the standard protocol without intravenous contrast. Multiplanar CT image reconstructions of the cervical spine were also generated. COMPARISON:  None. FINDINGS: CT HEAD FINDINGS Brain: There is no mass, hemorrhage or extra-axial collection. There is generalized atrophy without lobar predilection. There is hypoattenuation of the periventricular white matter, most commonly indicating chronic ischemic microangiopathy. Vascular: No abnormal hyperdensity of the major intracranial arteries or dural venous sinuses. No intracranial atherosclerosis. Skull: The visualized skull base, calvarium and extracranial soft tissues are normal. Sinuses/Orbits: No fluid levels or advanced mucosal thickening of the visualized paranasal sinuses. No mastoid or middle ear effusion. The orbits are normal. CT CERVICAL SPINE FINDINGS Alignment: Mild posterior subluxation C1 on C2. Skull base and vertebrae: Mildly displaced type 2 fracture of the dens, of uncertain age. Soft tissues and spinal canal: No prevertebral fluid or swelling. No visible canal hematoma. Disc levels: Moderate upper cervical left facet arthrosis. Upper chest: No pneumothorax, pulmonary nodule or pleural effusion. Other: Normal visualized paraspinal cervical soft tissues. IMPRESSION: 1. Chronic ischemic microangiopathy and generalized atrophy without acute intracranial abnormality. 2. Mildly displaced type 2 fracture of the dens with mild posterior subluxation C1 on C2. Acuity is uncertain. MRI recommended for further characterization. Critical Value/emergent results were called by telephone at the time of interpretation on 05/21/2021 at 3:32 am to provider [facility] , who verbally acknowledged these results. Electronically Signed   By: Ulyses Jarred M.D.   On: 05/21/2021 03:37   CT Cervical Spine Wo Contrast  Result Date: 05/21/2021 CLINICAL DATA:  Fall EXAM: CT HEAD WITHOUT CONTRAST CT CERVICAL  SPINE WITHOUT CONTRAST TECHNIQUE: Multidetector CT imaging of the head and cervical spine was performed following the standard protocol without intravenous contrast. Multiplanar CT image reconstructions of the cervical spine were also generated. COMPARISON:  None. FINDINGS: CT HEAD FINDINGS Brain: There is no mass, hemorrhage or extra-axial collection. There is generalized atrophy without lobar predilection. There is hypoattenuation of the periventricular white matter, most commonly indicating chronic ischemic microangiopathy. Vascular: No abnormal hyperdensity of the major intracranial arteries or dural venous sinuses. No intracranial atherosclerosis. Skull: The visualized skull base, calvarium and extracranial soft  tissues are normal. Sinuses/Orbits: No fluid levels or advanced mucosal thickening of the visualized paranasal sinuses. No mastoid or middle ear effusion. The orbits are normal. CT CERVICAL SPINE FINDINGS Alignment: Mild posterior subluxation C1 on C2. Skull base and vertebrae: Mildly displaced type 2 fracture of the dens, of uncertain age. Soft tissues and spinal canal: No prevertebral fluid or swelling. No visible canal hematoma. Disc levels: Moderate upper cervical left facet arthrosis. Upper chest: No pneumothorax, pulmonary nodule or pleural effusion. Other: Normal visualized paraspinal cervical soft tissues. IMPRESSION: 1. Chronic ischemic microangiopathy and generalized atrophy without acute intracranial abnormality. 2. Mildly displaced type 2 fracture of the dens with mild posterior subluxation C1 on C2. Acuity is uncertain. MRI recommended for further characterization. Critical Value/emergent results were called by telephone at the time of interpretation on 05/21/2021 at 3:32 am to provider [facility] , who verbally acknowledged these results. Electronically Signed   By: Ulyses Jarred M.D.   On: 05/21/2021 03:37   DG Pelvis Portable  Result Date: 05/21/2021 CLINICAL DATA:  Fall. EXAM:  PORTABLE PELVIS 1-2 VIEWS COMPARISON:  None. FINDINGS: There is no acute fracture or dislocation. The bones are osteopenic. Moderate bilateral hip arthritic changes. The soft tissues are grossly unremarkable. IMPRESSION: No acute fracture or dislocation. Electronically Signed   By: Anner Crete M.D.   On: 05/21/2021 03:23   DG Chest Portable 1 View  Result Date: 05/21/2021 CLINICAL DATA:  Fall and hypoxia EXAM: PORTABLE CHEST 1 VIEW COMPARISON:  None. FINDINGS: No focal consolidation, pleural effusion, or pneumothorax. Mild cardiomegaly. Atherosclerotic calcification of the aorta. Small hiatal hernia. No acute osseous pathology. IMPRESSION: No acute cardiopulmonary process. Electronically Signed   By: Anner Crete M.D.   On: 05/21/2021 03:22    EKG: Independently reviewed.  Sinus bradycardia at 47 bpm with left bundle branch block.  Assessment/Plan Fall/Syncope: Acute.  Patient presents after having a unwitnessed fall at the nursing facility.  At baseline patient does not usually use a walker or assistive aid to get around.  Unclear if patient had a mechanical fall or had a syncopal episode as patient was noted to be bradycardic, but blood pressures appear to be maintained. -Admit to a cardiac telemetry bed -Check orthostatic vital signs -Holding medications that could lower heart rates -Follow-up telemetry overnight   Dens type II fracture:  Patient was noted to have a dens type II fracture of the cervical spine that was seen on initial imaging of unclear age.   -Continue cervical collar -Follow-up MRI of the cervical spine without contrast -Depending on MRI rediscussed with neurosurgery in a.m. for any additional recommendations  Leukocytosis: Acute.  WBC elevated 11.7.  Chest x-ray was otherwise noted to be clear.  Patient was otherwise noted to be afebrile.  Possibly secondary to fracture.  -Check urinalysis -Recheck CBC tomorrow morning  Sinus bradycardia: Acute.  On admission  patient's heart rates were noted to be anywhere from 37-58, but blood pressures are currently maintained.  Home blood pressure medications include propranolol 60 mg daily, irbesartan 300 mg daily, and furosemide 40 mg daily. -Held propranolol due to bradycardia -Follow-up telemetry overnight and give atropine if needed for signs of hemodynamic instability  Dementia, with behavioral disturbance: Patient has significant dementia and is only oriented to self.  While in the hospital patient was noted to be getting up out of bed and is at risk of falls. -Delirium precaution -Sitter to bedside -Hold Aricept as it could possibly worsen bradycardia  Essential hypertension: Blood pressures  were noted to be elevated up to 183/81.  Home blood pressure medications include furosemide 40 mg daily, irbesartan 300 mg daily, and propranolol 60 mg daily. -Continue Irbesartan -Held furosemide due to possible dehydration with elevated BUN to creatinine ratio -Held propranolol due to bradycardia -Hydralazine IV as needed for elevated systolic blood pressure greater than 180  Hypothyroidism -Check TSH (2.389) -Continue levothyroxine  Chronic kidney disease stage IIIa: On admission creatinine 1.1 which appears near patient's baseline -Continue to monitor  GERD -Continue pharmacy substitution of Protonix  DNR: Present on admission  DVT prophylaxis: Lovenox Code Status: DNR Family Communication: Son updated over the phone Disposition Plan: To be determined Consults called: None Admission status: Observation  Norval Morton MD [facility]   If 7PM-7AM, please contact night-coverage   05/21/2021, 1:23 PM

## 2021-05-21 NOTE — ED Notes (Signed)
Patient cleaned, dressed into gown and personal belongings placed in belongings bag/labeled.  Given warm blankets and placed on pur wick for urinary incontinence   One pair of eye glasses One pair of yellow hoop earrings. One Leda Quail watch. One blue and green bractlet (on patients wrist) Shirt, pants, and one pair of sock.

## 2021-05-21 NOTE — ED Triage Notes (Signed)
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  Patient has a History of Dementia and does not recall the Fall. Patient was found on Floor in Bedroom by Magnet Door. Patient has abrasion to Posterior Head. Patient complaining of Pain to Head.   Patient is A&Ox3. GCS 14 per Baseline. C-Collar in Place. No Hx of Blood Thinners.

## 2021-05-21 NOTE — Progress Notes (Signed)
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 Will continue to follow for disposition.  Please call with any outpatient palliative questions or concerns.  Thank you, Lorelee Market, LPN [facility] Liaison [phone]

## 2021-05-21 NOTE — ED Provider Notes (Signed)
[facility] EMERGENCY DEPT Provider Note   CSN: [id number] Arrival date & time: 05/21/21  0226     History No chief complaint on file.   April Stevenson is a 87 y.o. female.  Level 5 caveat for dementia.  Patient from living facility after unwitnessed fall.  She does not remember what happened.  She reportedly was found on the floor by her bedroom after attempting to go to the bathroom on her own.  She walks without any assistive devices.  Complains of pain to her head where she has an abrasion and hematoma.  Denies any neck or back pain.  Denies any chest or abdominal pain.  Does not recall falling.  Denies any dizziness.  No blood thinner use. Pelvis stable.  At baseline mental status per report. Arm tremors at baseline  The history is provided by the patient and the EMS personnel. The history is limited by the condition of the patient.  Fall      Past Medical History:  Diagnosis Date   Benign essential tremor 07/18/2018   Bilateral nonexudative age-related macular degeneration 07/18/2018   Cholestatic hepatitis    2001   DDD (degenerative disc disease), lumbar 04/23/2018   Depression    Dry eyes, bilateral 07/18/2018   Fatigue    GERD (gastroesophageal reflux disease)    Hiatal hernia with GERD 07/29/2011   Hoarding behavior 04/23/2018   HTN (hypertension) 07/29/2011   Hyperlipidemia    Hypertension    Hypothyroidism 07/29/2011   Insomnia    Late onset Alzheimer's disease with behavioral disturbance ([place]) 04/23/2018   LBBB (left bundle branch block) 07/29/2011   Major depressive disorder 07/29/2016   Memory impairment 12/09/2016   Memory loss    Osteoporosis    Paranoid delusion ([place]) 07/18/2018   Psychophysiological insomnia 04/23/2018   Pulmonary HTN ([place]) 03/15/2019   2 d echo 02/28/19 EF 55-60 %, mild to moderate mitral valve regurg, mild tricuspid regurg, severely elevated estimated right ventricular systolic pressure 85 mm Hg, and mild to moderate  aortic valve regurg. No diastolic CHF noted.    Pure hypercholesterolemia 07/29/2011   Sciatica    Senile osteoporosis 04/23/2018    Patient Active Problem List   Diagnosis Date Noted   Chronic venous insufficiency 09/19/2019   Pulmonary HTN ([place]) 03/15/2019   Dry eyes, bilateral 07/18/2018   Bilateral nonexudative age-related macular degeneration 07/18/2018   Paranoid delusion ([place]) 07/18/2018   Benign essential tremor 07/18/2018   Late onset Alzheimer's disease with behavioral disturbance ([place]) 04/23/2018   Senile osteoporosis 04/23/2018   Hoarding behavior 04/23/2018   Psychophysiological insomnia 04/23/2018   DDD (degenerative disc disease), lumbar 04/23/2018   Major depressive disorder 07/29/2016   Hiatal hernia with GERD 07/29/2011   Essential hypertension 07/29/2011   Hypothyroidism 07/29/2011   LBBB (left bundle branch block) 07/29/2011   Pure hypercholesterolemia 07/29/2011    Past Surgical History:  Procedure Laterality Date   APPENDECTOMY     CARDIAC CATHETERIZATION     FACIAL COSMETIC SURGERY     TONSILLECTOMY     TOTAL ABDOMINAL HYSTERECTOMY W/ BILATERAL SALPINGOOPHORECTOMY       OB History   No obstetric history on file.     Family History  Problem Relation Age of Onset   Heart attack Father     Social History   Tobacco Use   Smoking status: Former    Packs/day: 0.50    Years: 20.00    Pack years: 10.00    Types: Cigarettes  Smokeless tobacco: Never  Vaping Use   Vaping Use: Never used  Substance Use Topics   Alcohol use: Yes    Alcohol/week: 1.0 standard drink    Types: 1 Glasses of wine per week    Comment: 5-6 times a month.   Drug use: Never    Home Medications Prior to Admission medications   Medication Sig Start Date End Date Taking? Authorizing Provider  Calcium Carbonate-Vitamin D 600-400 MG-UNIT chew tablet Chew 1 tablet by mouth 3 (three) times daily with meals. 04/18/18   Reed, Tiffany L, DO  Cholecalciferol (D3 HIGH POTENCY)  2000 units CAPS Take 1 capsule (2,000 Units total) by mouth daily. 04/18/18   Reed, Tiffany L, DO  diclofenac Sodium (VOLTAREN) 1 % GEL Apply 4 g topically 4 (four) times daily as needed. 01/21/21   Hilts, Legrand Como, MD  donepezil (ARICEPT) 10 MG tablet TAKE 1 TABLET(10 MG) BY MOUTH AT BEDTIME 04/15/21   Ngetich, Dinah C, NP  FLUoxetine (PROZAC) 20 MG tablet TAKE 1 TABLET(20 MG) BY MOUTH DAILY 04/22/21   Ngetich, Dinah C, NP  furosemide (LASIX) 40 MG tablet TAKE 1 TABLET(40 MG) BY MOUTH DAILY 04/22/21   Ngetich, Dinah C, NP  irbesartan (AVAPRO) 300 MG tablet TAKE 1 TABLET(300 MG) BY MOUTH DAILY 04/15/21   Ngetich, Dinah C, NP  levothyroxine (SYNTHROID) 25 MCG tablet TAKE 1 TABLET(25 MCG) BY MOUTH AT BEDTIME 04/15/21   Ngetich, Dinah C, NP  Melatonin 10 MG TABS Take 1 tablet by mouth at bedtime. 01/31/20   Reed, Tiffany L, DO  methocarbamol (ROBAXIN) 500 MG tablet Take 0.5 tablets (250 mg total) by mouth every 8 (eight) hours as needed for muscle spasms. 01/14/21   Ngetich, Dinah C, NP  Multiple Vitamins-Minerals (PRESERVISION AREDS 2) CAPS Take 1 capsule by mouth 2 (two) times daily.    [provider]  omeprazole (PRILOSEC) 20 MG capsule TAKE 1 CAPSULE(20 MG) BY MOUTH DAILY 04/15/21   Ngetich, Dinah C, NP  potassium chloride SA (KLOR-CON) 20 MEQ tablet TAKE 1 TABLET BY MOUTH EVERY DAY 04/15/21   Ngetich, Dinah C, NP  propranolol ER (INDERAL LA) 60 MG 24 hr capsule TAKE 1 CAPSULE(60 MG) BY MOUTH DAILY 04/22/21   Ngetich, Dinah C, NP  traZODone (DESYREL) 50 MG tablet TAKE 1 TABLET(50 MG) BY MOUTH AT BEDTIME 04/15/21   Ngetich, Dinah C, NP  vitamin C (ASCORBIC ACID) 500 MG tablet Take 1 tablet (500 mg total) by mouth daily. 07/18/18   Reed, Tiffany L, DO    Allergies    Garlic, Lac bovis, Lanolin, Penicillins, and Statins  Review of Systems   Review of Systems  Unable to perform ROS: Dementia   Physical Exam Updated Vital Signs BP (!) 150/75   Pulse (!) 45   Temp 97.8 F (36.6 C)   Resp 17   Ht 5'  9" (1.753 m)   Wt 64.1 kg   SpO2 95%   BMI 20.87 kg/m   Physical Exam Vitals and nursing note reviewed.  Constitutional:      General: She is not in acute distress.    Appearance: She is well-developed.  HENT:     Head: Normocephalic.     Comments: Occiptal hematoma with abrasion, hemostatic.    Mouth/Throat:     Pharynx: No oropharyngeal exudate.  Eyes:     Conjunctiva/sclera: Conjunctivae normal.     Pupils: Pupils are equal, round, and reactive to light.  Neck:     Comments: C collar in place. No  midline tenderness Cardiovascular:     Rate and Rhythm: Normal rate and regular rhythm.     Heart sounds: Normal heart sounds. No murmur heard. Pulmonary:     Effort: Pulmonary effort is normal. No respiratory distress.     Breath sounds: Normal breath sounds.  Abdominal:     Palpations: Abdomen is soft.     Tenderness: There is no abdominal tenderness. There is no guarding or rebound.  Musculoskeletal:        General: No tenderness. Normal range of motion.     Comments: Pelvis stable. No T or L spine tenderness  Skin:    General: Skin is warm.  Neurological:     Mental Status: She is alert.     Cranial Nerves: No cranial nerve deficit.     Motor: No abnormal muscle tone.     Coordination: Coordination normal.     Comments: Tremors of upper extremities.  Oriented to person and place.  No facial droop.  Follows commands and moves all extremities equally.  Psychiatric:        Behavior: Behavior normal.    ED Results / Procedures / Treatments   Labs (all labs ordered are listed, but only abnormal results are displayed) Labs Reviewed  CBC WITH DIFFERENTIAL/PLATELET - Abnormal; Notable for the following components:      Result Value   WBC 11.7 (*)    Neutro Abs 9.7 (*)    All other components within normal limits  COMPREHENSIVE METABOLIC PANEL - Abnormal; Notable for the following components:   CO2 33 (*)    Glucose, Bld 115 (*)    BUN 25 (*)    Creatinine, Ser  1.10 (*)    GFR, Estimated 46 (*)    All other components within normal limits  RESP PANEL BY RT-PCR (FLU A&B, COVID) ARPGX2  TSH    EKG None  Radiology CT HEAD WO CONTRAST (5MM)  Result Date: 05/21/2021 CLINICAL DATA:  Fall EXAM: CT HEAD WITHOUT CONTRAST CT CERVICAL SPINE WITHOUT CONTRAST TECHNIQUE: Multidetector CT imaging of the head and cervical spine was performed following the standard protocol without intravenous contrast. Multiplanar CT image reconstructions of the cervical spine were also generated. COMPARISON:  None. FINDINGS: CT HEAD FINDINGS Brain: There is no mass, hemorrhage or extra-axial collection. There is generalized atrophy without lobar predilection. There is hypoattenuation of the periventricular white matter, most commonly indicating chronic ischemic microangiopathy. Vascular: No abnormal hyperdensity of the major intracranial arteries or dural venous sinuses. No intracranial atherosclerosis. Skull: The visualized skull base, calvarium and extracranial soft tissues are normal. Sinuses/Orbits: No fluid levels or advanced mucosal thickening of the visualized paranasal sinuses. No mastoid or middle ear effusion. The orbits are normal. CT CERVICAL SPINE FINDINGS Alignment: Mild posterior subluxation C1 on C2. Skull base and vertebrae: Mildly displaced type 2 fracture of the dens, of uncertain age. Soft tissues and spinal canal: No prevertebral fluid or swelling. No visible canal hematoma. Disc levels: Moderate upper cervical left facet arthrosis. Upper chest: No pneumothorax, pulmonary nodule or pleural effusion. Other: Normal visualized paraspinal cervical soft tissues. IMPRESSION: 1. Chronic ischemic microangiopathy and generalized atrophy without acute intracranial abnormality. 2. Mildly displaced type 2 fracture of the dens with mild posterior subluxation C1 on C2. Acuity is uncertain. MRI recommended for further characterization. Critical Value/emergent results were called by  telephone at the time of interpretation on 05/21/2021 at 3:32 am to provider [facility] , who verbally acknowledged these results. Electronically Signed   By: Lennette Bihari  Collins Scotland M.D.   On: 05/21/2021 03:37   CT Cervical Spine Wo Contrast  Result Date: 05/21/2021 CLINICAL DATA:  Fall EXAM: CT HEAD WITHOUT CONTRAST CT CERVICAL SPINE WITHOUT CONTRAST TECHNIQUE: Multidetector CT imaging of the head and cervical spine was performed following the standard protocol without intravenous contrast. Multiplanar CT image reconstructions of the cervical spine were also generated. COMPARISON:  None. FINDINGS: CT HEAD FINDINGS Brain: There is no mass, hemorrhage or extra-axial collection. There is generalized atrophy without lobar predilection. There is hypoattenuation of the periventricular white matter, most commonly indicating chronic ischemic microangiopathy. Vascular: No abnormal hyperdensity of the major intracranial arteries or dural venous sinuses. No intracranial atherosclerosis. Skull: The visualized skull base, calvarium and extracranial soft tissues are normal. Sinuses/Orbits: No fluid levels or advanced mucosal thickening of the visualized paranasal sinuses. No mastoid or middle ear effusion. The orbits are normal. CT CERVICAL SPINE FINDINGS Alignment: Mild posterior subluxation C1 on C2. Skull base and vertebrae: Mildly displaced type 2 fracture of the dens, of uncertain age. Soft tissues and spinal canal: No prevertebral fluid or swelling. No visible canal hematoma. Disc levels: Moderate upper cervical left facet arthrosis. Upper chest: No pneumothorax, pulmonary nodule or pleural effusion. Other: Normal visualized paraspinal cervical soft tissues. IMPRESSION: 1. Chronic ischemic microangiopathy and generalized atrophy without acute intracranial abnormality. 2. Mildly displaced type 2 fracture of the dens with mild posterior subluxation C1 on C2. Acuity is uncertain. MRI recommended for further characterization.  Critical Value/emergent results were called by telephone at the time of interpretation on 05/21/2021 at 3:32 am to provider [facility] , who verbally acknowledged these results. Electronically Signed   By: Ulyses Jarred M.D.   On: 05/21/2021 03:37   DG Pelvis Portable  Result Date: 05/21/2021 CLINICAL DATA:  Fall. EXAM: PORTABLE PELVIS 1-2 VIEWS COMPARISON:  None. FINDINGS: There is no acute fracture or dislocation. The bones are osteopenic. Moderate bilateral hip arthritic changes. The soft tissues are grossly unremarkable. IMPRESSION: No acute fracture or dislocation. Electronically Signed   By: Anner Crete M.D.   On: 05/21/2021 03:23   DG Chest Portable 1 View  Result Date: 05/21/2021 CLINICAL DATA:  Fall and hypoxia EXAM: PORTABLE CHEST 1 VIEW COMPARISON:  None. FINDINGS: No focal consolidation, pleural effusion, or pneumothorax. Mild cardiomegaly. Atherosclerotic calcification of the aorta. Small hiatal hernia. No acute osseous pathology. IMPRESSION: No acute cardiopulmonary process. Electronically Signed   By: Anner Crete M.D.   On: 05/21/2021 03:22    Procedures Procedures   Medications Ordered in ED Medications - No data to display  ED Course  I have reviewed the triage vital signs and the nursing notes.  Pertinent labs & imaging results that were available during my care of the patient were reviewed by me and considered in my medical decision making (see chart for details).    MDM Rules/Calculators/A&P                          Unwitnessed fall with head injury. No anticoagulation use. At neuro baseline by report.   CT head is negative.  CT C-spine shows age-indeterminate C2 fracture of dens with subluxation C1 on C2.  Discussed with Dr. Collins Scotland of radiology.  Patient denies any neck pain.  She is placed in hard cervical collar.  Discussed with NP [facility] neurosurgery.  He recommends hard collar and outpatient follow-up in 1 to 2 weeks.  Does not feel she needs to be  emergently transferred for MRI  tonight.  Patient bradycardic in the 40s and 50s.  She does take propranolol.  EKG shows sinus bradycardia left bundle branch block, no comparison.  Patient did have some transient episodes of hypoxia but this was thought to be due to her nail polish and inappropriate position of pulse oximeter.  Patient is having runs of what appears to be atrial flutter with rates up to 150s and 160s.  She is asymptomatic from this.  D/w Patient's son Su Monks by phone.  He believes that her C2 fracture is old and has been there since February.  He states she never wore a c-collar. He is not aware of any history of atrial fibrillation or atrial flutter.  She is not anticoagulated.  Discussed with her arrhythmia and bradycardia there are some concern for syncope leading to her fall. He is agreeable to observation admission overnight.  He confirms patient is DNR and DNI.  Remains in sinus bradycardia in the 40s.  Patient denies any dizziness. Labs are reassuring.  Observation admission discussed with Dr. Clearence Ped.   ED ECG REPORT   Date: 05/21/2021  Rate: 47  Rhythm: sinus bradycardia  QRS Axis: normal  Intervals: normal  ST/T Wave abnormalities: normal  Conduction Disutrbances:left bundle branch block  Narrative Interpretation:   Old EKG Reviewed: none available  I have personally reviewed the EKG tracing and agree with the computerized printout as noted.   Final Clinical Impression(s) / ED DiagNoses Final diagnoses:  Syncope and collapse  Closed odontoid fracture with type II morphology and posterior displacement, initial encounter ([place])  Symptomatic bradycardia    Rx / DC Orders ED Discharge Orders     None        Kolina Kube, Annie Main, MD 05/21/21 [phone]

## 2021-05-21 NOTE — Progress Notes (Signed)
Accepted to tele bed [facility]. [facility] to assume care upon arrival to accepting facility.

## 2021-05-21 NOTE — Progress Notes (Signed)
   05/21/21 1810  Assess: MEWS Score  Temp 97.8 F (36.6 C)  BP (!) 146/67  Pulse Rate (!) 48  ECG Heart Rate (!) 48  Resp 12  SpO2 91 %  O2 Device Room Air  Assess: MEWS Score  MEWS Temp 0  MEWS Systolic 0  MEWS Pulse 1  MEWS RR 1  MEWS LOC 0  MEWS Score 2  MEWS Score Color Yellow  Assess: if the MEWS score is Yellow or Red  Were vital signs taken at a resting state? Yes  Focused Assessment No change from prior assessment  Early Detection of Sepsis Score *See Row Information* Low  MEWS guidelines implemented *See Row Information* Yes  Take Vital Signs  Increase Vital Sign Frequency  Yellow: Q 2hr X 2 then Q 4hr X 2, if remains yellow, continue Q 4hrs  Escalate  MEWS: Escalate Yellow: discuss with charge nurse/RN and consider discussing with provider and RRT  Notify: Charge Nurse/RN  Name of Charge Nurse/RN Notified Chrissy RN  Date Charge Nurse/RN Notified 05/21/21  Time Charge Nurse/RN Notified [id number]  Document  Patient Outcome Stabilized after interventions  Progress note created (see row info) Yes

## 2021-05-21 NOTE — ED Notes (Signed)
EMS [facility] replaced by [facility] by this RN and Rancour, MD. Patient comfortable at this time.

## 2021-05-22 DIAGNOSIS — I10 Essential (primary) hypertension: Secondary | ICD-10-CM | POA: Diagnosis not present

## 2021-05-22 DIAGNOSIS — S12111A Posterior displaced Type II dens fracture, initial encounter for closed fracture: Secondary | ICD-10-CM | POA: Diagnosis not present

## 2021-05-22 DIAGNOSIS — W19XXXA Unspecified fall, initial encounter: Secondary | ICD-10-CM | POA: Diagnosis not present

## 2021-05-22 LAB — BASIC METABOLIC PANEL
Anion gap: 8 (ref 5–15)
BUN: 18 mg/dL (ref 8–23)
CO2: 30 mmol/L (ref 22–32)
Calcium: 8.9 mg/dL (ref 8.9–10.3)
Chloride: 96 mmol/L — ABNORMAL LOW (ref 98–111)
Creatinine, Ser: 1.04 mg/dL — ABNORMAL HIGH (ref 0.44–1.00)
GFR, Estimated: 49 mL/min — ABNORMAL LOW (ref >60)
Glucose, Bld: 104 mg/dL — ABNORMAL HIGH (ref 70–99)
Potassium: 3.3 mmol/L — ABNORMAL LOW (ref 3.5–5.1)
Sodium: 134 mmol/L — ABNORMAL LOW (ref 135–145)

## 2021-05-22 LAB — CBC
HCT: 34.4 % — ABNORMAL LOW (ref 36.0–46.0)
Hemoglobin: 11.4 g/dL — ABNORMAL LOW (ref 12.0–15.0)
MCH: 31.4 pg (ref 26.0–34.0)
MCHC: 33.1 g/dL (ref 30.0–36.0)
MCV: 94.8 fL (ref 80.0–100.0)
Platelets: 182 10*3/uL (ref 150–400)
RBC: 3.63 MIL/uL — ABNORMAL LOW (ref 3.87–5.11)
RDW: 12.6 % (ref 11.5–15.5)
WBC: 8.3 10*3/uL (ref 4.0–10.5)
nRBC: 0 % (ref 0.0–0.2)

## 2021-05-22 MED ORDER — POTASSIUM CHLORIDE 20 MEQ PO PACK
40.0000 meq | PACK | Freq: Once | ORAL | Status: AC
  Administered 2021-05-22: 40 meq via ORAL
  Filled 2021-05-22: qty 2

## 2021-05-22 NOTE — TOC CAGE-AID Note (Signed)
Transition of Care [facility]) - CAGE-AID Screening   Patient Details  Name: Maysel Mccarron MRN: [id number] Date of Birth: 1923/12/02   Elvina Sidle, RN Trauma Response Nurse Phone Number: [phone] 05/22/2021, 5:00 PM      CAGE-AID Screening: Substance Abuse Screening unable to be completed due to: : Patient unable to participate (pt has alzheimer's dementia- and is a palliative pt- unable to answer questions)

## 2021-05-22 NOTE — Progress Notes (Signed)
PROGRESS NOTE    April Stevenson  [id number] DOB: 1924/05/26 DOA: 05/21/2021 PCP: Sandrea Hughs, NP    Brief Narrative: This 87 years old female with PMH significant for hypertension, hyperlipidemia, hypothyroidism, pulmonary hypertension, Alzheimer's dementia presented after having an unwitnessed fall in the nursing home.  History is obtained from patient's son over the phone and review of records.  Patient had been found on the floor by her bedroom after attempting to go to the bathroom on her own.  She has significant dementia and only oriented to self but she can ambulate without assistance.  She has been living at home with her son for last 44-monthbut had been temporarily moved to [facility] while son is currently undergoing treatment for leukemia.  Patient denies any complaint at this time. CT head and cervical spine was significant for mildly displaced type II fracture of the dens and anterior posterior subluxation of C1 and C2.  Assessment & Plan:   Principal Problem:   Fall Active Problems:   Essential hypertension   Hypothyroidism   Late onset Alzheimer's disease with behavioral disturbance ([place]   Syncope   Closed posterior displaced Type II dens fracture (HCC)   Bradycardia   DNR (do not resuscitate)   CKD (chronic kidney disease), stage III ([facility]   Syncope s/p fall: Patient presented after having a unwitnessed fall at the nursing facility.   At baseline patient does not usually use a walker or assistive aid to get around.   Unclear if patient had a mechanical fall or had a syncopal episode as patient was noted to be bradycardic, but blood pressures appear to be maintained. Continue telemetry, check orthostatic hypotension Hold home medication which reduces to heart rate  Dens type II fracture:   Patient was noted to have a dens type II fracture of the cervical spine that was seen on initial imaging of unclear age.   Continue cervical  collar MRI C-spine:  C2 fracture with moderate edema, but no prevertebral effusion or hematoma. The findings remain somewhat equivocal, but favored to be acute or recent subacute. Neurosurgery consulted,  awaiting recommendation.   Leukocytosis:  Chest x-ray unremarkable.   Patient remained afebrile it could be reactive.   Recheck labs shows normal WBC.  Sinus bradycardia:  On admission patient's heart rates were noted to be anywhere from 37-58, but blood pressures are currently maintained.  Home blood pressure medications include propranolol 60 mg daily, irbesartan 300 mg daily, and furosemide 40 mg daily. Hold propranolol due to bradycardia Follow-up telemetry overnight and give atropine if needed for signs of hemodynamic instability. Heart rate now improved.   Dementia, with behavioral disturbance:  Patient has significant dementia and is only oriented to self.  While in the hospital patient was noted to be getting up out of bed and is at risk of falls. Continue delirium precaution Continue sitter to bedside Hold Aricept as it could possibly worsen bradycardia   Essential hypertension: Blood pressures were noted to be elevated up to 183/81.  Home blood pressure medications include furosemide 40 mg daily, irbesartan 300 mg daily, and propranolol 60 mg daily. Continue Irbesartan Hold furosemide due to possible dehydration with elevated BUN to creatinine ratio Hold propranolol due to bradycardia Continue Hydralazine IV as needed for elevated systolic blood pressure greater than 180   Hypothyroidism Continue levothyroxine   Chronic kidney disease stage IIIa:  Serum creatinine at baseline Continue to monitor   GERD Continue pharmacy substitution of Protonix  DVT prophylaxis: Lovenox. Code Status: DNR Family Communication: No family at bed side. Disposition Plan:   Status is: Observation  The patient remains OBS appropriate and will d/c before 2 midnights.  Dispo:  The patient is from: Home              Anticipated d/c is to: SNF              Patient currently is not medically stable to d/c.   Difficult to place patient No    Consultants:  Neurosurgery  Procedures: Antimicrobials:  Anti-infectives (From admission, onward)    None      Subjective: Patient was seen and examined at bedside.  Overnight events noted.  Patient lying comfortably in bed in cervical collar, She  reports pain is better controlled.  She denies any numbness.  Objective: Vitals:   05/22/21 0749 05/22/21 0900 05/22/21 1120 05/22/21 1420  BP: (!) 161/74 138/74 (!) 152/71 (!) 165/67  Pulse: 60 60 (!) 51   Resp: '12 16 15 14  '$ Temp: 98.6 F (37 C)     TempSrc: Oral     SpO2: 93% 95% 93%   Weight:      Height:        Intake/Output Summary (Last 24 hours) at 05/22/2021 1521 Last data filed at 05/22/2021 0424 Gross per 24 hour  Intake --  Output 350 ml  Net -350 ml   Filed Weights   05/21/21 0236  Weight: 64.1 kg    Examination:  General exam: Appears calm and comfortable , not in any acute distress, in cervical collar. Respiratory system: Clear to auscultation. Respiratory effort normal. Cardiovascular system: S1 & S2 heard, RRR. No JVD, murmurs, rubs, gallops or clicks. No pedal edema. Gastrointestinal system: Abdomen is nondistended, soft and nontender. No organomegaly or masses felt. Normal bowel sounds heard. Central nervous system: Alert and oriented. No focal neurological deficits. Extremities: No edema, no cyanosis, no clubbing. Skin: No rashes, lesions or ulcers Psychiatry: Judgement and insight appear normal. Mood & affect appropriate.     Data Reviewed: I have personally reviewed following labs and imaging studies  CBC: Recent Labs  Lab 05/21/21 0358 05/22/21 0226  WBC 11.7* 8.3  NEUTROABS 9.7*  --   HGB 12.3 11.4*  HCT 37.2 34.4*  MCV 94.4 94.8  PLT 187 [id number]   Basic Metabolic Panel: Recent Labs  Lab 05/21/21 0358 05/22/21 0226  NA  140 134*  K 4.1 3.3*  CL 98 96*  CO2 33* 30  GLUCOSE 115* 104*  BUN 25* 18  CREATININE 1.10* 1.04*  CALCIUM 9.3 8.9   GFR: Estimated Creatinine Clearance: 32.7 mL/min (A) (by C-G formula based on SCr of 1.04 mg/dL (H)). Liver Function Tests: Recent Labs  Lab 05/21/21 0358  AST 25  ALT 12  ALKPHOS 105  BILITOT 0.8  PROT 6.6  ALBUMIN 3.5   No results for input(s): LIPASE, AMYLASE in the last 168 hours. No results for input(s): AMMONIA in the last 168 hours. Coagulation Profile: No results for input(s): INR, PROTIME in the last 168 hours. Cardiac Enzymes: No results for input(s): CKTOTAL, CKMB, CKMBINDEX, TROPONINI in the last 168 hours. BNP (last 3 results) No results for input(s): PROBNP in the last 8760 hours. HbA1C: No results for input(s): HGBA1C in the last 72 hours. CBG: No results for input(s): GLUCAP in the last 168 hours. Lipid Profile: No results for input(s): CHOL, HDL, LDLCALC, TRIG, CHOLHDL, LDLDIRECT in the last 72 hours. Thyroid Function Tests: Recent  Labs    05/21/21 0359  TSH 2.389   Anemia Panel: No results for input(s): VITAMINB12, FOLATE, FERRITIN, TIBC, IRON, RETICCTPCT in the last 72 hours. Sepsis Labs: No results for input(s): PROCALCITON, LATICACIDVEN in the last 168 hours.  Recent Results (from the past 240 hour(s))  Resp Panel by RT-PCR (Flu A&B, Covid) Nasopharyngeal Swab     Status: None   Collection Time: 05/21/21  4:51 AM   Specimen: Nasopharyngeal Swab; Nasopharyngeal(NP) swabs in vial transport medium  Result Value Ref Range Status   SARS Coronavirus 2 by RT PCR NEGATIVE NEGATIVE Final    Comment: (NOTE) SARS-CoV-2 target nucleic acids are NOT DETECTED.  The SARS-CoV-2 RNA is generally detectable in upper respiratory specimens during the acute phase of infection. The lowest concentration of SARS-CoV-2 viral copies this assay can detect is 138 copies/mL. A negative result does not preclude SARS-Cov-2 infection and should not be  used as the sole basis for treatment or other patient management decisions. A negative result may occur with  improper specimen collection/handling, submission of specimen other than nasopharyngeal swab, presence of viral mutation(s) within the areas targeted by this assay, and inadequate number of viral copies(<138 copies/mL). A negative result must be combined with clinical observations, patient history, and epidemiological information. The expected result is Negative.  Fact Sheet for Patients:  EntrepreneurPulse.com.au  Fact Sheet for Healthcare Providers:  IncredibleEmployment.be  This test is no t yet approved or cleared by the [place] FDA and  has been authorized for detection and/or diagnosis of SARS-CoV-2 by FDA under an Emergency Use Authorization (EUA). This EUA will remain  in effect (meaning this test can be used) for the duration of the COVID-19 declaration under Section 564(b)(1) of the Act, 21 U.S.C.section 360bbb-3(b)(1), unless the authorization is terminated  or revoked sooner.       Influenza A by PCR NEGATIVE NEGATIVE Final   Influenza B by PCR NEGATIVE NEGATIVE Final    Comment: (NOTE) The Xpert Xpress SARS-CoV-2/FLU/RSV plus assay is intended as an aid in the diagnosis of influenza from Nasopharyngeal swab specimens and should not be used as a sole basis for treatment. Nasal washings and aspirates are unacceptable for Xpert Xpress SARS-CoV-2/FLU/RSV testing.  Fact Sheet for Patients: EntrepreneurPulse.com.au  Fact Sheet for Healthcare Providers: IncredibleEmployment.be  This test is not yet approved or cleared by the [place] FDA and has been authorized for detection and/or diagnosis of SARS-CoV-2 by FDA under an Emergency Use Authorization (EUA). This EUA will remain in effect (meaning this test can be used) for the duration of the COVID-19 declaration under Section  564(b)(1) of the Act, 21 U.S.C. section 360bbb-3(b)(1), unless the authorization is terminated or revoked.  Performed at [facility], [address], [place], [place] [address]     Radiology Studies: CT HEAD WO CONTRAST (5MM)  Result Date: 05/21/2021 CLINICAL DATA:  Fall EXAM: CT HEAD WITHOUT CONTRAST CT CERVICAL SPINE WITHOUT CONTRAST TECHNIQUE: Multidetector CT imaging of the head and cervical spine was performed following the standard protocol without intravenous contrast. Multiplanar CT image reconstructions of the cervical spine were also generated. COMPARISON:  None. FINDINGS: CT HEAD FINDINGS Brain: There is no mass, hemorrhage or extra-axial collection. There is generalized atrophy without lobar predilection. There is hypoattenuation of the periventricular white matter, most commonly indicating chronic ischemic microangiopathy. Vascular: No abnormal hyperdensity of the major intracranial arteries or dural venous sinuses. No intracranial atherosclerosis. Skull: The visualized skull base, calvarium and extracranial soft tissues are normal. Sinuses/Orbits: No  fluid levels or advanced mucosal thickening of the visualized paranasal sinuses. No mastoid or middle ear effusion. The orbits are normal. CT CERVICAL SPINE FINDINGS Alignment: Mild posterior subluxation C1 on C2. Skull base and vertebrae: Mildly displaced type 2 fracture of the dens, of uncertain age. Soft tissues and spinal canal: No prevertebral fluid or swelling. No visible canal hematoma. Disc levels: Moderate upper cervical left facet arthrosis. Upper chest: No pneumothorax, pulmonary nodule or pleural effusion. Other: Normal visualized paraspinal cervical soft tissues. IMPRESSION: 1. Chronic ischemic microangiopathy and generalized atrophy without acute intracranial abnormality. 2. Mildly displaced type 2 fracture of the dens with mild posterior subluxation C1 on C2. Acuity is uncertain. MRI recommended for further  characterization. Critical Value/emergent results were called by telephone at the time of interpretation on 05/21/2021 at 3:32 am to provider [facility] , who verbally acknowledged these results. Electronically Signed   By: Ulyses Jarred M.D.   On: 05/21/2021 03:37   CT Cervical Spine Wo Contrast  Result Date: 05/21/2021 CLINICAL DATA:  Fall EXAM: CT HEAD WITHOUT CONTRAST CT CERVICAL SPINE WITHOUT CONTRAST TECHNIQUE: Multidetector CT imaging of the head and cervical spine was performed following the standard protocol without intravenous contrast. Multiplanar CT image reconstructions of the cervical spine were also generated. COMPARISON:  None. FINDINGS: CT HEAD FINDINGS Brain: There is no mass, hemorrhage or extra-axial collection. There is generalized atrophy without lobar predilection. There is hypoattenuation of the periventricular white matter, most commonly indicating chronic ischemic microangiopathy. Vascular: No abnormal hyperdensity of the major intracranial arteries or dural venous sinuses. No intracranial atherosclerosis. Skull: The visualized skull base, calvarium and extracranial soft tissues are normal. Sinuses/Orbits: No fluid levels or advanced mucosal thickening of the visualized paranasal sinuses. No mastoid or middle ear effusion. The orbits are normal. CT CERVICAL SPINE FINDINGS Alignment: Mild posterior subluxation C1 on C2. Skull base and vertebrae: Mildly displaced type 2 fracture of the dens, of uncertain age. Soft tissues and spinal canal: No prevertebral fluid or swelling. No visible canal hematoma. Disc levels: Moderate upper cervical left facet arthrosis. Upper chest: No pneumothorax, pulmonary nodule or pleural effusion. Other: Normal visualized paraspinal cervical soft tissues. IMPRESSION: 1. Chronic ischemic microangiopathy and generalized atrophy without acute intracranial abnormality. 2. Mildly displaced type 2 fracture of the dens with mild posterior subluxation C1 on C2.  Acuity is uncertain. MRI recommended for further characterization. Critical Value/emergent results were called by telephone at the time of interpretation on 05/21/2021 at 3:32 am to provider [facility] , who verbally acknowledged these results. Electronically Signed   By: Ulyses Jarred M.D.   On: 05/21/2021 03:37   MR Cervical Spine Wo Contrast  Result Date: 05/21/2021 CLINICAL DATA:  Age-indeterminate spinal fracture EXAM: MRI CERVICAL SPINE WITHOUT CONTRAST TECHNIQUE: Multiplanar, multisequence MR imaging of the cervical spine was performed. No intravenous contrast was administered. COMPARISON:  CT cervical spine 05/21/2021 FINDINGS: Alignment: Angulation at the C2 fracture site is unchanged. Otherwise, normal cervical spine alignment with mild reversal of normal lordosis. Vertebrae: Fracture of C2 (type 2) with mild posterior angulation of the superior segment. There is moderate edema at the fracture site. No paravertebral abnormality. Cord: Normal signal and morphology. Posterior Fossa, vertebral arteries, paraspinal tissues: Negative. Disc levels: C1-2: Unremarkable. C2-3: Small central disc protrusion. There is no spinal canal stenosis. No neural foraminal stenosis. C3-4: Left asymmetric disc bulge with uncinate spurring. Mild spinal canal stenosis. Left facet hypertrophy with severe left neural foraminal stenosis. C4-5: Normal disc space and facet joints. There is  no spinal canal stenosis. No neural foraminal stenosis. C5-6: Disc space narrowing with small bulge and uncovertebral hypertrophy. There is no spinal canal stenosis. Severe bilateral neural foraminal stenosis. C6-7: Small left subarticular disc protrusion. There is no spinal canal stenosis. No neural foraminal stenosis. C7-T1: Normal disc space and facet joints. There is no spinal canal stenosis. No neural foraminal stenosis. IMPRESSION: 1. C2 fracture with moderate edema, but no prevertebral effusion or hematoma. The findings remain somewhat  equivocal, but favored to be acute or recent subacute. 2. Severe bilateral C5-6 neural foraminal stenosis. 3. Mild C3-4 spinal canal stenosis and severe left neural foraminal stenosis. Electronically Signed   By: Ulyses Jarred M.D.   On: 05/21/2021 22:28   DG Pelvis Portable  Result Date: 05/21/2021 CLINICAL DATA:  Fall. EXAM: PORTABLE PELVIS 1-2 VIEWS COMPARISON:  None. FINDINGS: There is no acute fracture or dislocation. The bones are osteopenic. Moderate bilateral hip arthritic changes. The soft tissues are grossly unremarkable. IMPRESSION: No acute fracture or dislocation. Electronically Signed   By: Anner Crete M.D.   On: 05/21/2021 03:23   DG Chest Portable 1 View  Result Date: 05/21/2021 CLINICAL DATA:  Fall and hypoxia EXAM: PORTABLE CHEST 1 VIEW COMPARISON:  None. FINDINGS: No focal consolidation, pleural effusion, or pneumothorax. Mild cardiomegaly. Atherosclerotic calcification of the aorta. Small hiatal hernia. No acute osseous pathology. IMPRESSION: No acute cardiopulmonary process. Electronically Signed   By: Anner Crete M.D.   On: 05/21/2021 03:22     Scheduled Meds:  enoxaparin (LOVENOX) injection  40 mg Subcutaneous Q24H   FLUoxetine  20 mg Oral Daily   irbesartan  300 mg Oral Daily   levothyroxine  25 mcg Oral Q0600   melatonin  10 mg Oral QHS   pantoprazole  40 mg Oral Daily   sodium chloride flush  3 mL Intravenous Q12H   traZODone  50 mg Oral QHS   Continuous Infusions:   LOS: 0 days    Time spent: 35 mins    Isair Inabinet, MD [facility]   If 7PM-7AM, please contact night-coverage

## 2021-05-23 DIAGNOSIS — W19XXXA Unspecified fall, initial encounter: Secondary | ICD-10-CM | POA: Diagnosis not present

## 2021-05-23 DIAGNOSIS — S12111A Posterior displaced Type II dens fracture, initial encounter for closed fracture: Secondary | ICD-10-CM | POA: Diagnosis not present

## 2021-05-23 DIAGNOSIS — I10 Essential (primary) hypertension: Secondary | ICD-10-CM | POA: Diagnosis not present

## 2021-05-23 LAB — CBC
HCT: 37.4 % (ref 36.0–46.0)
Hemoglobin: 12.5 g/dL (ref 12.0–15.0)
MCH: 31.3 pg (ref 26.0–34.0)
MCHC: 33.4 g/dL (ref 30.0–36.0)
MCV: 93.5 fL (ref 80.0–100.0)
Platelets: 214 10*3/uL (ref 150–400)
RBC: 4 MIL/uL (ref 3.87–5.11)
RDW: 12.6 % (ref 11.5–15.5)
WBC: 9.9 10*3/uL (ref 4.0–10.5)
nRBC: 0 % (ref 0.0–0.2)

## 2021-05-23 LAB — COMPREHENSIVE METABOLIC PANEL
ALT: 15 U/L (ref 0–44)
AST: 19 U/L (ref 15–41)
Albumin: 3 g/dL — ABNORMAL LOW (ref 3.5–5.0)
Alkaline Phosphatase: 104 U/L (ref 38–126)
Anion gap: 9 (ref 5–15)
BUN: 21 mg/dL (ref 8–23)
CO2: 29 mmol/L (ref 22–32)
Calcium: 9 mg/dL (ref 8.9–10.3)
Chloride: 98 mmol/L (ref 98–111)
Creatinine, Ser: 1.25 mg/dL — ABNORMAL HIGH (ref 0.44–1.00)
GFR, Estimated: 40 mL/min — ABNORMAL LOW (ref >60)
Glucose, Bld: 93 mg/dL (ref 70–99)
Potassium: 3.6 mmol/L (ref 3.5–5.1)
Sodium: 136 mmol/L (ref 135–145)
Total Bilirubin: 0.9 mg/dL (ref 0.3–1.2)
Total Protein: 6.1 g/dL — ABNORMAL LOW (ref 6.5–8.1)

## 2021-05-23 LAB — MAGNESIUM: Magnesium: 1.7 mg/dL (ref 1.7–2.4)

## 2021-05-23 LAB — PHOSPHORUS: Phosphorus: 3.7 mg/dL (ref 2.5–4.6)

## 2021-05-23 MED ORDER — ENOXAPARIN SODIUM 30 MG/0.3ML IJ SOSY
30.0000 mg | PREFILLED_SYRINGE | INTRAMUSCULAR | Status: DC
  Administered 2021-05-23 – 2021-05-26 (×4): 30 mg via SUBCUTANEOUS
  Filled 2021-05-23 (×4): qty 0.3

## 2021-05-23 MED ORDER — KETOTIFEN FUMARATE 0.025 % OP SOLN: 1.0000 [drp] | Freq: Two times a day (BID) | OPHTHALMIC | Status: DC | PRN

## 2021-05-23 MED ORDER — POLYVINYL ALCOHOL 1.4 % OP SOLN
1.0000 [drp] | OPHTHALMIC | Status: DC | PRN
  Administered 2021-05-24: 1 [drp] via OPHTHALMIC
  Filled 2021-05-23: qty 15

## 2021-05-23 NOTE — Progress Notes (Signed)
PROGRESS NOTE    April Stevenson  [id number] DOB: [birth year] DOA: 05/21/2021 PCP: Sandrea Hughs, NP    Brief Narrative: This 87 years old female with PMH significant for hypertension, hyperlipidemia, hypothyroidism, pulmonary hypertension, Alzheimer's dementia presented after having an unwitnessed fall in the nursing home.  History is obtained from patient's son over the phone and review of records.  Patient had been found on the floor by her bedroom after attempting to go to the bathroom on her own.  She has significant dementia and only oriented to self but she can ambulate without assistance.  She has been living at home with her son for last 26-monthbut had been temporarily moved to [facility] while son is currently undergoing treatment for leukemia.  Patient denies any complaint at this time. CT head and cervical spine was significant for mildly displaced type II fracture of the dens and anterior posterior subluxation of C1 and C2.  Assessment & Plan:   Principal Problem:   Fall Active Problems:   Essential hypertension   Hypothyroidism   Late onset Alzheimer's disease with behavioral disturbance ([place]   Syncope   Closed posterior displaced Type II dens fracture (HCC)   Bradycardia   DNR (do not resuscitate)   CKD (chronic kidney disease), stage III ([facility]   Syncope s/p fall: Patient presented after having a unwitnessed fall at the nursing facility.   At baseline patient does not usually use a walker or assistive aid to get around.   Unclear if patient had a mechanical fall or had a syncopal episode as patient was noted to be bradycardic, but blood pressures appear to be maintained. Continue telemetry, check orthostatic hypotension Hold home medication which reduces to heart rate.  Dens type II fracture:   Patient was noted to have a dens type II fracture of the cervical spine that was seen on initial imaging of unclear age.   Continue cervical  collar MRI C-spine:  C2 fracture with moderate edema, but no prevertebral effusion or hematoma. The findings remain somewhat equivocal, but favored to be acute or recent subacute. Neurosurgery consulted,  awaiting recommendation.   Leukocytosis:  Chest x-ray unremarkable.   Patient remained afebrile it could be reactive.   Recheck labs shows normal WBC.  Sinus bradycardia:  On admission patient's heart rates were noted to be anywhere from 37-58, but blood pressures are currently maintained.   Home blood pressure medications include propranolol 60 mg daily, irbesartan 300 mg daily, and furosemide 40 mg daily. Hold propranolol due to bradycardia Continue telemetry nd give atropine if needed for signs of hemodynamic instability. Heart rate now improved.   Dementia, with behavioral disturbance:  Patient has significant dementia and is only oriented to self.  While in the hospital patient was noted to be getting up out of bed and is at risk of falls. Continue delirium precaution Continue sitter to bedside Hold Aricept as it could possibly worsen bradycardia   Essential hypertension: Blood pressures were noted to be elevated up to 183/81.  Home blood pressure medications include furosemide 40 mg daily, irbesartan 300 mg daily, and propranolol 60 mg daily. Continue Irbesartan Hold furosemide due to possible dehydration with elevated BUN to creatinine ratio Hold propranolol due to bradycardia Continue Hydralazine IV as needed for elevated systolic blood pressure greater than 180   Hypothyroidism Continue levothyroxine   Chronic kidney disease stage IIIa:  Serum creatinine at baseline. Continue to monitor   GERD Continue pantoprazole.     DVT  prophylaxis: Lovenox. Code Status: DNR Family Communication: No family at bed side. Disposition Plan:   Status is: Observation  The patient remains OBS appropriate and will d/c before 2 midnights.  Dispo: The patient is from: Home               Anticipated d/c is to: SNF              Patient currently is not medically stable to d/c.   Difficult to place patient No    Consultants:  Neurosurgery  Procedures: Antimicrobials:  Anti-infectives (From admission, onward)    None     Subjective: Patient was seen and examined at bedside.  No overnight events.   Patient is lying comfortably in the bed in cervical collar.   She reports pain is better controlled.  she was having physical therapy session.  She denies any numbness.    Objective: Vitals:   05/22/21 1937 05/23/21 0409 05/23/21 0828 05/23/21 1159  BP: (!) 144/68 (!) 170/77 (!) 169/70 138/64  Pulse:   60 63  Resp: '18 19 16 15  '$ Temp: 98.3 F (36.8 C) 98.6 F (37 C) 98 F (36.7 C) 97.7 F (36.5 C)  TempSrc: Oral Oral Oral Oral  SpO2:   96% 95%  Weight:      Height:        Intake/Output Summary (Last 24 hours) at 05/23/2021 1430 Last data filed at 05/23/2021 1325 Gross per 24 hour  Intake 370 ml  Output 350 ml  Net 20 ml    Filed Weights   05/21/21 0236  Weight: 64.1 kg    Examination:  General exam: Appears calm and comfortable , not in any acute distress, in cervical collar. Respiratory system: Clear to auscultation, respiratory effort normal, RR 15 Cardiovascular system: S1 & S2 heard, RRR. No JVD, murmurs, rubs, gallops or clicks. No pedal edema. Gastrointestinal system: Abdomen is nondistended, soft and nontender. No organomegaly or masses felt. Normal bowel sounds heard. Central nervous system: Alert and oriented x 3. No focal neurological deficits.  Neck tenderness+ Extremities: No edema, no cyanosis, no clubbing. Skin: No rashes, lesions or ulcers Psychiatry: Judgement and insight appear normal. Mood & affect appropriate.     Data Reviewed: I have personally reviewed following labs and imaging studies  CBC: Recent Labs  Lab 05/21/21 0358 05/22/21 0226 05/23/21 0148  WBC 11.7* 8.3 9.9  NEUTROABS 9.7*  --   --   HGB 12.3 11.4*  12.5  HCT 37.2 34.4* 37.4  MCV 94.4 94.8 93.5  PLT 187 182 [id number]    Basic Metabolic Panel: Recent Labs  Lab 05/21/21 0358 05/22/21 0226 05/23/21 0148  NA 140 134* 136  K 4.1 3.3* 3.6  CL 98 96* 98  CO2 33* 30 29  GLUCOSE 115* 104* 93  BUN 25* 18 21  CREATININE 1.10* 1.04* 1.25*  CALCIUM 9.3 8.9 9.0  MG  --   --  1.7  PHOS  --   --  3.7    GFR: Estimated Creatinine Clearance: 27.2 mL/min (A) (by C-G formula based on SCr of 1.25 mg/dL (H)). Liver Function Tests: Recent Labs  Lab 05/21/21 0358 05/23/21 0148  AST 25 19  ALT 12 15  ALKPHOS 105 104  BILITOT 0.8 0.9  PROT 6.6 6.1*  ALBUMIN 3.5 3.0*    No results for input(s): LIPASE, AMYLASE in the last 168 hours. No results for input(s): AMMONIA in the last 168 hours. Coagulation Profile: No results for input(s): INR, PROTIME  in the last 168 hours. Cardiac Enzymes: No results for input(s): CKTOTAL, CKMB, CKMBINDEX, TROPONINI in the last 168 hours. BNP (last 3 results) No results for input(s): PROBNP in the last 8760 hours. HbA1C: No results for input(s): HGBA1C in the last 72 hours. CBG: No results for input(s): GLUCAP in the last 168 hours. Lipid Profile: No results for input(s): CHOL, HDL, LDLCALC, TRIG, CHOLHDL, LDLDIRECT in the last 72 hours. Thyroid Function Tests: Recent Labs    05/21/21 0359  TSH 2.389    Anemia Panel: No results for input(s): VITAMINB12, FOLATE, FERRITIN, TIBC, IRON, RETICCTPCT in the last 72 hours. Sepsis Labs: No results for input(s): PROCALCITON, LATICACIDVEN in the last 168 hours.  Recent Results (from the past 240 hour(s))  Resp Panel by RT-PCR (Flu A&B, Covid) Nasopharyngeal Swab     Status: None   Collection Time: 05/21/21  4:51 AM   Specimen: Nasopharyngeal Swab; Nasopharyngeal(NP) swabs in vial transport medium  Result Value Ref Range Status   SARS Coronavirus 2 by RT PCR NEGATIVE NEGATIVE Final    Comment: (NOTE) SARS-CoV-2 target nucleic acids are NOT DETECTED.  The  SARS-CoV-2 RNA is generally detectable in upper respiratory specimens during the acute phase of infection. The lowest concentration of SARS-CoV-2 viral copies this assay can detect is 138 copies/mL. A negative result does not preclude SARS-Cov-2 infection and should not be used as the sole basis for treatment or other patient management decisions. A negative result may occur with  improper specimen collection/handling, submission of specimen other than nasopharyngeal swab, presence of viral mutation(s) within the areas targeted by this assay, and inadequate number of viral copies(<138 copies/mL). A negative result must be combined with clinical observations, patient history, and epidemiological information. The expected result is Negative.  Fact Sheet for Patients:  EntrepreneurPulse.com.au  Fact Sheet for Healthcare Providers:  IncredibleEmployment.be  This test is no t yet approved or cleared by the [place] FDA and  has been authorized for detection and/or diagnosis of SARS-CoV-2 by FDA under an Emergency Use Authorization (EUA). This EUA will remain  in effect (meaning this test can be used) for the duration of the COVID-19 declaration under Section 564(b)(1) of the Act, 21 U.S.C.section 360bbb-3(b)(1), unless the authorization is terminated  or revoked sooner.       Influenza A by PCR NEGATIVE NEGATIVE Final   Influenza B by PCR NEGATIVE NEGATIVE Final    Comment: (NOTE) The Xpert Xpress SARS-CoV-2/FLU/RSV plus assay is intended as an aid in the diagnosis of influenza from Nasopharyngeal swab specimens and should not be used as a sole basis for treatment. Nasal washings and aspirates are unacceptable for Xpert Xpress SARS-CoV-2/FLU/RSV testing.  Fact Sheet for Patients: EntrepreneurPulse.com.au  Fact Sheet for Healthcare Providers: IncredibleEmployment.be  This test is not yet approved or  cleared by the [place] FDA and has been authorized for detection and/or diagnosis of SARS-CoV-2 by FDA under an Emergency Use Authorization (EUA). This EUA will remain in effect (meaning this test can be used) for the duration of the COVID-19 declaration under Section 564(b)(1) of the Act, 21 U.S.C. section 360bbb-3(b)(1), unless the authorization is terminated or revoked.  Performed at [facility], [address] [place], [place] [address]      Radiology Studies: MR Cervical Spine Wo Contrast  Result Date: 05/21/2021 CLINICAL DATA:  Age-indeterminate spinal fracture EXAM: MRI CERVICAL SPINE WITHOUT CONTRAST TECHNIQUE: Multiplanar, multisequence MR imaging of the cervical spine was performed. No intravenous contrast was administered. COMPARISON:  CT cervical  spine 05/21/2021 FINDINGS: Alignment: Angulation at the C2 fracture site is unchanged. Otherwise, normal cervical spine alignment with mild reversal of normal lordosis. Vertebrae: Fracture of C2 (type 2) with mild posterior angulation of the superior segment. There is moderate edema at the fracture site. No paravertebral abnormality. Cord: Normal signal and morphology. Posterior Fossa, vertebral arteries, paraspinal tissues: Negative. Disc levels: C1-2: Unremarkable. C2-3: Small central disc protrusion. There is no spinal canal stenosis. No neural foraminal stenosis. C3-4: Left asymmetric disc bulge with uncinate spurring. Mild spinal canal stenosis. Left facet hypertrophy with severe left neural foraminal stenosis. C4-5: Normal disc space and facet joints. There is no spinal canal stenosis. No neural foraminal stenosis. C5-6: Disc space narrowing with small bulge and uncovertebral hypertrophy. There is no spinal canal stenosis. Severe bilateral neural foraminal stenosis. C6-7: Small left subarticular disc protrusion. There is no spinal canal stenosis. No neural foraminal stenosis. C7-T1: Normal disc space and facet  joints. There is no spinal canal stenosis. No neural foraminal stenosis. IMPRESSION: 1. C2 fracture with moderate edema, but no prevertebral effusion or hematoma. The findings remain somewhat equivocal, but favored to be acute or recent subacute. 2. Severe bilateral C5-6 neural foraminal stenosis. 3. Mild C3-4 spinal canal stenosis and severe left neural foraminal stenosis. Electronically Signed   By: Ulyses Jarred M.D.   On: 05/21/2021 22:28     Scheduled Meds:  enoxaparin (LOVENOX) injection  30 mg Subcutaneous Q24H   FLUoxetine  20 mg Oral Daily   irbesartan  300 mg Oral Daily   levothyroxine  25 mcg Oral Q0600   melatonin  10 mg Oral QHS   pantoprazole  40 mg Oral Daily   sodium chloride flush  3 mL Intravenous Q12H   traZODone  50 mg Oral QHS   Continuous Infusions:   LOS: 0 days    Time spent: 25 mins    Elizabet Schweppe, MD [facility]   If 7PM-7AM, please contact night-coverage

## 2021-05-24 DIAGNOSIS — S12100A Unspecified displaced fracture of second cervical vertebra, initial encounter for closed fracture: Secondary | ICD-10-CM | POA: Diagnosis not present

## 2021-05-24 DIAGNOSIS — I10 Essential (primary) hypertension: Secondary | ICD-10-CM | POA: Diagnosis not present

## 2021-05-24 DIAGNOSIS — S12111A Posterior displaced Type II dens fracture, initial encounter for closed fracture: Secondary | ICD-10-CM | POA: Diagnosis not present

## 2021-05-24 DIAGNOSIS — W19XXXA Unspecified fall, initial encounter: Secondary | ICD-10-CM | POA: Diagnosis not present

## 2021-05-24 LAB — BASIC METABOLIC PANEL
Anion gap: 7 (ref 5–15)
BUN: 22 mg/dL (ref 8–23)
CO2: 29 mmol/L (ref 22–32)
Calcium: 8.6 mg/dL — ABNORMAL LOW (ref 8.9–10.3)
Chloride: 99 mmol/L (ref 98–111)
Creatinine, Ser: 1.26 mg/dL — ABNORMAL HIGH (ref 0.44–1.00)
GFR, Estimated: 39 mL/min — ABNORMAL LOW (ref >60)
Glucose, Bld: 107 mg/dL — ABNORMAL HIGH (ref 70–99)
Potassium: 3.3 mmol/L — ABNORMAL LOW (ref 3.5–5.1)
Sodium: 135 mmol/L (ref 135–145)

## 2021-05-24 MED ORDER — HYDRALAZINE HCL 25 MG PO TABS
25.0000 mg | ORAL_TABLET | Freq: Three times a day (TID) | ORAL | Status: DC
  Administered 2021-05-24 – 2021-05-27 (×10): 25 mg via ORAL
  Filled 2021-05-24 (×11): qty 1

## 2021-05-24 MED ORDER — POTASSIUM CHLORIDE 20 MEQ PO PACK
40.0000 meq | PACK | Freq: Once | ORAL | Status: AC
  Administered 2021-05-24: 40 meq via ORAL
  Filled 2021-05-24: qty 2

## 2021-05-24 NOTE — Consult Note (Signed)
CC: C2 fracture  HPI:     Patient is a 87 y.o. female who presented after a fall and was found to have a type 2 odontoid fracture with mild displacement.  On questioning, she complains of mild neck pain.  No focal neurologic deficit.  She has osteoporosis, CKD, pulmonary HTN and dementia.    Patient Active Problem List   Diagnosis Date Noted   Syncope 05/21/2021   Fall 05/21/2021   Closed posterior displaced Type II dens fracture ([place]) 05/21/2021   Bradycardia 05/21/2021   DNR (do not resuscitate) 05/21/2021   CKD (chronic kidney disease), stage III ([place]) 05/21/2021   Chronic venous insufficiency 09/19/2019   Pulmonary HTN ([place]) 03/15/2019   Dry eyes, bilateral 07/18/2018   Bilateral nonexudative age-related macular degeneration 07/18/2018   Paranoid delusion ([place]) 07/18/2018   Benign essential tremor 07/18/2018   Late onset Alzheimer's disease with behavioral disturbance ([place]) 04/23/2018   Senile osteoporosis 04/23/2018   Hoarding behavior 04/23/2018   Psychophysiological insomnia 04/23/2018   DDD (degenerative disc disease), lumbar 04/23/2018   Major depressive disorder 07/29/2016   Hiatal hernia with GERD 07/29/2011   Essential hypertension 07/29/2011   Hypothyroidism 07/29/2011   LBBB (left bundle branch block) 07/29/2011   Pure hypercholesterolemia 07/29/2011   Past Medical History:  Diagnosis Date   Benign essential tremor 07/18/2018   Bilateral nonexudative age-related macular degeneration 07/18/2018   Cholestatic hepatitis    2001   DDD (degenerative disc disease), lumbar 04/23/2018   Depression    Dry eyes, bilateral 07/18/2018   Fatigue    GERD (gastroesophageal reflux disease)    Hiatal hernia with GERD 07/29/2011   Hoarding behavior 04/23/2018   HTN (hypertension) 07/29/2011   Hyperlipidemia    Hypertension    Hypothyroidism 07/29/2011   Insomnia    Late onset Alzheimer's disease with behavioral disturbance ([place]) 04/23/2018   LBBB (left bundle branch block)  07/29/2011   Major depressive disorder 07/29/2016   Memory impairment 12/09/2016   Memory loss    Osteoporosis    Paranoid delusion ([place]) 07/18/2018   Psychophysiological insomnia 04/23/2018   Pulmonary HTN ([place]) 03/15/2019   2 d echo 02/28/19 EF 55-60 %, mild to moderate mitral valve regurg, mild tricuspid regurg, severely elevated estimated right ventricular systolic pressure 85 mm Hg, and mild to moderate aortic valve regurg. No diastolic CHF noted.    Pure hypercholesterolemia 07/29/2011   Sciatica    Senile osteoporosis 04/23/2018    Past Surgical History:  Procedure Laterality Date   APPENDECTOMY     CARDIAC CATHETERIZATION     FACIAL COSMETIC SURGERY     TONSILLECTOMY     TOTAL ABDOMINAL HYSTERECTOMY W/ BILATERAL SALPINGOOPHORECTOMY      Medications Prior to Admission  Medication Sig Dispense Refill Last Dose   Calcium Carbonate-Vitamin D 600-400 MG-UNIT chew tablet Chew 1 tablet by mouth 3 (three) times daily with meals. 60 tablet 3 05/20/2021   Cholecalciferol (D3 HIGH POTENCY) 2000 units CAPS Take 1 capsule (2,000 Units total) by mouth daily. 30 each 3 05/20/2021   diclofenac Sodium (VOLTAREN) 1 % GEL Apply 4 g topically 4 (four) times daily as needed. 500 g 6 unk   donepezil (ARICEPT) 10 MG tablet TAKE 1 TABLET(10 MG) BY MOUTH AT BEDTIME 90 tablet 1 05/20/2021   FLUoxetine (PROZAC) 20 MG tablet TAKE 1 TABLET(20 MG) BY MOUTH DAILY 90 tablet 1 05/20/2021   furosemide (LASIX) 40 MG tablet TAKE 1 TABLET(40 MG) BY MOUTH DAILY 90 tablet 1 05/20/2021  irbesartan (AVAPRO) 300 MG tablet TAKE 1 TABLET(300 MG) BY MOUTH DAILY 90 tablet 1 05/20/2021   levothyroxine (SYNTHROID) 25 MCG tablet TAKE 1 TABLET(25 MCG) BY MOUTH AT BEDTIME 90 tablet 1 05/20/2021   Melatonin 10 MG TABS Take 1 tablet by mouth at bedtime. 90 tablet 1 05/20/2021   methocarbamol (ROBAXIN) 500 MG tablet Take 0.5 tablets (250 mg total) by mouth every 8 (eight) hours as needed for muscle spasms. 30 tablet 0 unk   Multiple Vitamin  (MULTIVITAMIN WITH MINERALS) TABS tablet Take 1 tablet by mouth in the morning and at bedtime.   05/20/2021   omeprazole (PRILOSEC) 20 MG capsule TAKE 1 CAPSULE(20 MG) BY MOUTH DAILY 90 capsule 1 05/20/2021   potassium chloride SA (KLOR-CON) 20 MEQ tablet TAKE 1 TABLET BY MOUTH EVERY DAY 90 tablet 1 05/20/2021   propranolol ER (INDERAL LA) 60 MG 24 hr capsule TAKE 1 CAPSULE(60 MG) BY MOUTH DAILY 90 capsule 1 05/20/2021 at 0900   traZODone (DESYREL) 50 MG tablet TAKE 1 TABLET(50 MG) BY MOUTH AT BEDTIME 90 tablet 1 05/20/2021   vitamin C (ASCORBIC ACID) 500 MG tablet Take 1 tablet (500 mg total) by mouth daily. 30 tablet 11 05/20/2021   Allergies  Allergen Reactions   Garlic    Lac Bovis    Lanolin    Penicillins     ? rash   Statins     myalgia    Social History   Tobacco Use   Smoking status: Former    Packs/day: 0.50    Years: 20.00    Pack years: 10.00    Types: Cigarettes   Smokeless tobacco: Never  Substance Use Topics   Alcohol use: Yes    Alcohol/week: 1.0 standard drink    Types: 1 Glasses of wine per week    Comment: 5-6 times a month.    Family History  Problem Relation Age of Onset   Heart attack Father      Review of Systems Pertinent items noted in HPI and remainder of comprehensive ROS otherwise negative.  Objective:   Patient Vitals for the past 8 hrs:  BP Temp Temp src Pulse Resp  05/24/21 1100 (!) 152/64 -- -- -- --  05/24/21 1008 135/61 -- -- -- --  05/24/21 0847 (!) 177/63 -- -- -- --  05/24/21 0544 (!) 171/76 98.5 F (36.9 C) Oral 61 18   I/O last 3 completed shifts: In: 370 [P.O.:370] Out: 350 [Urine:350] Total I/O In: 320 [P.O.:320] Out: -       General : Alert, cooperative, no distress, appears stated age   Head:  Normocephalic/atraumatic    Eyes: PERRL, conjunctiva/corneas clear, EOM's intact. Fundi could not be visualized Neck: -C-collar in place Chest:  Respirations unlabored Chest wall: no tenderness or deformity Heart: Regular rate and  rhythm Abdomen: Soft, nontender and nondistended Extremities: warm and well-perfused Skin: normal turgor, color and texture Neurologic:  Alert, oriented x1.  Eyes open spontaneously. PERRL, EOMI, VFC, no facial droop. V1-3 intact.  No dysarthria, tongue protrusion symmetric.  CNII-XII intact. Normal strength, sensation and reflexes throughout.  No pronator drift, full strength in legs       Data ReviewCBC:  Lab Results  Component Value Date   WBC 9.9 05/23/2021   RBC 4.00 05/23/2021   BMP:  Lab Results  Component Value Date   GLUCOSE 107 (H) 05/24/2021   CO2 29 05/24/2021   BUN 22 05/24/2021   BUN 26 (A) 03/05/2019   CREATININE 1.26 (H)  05/24/2021   CREATININE 1.30 (H) 01/14/2021   CALCIUM 8.6 (L) 05/24/2021   Radiology review:  CT c-spine shows chronic odontoid pannus as well as linear fracture through base of dens with ~1-2 mm posterior displacement.  MRI confirms this is likely an acute fracture.    Assessment:   Principal Problem:   Fall Active Problems:   Essential hypertension   Hypothyroidism   Late onset Alzheimer's disease with behavioral disturbance ([place])   Syncope   Closed posterior displaced Type II dens fracture (HCC)   Bradycardia   DNR (do not resuscitate)   CKD (chronic kidney disease), stage III (HCC)  Type 2 odontoid fracture  Plan:  - no surgical intervention indicated at this time, and realistically, she is not a candidate for posterior C1-2 fusion even if her fracture worsens. - recommend wearing C-collar at all times. - She can f/u with Dr. Reatha Armour in neurosurgery clinic in 6 weeks with an x-ray. - discussed plan of care with patient's daughter-in-law at the bedside

## 2021-05-24 NOTE — Progress Notes (Signed)
PROGRESS NOTE    April Stevenson  [id number] DOB: 1924-06-12 DOA: 05/21/2021 PCP: Sandrea Hughs, NP    Brief Narrative: This 87 years old female with PMH significant for hypertension, hyperlipidemia, hypothyroidism, pulmonary hypertension, Alzheimer's dementia presented after having an unwitnessed fall in the nursing home.  History is obtained from patient's son over the phone and review of records.  Patient had been found on the floor by her bedroom after attempting to go to the bathroom on her own.  She has significant dementia and only oriented to self but she can ambulate without assistance.  She has been living at home with her son for last 62-monthbut had been temporarily moved to [facility] home while son is currently undergoing treatment for leukemia.  Patient denies any complaint at this time. CT head and cervical spine was significant for mildly displaced type II fracture of the dens and anterior posterior subluxation of C1 and C2.  Neurosurgery consulted recommended no surgical intervention.  Assessment & Plan:   Principal Problem:   Fall Active Problems:   Essential hypertension   Hypothyroidism   Late onset Alzheimer's disease with behavioral disturbance ([place]   Syncope   Closed posterior displaced Type II dens fracture (HCC)   Bradycardia   DNR (do not resuscitate)   CKD (chronic kidney disease), stage III ([facility]   Syncope s/p fall: Patient presented after having a unwitnessed fall at the nursing facility.   At baseline patient does not usually use a walker or assistive aid to get around.   Unclear if patient had a mechanical fall or had a syncopal episode as patient was noted to be bradycardic, but blood pressures appear to be maintained. Continue telemetry, check orthostatic hypotension Hold home medication which reduces to heart rate.  Dens type II fracture:   Patient was noted to have a dens type II fracture of the cervical spine that was  seen on initial imaging of unclear age.   Continue cervical collar MRI C-spine:  C2 fracture with moderate edema, but no prevertebral effusion or hematoma. The findings remain somewhat equivocal, but favored to be acute or recent subacute. Neurosurgery consulted, no surgical intervention indicated at this time.  She is not a candidate for C1-C2 fusion if her fracture worsens.  Continue wearing c-collar at all times.   Leukocytosis: > Resolved. Chest x-ray unremarkable.   Patient remained afebrile it could be reactive.   Recheck labs shows normal WBC.  Sinus bradycardia:  On admission patient's heart rates were noted to be anywhere from 37-58, but blood pressures are currently maintained.   Home blood pressure medications include propranolol 60 mg daily, irbesartan 300 mg daily, and furosemide 40 mg daily. Hold propranolol due to bradycardia Continue telemetry and give atropine if needed for signs of hemodynamic instability. Heart rate now improved.   Dementia, with behavioral disturbance:  Patient has significant dementia and is only oriented to self.  While in the hospital patient was noted to be getting up out of bed and is at risk of falls. Continue delirium precaution Continue sitter to bedside Hold Aricept as it could possibly worsen bradycardia   Essential hypertension: Blood pressures were noted to be elevated up to 183/81.  Home blood pressure medications include furosemide 40 mg daily, irbesartan 300 mg daily, and propranolol 60 mg daily. Continue Irbesartan Hold furosemide due to possible dehydration with elevated BUN to creatinine ratio Hold propranolol due to bradycardia Continue Hydralazine IV as needed for elevated systolic blood pressure greater  than 180.   Hypothyroidism Continue levothyroxine   Chronic kidney disease stage IIIa:  Serum creatinine at baseline. Continue to monitor   GERD Continue pantoprazole.     DVT prophylaxis: Lovenox. Code Status:  DNR Family Communication: No family at bed side. Disposition Plan:   Status is: Observation  The patient remains OBS appropriate and will d/c before 2 midnights.  Dispo: The patient is from: Home              Anticipated d/c is to: SNF              Patient currently is not medically stable to d/c.   Difficult to place patient No    Consultants:  Neurosurgery  Procedures: Antimicrobials:  Anti-infectives (From admission, onward)    None     Subjective: Patient is seen and examined at bedside.  Overnight events noted. Patient is sitting comfortably in the recliner with cervical collar in neck. She reports pain is better controlled.  she was having physical therapy session.  She denies any numbness.    Objective: Vitals:   05/24/21 0847 05/24/21 1008 05/24/21 1100 05/24/21 1419  BP: (!) 177/63 135/61 (!) 152/64   Pulse:      Resp:      Temp:    98.3 F (36.8 C)  TempSrc:    Oral  SpO2:      Weight:      Height:        Intake/Output Summary (Last 24 hours) at 05/24/2021 1445 Last data filed at 05/24/2021 1419 Gross per 24 hour  Intake 450 ml  Output --  Net 450 ml    Filed Weights   05/21/21 0236  Weight: 64.1 kg    Examination:  General exam: Appears calm and comfortable , not in any acute distress, in cervical collar. Respiratory system: Clear to auscultation, respiratory effort normal, RR 15 Cardiovascular system: S1 & S2 heard, RRR. No JVD, murmurs, rubs, gallops or clicks. No pedal edema. Gastrointestinal system: Abdomen is nondistended, soft and nontender. No organomegaly or masses felt. Normal bowel sounds heard. Central nervous system: Alert and oriented x 3. No focal neurological deficits.  Neck tenderness+ Extremities: No edema, no cyanosis, no clubbing. Skin: No rashes, lesions or ulcers Psychiatry: Judgement and insight appear normal. Mood & affect appropriate.     Data Reviewed: I have personally reviewed following labs and imaging  studies  CBC: Recent Labs  Lab 05/21/21 0358 05/22/21 0226 05/23/21 0148  WBC 11.7* 8.3 9.9  NEUTROABS 9.7*  --   --   HGB 12.3 11.4* 12.5  HCT 37.2 34.4* 37.4  MCV 94.4 94.8 93.5  PLT 187 182 [id number]    Basic Metabolic Panel: Recent Labs  Lab 05/21/21 0358 05/22/21 0226 05/23/21 0148 05/24/21 0113  NA 140 134* 136 135  K 4.1 3.3* 3.6 3.3*  CL 98 96* 98 99  CO2 33* '30 29 29  '$ GLUCOSE 115* 104* 93 107*  BUN 25* '18 21 22  '$ CREATININE 1.10* 1.04* 1.25* 1.26*  CALCIUM 9.3 8.9 9.0 8.6*  MG  --   --  1.7  --   PHOS  --   --  3.7  --     GFR: Estimated Creatinine Clearance: 27 mL/min (A) (by C-G formula based on SCr of 1.26 mg/dL (H)). Liver Function Tests: Recent Labs  Lab 05/21/21 0358 05/23/21 0148  AST 25 19  ALT 12 15  ALKPHOS 105 104  BILITOT 0.8 0.9  PROT 6.6 6.1*  ALBUMIN 3.5 3.0*    No results for input(s): LIPASE, AMYLASE in the last 168 hours. No results for input(s): AMMONIA in the last 168 hours. Coagulation Profile: No results for input(s): INR, PROTIME in the last 168 hours. Cardiac Enzymes: No results for input(s): CKTOTAL, CKMB, CKMBINDEX, TROPONINI in the last 168 hours. BNP (last 3 results) No results for input(s): PROBNP in the last 8760 hours. HbA1C: No results for input(s): HGBA1C in the last 72 hours. CBG: No results for input(s): GLUCAP in the last 168 hours. Lipid Profile: No results for input(s): CHOL, HDL, LDLCALC, TRIG, CHOLHDL, LDLDIRECT in the last 72 hours. Thyroid Function Tests: No results for input(s): TSH, T4TOTAL, FREET4, T3FREE, THYROIDAB in the last 72 hours.  Anemia Panel: No results for input(s): VITAMINB12, FOLATE, FERRITIN, TIBC, IRON, RETICCTPCT in the last 72 hours. Sepsis Labs: No results for input(s): PROCALCITON, LATICACIDVEN in the last 168 hours.  Recent Results (from the past 240 hour(s))  Resp Panel by RT-PCR (Flu A&B, Covid) Nasopharyngeal Swab     Status: None   Collection Time: 05/21/21  4:51 AM    Specimen: Nasopharyngeal Swab; Nasopharyngeal(NP) swabs in vial transport medium  Result Value Ref Range Status   SARS Coronavirus 2 by RT PCR NEGATIVE NEGATIVE Final    Comment: (NOTE) SARS-CoV-2 target nucleic acids are NOT DETECTED.  The SARS-CoV-2 RNA is generally detectable in upper respiratory specimens during the acute phase of infection. The lowest concentration of SARS-CoV-2 viral copies this assay can detect is 138 copies/mL. A negative result does not preclude SARS-Cov-2 infection and should not be used as the sole basis for treatment or other patient management decisions. A negative result may occur with  improper specimen collection/handling, submission of specimen other than nasopharyngeal swab, presence of viral mutation(s) within the areas targeted by this assay, and inadequate number of viral copies(<138 copies/mL). A negative result must be combined with clinical observations, patient history, and epidemiological information. The expected result is Negative.  Fact Sheet for Patients:  EntrepreneurPulse.com.au  Fact Sheet for Healthcare Providers:  IncredibleEmployment.be  This test is no t yet approved or cleared by the [place] FDA and  has been authorized for detection and/or diagnosis of SARS-CoV-2 by FDA under an Emergency Use Authorization (EUA). This EUA will remain  in effect (meaning this test can be used) for the duration of the COVID-19 declaration under Section 564(b)(1) of the Act, 21 U.S.C.section 360bbb-3(b)(1), unless the authorization is terminated  or revoked sooner.       Influenza A by PCR NEGATIVE NEGATIVE Final   Influenza B by PCR NEGATIVE NEGATIVE Final    Comment: (NOTE) The Xpert Xpress SARS-CoV-2/FLU/RSV plus assay is intended as an aid in the diagnosis of influenza from Nasopharyngeal swab specimens and should not be used as a sole basis for treatment. Nasal washings and aspirates are  unacceptable for Xpert Xpress SARS-CoV-2/FLU/RSV testing.  Fact Sheet for Patients: EntrepreneurPulse.com.au  Fact Sheet for Healthcare Providers: IncredibleEmployment.be  This test is not yet approved or cleared by the [place] FDA and has been authorized for detection and/or diagnosis of SARS-CoV-2 by FDA under an Emergency Use Authorization (EUA). This EUA will remain in effect (meaning this test can be used) for the duration of the COVID-19 declaration under Section 564(b)(1) of the Act, 21 U.S.C. section 360bbb-3(b)(1), unless the authorization is terminated or revoked.  Performed at [facility], [address], [place], [place] [address]      Radiology Studies: No results found.  Scheduled Meds:  enoxaparin (LOVENOX) injection  30 mg Subcutaneous Q24H   FLUoxetine  20 mg Oral Daily   hydrALAZINE  25 mg Oral Q8H   irbesartan  300 mg Oral Daily   levothyroxine  25 mcg Oral Q0600   melatonin  10 mg Oral QHS   pantoprazole  40 mg Oral Daily   sodium chloride flush  3 mL Intravenous Q12H   traZODone  50 mg Oral QHS   Continuous Infusions:   LOS: 0 days    Time spent: 25 mins    Shawna Clamp, MD [facility]   If 7PM-7AM, please contact night-coverage

## 2021-05-24 NOTE — Evaluation (Signed)
Physical Therapy Evaluation Patient Details Name: April Stevenson MRN: [id number] DOB: August 16, 1924 Today's Date: 05/24/2021   History of Present Illness  Pt is a 87 year old female who presented 8/5 due to fall at ALF where she was staying for respite care while her son who she lives with was undergoing treatment for Leukemia. Pt found to have mildly displaced type II fracture of the dens and anterior posterior subluxation of C1 on C2. Neurosurgery consulted and pt to wear cervical collar and no surgical intervention.PMH - hypertension, hyperlipidemia, hypothyroidism, pulmonary hypertension, Alzheimer's dementia  Clinical Impression  Pt presents now requiring assistance with gait. Using rollator improved gait stability but pt will still need supervision/assist with mobility when she returns home. Per granddaughter it sounds like pt will have assist at home of daughter in law who has experience as a caregiver.     Follow Up Recommendations Home health PT;Supervision/Assistance - 24 hour    Equipment Recommendations  Other (comment) (rollator)    Recommendations for Other Services       Precautions / Restrictions Precautions Precautions: Fall;Cervical Required Braces or Orthoses: Cervical Brace Cervical Brace: Hard collar;At all times      Mobility  Bed Mobility Overal bed mobility: Needs Assistance Bed Mobility: Sit to Supine       Sit to supine: Min guard   General bed mobility comments: Assist for safety and lines    Transfers Overall transfer level: Needs assistance Equipment used: 2 person hand held assist;4-wheeled walker Transfers: Sit to/from Stand Sit to Stand: Min assist;Min guard         General transfer comment: Assist to bring hips up from low recliner. From elevated commode seat only min guard for safety  Ambulation/Gait Ambulation/Gait assistance: Min assist Gait Distance (Feet): 100 Feet Assistive device: 2 person hand held assist;4-wheeled  walker;Rolling walker (2 wheeled) Gait Pattern/deviations: Step-through pattern;Decreased step length - right;Decreased step length - left;Shuffle Gait velocity: decr Gait velocity interpretation: <1.31 ft/sec, indicative of household ambulator General Gait Details: Pt required +2 min assist using bilateral hand held if not using assistive device. With rollator pt required min guard to amb and min assist to unlock brakes. With rolling walker pt requred min guard but with difficulty moving walker at times.  Stairs            Wheelchair Mobility    Modified Rankin (Stroke Patients Only)       Balance Overall balance assessment: Needs assistance Sitting-balance support: No upper extremity supported Sitting balance-Leahy Scale: Good     Standing balance support: Single extremity supported Standing balance-Leahy Scale: Poor Standing balance comment: Needs UE support for static standing                             Pertinent Vitals/Pain Pain Assessment: Faces Faces Pain Scale: No hurt    Home Living Family/patient expects to be discharged to:: Private residence Living Arrangements: Children Available Help at Discharge: Family;Available 24 hours/day Type of Home: House           Additional Comments: Per chart pt live in home with son but they required medical treatment so the pt went to [facility] where they had a fall. Granddaughter present and confirms that pt was ambulatory without assistive device but is unsure if there are any devices available at home.    Prior Function Level of Independence: Needs assistance   Gait / Transfers Assistance Needed: was ambulating independently without assistive  device  ADL's / Homemaking Assistance Needed: Needed assist        Hand Dominance   Dominant Hand: Right    Extremity/Trunk Assessment   Upper Extremity Assessment Upper Extremity Assessment: Defer to OT evaluation    Lower Extremity  Assessment Lower Extremity Assessment: Generalized weakness    Cervical / Trunk Assessment Cervical / Trunk Assessment: Kyphotic  Communication   Communication: No difficulties  Cognition Arousal/Alertness: Awake/alert Behavior During Therapy: Flat affect Overall Cognitive Status: History of cognitive impairments - at baseline                                 General Comments: Pt with Alzheimer's      General Comments General comments (skin integrity, edema, etc.): VSS on RA    Exercises     Assessment/Plan    PT Assessment Patient needs continued PT services  PT Problem List Decreased strength;Decreased balance;Decreased mobility;Decreased knowledge of use of DME;Decreased activity tolerance       PT Treatment Interventions DME instruction;Functional mobility training;Balance training;Patient/family education;Gait training;Therapeutic activities;Therapeutic exercise    PT Goals (Current goals can be found in the Care Plan section)  Acute Rehab PT Goals PT Goal Formulation: With family Time For Goal Achievement: 06/07/21 Potential to Achieve Goals: Good    Frequency Min 3X/week   Barriers to discharge        Co-evaluation               AM-PAC PT "6 Clicks" Mobility  Outcome Measure Help needed turning from your back to your side while in a flat bed without using bedrails?: A Little Help needed moving from lying on your back to sitting on the side of a flat bed without using bedrails?: A Little Help needed moving to and from a bed to a chair (including a wheelchair)?: A Little Help needed standing up from a chair using your arms (e.g., wheelchair or bedside chair)?: A Little Help needed to walk in hospital room?: A Little Help needed climbing 3-5 steps with a railing? : A Little 6 Click Score: 18    End of Session Equipment Utilized During Treatment: Gait belt;Cervical collar Activity Tolerance: Patient tolerated treatment well Patient left:  in bed;with call bell/phone within reach;with bed alarm set;with nursing/sitter in room;with family/visitor present Nurse Communication: Mobility status PT Visit Diagnosis: Unsteadiness on feet (R26.81);Other abnormalities of gait and mobility (R26.89)    Time: [id number] PT Time Calculation (min) (ACUTE ONLY): 31 min   Charges:   PT Evaluation $PT Eval Moderate Complexity: [place] Pager [phone] Office [place] 05/24/2021, 3:39 PM

## 2021-05-24 NOTE — Evaluation (Signed)
Occupational Therapy Evaluation Patient Details Name: April Stevenson MRN: [id number] DOB: 1924/09/18 Today's Date: 05/24/2021    History of Present Illness Pt is a 87 year old female who presented due to fall. Pt found to have mildly displaced type II fracture of the dens and anterior posterior subluxation of C1 on C2. PMH but not limited to: hypertension, hyperlipidemia, hypothyroidism, pulmonary hypertension, Alzheimer's dementia   Clinical Impression   Pt admitted due to above. PT at this time was able to report name and they were in a hospital. Pt was able to follow one step commands in session but less then 5 mins recall. Pt was able to complete bed mobility with HOB elevated, bed rails and min assist and sit to stand from elevated surfaces with min assist with max cues for hand position to walker. Pt transferred to [facility] with min assist with single hand support but on tiral with walker increase in positioning and then required max assist for hygiene. Pt did not urinate in session but repeated the need to urinate. Pt currently with functional limitations due to the deficits listed below (see OT Problem List).  Pt will benefit from skilled OT to increase their safety and independence with ADL and functional mobility for ADL to facilitate discharge to venue listed below.      Follow Up Recommendations  SNF;Supervision/Assistance - 24 hour Depending if son can be present in home may benefit from [facility] therapy due to home environment    Equipment Recommendations       Recommendations for Other Services       Precautions / Restrictions Precautions Precautions: Fall;Cervical Precaution Comments: falls Required Braces or Orthoses: Cervical Brace Cervical Brace: Hard collar Restrictions Weight Bearing Restrictions: No      Mobility Bed Mobility Overal bed mobility: Needs Assistance Bed Mobility: Rolling;Supine to Sit Rolling: Min assist   Supine to sit: Min assist;HOB  elevated     General bed mobility comments: Pt requires max cues for log rolling    Transfers Overall transfer level: Needs assistance Equipment used: Rolling walker (2 wheeled);1 person hand held assist Transfers: Sit to/from Stand Sit to Stand: Min assist         General transfer comment: as pt completed more transfers increase in ability to complete with decrase in assistance    Balance Overall balance assessment: Mild deficits observed, not formally tested                                         ADL either performed or assessed with clinical judgement   ADL Overall ADL's : Needs assistance/impaired Eating/Feeding: Set up;Cueing for safety;Cueing for sequencing;Sitting   Grooming: Wash/dry hands;Wash/dry face;Set up;Sitting;Cueing for safety;Cueing for sequencing   Upper Body Bathing: Cueing for safety;Cueing for sequencing;Sitting;Minimal assistance   Lower Body Bathing: Cueing for safety;Cueing for sequencing;Sit to/from stand;Moderate assistance   Upper Body Dressing : Sitting;Cueing for safety;Cueing for sequencing;Minimal assistance   Lower Body Dressing: Moderate assistance;Cueing for sequencing;Cueing for safety;Sit to/from stand   Toilet Transfer: Minimal assistance;Ambulation;RW;BSC   Toileting- Clothing Manipulation and Hygiene: Maximal assistance;Cueing for safety;Cueing for sequencing;Sit to/from stand       Functional mobility during ADLs: Minimal assistance;Cueing for safety;Cueing for sequencing;Rolling walker General ADL Comments: Pt reporting that they needed to urnate even after trialing on commode     Vision Baseline Vision/History: Wears glasses Wears Glasses: Reading only  Perception     Praxis      Pertinent Vitals/Pain Pain Assessment: Faces Faces Pain Scale: Hurts a little bit Pain Location: back Pain Descriptors / Indicators: Aching Pain Intervention(s): Limited activity within patient's tolerance;Monitored  during session;Repositioned     Hand Dominance Right   Extremity/Trunk Assessment Upper Extremity Assessment Upper Extremity Assessment: Generalized weakness   Lower Extremity Assessment Lower Extremity Assessment: Defer to PT evaluation   Cervical / Trunk Assessment Cervical / Trunk Assessment: Kyphotic   Communication Communication Communication: No difficulties   Cognition Arousal/Alertness: Awake/alert Behavior During Therapy: Flat affect Overall Cognitive Status: No family/caregiver present to determine baseline cognitive functioning                                 General Comments: pt has decrease short term memory and decrease orientation   General Comments       Exercises     Shoulder Instructions      Home Living Family/patient expects to be discharged to:: Private residence Living Arrangements: Spouse/significant other Available Help at Discharge: Family Type of Home: House                           Additional Comments: Per chart pt live in home with son but they required medical treatment so the pt went to [facility] where they had a fall. Per reports not using any assisted devices      Prior Functioning/Environment Level of Independence: Needs assistance  Gait / Transfers Assistance Needed: no reported AE ADL's / Homemaking Assistance Needed: Pt requires care of son but unsure level            OT Problem List: Decreased strength;Decreased activity tolerance;Impaired balance (sitting and/or standing);Pain      OT Treatment/Interventions: Self-care/ADL training;Therapeutic exercise;Energy conservation;Therapeutic activities;Patient/family education;Balance training;Cognitive remediation/compensation    OT Goals(Current goals can be found in the care plan section) Acute Rehab OT Goals Patient Stated Goal: none reproted OT Goal Formulation: With patient Time For Goal Achievement: 06/12/21 Potential to Achieve Goals:  Good ADL Goals Pt Will Perform Upper Body Dressing: with supervision;sitting Pt Will Perform Lower Body Dressing: with supervision;sit to/from stand Pt Will Transfer to Toilet: with supervision;ambulating;bedside commode;grab bars Pt Will Perform Tub/Shower Transfer: Shower transfer;with min guard assist;ambulating;shower seat;grab bars;rolling walker  OT Frequency: Min 2X/week   Barriers to D/C:            Co-evaluation              AM-PAC OT "6 Clicks" Daily Activity     Outcome Measure Help from another person eating meals?: A Little Help from another person taking care of personal grooming?: A Little Help from another person toileting, which includes using toliet, bedpan, or urinal?: A Lot Help from another person bathing (including washing, rinsing, drying)?: A Lot Help from another person to put on and taking off regular upper body clothing?: A Little Help from another person to put on and taking off regular lower body clothing?: A Lot 6 Click Score: 15   End of Session Equipment Utilized During Treatment: Gait belt;Rolling walker;Cervical collar Nurse Communication: Mobility status  Activity Tolerance: Patient tolerated treatment well Patient left: in chair;with call bell/phone within reach;with nursing/sitter in room  OT Visit Diagnosis: Unsteadiness on feet (R26.81);Other abnormalities of gait and mobility (R26.89);History of falling (Z91.81);Pain Pain - part of body:  (back)  [id number] OT Time Calculation (min): 32 min Charges:  OT General Charges $OT Visit: 1 Visit OT Evaluation $OT Eval Low Complexity: 1 Low OT Treatments $Self Care/Home Management : 8-22 mins  Joeseph Amor OTR/L  Acute Rehab Services  [phone] office number [phone] pager number   Joeseph Amor 05/24/2021, 9:06 AM

## 2021-05-25 ENCOUNTER — Other Ambulatory Visit: Payer: Self-pay

## 2021-05-25 DIAGNOSIS — W19XXXA Unspecified fall, initial encounter: Secondary | ICD-10-CM | POA: Diagnosis not present

## 2021-05-25 DIAGNOSIS — I10 Essential (primary) hypertension: Secondary | ICD-10-CM | POA: Diagnosis not present

## 2021-05-25 DIAGNOSIS — S12111A Posterior displaced Type II dens fracture, initial encounter for closed fracture: Secondary | ICD-10-CM | POA: Diagnosis not present

## 2021-05-25 LAB — BASIC METABOLIC PANEL
Anion gap: 7 (ref 5–15)
BUN: 19 mg/dL (ref 8–23)
CO2: 28 mmol/L (ref 22–32)
Calcium: 8.8 mg/dL — ABNORMAL LOW (ref 8.9–10.3)
Chloride: 100 mmol/L (ref 98–111)
Creatinine, Ser: 1.15 mg/dL — ABNORMAL HIGH (ref 0.44–1.00)
GFR, Estimated: 44 mL/min — ABNORMAL LOW (ref >60)
Glucose, Bld: 106 mg/dL — ABNORMAL HIGH (ref 70–99)
Potassium: 3.5 mmol/L (ref 3.5–5.1)
Sodium: 135 mmol/L (ref 135–145)

## 2021-05-25 MED ORDER — HYDRALAZINE HCL 25 MG PO TABS: 25.0000 mg | ORAL_TABLET | Freq: Three times a day (TID) | ORAL | 1 refills | Status: DC

## 2021-05-25 NOTE — Discharge Summary (Signed)
Physician Discharge Summary  April Stevenson [id number] DOB: 23-Nov-1923 DOA: 05/21/2021  PCP: Sandrea Hughs, NP  Admit date: 05/21/2021  Discharge date: 05/25/2021  Admitted From: Home.  Disposition:  Home Health services . Home PT  Recommendations for Outpatient Follow-up:  Follow up with PCP in 1-2 weeks. Please obtain BMP/CBC in one week. Advised to follow-up with Dr. Franchot Mimes in 4 to 6 weeks for follow-up. 4.   Advised to wear cervical collar at all times. Propranolol is discontinued because of bradycardia.  Home Health:Home PT Equipment/Devices:None  Discharge Condition: Good CODE STATUS: DNR Diet recommendation: Heart Healthy  Brief Summary / Hospital Course: This 87 years old female with PMH significant for hypertension, hyperlipidemia, hypothyroidism, pulmonary hypertension, Alzheimer's dementia presented after having an unwitnessed fall in the nursing home.  History is obtained from patient's son over the phone and review of records.  Patient had been found on the floor by her bed after attempting to go to the bathroom on her own.  She has significant dementia and only oriented to self but she can ambulate without assistance.  She had been living at home with her son for last 41-monthbut had been temporarily moved to [facility] while son is currently undergoing treatment for leukemia.  Patient denies any complaint at this time. Patient was admitted s/p fall. CT head and cervical spine was significant for mildly displaced type II fracture of the dens and anterior posterior subluxation of C1 and C2.  Neurosurgery was consulted recommended no surgical intervention.  Patient is not a candidate for surgical intervention,  recommended [place] collar at all times,  adequate pain control.  Patient was found to have bradycardia on admission.  Propranolol was discontinued.  Heart rate has improved.  Patient feels better , reports pain is controlled.   PT has  recommended home health services home PT.  Patient feels better and wants to be discharged.  She was managed for below problems.   Discharge Diagnoses:  Principal Problem:   Fall Active Problems:   Essential hypertension   Hypothyroidism   Late onset Alzheimer's disease with behavioral disturbance ([facility]   Syncope   Closed posterior displaced Type II dens fracture (HCC)   Bradycardia   DNR (do not resuscitate)   CKD (chronic kidney disease), stage III ([facility]  Syncope s/p fall: Patient presented after having a unwitnessed fall at the nursing facility.   At baseline patient does not usually use a walker or assistive aid to get around.   Unclear if patient had a mechanical fall or had a syncopal episode as patient was noted to be bradycardic, but blood pressures appear to be maintained. Continue telemetry, check orthostatic hypotension Hold home medication which reduces to heart rate.   Dens type II fracture:   Patient was noted to have a dens type II fracture of the cervical spine that was seen on initial imaging of unclear age.   Continue cervical collar MRI C-spine:  C2 fracture with moderate edema, but no prevertebral effusion or hematoma. The findings remain somewhat equivocal, but favored to be acute or recent subacute. Neurosurgery consulted, no surgical intervention indicated at this time.  She is not a candidate for C1-C2 fusion if her fracture worsens.  Continue wearing c-collar at all times.   Leukocytosis: > Resolved. Chest x-ray unremarkable.   Patient remained afebrile it could be reactive.   Recheck labs shows normal WBC.   Sinus bradycardia:  Improved. On admission patient's heart rates were noted to be  anywhere from 37-58, but blood pressures are currently maintained.   Home blood pressure medications include propranolol 60 mg daily, irbesartan 300 mg daily, and furosemide 40 mg daily. Hold propranolol due to bradycardia Continue telemetry and give atropine if  needed for signs of hemodynamic instability. Heart rate now improved.   Dementia, with behavioral disturbance:  Patient has significant dementia and is only oriented to self.   Continue delirium precaution Continue sitter to bedside Hold Aricept as it could possibly worsen bradycardia   Essential hypertension: Blood pressures were noted to be elevated up to 183/81.   Home blood pressure medications include furosemide 40 mg daily, irbesartan 300 mg daily, and propranolol 60 mg daily. Continue Irbesartan Hold furosemide due to possible dehydration with elevated BUN to creatinine ratio Hold propranolol due to bradycardia Continue Hydralazine IV as needed for elevated systolic blood pressure greater than 180.   Hypothyroidism Continue levothyroxine   Chronic kidney disease stage IIIa:  Serum creatinine at baseline. Continue to monitor   GERD Continue pantoprazole.    Discharge Instructions  Discharge Instructions     Call MD for:  difficulty breathing, headache or visual disturbances   Complete by: As directed    Call MD for:  persistant dizziness or light-headedness   Complete by: As directed    Call MD for:  persistant nausea and vomiting   Complete by: As directed    Call MD for:  severe uncontrolled pain   Complete by: As directed    Diet - low sodium heart healthy   Complete by: As directed    Diet Carb Modified   Complete by: As directed    Increase activity slowly   Complete by: As directed    No wound care   Complete by: As directed       Allergies as of 05/25/2021       Reactions   Garlic    Lac Bovis    Lanolin    Penicillins    ? rash   Statins    myalgia        Medication List     STOP taking these medications    propranolol ER 60 MG 24 hr capsule Commonly known as: INDERAL LA       TAKE these medications    Calcium Carbonate-Vitamin D 600-400 MG-UNIT chew tablet Chew 1 tablet by mouth 3 (three) times daily with meals.    Cholecalciferol 50 MCG (2000 UT) Caps Commonly known as: D3 High Potency Take 1 capsule (2,000 Units total) by mouth daily.   diclofenac Sodium 1 % Gel Commonly known as: Voltaren Apply 4 g topically 4 (four) times daily as needed.   donepezil 10 MG tablet Commonly known as: ARICEPT TAKE 1 TABLET(10 MG) BY MOUTH AT BEDTIME   FLUoxetine 20 MG tablet Commonly known as: PROZAC TAKE 1 TABLET(20 MG) BY MOUTH DAILY   furosemide 40 MG tablet Commonly known as: LASIX TAKE 1 TABLET(40 MG) BY MOUTH DAILY   hydrALAZINE 25 MG tablet Commonly known as: APRESOLINE Take 1 tablet (25 mg total) by mouth every 8 (eight) hours.   irbesartan 300 MG tablet Commonly known as: AVAPRO TAKE 1 TABLET(300 MG) BY MOUTH DAILY   levothyroxine 25 MCG tablet Commonly known as: SYNTHROID TAKE 1 TABLET(25 MCG) BY MOUTH AT BEDTIME   Melatonin 10 MG Tabs Take 1 tablet by mouth at bedtime.   methocarbamol 500 MG tablet Commonly known as: Robaxin Take 0.5 tablets (250 mg total) by mouth every 8 (eight)  hours as needed for muscle spasms.   multivitamin with minerals Tabs tablet Take 1 tablet by mouth in the morning and at bedtime.   omeprazole 20 MG capsule Commonly known as: PRILOSEC TAKE 1 CAPSULE(20 MG) BY MOUTH DAILY   potassium chloride SA 20 MEQ tablet Commonly known as: KLOR-CON TAKE 1 TABLET BY MOUTH EVERY DAY   traZODone 50 MG tablet Commonly known as: DESYREL TAKE 1 TABLET(50 MG) BY MOUTH AT BEDTIME   vitamin C 500 MG tablet Commonly known as: ASCORBIC ACID Take 1 tablet (500 mg total) by mouth daily.        Follow-up Information     Dawley, Troy C, DO. Schedule an appointment as soon as possible for a visit in 6 week(s).   Contact information: [address] [place] [address] [phone]         Ngetich, Dinah C, NP Follow up in 1 week(s).   Specialty: Family Medicine Contact information: [place] [address] [phone]          Jerline Pain, MD .   Specialty: Cardiology Contact information: [phone] [place] [address] [phone]                Allergies  Allergen Reactions   Garlic    Lac Bovis    Lanolin    Penicillins     ? rash   Statins     myalgia    Consultations: Neurosurgery   Procedures/Studies: CT HEAD WO CONTRAST (5MM)  Result Date: 05/21/2021 CLINICAL DATA:  Fall EXAM: CT HEAD WITHOUT CONTRAST CT CERVICAL SPINE WITHOUT CONTRAST TECHNIQUE: Multidetector CT imaging of the head and cervical spine was performed following the standard protocol without intravenous contrast. Multiplanar CT image reconstructions of the cervical spine were also generated. COMPARISON:  None. FINDINGS: CT HEAD FINDINGS Brain: There is no mass, hemorrhage or extra-axial collection. There is generalized atrophy without lobar predilection. There is hypoattenuation of the periventricular white matter, most commonly indicating chronic ischemic microangiopathy. Vascular: No abnormal hyperdensity of the major intracranial arteries or dural venous sinuses. No intracranial atherosclerosis. Skull: The visualized skull base, calvarium and extracranial soft tissues are normal. Sinuses/Orbits: No fluid levels or advanced mucosal thickening of the visualized paranasal sinuses. No mastoid or middle ear effusion. The orbits are normal. CT CERVICAL SPINE FINDINGS Alignment: Mild posterior subluxation C1 on C2. Skull base and vertebrae: Mildly displaced type 2 fracture of the dens, of uncertain age. Soft tissues and spinal canal: No prevertebral fluid or swelling. No visible canal hematoma. Disc levels: Moderate upper cervical left facet arthrosis. Upper chest: No pneumothorax, pulmonary nodule or pleural effusion. Other: Normal visualized paraspinal cervical soft tissues. IMPRESSION: 1. Chronic ischemic microangiopathy and generalized atrophy without acute intracranial abnormality. 2. Mildly displaced type  2 fracture of the dens with mild posterior subluxation C1 on C2. Acuity is uncertain. MRI recommended for further characterization. Critical Value/emergent results were called by telephone at the time of interpretation on 05/21/2021 at 3:32 am to provider [facility] , who verbally acknowledged these results. Electronically Signed   By: Ulyses Jarred M.D.   On: 05/21/2021 03:37   CT Cervical Spine Wo Contrast  Result Date: 05/21/2021 CLINICAL DATA:  Fall EXAM: CT HEAD WITHOUT CONTRAST CT CERVICAL SPINE WITHOUT CONTRAST TECHNIQUE: Multidetector CT imaging of the head and cervical spine was performed following the standard protocol without intravenous contrast. Multiplanar CT image reconstructions of the cervical spine were also generated.  COMPARISON:  None. FINDINGS: CT HEAD FINDINGS Brain: There is no mass, hemorrhage or extra-axial collection. There is generalized atrophy without lobar predilection. There is hypoattenuation of the periventricular white matter, most commonly indicating chronic ischemic microangiopathy. Vascular: No abnormal hyperdensity of the major intracranial arteries or dural venous sinuses. No intracranial atherosclerosis. Skull: The visualized skull base, calvarium and extracranial soft tissues are normal. Sinuses/Orbits: No fluid levels or advanced mucosal thickening of the visualized paranasal sinuses. No mastoid or middle ear effusion. The orbits are normal. CT CERVICAL SPINE FINDINGS Alignment: Mild posterior subluxation C1 on C2. Skull base and vertebrae: Mildly displaced type 2 fracture of the dens, of uncertain age. Soft tissues and spinal canal: No prevertebral fluid or swelling. No visible canal hematoma. Disc levels: Moderate upper cervical left facet arthrosis. Upper chest: No pneumothorax, pulmonary nodule or pleural effusion. Other: Normal visualized paraspinal cervical soft tissues. IMPRESSION: 1. Chronic ischemic microangiopathy and generalized atrophy without acute  intracranial abnormality. 2. Mildly displaced type 2 fracture of the dens with mild posterior subluxation C1 on C2. Acuity is uncertain. MRI recommended for further characterization. Critical Value/emergent results were called by telephone at the time of interpretation on 05/21/2021 at 3:32 am to provider [facility] , who verbally acknowledged these results. Electronically Signed   By: Ulyses Jarred M.D.   On: 05/21/2021 03:37   MR Cervical Spine Wo Contrast  Result Date: 05/21/2021 CLINICAL DATA:  Age-indeterminate spinal fracture EXAM: MRI CERVICAL SPINE WITHOUT CONTRAST TECHNIQUE: Multiplanar, multisequence MR imaging of the cervical spine was performed. No intravenous contrast was administered. COMPARISON:  CT cervical spine 05/21/2021 FINDINGS: Alignment: Angulation at the C2 fracture site is unchanged. Otherwise, normal cervical spine alignment with mild reversal of normal lordosis. Vertebrae: Fracture of C2 (type 2) with mild posterior angulation of the superior segment. There is moderate edema at the fracture site. No paravertebral abnormality. Cord: Normal signal and morphology. Posterior Fossa, vertebral arteries, paraspinal tissues: Negative. Disc levels: C1-2: Unremarkable. C2-3: Small central disc protrusion. There is no spinal canal stenosis. No neural foraminal stenosis. C3-4: Left asymmetric disc bulge with uncinate spurring. Mild spinal canal stenosis. Left facet hypertrophy with severe left neural foraminal stenosis. C4-5: Normal disc space and facet joints. There is no spinal canal stenosis. No neural foraminal stenosis. C5-6: Disc space narrowing with small bulge and uncovertebral hypertrophy. There is no spinal canal stenosis. Severe bilateral neural foraminal stenosis. C6-7: Small left subarticular disc protrusion. There is no spinal canal stenosis. No neural foraminal stenosis. C7-T1: Normal disc space and facet joints. There is no spinal canal stenosis. No neural foraminal stenosis.  IMPRESSION: 1. C2 fracture with moderate edema, but no prevertebral effusion or hematoma. The findings remain somewhat equivocal, but favored to be acute or recent subacute. 2. Severe bilateral C5-6 neural foraminal stenosis. 3. Mild C3-4 spinal canal stenosis and severe left neural foraminal stenosis. Electronically Signed   By: Ulyses Jarred M.D.   On: 05/21/2021 22:28   DG Pelvis Portable  Result Date: 05/21/2021 CLINICAL DATA:  Fall. EXAM: PORTABLE PELVIS 1-2 VIEWS COMPARISON:  None. FINDINGS: There is no acute fracture or dislocation. The bones are osteopenic. Moderate bilateral hip arthritic changes. The soft tissues are grossly unremarkable. IMPRESSION: No acute fracture or dislocation. Electronically Signed   By: Anner Crete M.D.   On: 05/21/2021 03:23   DG Chest Portable 1 View  Result Date: 05/21/2021 CLINICAL DATA:  Fall and hypoxia EXAM: PORTABLE CHEST 1 VIEW COMPARISON:  None. FINDINGS: No focal consolidation, pleural effusion, or  pneumothorax. Mild cardiomegaly. Atherosclerotic calcification of the aorta. Small hiatal hernia. No acute osseous pathology. IMPRESSION: No acute cardiopulmonary process. Electronically Signed   By: Anner Crete M.D.   On: 05/21/2021 03:22      Subjective: Patient was seen and examined at bedside.  Overnight events noted.  Patient reports feeling much improved.   Heart rate has improved.  She remains in cervical collar.  Feels better and want to be discharged home. Home health PT has been arranged.  Discharge Exam: Vitals:   05/25/21 0436 05/25/21 1105  BP: 134/66 (!) 120/56  Pulse: 61   Resp: (!) 21 16  Temp: 97.8 F (36.6 C)   SpO2: 90%    Vitals:   05/24/21 1801 05/24/21 2100 05/25/21 0436 05/25/21 1105  BP:  (!) 156/71 134/66 (!) 120/56  Pulse:   61   Resp:  19 (!) 21 16  Temp:  98.5 F (36.9 C) 97.8 F (36.6 C)   TempSrc: Oral Oral Axillary   SpO2:   90%   Weight:      Height:        General: Pt is alert, awake, not in  acute distress Cardiovascular: RRR, S1/S2 +, no rubs, no gallops Respiratory: CTA bilaterally, no wheezing, no rhonchi Abdominal: Soft, NT, ND, bowel sounds + Extremities: no edema, no cyanosis    The results of significant diagnostics from this hospitalization (including imaging, microbiology, ancillary and laboratory) are listed below for reference.     Microbiology: Recent Results (from the past 240 hour(s))  Resp Panel by RT-PCR (Flu A&B, Covid) Nasopharyngeal Swab     Status: None   Collection Time: 05/21/21  4:51 AM   Specimen: Nasopharyngeal Swab; Nasopharyngeal(NP) swabs in vial transport medium  Result Value Ref Range Status   SARS Coronavirus 2 by RT PCR NEGATIVE NEGATIVE Final    Comment: (NOTE) SARS-CoV-2 target nucleic acids are NOT DETECTED.  The SARS-CoV-2 RNA is generally detectable in upper respiratory specimens during the acute phase of infection. The lowest concentration of SARS-CoV-2 viral copies this assay can detect is 138 copies/mL. A negative result does not preclude SARS-Cov-2 infection and should not be used as the sole basis for treatment or other patient management decisions. A negative result may occur with  improper specimen collection/handling, submission of specimen other than nasopharyngeal swab, presence of viral mutation(s) within the areas targeted by this assay, and inadequate number of viral copies(<138 copies/mL). A negative result must be combined with clinical observations, patient history, and epidemiological information. The expected result is Negative.  Fact Sheet for Patients:  EntrepreneurPulse.com.au  Fact Sheet for Healthcare Providers:  IncredibleEmployment.be  This test is no t yet approved or cleared by the [place] FDA and  has been authorized for detection and/or diagnosis of SARS-CoV-2 by FDA under an Emergency Use Authorization (EUA). This EUA will remain  in effect (meaning this  test can be used) for the duration of the COVID-19 declaration under Section 564(b)(1) of the Act, 21 U.S.C.section 360bbb-3(b)(1), unless the authorization is terminated  or revoked sooner.       Influenza A by PCR NEGATIVE NEGATIVE Final   Influenza B by PCR NEGATIVE NEGATIVE Final    Comment: (NOTE) The Xpert Xpress SARS-CoV-2/FLU/RSV plus assay is intended as an aid in the diagnosis of influenza from Nasopharyngeal swab specimens and should not be used as a sole basis for treatment. Nasal washings and aspirates are unacceptable for Xpert Xpress SARS-CoV-2/FLU/RSV testing.  Fact Sheet for Patients: EntrepreneurPulse.com.au  Fact Sheet for Healthcare Providers: IncredibleEmployment.be  This test is not yet approved or cleared by the [place] FDA and has been authorized for detection and/or diagnosis of SARS-CoV-2 by FDA under an Emergency Use Authorization (EUA). This EUA will remain in effect (meaning this test can be used) for the duration of the COVID-19 declaration under Section 564(b)(1) of the Act, 21 U.S.C. section 360bbb-3(b)(1), unless the authorization is terminated or revoked.  Performed at [facility], [address], [place], [place] [address]      Labs: BNP (last 3 results) No results for input(s): BNP in the last 8760 hours. Basic Metabolic Panel: Recent Labs  Lab 05/21/21 0358 05/22/21 0226 05/23/21 0148 05/24/21 0113 05/25/21 0113  NA 140 134* 136 135 135  K 4.1 3.3* 3.6 3.3* 3.5  CL 98 96* 98 99 100  CO2 33* '30 29 29 28  '$ GLUCOSE 115* 104* 93 107* 106*  BUN 25* '18 21 22 19  '$ CREATININE 1.10* 1.04* 1.25* 1.26* 1.15*  CALCIUM 9.3 8.9 9.0 8.6* 8.8*  MG  --   --  1.7  --   --   PHOS  --   --  3.7  --   --    Liver Function Tests: Recent Labs  Lab 05/21/21 0358 05/23/21 0148  AST 25 19  ALT 12 15  ALKPHOS 105 104  BILITOT 0.8 0.9  PROT 6.6 6.1*  ALBUMIN 3.5 3.0*   No results  for input(s): LIPASE, AMYLASE in the last 168 hours. No results for input(s): AMMONIA in the last 168 hours. CBC: Recent Labs  Lab 05/21/21 0358 05/22/21 0226 05/23/21 0148  WBC 11.7* 8.3 9.9  NEUTROABS 9.7*  --   --   HGB 12.3 11.4* 12.5  HCT 37.2 34.4* 37.4  MCV 94.4 94.8 93.5  PLT 187 182 214   Cardiac Enzymes: No results for input(s): CKTOTAL, CKMB, CKMBINDEX, TROPONINI in the last 168 hours. BNP: Invalid input(s): POCBNP CBG: No results for input(s): GLUCAP in the last 168 hours. D-Dimer No results for input(s): DDIMER in the last 72 hours. Hgb A1c No results for input(s): HGBA1C in the last 72 hours. Lipid Profile No results for input(s): CHOL, HDL, LDLCALC, TRIG, CHOLHDL, LDLDIRECT in the last 72 hours. Thyroid function studies No results for input(s): TSH, T4TOTAL, T3FREE, THYROIDAB in the last 72 hours.  Invalid input(s): FREET3 Anemia work up No results for input(s): VITAMINB12, FOLATE, FERRITIN, TIBC, IRON, RETICCTPCT in the last 72 hours. Urinalysis    Component Value Date/Time   BILIRUBINUR Negative 01/14/2021 1221   PROTEINUR Negative 01/14/2021 1221   UROBILINOGEN 0.2 01/14/2021 1221   NITRITE Negative 01/14/2021 1221   LEUKOCYTESUR Moderate (2+) (A) 01/14/2021 1221   Sepsis Labs Invalid input(s): PROCALCITONIN,  WBC,  LACTICIDVEN Microbiology Recent Results (from the past 240 hour(s))  Resp Panel by RT-PCR (Flu A&B, Covid) Nasopharyngeal Swab     Status: None   Collection Time: 05/21/21  4:51 AM   Specimen: Nasopharyngeal Swab; Nasopharyngeal(NP) swabs in vial transport medium  Result Value Ref Range Status   SARS Coronavirus 2 by RT PCR NEGATIVE NEGATIVE Final    Comment: (NOTE) SARS-CoV-2 target nucleic acids are NOT DETECTED.  The SARS-CoV-2 RNA is generally detectable in upper respiratory specimens during the acute phase of infection. The lowest concentration of SARS-CoV-2 viral copies this assay can detect is 138 copies/mL. A negative  result does not preclude SARS-Cov-2 infection and should not be used as the sole basis for treatment or  other patient management decisions. A negative result may occur with  improper specimen collection/handling, submission of specimen other than nasopharyngeal swab, presence of viral mutation(s) within the areas targeted by this assay, and inadequate number of viral copies(<138 copies/mL). A negative result must be combined with clinical observations, patient history, and epidemiological information. The expected result is Negative.  Fact Sheet for Patients:  EntrepreneurPulse.com.au  Fact Sheet for Healthcare Providers:  IncredibleEmployment.be  This test is no t yet approved or cleared by the [place] FDA and  has been authorized for detection and/or diagnosis of SARS-CoV-2 by FDA under an Emergency Use Authorization (EUA). This EUA will remain  in effect (meaning this test can be used) for the duration of the COVID-19 declaration under Section 564(b)(1) of the Act, 21 U.S.C.section 360bbb-3(b)(1), unless the authorization is terminated  or revoked sooner.       Influenza A by PCR NEGATIVE NEGATIVE Final   Influenza B by PCR NEGATIVE NEGATIVE Final    Comment: (NOTE) The Xpert Xpress SARS-CoV-2/FLU/RSV plus assay is intended as an aid in the diagnosis of influenza from Nasopharyngeal swab specimens and should not be used as a sole basis for treatment. Nasal washings and aspirates are unacceptable for Xpert Xpress SARS-CoV-2/FLU/RSV testing.  Fact Sheet for Patients: EntrepreneurPulse.com.au  Fact Sheet for Healthcare Providers: IncredibleEmployment.be  This test is not yet approved or cleared by the [place] FDA and has been authorized for detection and/or diagnosis of SARS-CoV-2 by FDA under an Emergency Use Authorization (EUA). This EUA will remain in effect (meaning this test can be used)  for the duration of the COVID-19 declaration under Section 564(b)(1) of the Act, 21 U.S.C. section 360bbb-3(b)(1), unless the authorization is terminated or revoked.  Performed at [facility], [address] [place], [place] [address]      Time coordinating discharge: Over 30 minutes  SIGNED:   Shawna Clamp, MD  [facility] 05/25/2021, 2:33 PM Pager   If 7PM-7AM, please contact night-coverage

## 2021-05-25 NOTE — TOC Initial Note (Signed)
Transition of Care [facility]) - Initial/Assessment Note    Patient Details  Name: April Stevenson MRN: [id number] Date of Birth: 01-30-1924  Transition of Care [facility]) CM/SW Contact:    Bethena Roys, RN Phone Number: 05/25/2021, 2:59 PM  Clinical Narrative: Case Manager spoke with son and daughter-n-law regarding transition of care plan for the patient. Patient was previously at the [facility] and the family does not want the patient to return at this point. Physical Therapy recommendations are for home with 24 hour supervision. Family is unsure if they can provide the 24 hour supervision due to other circumstances they are gong through within the home. Case Manager discussed with the family regarding possible Skilled Nursing Facilities (SNF). CSW will fax the patient out to surrounding facilities to see if they have any bed offers. Case Manager reached out to [facility] SNF and they currently don't have bed availability- [facility] has the families number just in case they have a bed become available. Staff RN and MD aware of plan of care. Transition of care team will continue to follow for additional needs.          Expected Discharge Plan: Skilled Nursing Facility Barriers to Discharge: SNF Pending bed offer   Patient Goals and CMS Choice     Choice offered to / list presented to : NA  Expected Discharge Plan and Services Expected Discharge Plan: [place] In-house Referral: Clinical Social Work Discharge Planning Services: CM Consult Post Acute Care Choice: [place] Living arrangements for the past 2 months: [place] Expected Discharge Date: 05/25/21                 DME Agency: NA       HH Arranged: NA          Prior Living Arrangements/Services Living arrangements for the past 2 months: [place] Lives with:: Adult Children Patient language and need for interpreter reviewed:: Yes Do you  feel safe going back to the place where you live?: Yes      Need for Family Participation in Patient Care: Yes (Comment) Care giver support system in place?: Yes (comment)   Criminal Activity/Legal Involvement Pertinent to Current Situation/Hospitalization: No - Comment as needed  Activities of Daily Living Home Assistive Devices/Equipment: Walker (specify type) (front wheel walker) ADL Screening (condition at time of admission) Patient's cognitive ability adequate to safely complete daily activities?: No Is the patient deaf or have difficulty hearing?: No Does the patient have difficulty seeing, even when wearing glasses/contacts?: Yes Does the patient have difficulty concentrating, remembering, or making decisions?: Yes Patient able to express need for assistance with ADLs?: No Does the patient have difficulty dressing or bathing?: Yes Independently performs ADLs?: No Does the patient have difficulty walking or climbing stairs?: Yes Weakness of Legs: Both Weakness of Arms/Hands: Both  Permission Sought/Granted Permission sought to share information with : Family Supports, Customer service manager, Case Optician, dispensing granted to share information with : Yes, Verbal Permission Granted     Permission granted to share info w AGENCY: Faxed out to SNF's        Emotional Assessment Appearance:: Appears stated age     Orientation: : Oriented to Self Alcohol / Substance Use: Not Applicable Psych Involvement: No (comment)  Admission diagnosis:  Syncope and collapse [R55] Syncope [R55] Symptomatic bradycardia [R00.1] Closed odontoid fracture with type II morphology and posterior displacement, initial encounter [facility]) [S12.111A] Patient Active Problem List   Diagnosis Date Noted  Syncope 05/21/2021   Fall 05/21/2021   Closed posterior displaced Type II dens fracture ([place]) 05/21/2021   Bradycardia 05/21/2021   DNR (do not resuscitate) 05/21/2021   CKD (chronic kidney  disease), stage III ([place]) 05/21/2021   Chronic venous insufficiency 09/19/2019   Pulmonary HTN ([place]) 03/15/2019   Dry eyes, bilateral 07/18/2018   Bilateral nonexudative age-related macular degeneration 07/18/2018   Paranoid delusion ([place]) 07/18/2018   Benign essential tremor 07/18/2018   Late onset Alzheimer's disease with behavioral disturbance ([place]) 04/23/2018   Senile osteoporosis 04/23/2018   Hoarding behavior 04/23/2018   Psychophysiological insomnia 04/23/2018   DDD (degenerative disc disease), lumbar 04/23/2018   Major depressive disorder 07/29/2016   Hiatal hernia with GERD 07/29/2011   Essential hypertension 07/29/2011   Hypothyroidism 07/29/2011   LBBB (left bundle branch block) 07/29/2011   Pure hypercholesterolemia 07/29/2011   PCP:  Sandrea Hughs, NP Pharmacy:  No Pharmacies Listed  Readmission Risk Interventions No flowsheet data found.

## 2021-05-25 NOTE — Progress Notes (Signed)
Physical Therapy Treatment Patient Details Name: April Stevenson MRN: [id number] DOB: 1923-11-22 Today's Date: 05/25/2021    History of Present Illness Pt is a 87 year old female who presented 8/5 due to fall at ALF where she was staying for respite care while her son who she lives with was undergoing treatment for Leukemia. Pt found to have mildly displaced type II fracture of the dens and anterior posterior subluxation of C1 on C2. Neurosurgery consulted and pt to wear cervical collar and no surgical intervention.PMH - hypertension, hyperlipidemia, hypothyroidism, pulmonary hypertension, Alzheimer's dementia    PT Comments    Pt making steady progress with mobility. Did well with rollator but will still need assist at home with mobility including use of rollator. Pt will not be able to remember to lock/unlock brakes on rollator.    Follow Up Recommendations  Home health PT;Supervision/Assistance - 24 hour     Equipment Recommendations  Other (comment) (rollator)    Recommendations for Other Services       Precautions / Restrictions Precautions Precautions: Fall;Cervical Required Braces or Orthoses: Cervical Brace Cervical Brace: Hard collar;At all times Restrictions Weight Bearing Restrictions: No    Mobility  Bed Mobility Overal bed mobility: Needs Assistance Bed Mobility: Supine to Sit     Supine to sit: Min assist;HOB elevated     General bed mobility comments: Assist to elevate trunk into sitting    Transfers Overall transfer level: Needs assistance Equipment used: 4-wheeled walker Transfers: Sit to/from Stand Sit to Stand: Min assist         General transfer comment: Assist to bring hips up and for balance. Verbal cues for hand placement  Ambulation/Gait Ambulation/Gait assistance: Min guard Gait Distance (Feet): 120 Feet (120' x 1, 50' x 1, 10' x 1) Assistive device: 4-wheeled walker Gait Pattern/deviations: Step-through pattern;Decreased step  length - right;Decreased step length - left;Shuffle Gait velocity: decr Gait velocity interpretation: <1.31 ft/sec, indicative of household ambulator General Gait Details: Assist for safety. As pt fatigues step length decreases and pt begins shuffling   Stairs             Wheelchair Mobility    Modified Rankin (Stroke Patients Only)       Balance Overall balance assessment: Needs assistance Sitting-balance support: No upper extremity supported Sitting balance-Leahy Scale: Good     Standing balance support: Single extremity supported Standing balance-Leahy Scale: Poor Standing balance comment: Needs UE support for static standing                            Cognition Arousal/Alertness: Awake/alert Behavior During Therapy: Flat affect Overall Cognitive Status: History of cognitive impairments - at baseline                                 General Comments: Pt with Alzheimer's      Exercises      General Comments        Pertinent Vitals/Pain Pain Assessment: Faces Faces Pain Scale: No hurt    Home Living                      Prior Function            PT Goals (current goals can now be found in the care plan section) Progress towards PT goals: Progressing toward goals    Frequency    Min 3X/week  PT Plan Current plan remains appropriate    Co-evaluation              AM-PAC PT "6 Clicks" Mobility   Outcome Measure  Help needed turning from your back to your side while in a flat bed without using bedrails?: A Little Help needed moving from lying on your back to sitting on the side of a flat bed without using bedrails?: A Little Help needed moving to and from a bed to a chair (including a wheelchair)?: A Little Help needed standing up from a chair using your arms (e.g., wheelchair or bedside chair)?: A Little Help needed to walk in hospital room?: A Little Help needed climbing 3-5 steps with a railing?  : A Little 6 Click Score: 18    End of Session Equipment Utilized During Treatment: Gait belt;Cervical collar Activity Tolerance: Patient tolerated treatment well Patient left: in chair;with call bell/phone within reach;with chair alarm set Nurse Communication: Mobility status PT Visit Diagnosis: Unsteadiness on feet (R26.81);Other abnormalities of gait and mobility (R26.89)     Time: [id number] PT Time Calculation (min) (ACUTE ONLY): 22 min  Charges:  $Gait Training: 8-22 mins                     [place] Pager [phone] Office [place] 05/25/2021, 11:33 AM

## 2021-05-25 NOTE — Discharge Instructions (Signed)
Advised to follow-up with primary care physician in 1 week. Advised to follow-up with Dr. Richmond Campbell in 4 to 6 weeks for follow-up. Advised to continue cervical collar  Propranolol dose was discontinued because of bradycardia.

## 2021-05-26 DIAGNOSIS — W19XXXS Unspecified fall, sequela: Secondary | ICD-10-CM

## 2021-05-26 DIAGNOSIS — I10 Essential (primary) hypertension: Secondary | ICD-10-CM | POA: Diagnosis not present

## 2021-05-26 DIAGNOSIS — S12111A Posterior displaced Type II dens fracture, initial encounter for closed fracture: Secondary | ICD-10-CM | POA: Diagnosis not present

## 2021-05-26 NOTE — TOC Progression Note (Signed)
Transition of Care [facility]) - Progression Note    Patient Details  Name: April Stevenson MRN: [id number] Date of Birth: 1924/01/02  Transition of Care [facility]) CM/SW [place], [place] Phone Number: 05/26/2021, 3:05 PM  Clinical Narrative:     CSW spoke with patients son regarding possible SNF placement. CSW discussed medicare and how patient is under observation and currently does not meet criteria to qualify for waiver at this time because patient is walking 120+ feet. CSW discussed dc plan for patient. Patients son does not want patient to return to [facility]. CSW offered patients son resources for other memory cares that he can look into. CSW gave patients son contact for [facility] who can help assist with locating memory care for patient. Patients son interested in patient going home with home health . CSW let patients son know Neoma Laming case manager will follow up with him to assist with patients home health needs. Expected Discharge Plan: Skilled Nursing Facility Barriers to Discharge: SNF Pending bed offer  Expected Discharge Plan and Services Expected Discharge Plan: [place] In-house Referral: Clinical Social Work Discharge Planning Services: CM Consult Post Acute Care Choice: [place] arrangements for the past 2 months: [place] Expected Discharge Date: 05/25/21                 DME Agency: NA       HH Arranged: NA           Social Determinants of Health (SDOH) Interventions    Readmission Risk Interventions No flowsheet data found.

## 2021-05-26 NOTE — TOC Transition Note (Signed)
Transition of Care [facility]) - CM/SW Discharge Note   Patient Details  Name: April Stevenson MRN: [id number] Date of Birth: September 11, 1924  [facility]) CM/SW Contact:  Zenon Mayo, RN Phone Number: 05/26/2021, 4:39 PM   Clinical Narrative:    NCM spoke with son again, offered choice for [facility] agency, he states they had [facility] in the past and they were happy with their services so would like them again.  NCM made referral to Lattie Haw for HHRN,HHPT,HHOT, HHAIDE, and Education officer, museum.  She states she can take referral and the soc will be Friday or Saturday.  NCM informed son of this information. Orders are in.  Son states they have a walker that his wife was using that the patient  can [place]. Plan for dc in am.   Final next level of care: Home w Home Health Services Barriers to Discharge: Family Issues   Patient Goals and CMS Choice   CMS Medicare.gov Compare Post Acute Care list provided to:: Patient Represenative (must comment) Choice offered to / list presented to : Adult Children  Discharge Placement                       Discharge Plan and Services In-house Referral: Clinical Social Work Discharge Planning Services: CM Consult Post Acute Care Choice: [place]            DME Agency: NA       HH Arranged: RN, PT, OT, Nurse's Aide, Social Work [employer] Agency: [facility] Date [place] Agency Contacted: 05/26/21 Time [place]: [id number] Representative spoke with at [place]: [place] ([place]) Interventions     Readmission Risk Interventions No flowsheet data found.

## 2021-05-26 NOTE — Progress Notes (Signed)
PROGRESS NOTE    April Stevenson  [id number] DOB: 1924-08-16 DOA: 05/21/2021 PCP: Sandrea Hughs, NP    Brief Narrative: This 87 years old female with PMH significant for hypertension, hyperlipidemia, hypothyroidism, pulmonary hypertension, Alzheimer's dementia presented after having an unwitnessed fall in the nursing home.  History is obtained from patient's son over the phone and review of records.  Patient had been found on the floor by her bedroom after attempting to go to the bathroom on her own.  She has significant dementia and only oriented to self but she can ambulate without assistance.  She has been living at home with her son for last 31-monthbut had been temporarily moved to [facility] while son is currently undergoing treatment for leukemia.  Patient denies any complaint at this time. CT head and cervical spine was significant for mildly displaced type II fracture of the dens and anterior posterior subluxation of C1 and C2.  Neurosurgery consulted recommended no surgical intervention.  Assessment & Plan:   Principal Problem:   Fall Active Problems:   Essential hypertension   Hypothyroidism   Late onset Alzheimer's disease with behavioral disturbance ([place]   Syncope   Closed posterior displaced Type II dens fracture ([facility])   Bradycardia   DNR (do not resuscitate)   CKD (chronic kidney disease), stage III ([place]   Syncope s/p fall: Patient presented after having a unwitnessed fall at the nursing facility.   At baseline patient does not usually use a walker or assistive aid to get around.   Unclear if patient had a mechanical fall or had a syncopal episode as patient was noted to be bradycardic, but blood pressures appear to be maintained. Continue telemetry, check orthostatic hypotension Hold home medication which reduces to heart rate.  Dens type II fracture:   Patient was noted to have a dens type II fracture of the cervical spine that was  seen on initial imaging of unclear age.   Continue cervical collar MRI C-spine:  C2 fracture with moderate edema, but no prevertebral effusion or hematoma. The findings remain somewhat equivocal, but favored to be acute or recent subacute. Neurosurgery consulted, no surgical intervention indicated at this time.  She is not a candidate for C1-C2 fusion if her fracture worsens.  Continue wearing c-collar at all times.   Leukocytosis: > Resolved. Chest x-ray unremarkable.   Patient remained afebrile it could be reactive.   Recheck labs shows normal WBC.  Sinus bradycardia:  On admission patient's heart rates were noted to be anywhere from 37-58, but blood pressures are currently maintained.   Home blood pressure medications include propranolol 60 mg daily, irbesartan 300 mg daily, and furosemide 40 mg daily. Hold propranolol due to bradycardia Continue telemetry and give atropine if needed for signs of hemodynamic instability. Heart rate now improved.   Dementia, with behavioral disturbance:  Patient has significant dementia and is only oriented to self.  While in the hospital patient was noted to be getting up out of bed and is at risk of falls. Continue delirium precaution Continue sitter to bedside Hold Aricept as it could possibly worsen bradycardia   Essential hypertension: Blood pressures were noted to be elevated up to 183/81.  Home blood pressure medications include furosemide 40 mg daily, irbesartan 300 mg daily, and propranolol 60 mg daily. Continue Irbesartan Hold furosemide due to possible dehydration with elevated BUN to creatinine ratio Hold propranolol due to bradycardia Continue Hydralazine IV as needed for elevated systolic blood pressure greater  than 180.   Hypothyroidism Continue levothyroxine   Chronic kidney disease stage IIIa:  Serum creatinine at baseline. Continue to monitor   GERD Continue pantoprazole.     DVT prophylaxis: Lovenox. Code Status:  DNR Family Communication: No family at bed side. Disposition Plan:   Status is: Observation  The patient remains OBS appropriate and will d/c before 2 midnights.  Dispo: The patient is from: Home              Anticipated d/c is to: SNF              Patient currently is medically stable to d/c.  Awaiting SNF bed availability   Difficult to place patient No    Consultants:  Neurosurgery  Procedures: Antimicrobials:  Anti-infectives (From admission, onward)    None     Subjective:  She denies any complaints, no significant events as discussed with staff. Objective: Vitals:   05/25/21 1105 05/25/21 1449 05/25/21 2200 05/26/21 0555  BP: (!) 120/56 (!) 147/78 133/82 (!) 162/76  Pulse:      Resp: '16  15 12  '$ Temp:    97.9 F (36.6 C)  TempSrc:    Oral  SpO2:   93% 93%  Weight:      Height:        Intake/Output Summary (Last 24 hours) at 05/26/2021 1234 Last data filed at 05/26/2021 1033 Gross per 24 hour  Intake 290 ml  Output 900 ml  Net -610 ml   Filed Weights   05/21/21 0236  Weight: 64.1 kg    Examination:  Awake Alert, peers comfortable, in no apparent distress, pleasant, wearing c-collar. Symmetrical Chest wall movement, Good air movement bilaterally, CTAB RRR,No Gallops,Rubs or new Murmurs, No Parasternal Heave +ve B.Sounds, Abd Soft, No tenderness, No rebound - guarding or rigidity. No Cyanosis, Clubbing or edema, No new Rash or bruise       Data Reviewed: I have personally reviewed following labs and imaging studies  CBC: Recent Labs  Lab 05/21/21 0358 05/22/21 0226 05/23/21 0148  WBC 11.7* 8.3 9.9  NEUTROABS 9.7*  --   --   HGB 12.3 11.4* 12.5  HCT 37.2 34.4* 37.4  MCV 94.4 94.8 93.5  PLT 187 182 [id number]   Basic Metabolic Panel: Recent Labs  Lab 05/21/21 0358 05/22/21 0226 05/23/21 0148 05/24/21 0113 05/25/21 0113  NA 140 134* 136 135 135  K 4.1 3.3* 3.6 3.3* 3.5  CL 98 96* 98 99 100  CO2 33* '30 29 29 28  '$ GLUCOSE 115* 104* 93  107* 106*  BUN 25* '18 21 22 19  '$ CREATININE 1.10* 1.04* 1.25* 1.26* 1.15*  CALCIUM 9.3 8.9 9.0 8.6* 8.8*  MG  --   --  1.7  --   --   PHOS  --   --  3.7  --   --    GFR: Estimated Creatinine Clearance: 29.6 mL/min (A) (by C-G formula based on SCr of 1.15 mg/dL (H)). Liver Function Tests: Recent Labs  Lab 05/21/21 0358 05/23/21 0148  AST 25 19  ALT 12 15  ALKPHOS 105 104  BILITOT 0.8 0.9  PROT 6.6 6.1*  ALBUMIN 3.5 3.0*   No results for input(s): LIPASE, AMYLASE in the last 168 hours. No results for input(s): AMMONIA in the last 168 hours. Coagulation Profile: No results for input(s): INR, PROTIME in the last 168 hours. Cardiac Enzymes: No results for input(s): CKTOTAL, CKMB, CKMBINDEX, TROPONINI in the last 168 hours. BNP (last 3 results)  No results for input(s): PROBNP in the last 8760 hours. HbA1C: No results for input(s): HGBA1C in the last 72 hours. CBG: No results for input(s): GLUCAP in the last 168 hours. Lipid Profile: No results for input(s): CHOL, HDL, LDLCALC, TRIG, CHOLHDL, LDLDIRECT in the last 72 hours. Thyroid Function Tests: No results for input(s): TSH, T4TOTAL, FREET4, T3FREE, THYROIDAB in the last 72 hours.  Anemia Panel: No results for input(s): VITAMINB12, FOLATE, FERRITIN, TIBC, IRON, RETICCTPCT in the last 72 hours. Sepsis Labs: No results for input(s): PROCALCITON, LATICACIDVEN in the last 168 hours.  Recent Results (from the past 240 hour(s))  Resp Panel by RT-PCR (Flu A&B, Covid) Nasopharyngeal Swab     Status: None   Collection Time: 05/21/21  4:51 AM   Specimen: Nasopharyngeal Swab; Nasopharyngeal(NP) swabs in vial transport medium  Result Value Ref Range Status   SARS Coronavirus 2 by RT PCR NEGATIVE NEGATIVE Final    Comment: (NOTE) SARS-CoV-2 target nucleic acids are NOT DETECTED.  The SARS-CoV-2 RNA is generally detectable in upper respiratory specimens during the acute phase of infection. The lowest concentration of SARS-CoV-2  viral copies this assay can detect is 138 copies/mL. A negative result does not preclude SARS-Cov-2 infection and should not be used as the sole basis for treatment or other patient management decisions. A negative result may occur with  improper specimen collection/handling, submission of specimen other than nasopharyngeal swab, presence of viral mutation(s) within the areas targeted by this assay, and inadequate number of viral copies(<138 copies/mL). A negative result must be combined with clinical observations, patient history, and epidemiological information. The expected result is Negative.  Fact Sheet for Patients:  EntrepreneurPulse.com.au  Fact Sheet for Healthcare Providers:  IncredibleEmployment.be  This test is no t yet approved or cleared by the [place] FDA and  has been authorized for detection and/or diagnosis of SARS-CoV-2 by FDA under an Emergency Use Authorization (EUA). This EUA will remain  in effect (meaning this test can be used) for the duration of the COVID-19 declaration under Section 564(b)(1) of the Act, 21 U.S.C.section 360bbb-3(b)(1), unless the authorization is terminated  or revoked sooner.       Influenza A by PCR NEGATIVE NEGATIVE Final   Influenza B by PCR NEGATIVE NEGATIVE Final    Comment: (NOTE) The Xpert Xpress SARS-CoV-2/FLU/RSV plus assay is intended as an aid in the diagnosis of influenza from Nasopharyngeal swab specimens and should not be used as a sole basis for treatment. Nasal washings and aspirates are unacceptable for Xpert Xpress SARS-CoV-2/FLU/RSV testing.  Fact Sheet for Patients: EntrepreneurPulse.com.au  Fact Sheet for Healthcare Providers: IncredibleEmployment.be  This test is not yet approved or cleared by the [place] FDA and has been authorized for detection and/or diagnosis of SARS-CoV-2 by FDA under an Emergency Use Authorization  (EUA). This EUA will remain in effect (meaning this test can be used) for the duration of the COVID-19 declaration under Section 564(b)(1) of the Act, 21 U.S.C. section 360bbb-3(b)(1), unless the authorization is terminated or revoked.  Performed at [facility], [address] [place], [place] [address]      Radiology Studies: No results found.   Scheduled Meds:  enoxaparin (LOVENOX) injection  30 mg Subcutaneous Q24H   FLUoxetine  20 mg Oral Daily   hydrALAZINE  25 mg Oral Q8H   irbesartan  300 mg Oral Daily   levothyroxine  25 mcg Oral Q0600   melatonin  10 mg Oral QHS   pantoprazole  40 mg Oral  Daily   sodium chloride flush  3 mL Intravenous Q12H   traZODone  50 mg Oral QHS   Continuous Infusions:   LOS: 0 days      Phillips Climes, MD [facility]   If 7PM-7AM, please contact night-coverage

## 2021-05-26 NOTE — Plan of Care (Signed)

## 2021-05-26 NOTE — TOC Progression Note (Signed)
Transition of Care [facility]) - Progression Note    Patient Details  Name: April Stevenson MRN: [id number] Date of Birth: 05/09/1924  Transition of Care [facility] CM/SW Contact  Zenon Mayo, RN Phone Number: 05/26/2021, 2:20 PM  Clinical Narrative:    NCM spoke with son, he states there is no way patient can come home, they do not have some one who can be with here and he is getting cancer treatments, patient has a neck fx, failing kidneys per son. He states he thought she may be able to get a waiver.  NCM informed him will check with CSW.  CSW will look into.   Expected Discharge Plan: Skilled Nursing Facility Barriers to Discharge: SNF Pending bed offer  Expected Discharge Plan and Services Expected Discharge Plan: [place] In-house Referral: Clinical Social Work Discharge Planning Services: CM Consult Post Acute Care Choice: [place] arrangements for the past 2 months: [place] Expected Discharge Date: 05/25/21                 DME Agency: NA       HH Arranged: NA           Social Determinants of Health (SDOH) Interventions    Readmission Risk Interventions No flowsheet data found.

## 2021-05-27 ENCOUNTER — Telehealth: Payer: Self-pay

## 2021-05-27 DIAGNOSIS — S12111A Posterior displaced Type II dens fracture, initial encounter for closed fracture: Secondary | ICD-10-CM | POA: Diagnosis not present

## 2021-05-27 MED ORDER — HYDRALAZINE HCL 25 MG PO TABS: 25.0000 mg | ORAL_TABLET | Freq: Three times a day (TID) | ORAL | 1 refills | Status: DC

## 2021-05-27 NOTE — Care Management (Signed)
Home Health (Order [id number]) Nursing Date: 05/26/2021 Department: [facility] Progressive Care Ordering/Authorizing: Elgergawy, Silver Huguenin, MD     Elgergawy, Silver Huguenin, MD NPI: [id number]    Patient Information  Patient Name  April Stevenson, April Stevenson Legal Sex  Female DOB  1924-04-27 SSN  [id number]   Order Information  Order Date/Time Release Date/Time Start Date/Time End Date/Time  05/26/21 04:02 PM None 05/26/21 04:02 PM Until Specified   Order History Inpatient Date/Time Action Taken User Additional Information  05/26/21 1602 Sign Elgergawy, Silver Huguenin, MD Modify from Order: [id number]  05/26/21 1602 Release Instance Elgergawy, Silver Huguenin, MD (auto-released) Released Order: [id number]  05/26/21 [id number] Acknowledge Wilder Glade, RN New Order   Order Questions  Question Answer  To provide the following care/treatments PT   OT   [place]   Social work

## 2021-05-27 NOTE — Telephone Encounter (Signed)
Son called with a concern due the fact that when [facility] is assigned in patient instructions it states [facility] vs [facility]. He had been calling [facility] with n o record of his mother. I called Lattie Haw from [employer] ( liaison) who had all his information and was processing it, She will give him a call this afternoon.

## 2021-05-27 NOTE — Discharge Summary (Signed)
Physician Discharge Summary  Stevenson April [id number] DOB: [birth year] DOA: 05/21/2021  PCP: Sandrea Hughs, NP  Admit date: 05/21/2021  Discharge date: 05/27/2021  No significant updates on discharge summary done by Dr. Dwyane Dee on 8/9, discharged home today, on health services has been increased to PT/OT/RN/aide and Education officer, museum.  Admitted From: Home.  Disposition:  Home Health services . Home PT  Recommendations for Outpatient Follow-up:  Follow up with PCP in 1-2 weeks. Please obtain BMP/CBC in one week. Advised to follow-up with Dr. Franchot Mimes in 4 to 6 weeks for follow-up. 4.   Advised to wear cervical collar at all times. Propranolol is discontinued because of bradycardia.  Home Health:Home PT Equipment/Devices:None  Discharge Condition: Good CODE STATUS: DNR Diet recommendation: Heart Healthy  Brief Summary / Hospital Course: This 87 years old female with PMH significant for hypertension, hyperlipidemia, hypothyroidism, pulmonary hypertension, Alzheimer's dementia presented after having an unwitnessed fall in the nursing home.  History is obtained from patient's son over the phone and review of records.  Patient had been found on the floor by her bed after attempting to go to the bathroom on her own.  She has significant dementia and only oriented to self but she can ambulate without assistance.  She had been living at home with her son for last 17-monthbut had been temporarily moved to [facility] while son is currently undergoing treatment for leukemia.  Patient denies any complaint at this time. Patient was admitted s/p fall. CT head and cervical spine was significant for mildly displaced type II fracture of the dens and anterior posterior subluxation of C1 and C2.  Neurosurgery was consulted recommended no surgical intervention.  Patient is not a candidate for surgical intervention,  recommended [place] collar at all times,  adequate pain control.  Patient  was found to have bradycardia on admission.  Propranolol was discontinued.  Heart rate has improved.  Patient feels better , reports pain is controlled.   PT has recommended home health services home PT.  Patient feels better and wants to be discharged.  She was managed for below problems.   Discharge Diagnoses:  Principal Problem:   Fall Active Problems:   Essential hypertension   Hypothyroidism   Late onset Alzheimer's disease with behavioral disturbance ([facility]   Syncope   Closed posterior displaced Type II dens fracture (HCC)   Bradycardia   DNR (do not resuscitate)   CKD (chronic kidney disease), stage III ([place]  Syncope s/p fall: Patient presented after having a unwitnessed fall at the nursing facility.   At baseline patient does not usually use a walker or assistive aid to get around.   Unclear if patient had a mechanical fall or had a syncopal episode as patient was noted to be bradycardic, but blood pressures appear to be maintained. Continue telemetry, check orthostatic hypotension Hold home medication which reduces to heart rate.   Dens type II fracture:   Patient was noted to have a dens type II fracture of the cervical spine that was seen on initial imaging of unclear age.   Continue cervical collar MRI C-spine:  C2 fracture with moderate edema, but no prevertebral effusion or hematoma. The findings remain somewhat equivocal, but favored to be acute or recent subacute. Neurosurgery consulted, no surgical intervention indicated at this time.  She is not a candidate for C1-C2 fusion if her fracture worsens.  Continue wearing c-collar at all times.   Leukocytosis: > Resolved. Chest x-ray unremarkable.   Patient remained  afebrile it could be reactive.   Recheck labs shows normal WBC.   Sinus bradycardia:  Improved. On admission patient's heart rates were noted to be anywhere from 37-58, but blood pressures are currently maintained.   Home blood pressure medications  include propranolol 60 mg daily, irbesartan 300 mg daily, and furosemide 40 mg daily. Hold propranolol due to bradycardia Continue telemetry and give atropine if needed for signs of hemodynamic instability. Heart rate now improved.   Dementia, with behavioral disturbance:  Patient has significant dementia and is only oriented to self.   Continue delirium precaution Continue sitter to bedside Hold Aricept as it could possibly worsen bradycardia   Essential hypertension: Blood pressures were noted to be elevated up to 183/81.   Home blood pressure medications include furosemide 40 mg daily, irbesartan 300 mg daily, and propranolol 60 mg daily. Continue Irbesartan Hold furosemide due to possible dehydration with elevated BUN to creatinine ratio Hold propranolol due to bradycardia Continue Hydralazine IV as needed for elevated systolic blood pressure greater than 180.   Hypothyroidism Continue levothyroxine   Chronic kidney disease stage IIIa:  Serum creatinine at baseline. Continue to monitor   GERD Continue pantoprazole.    Discharge Instructions  Discharge Instructions     Call MD for:  difficulty breathing, headache or visual disturbances   Complete by: As directed    Call MD for:  persistant dizziness or light-headedness   Complete by: As directed    Call MD for:  persistant nausea and vomiting   Complete by: As directed    Call MD for:  severe uncontrolled pain   Complete by: As directed    Diet - low sodium heart healthy   Complete by: As directed    Diet Carb Modified   Complete by: As directed    Increase activity slowly   Complete by: As directed    No wound care   Complete by: As directed       Allergies as of 05/27/2021       Reactions   Garlic    Lac Bovis    Lanolin    Penicillins    ? rash   Statins    myalgia        Medication List     STOP taking these medications    propranolol ER 60 MG 24 hr capsule Commonly known as: INDERAL LA        TAKE these medications    Calcium Carbonate-Vitamin D 600-400 MG-UNIT chew tablet Chew 1 tablet by mouth 3 (three) times daily with meals.   Cholecalciferol 50 MCG (2000 UT) Caps Commonly known as: D3 High Potency Take 1 capsule (2,000 Units total) by mouth daily.   diclofenac Sodium 1 % Gel Commonly known as: Voltaren Apply 4 g topically 4 (four) times daily as needed.   donepezil 10 MG tablet Commonly known as: ARICEPT TAKE 1 TABLET(10 MG) BY MOUTH AT BEDTIME   FLUoxetine 20 MG tablet Commonly known as: PROZAC TAKE 1 TABLET(20 MG) BY MOUTH DAILY   furosemide 40 MG tablet Commonly known as: LASIX TAKE 1 TABLET(40 MG) BY MOUTH DAILY   hydrALAZINE 25 MG tablet Commonly known as: APRESOLINE Take 1 tablet (25 mg total) by mouth every 8 (eight) hours.   irbesartan 300 MG tablet Commonly known as: AVAPRO TAKE 1 TABLET(300 MG) BY MOUTH DAILY   levothyroxine 25 MCG tablet Commonly known as: SYNTHROID TAKE 1 TABLET(25 MCG) BY MOUTH AT BEDTIME   Melatonin 10 MG Tabs Take  1 tablet by mouth at bedtime.   methocarbamol 500 MG tablet Commonly known as: Robaxin Take 0.5 tablets (250 mg total) by mouth every 8 (eight) hours as needed for muscle spasms.   multivitamin with minerals Tabs tablet Take 1 tablet by mouth in the morning and at bedtime.   omeprazole 20 MG capsule Commonly known as: PRILOSEC TAKE 1 CAPSULE(20 MG) BY MOUTH DAILY   potassium chloride SA 20 MEQ tablet Commonly known as: KLOR-CON TAKE 1 TABLET BY MOUTH EVERY DAY   traZODone 50 MG tablet Commonly known as: DESYREL TAKE 1 TABLET(50 MG) BY MOUTH AT BEDTIME   vitamin C 500 MG tablet Commonly known as: ASCORBIC ACID Take 1 tablet (500 mg total) by mouth daily.        Follow-up Information     Dawley, Troy C, DO. Schedule an appointment as soon as possible for a visit in 6 week(s).   Contact information: [address] [place] [address] [phone]          Ngetich, Dinah C, NP Follow up in 1 week(s).   Specialty: Family Medicine Contact information: [place] [address] [phone]         Jerline Pain, MD .   Specialty: Cardiology Contact information: [phone] [address] [place] [address] [phone]         Golda Acre, [facility] Follow up.   Specialty: Home Health Services Why: Prudencio Pair, Social Worker Contact information: [address] [place] [address] [phone]                Allergies  Allergen Reactions   Garlic    Lac Bovis    Lanolin    Penicillins     ? rash   Statins     myalgia    Consultations: Neurosurgery   Procedures/Studies: CT HEAD WO CONTRAST (5MM)  Result Date: 05/21/2021 CLINICAL DATA:  Fall EXAM: CT HEAD WITHOUT CONTRAST CT CERVICAL SPINE WITHOUT CONTRAST TECHNIQUE: Multidetector CT imaging of the head and cervical spine was performed following the standard protocol without intravenous contrast. Multiplanar CT image reconstructions of the cervical spine were also generated. COMPARISON:  None. FINDINGS: CT HEAD FINDINGS Brain: There is no mass, hemorrhage or extra-axial collection. There is generalized atrophy without lobar predilection. There is hypoattenuation of the periventricular white matter, most commonly indicating chronic ischemic microangiopathy. Vascular: No abnormal hyperdensity of the major intracranial arteries or dural venous sinuses. No intracranial atherosclerosis. Skull: The visualized skull base, calvarium and extracranial soft tissues are normal. Sinuses/Orbits: No fluid levels or advanced mucosal thickening of the visualized paranasal sinuses. No mastoid or middle ear effusion. The orbits are normal. CT CERVICAL SPINE FINDINGS Alignment: Mild posterior subluxation C1 on C2. Skull base and vertebrae: Mildly displaced type 2 fracture of the dens, of uncertain age. Soft tissues and spinal canal: No  prevertebral fluid or swelling. No visible canal hematoma. Disc levels: Moderate upper cervical left facet arthrosis. Upper chest: No pneumothorax, pulmonary nodule or pleural effusion. Other: Normal visualized paraspinal cervical soft tissues. IMPRESSION: 1. Chronic ischemic microangiopathy and generalized atrophy without acute intracranial abnormality. 2. Mildly displaced type 2 fracture of the dens with mild posterior subluxation C1 on C2. Acuity is uncertain. MRI recommended for further characterization. Critical Value/emergent results were called by telephone at the time of interpretation on 05/21/2021 at 3:32 am to provider [facility] , who verbally acknowledged these results. Electronically Signed  By: Ulyses Jarred M.D.   On: 05/21/2021 03:37   CT Cervical Spine Wo Contrast  Result Date: 05/21/2021 CLINICAL DATA:  Fall EXAM: CT HEAD WITHOUT CONTRAST CT CERVICAL SPINE WITHOUT CONTRAST TECHNIQUE: Multidetector CT imaging of the head and cervical spine was performed following the standard protocol without intravenous contrast. Multiplanar CT image reconstructions of the cervical spine were also generated. COMPARISON:  None. FINDINGS: CT HEAD FINDINGS Brain: There is no mass, hemorrhage or extra-axial collection. There is generalized atrophy without lobar predilection. There is hypoattenuation of the periventricular white matter, most commonly indicating chronic ischemic microangiopathy. Vascular: No abnormal hyperdensity of the major intracranial arteries or dural venous sinuses. No intracranial atherosclerosis. Skull: The visualized skull base, calvarium and extracranial soft tissues are normal. Sinuses/Orbits: No fluid levels or advanced mucosal thickening of the visualized paranasal sinuses. No mastoid or middle ear effusion. The orbits are normal. CT CERVICAL SPINE FINDINGS Alignment: Mild posterior subluxation C1 on C2. Skull base and vertebrae: Mildly displaced type 2 fracture of the dens, of  uncertain age. Soft tissues and spinal canal: No prevertebral fluid or swelling. No visible canal hematoma. Disc levels: Moderate upper cervical left facet arthrosis. Upper chest: No pneumothorax, pulmonary nodule or pleural effusion. Other: Normal visualized paraspinal cervical soft tissues. IMPRESSION: 1. Chronic ischemic microangiopathy and generalized atrophy without acute intracranial abnormality. 2. Mildly displaced type 2 fracture of the dens with mild posterior subluxation C1 on C2. Acuity is uncertain. MRI recommended for further characterization. Critical Value/emergent results were called by telephone at the time of interpretation on 05/21/2021 at 3:32 am to provider [facility] , who verbally acknowledged these results. Electronically Signed   By: Ulyses Jarred M.D.   On: 05/21/2021 03:37   MR Cervical Spine Wo Contrast  Result Date: 05/21/2021 CLINICAL DATA:  Age-indeterminate spinal fracture EXAM: MRI CERVICAL SPINE WITHOUT CONTRAST TECHNIQUE: Multiplanar, multisequence MR imaging of the cervical spine was performed. No intravenous contrast was administered. COMPARISON:  CT cervical spine 05/21/2021 FINDINGS: Alignment: Angulation at the C2 fracture site is unchanged. Otherwise, normal cervical spine alignment with mild reversal of normal lordosis. Vertebrae: Fracture of C2 (type 2) with mild posterior angulation of the superior segment. There is moderate edema at the fracture site. No paravertebral abnormality. Cord: Normal signal and morphology. Posterior Fossa, vertebral arteries, paraspinal tissues: Negative. Disc levels: C1-2: Unremarkable. C2-3: Small central disc protrusion. There is no spinal canal stenosis. No neural foraminal stenosis. C3-4: Left asymmetric disc bulge with uncinate spurring. Mild spinal canal stenosis. Left facet hypertrophy with severe left neural foraminal stenosis. C4-5: Normal disc space and facet joints. There is no spinal canal stenosis. No neural foraminal  stenosis. C5-6: Disc space narrowing with small bulge and uncovertebral hypertrophy. There is no spinal canal stenosis. Severe bilateral neural foraminal stenosis. C6-7: Small left subarticular disc protrusion. There is no spinal canal stenosis. No neural foraminal stenosis. C7-T1: Normal disc space and facet joints. There is no spinal canal stenosis. No neural foraminal stenosis. IMPRESSION: 1. C2 fracture with moderate edema, but no prevertebral effusion or hematoma. The findings remain somewhat equivocal, but favored to be acute or recent subacute. 2. Severe bilateral C5-6 neural foraminal stenosis. 3. Mild C3-4 spinal canal stenosis and severe left neural foraminal stenosis. Electronically Signed   By: Ulyses Jarred M.D.   On: 05/21/2021 22:28   DG Pelvis Portable  Result Date: 05/21/2021 CLINICAL DATA:  Fall. EXAM: PORTABLE PELVIS 1-2 VIEWS COMPARISON:  None. FINDINGS: There is no acute fracture or dislocation. The bones  are osteopenic. Moderate bilateral hip arthritic changes. The soft tissues are grossly unremarkable. IMPRESSION: No acute fracture or dislocation. Electronically Signed   By: Anner Crete M.D.   On: 05/21/2021 03:23   DG Chest Portable 1 View  Result Date: 05/21/2021 CLINICAL DATA:  Fall and hypoxia EXAM: PORTABLE CHEST 1 VIEW COMPARISON:  None. FINDINGS: No focal consolidation, pleural effusion, or pneumothorax. Mild cardiomegaly. Atherosclerotic calcification of the aorta. Small hiatal hernia. No acute osseous pathology. IMPRESSION: No acute cardiopulmonary process. Electronically Signed   By: Anner Crete M.D.   On: 05/21/2021 03:22      Subjective: Patient was seen and examined at bedside.  Overnight events noted.  Patient reports feeling much improved.   Heart rate has improved.  She remains in cervical collar.  Feels better and want to be discharged home. Home health PT has been arranged.  Discharge Exam: Vitals:   05/27/21 0532 05/27/21 0735  BP:  (!) 148/67   Pulse:  84  Resp:  20  Temp: 99.7 F (37.6 C) 98.8 F (37.1 C)  SpO2:     Vitals:   05/26/21 2004 05/27/21 0429 05/27/21 0532 05/27/21 0735  BP: (!) 158/66 (!) 169/76  (!) 148/67  Pulse: 79 96  84  Resp: '20 20  20  '$ Temp: 98.9 F (37.2 C) 100.1 F (37.8 C) 99.7 F (37.6 C) 98.8 F (37.1 C)  TempSrc: Oral Oral Oral Oral  SpO2: 94% 94%    Weight:      Height:        General: Pt is alert, awake, not in acute distress Cardiovascular: RRR, S1/S2 +, no rubs, no gallops Respiratory: CTA bilaterally, no wheezing, no rhonchi Abdominal: Soft, NT, ND, bowel sounds + Extremities: no edema, no cyanosis    The results of significant diagnostics from this hospitalization (including imaging, microbiology, ancillary and laboratory) are listed below for reference.     Microbiology: Recent Results (from the past 240 hour(s))  Resp Panel by RT-PCR (Flu A&B, Covid) Nasopharyngeal Swab     Status: None   Collection Time: 05/21/21  4:51 AM   Specimen: Nasopharyngeal Swab; Nasopharyngeal(NP) swabs in vial transport medium  Result Value Ref Range Status   SARS Coronavirus 2 by RT PCR NEGATIVE NEGATIVE Final    Comment: (NOTE) SARS-CoV-2 target nucleic acids are NOT DETECTED.  The SARS-CoV-2 RNA is generally detectable in upper respiratory specimens during the acute phase of infection. The lowest concentration of SARS-CoV-2 viral copies this assay can detect is 138 copies/mL. A negative result does not preclude SARS-Cov-2 infection and should not be used as the sole basis for treatment or other patient management decisions. A negative result may occur with  improper specimen collection/handling, submission of specimen other than nasopharyngeal swab, presence of viral mutation(s) within the areas targeted by this assay, and inadequate number of viral copies(<138 copies/mL). A negative result must be combined with clinical observations, patient history, and epidemiological information.  The expected result is Negative.  Fact Sheet for Patients:  EntrepreneurPulse.com.au  Fact Sheet for Healthcare Providers:  IncredibleEmployment.be  This test is no t yet approved or cleared by the [place] FDA and  has been authorized for detection and/or diagnosis of SARS-CoV-2 by FDA under an Emergency Use Authorization (EUA). This EUA will remain  in effect (meaning this test can be used) for the duration of the COVID-19 declaration under Section 564(b)(1) of the Act, 21 U.S.C.section 360bbb-3(b)(1), unless the authorization is terminated  or revoked sooner.  Influenza A by PCR NEGATIVE NEGATIVE Final   Influenza B by PCR NEGATIVE NEGATIVE Final    Comment: (NOTE) The Xpert Xpress SARS-CoV-2/FLU/RSV plus assay is intended as an aid in the diagnosis of influenza from Nasopharyngeal swab specimens and should not be used as a sole basis for treatment. Nasal washings and aspirates are unacceptable for Xpert Xpress SARS-CoV-2/FLU/RSV testing.  Fact Sheet for Patients: EntrepreneurPulse.com.au  Fact Sheet for Healthcare Providers: IncredibleEmployment.be  This test is not yet approved or cleared by the [place] FDA and has been authorized for detection and/or diagnosis of SARS-CoV-2 by FDA under an Emergency Use Authorization (EUA). This EUA will remain in effect (meaning this test can be used) for the duration of the COVID-19 declaration under Section 564(b)(1) of the Act, 21 U.S.C. section 360bbb-3(b)(1), unless the authorization is terminated or revoked.  Performed at [facility], [address], [place], [place] [address]      Labs: BNP (last 3 results) No results for input(s): BNP in the last 8760 hours. Basic Metabolic Panel: Recent Labs  Lab 05/21/21 0358 05/22/21 0226 05/23/21 0148 05/24/21 0113 05/25/21 0113  NA 140 134* 136 135 135  K 4.1 3.3* 3.6  3.3* 3.5  CL 98 96* 98 99 100  CO2 33* '30 29 29 28  '$ GLUCOSE 115* 104* 93 107* 106*  BUN 25* '18 21 22 19  '$ CREATININE 1.10* 1.04* 1.25* 1.26* 1.15*  CALCIUM 9.3 8.9 9.0 8.6* 8.8*  MG  --   --  1.7  --   --   PHOS  --   --  3.7  --   --    Liver Function Tests: Recent Labs  Lab 05/21/21 0358 05/23/21 0148  AST 25 19  ALT 12 15  ALKPHOS 105 104  BILITOT 0.8 0.9  PROT 6.6 6.1*  ALBUMIN 3.5 3.0*   No results for input(s): LIPASE, AMYLASE in the last 168 hours. No results for input(s): AMMONIA in the last 168 hours. CBC: Recent Labs  Lab 05/21/21 0358 05/22/21 0226 05/23/21 0148  WBC 11.7* 8.3 9.9  NEUTROABS 9.7*  --   --   HGB 12.3 11.4* 12.5  HCT 37.2 34.4* 37.4  MCV 94.4 94.8 93.5  PLT 187 182 214   Cardiac Enzymes: No results for input(s): CKTOTAL, CKMB, CKMBINDEX, TROPONINI in the last 168 hours. BNP: Invalid input(s): POCBNP CBG: No results for input(s): GLUCAP in the last 168 hours. D-Dimer No results for input(s): DDIMER in the last 72 hours. Hgb A1c No results for input(s): HGBA1C in the last 72 hours. Lipid Profile No results for input(s): CHOL, HDL, LDLCALC, TRIG, CHOLHDL, LDLDIRECT in the last 72 hours. Thyroid function studies No results for input(s): TSH, T4TOTAL, T3FREE, THYROIDAB in the last 72 hours.  Invalid input(s): FREET3 Anemia work up No results for input(s): VITAMINB12, FOLATE, FERRITIN, TIBC, IRON, RETICCTPCT in the last 72 hours. Urinalysis    Component Value Date/Time   BILIRUBINUR Negative 01/14/2021 1221   PROTEINUR Negative 01/14/2021 1221   UROBILINOGEN 0.2 01/14/2021 1221   NITRITE Negative 01/14/2021 1221   LEUKOCYTESUR Moderate (2+) (A) 01/14/2021 1221   Sepsis Labs Invalid input(s): PROCALCITONIN,  WBC,  LACTICIDVEN Microbiology Recent Results (from the past 240 hour(s))  Resp Panel by RT-PCR (Flu A&B, Covid) Nasopharyngeal Swab     Status: None   Collection Time: 05/21/21  4:51 AM   Specimen: Nasopharyngeal Swab;  Nasopharyngeal(NP) swabs in vial transport medium  Result Value Ref Range Status   SARS Coronavirus  2 by RT PCR NEGATIVE NEGATIVE Final    Comment: (NOTE) SARS-CoV-2 target nucleic acids are NOT DETECTED.  The SARS-CoV-2 RNA is generally detectable in upper respiratory specimens during the acute phase of infection. The lowest concentration of SARS-CoV-2 viral copies this assay can detect is 138 copies/mL. A negative result does not preclude SARS-Cov-2 infection and should not be used as the sole basis for treatment or other patient management decisions. A negative result may occur with  improper specimen collection/handling, submission of specimen other than nasopharyngeal swab, presence of viral mutation(s) within the areas targeted by this assay, and inadequate number of viral copies(<138 copies/mL). A negative result must be combined with clinical observations, patient history, and epidemiological information. The expected result is Negative.  Fact Sheet for Patients:  EntrepreneurPulse.com.au  Fact Sheet for Healthcare Providers:  IncredibleEmployment.be  This test is no t yet approved or cleared by the [place] FDA and  has been authorized for detection and/or diagnosis of SARS-CoV-2 by FDA under an Emergency Use Authorization (EUA). This EUA will remain  in effect (meaning this test can be used) for the duration of the COVID-19 declaration under Section 564(b)(1) of the Act, 21 U.S.C.section 360bbb-3(b)(1), unless the authorization is terminated  or revoked sooner.       Influenza A by PCR NEGATIVE NEGATIVE Final   Influenza B by PCR NEGATIVE NEGATIVE Final    Comment: (NOTE) The Xpert Xpress SARS-CoV-2/FLU/RSV plus assay is intended as an aid in the diagnosis of influenza from Nasopharyngeal swab specimens and should not be used as a sole basis for treatment. Nasal washings and aspirates are unacceptable for Xpert Xpress  SARS-CoV-2/FLU/RSV testing.  Fact Sheet for Patients: EntrepreneurPulse.com.au  Fact Sheet for Healthcare Providers: IncredibleEmployment.be  This test is not yet approved or cleared by the [place] FDA and has been authorized for detection and/or diagnosis of SARS-CoV-2 by FDA under an Emergency Use Authorization (EUA). This EUA will remain in effect (meaning this test can be used) for the duration of the COVID-19 declaration under Section 564(b)(1) of the Act, 21 U.S.C. section 360bbb-3(b)(1), unless the authorization is terminated or revoked.  Performed at [facility], [address] [place], [place] [address]       SIGNED:   Phillips Climes, MD  [facility] 05/27/2021, 10:00 AM Pager   If 7PM-7AM, please contact night-coverage

## 2021-05-28 DIAGNOSIS — M48061 Spinal stenosis, lumbar region without neurogenic claudication: Secondary | ICD-10-CM | POA: Diagnosis not present

## 2021-05-28 DIAGNOSIS — S61412D Laceration without foreign body of left hand, subsequent encounter: Secondary | ICD-10-CM | POA: Diagnosis not present

## 2021-05-28 DIAGNOSIS — M810 Age-related osteoporosis without current pathological fracture: Secondary | ICD-10-CM | POA: Diagnosis not present

## 2021-05-28 DIAGNOSIS — F0281 Dementia in other diseases classified elsewhere with behavioral disturbance: Secondary | ICD-10-CM | POA: Diagnosis not present

## 2021-05-28 DIAGNOSIS — Z87891 Personal history of nicotine dependence: Secondary | ICD-10-CM | POA: Diagnosis not present

## 2021-05-28 DIAGNOSIS — I872 Venous insufficiency (chronic) (peripheral): Secondary | ICD-10-CM | POA: Diagnosis not present

## 2021-05-28 DIAGNOSIS — H353130 Nonexudative age-related macular degeneration, bilateral, stage unspecified: Secondary | ICD-10-CM | POA: Diagnosis not present

## 2021-05-28 DIAGNOSIS — E039 Hypothyroidism, unspecified: Secondary | ICD-10-CM | POA: Diagnosis not present

## 2021-05-28 DIAGNOSIS — Z9181 History of falling: Secondary | ICD-10-CM | POA: Diagnosis not present

## 2021-05-28 DIAGNOSIS — G301 Alzheimer's disease with late onset: Secondary | ICD-10-CM | POA: Diagnosis not present

## 2021-05-28 DIAGNOSIS — I272 Pulmonary hypertension, unspecified: Secondary | ICD-10-CM | POA: Diagnosis not present

## 2021-05-28 DIAGNOSIS — K219 Gastro-esophageal reflux disease without esophagitis: Secondary | ICD-10-CM | POA: Diagnosis not present

## 2021-05-28 DIAGNOSIS — F5104 Psychophysiologic insomnia: Secondary | ICD-10-CM | POA: Diagnosis not present

## 2021-05-28 DIAGNOSIS — F329 Major depressive disorder, single episode, unspecified: Secondary | ICD-10-CM | POA: Diagnosis not present

## 2021-05-28 DIAGNOSIS — N1831 Chronic kidney disease, stage 3a: Secondary | ICD-10-CM | POA: Diagnosis not present

## 2021-05-28 DIAGNOSIS — E7800 Pure hypercholesterolemia, unspecified: Secondary | ICD-10-CM | POA: Diagnosis not present

## 2021-05-28 DIAGNOSIS — I129 Hypertensive chronic kidney disease with stage 1 through stage 4 chronic kidney disease, or unspecified chronic kidney disease: Secondary | ICD-10-CM | POA: Diagnosis not present

## 2021-05-28 DIAGNOSIS — S12111D Posterior displaced Type II dens fracture, subsequent encounter for fracture with routine healing: Secondary | ICD-10-CM | POA: Diagnosis not present

## 2021-05-31 DIAGNOSIS — S61412D Laceration without foreign body of left hand, subsequent encounter: Secondary | ICD-10-CM | POA: Diagnosis not present

## 2021-05-31 DIAGNOSIS — N1831 Chronic kidney disease, stage 3a: Secondary | ICD-10-CM | POA: Diagnosis not present

## 2021-05-31 DIAGNOSIS — S12111D Posterior displaced Type II dens fracture, subsequent encounter for fracture with routine healing: Secondary | ICD-10-CM | POA: Diagnosis not present

## 2021-05-31 DIAGNOSIS — G301 Alzheimer's disease with late onset: Secondary | ICD-10-CM | POA: Diagnosis not present

## 2021-05-31 DIAGNOSIS — I129 Hypertensive chronic kidney disease with stage 1 through stage 4 chronic kidney disease, or unspecified chronic kidney disease: Secondary | ICD-10-CM | POA: Diagnosis not present

## 2021-05-31 DIAGNOSIS — F0281 Dementia in other diseases classified elsewhere with behavioral disturbance: Secondary | ICD-10-CM | POA: Diagnosis not present

## 2021-06-01 DIAGNOSIS — F0281 Dementia in other diseases classified elsewhere with behavioral disturbance: Secondary | ICD-10-CM | POA: Diagnosis not present

## 2021-06-01 DIAGNOSIS — I129 Hypertensive chronic kidney disease with stage 1 through stage 4 chronic kidney disease, or unspecified chronic kidney disease: Secondary | ICD-10-CM | POA: Diagnosis not present

## 2021-06-01 DIAGNOSIS — S12111D Posterior displaced Type II dens fracture, subsequent encounter for fracture with routine healing: Secondary | ICD-10-CM | POA: Diagnosis not present

## 2021-06-01 DIAGNOSIS — G301 Alzheimer's disease with late onset: Secondary | ICD-10-CM | POA: Diagnosis not present

## 2021-06-01 DIAGNOSIS — S61412D Laceration without foreign body of left hand, subsequent encounter: Secondary | ICD-10-CM | POA: Diagnosis not present

## 2021-06-01 DIAGNOSIS — N1831 Chronic kidney disease, stage 3a: Secondary | ICD-10-CM | POA: Diagnosis not present

## 2021-06-02 ENCOUNTER — Ambulatory Visit: Payer: Medicare Other | Admitting: Podiatry

## 2021-06-03 ENCOUNTER — Telehealth: Payer: Self-pay | Admitting: *Deleted

## 2021-06-03 DIAGNOSIS — S61412D Laceration without foreign body of left hand, subsequent encounter: Secondary | ICD-10-CM | POA: Diagnosis not present

## 2021-06-03 DIAGNOSIS — G301 Alzheimer's disease with late onset: Secondary | ICD-10-CM | POA: Diagnosis not present

## 2021-06-03 DIAGNOSIS — S12111D Posterior displaced Type II dens fracture, subsequent encounter for fracture with routine healing: Secondary | ICD-10-CM | POA: Diagnosis not present

## 2021-06-03 DIAGNOSIS — F0281 Dementia in other diseases classified elsewhere with behavioral disturbance: Secondary | ICD-10-CM | POA: Diagnosis not present

## 2021-06-03 DIAGNOSIS — N1831 Chronic kidney disease, stage 3a: Secondary | ICD-10-CM | POA: Diagnosis not present

## 2021-06-03 DIAGNOSIS — I129 Hypertensive chronic kidney disease with stage 1 through stage 4 chronic kidney disease, or unspecified chronic kidney disease: Secondary | ICD-10-CM | POA: Diagnosis not present

## 2021-06-03 NOTE — Telephone Encounter (Signed)
Will need follow up visit to evaluate for any injuries or bruises.

## 2021-06-03 NOTE — Telephone Encounter (Signed)
Patient son, Lyndal Rainbow "[name]" HCPOA called and stated that patient has been in [place] at [facility].   Stated that patient fell 8/4 or 8/5 and they called him the morning of 8/5 at 1:20am.   Stated that he has spoken with Stana Bunting, Manager at [facility] and requested the records of the fall and they will NOT release them.   They told him that they would need a Dr. Montine Circle to release the Incident report.    I called [place] [phone] and spoke with Stana Bunting. I explained the situation and he laughed and stated that a form has to be filled out at their facility and then approved by their legal dept before the incident can be released. He stated that he was waiting for the approval and then it could be released to him. Laughed again and stated that it was in the works and hung up.   I notified son of response.

## 2021-06-03 NOTE — Telephone Encounter (Signed)
Patient has tested Positive for Covid and has a Radiographer, therapeutic Visit scheduled for tomorrow with you.   Patient was evaluated in the ER on 05/21/2021 and released back home with son.

## 2021-06-04 ENCOUNTER — Telehealth (INDEPENDENT_AMBULATORY_CARE_PROVIDER_SITE_OTHER): Payer: Medicare Other | Admitting: Family

## 2021-06-04 ENCOUNTER — Other Ambulatory Visit: Payer: Self-pay

## 2021-06-04 ENCOUNTER — Encounter: Payer: Self-pay | Admitting: Family

## 2021-06-04 VITALS — BP 148/72 | HR 84 | Temp 98.8°F | Resp 20

## 2021-06-04 DIAGNOSIS — U071 COVID-19: Secondary | ICD-10-CM | POA: Diagnosis not present

## 2021-06-04 DIAGNOSIS — S12110D Anterior displaced Type II dens fracture, subsequent encounter for fracture with routine healing: Secondary | ICD-10-CM

## 2021-06-04 DIAGNOSIS — J069 Acute upper respiratory infection, unspecified: Secondary | ICD-10-CM

## 2021-06-04 MED ORDER — NIRMATRELVIR/RITONAVIR (PAXLOVID) TABLET (RENAL DOSING): 1.0000 | ORAL_TABLET | Freq: Two times a day (BID) | ORAL | 0 refills | Status: AC

## 2021-06-04 MED ORDER — ZINC GLUCONATE 50 MG PO TABS: 50.0000 mg | ORAL_TABLET | Freq: Every day | ORAL | 0 refills | Status: AC

## 2021-06-04 NOTE — Progress Notes (Signed)
This service is provided via telemedicine  No vital signs collected/recorded due to the encounter was a telemedicine visit.   Location of patient (ex: home, work):  Home  Patient consents to a telephone visit:  Yes refer to telehealth visit from 11/11/2020  Location of the provider (ex: office, home):  [facility] and Adult medicine  Name of any referring provider:  n/a  Names of all persons participating in the telemedicine service and their role in the encounter:  Marlowe Sax, NP, Lesly Rubenstein Zepeda/CMA, patient, and patients son Su Monks.  Time spent on call:  7 minutes spent with medical assistant.     Provider: Marlowe Sax FNP-C  Unique Searfoss, Nelda Bucks, NP  Patient Care Team: Quenisha Lovins, Nelda Bucks, NP as PCP - General (Family Medicine) Jerline Pain, MD as PCP - Cardiology (Cardiology)  Extended Emergency Contact Information Primary Emergency Contact: Su Monks Address: [address]          [place], [place] [address] Johnnette Litter of [place] Work Phone: [phone] Mobile Phone: [phone] Relation: Son Secondary Emergency Contact: Flonnie Overman Address: [address]          [place], [place] [address] Johnnette Litter of [place] Mobile Phone: [phone] Relation: Relative  Code Status:  DNR Goals of care: Advanced Directive information Advanced Directives 05/21/2021  Does Patient Have a Medical Advance Directive? No  Type of Advance Directive -  Does patient want to make changes to medical advance directive? -  Copy of [place] in Chart? -  Would patient like information on creating a medical advance directive? No - Patient declined  Pre-existing out of facility DNR order (yellow form or pink MOST form) -     Chief Complaint  Patient presents with   Acute Visit    Patient complains of coughing, sneezing, runny nose, and low appetite. Patient tested positive for covid yesterday 06/04/2021.    HPI:  Pt is a 87 y.o. female seen today  for an acute visit for evaluation of cough,sneezing ,runny nose and poor oral intake.she is seen with the assistance of the son who provides additional HPI information.Patient's son tested positive for COVID-19 recently and patient presented with symptoms and tested positive yesterday. She denies any fever,chills,N/V/D,loss of taste or smell.  She is status post Hospital admission from 05/21/2021 - 05/27/2021 for syncope after she had unwitnessed fall in the Nursing home after attempting to go to the bathroom on her own.  CT head and cervical spine was significant for mildly displaced type 2 fracture of the dens and anterior subluxation of C1 and C2 .Neurosurgery was consulted and no surgical intervention was recommended.No a candidate for surgery.[place] collar was recommended at all times and pain control. Had bradycardia on admission and propranolol was discontinued and heart rate improved.Physical therapy recommended HH PT  She stabilized and was discharge home with home health PT/OT/RN and Aide.  States feeling much better since discharged home. Appetite has improved though son doesn't think so.    Past Medical History:  Diagnosis Date   Benign essential tremor 07/18/2018   Bilateral nonexudative age-related macular degeneration 07/18/2018   Cholestatic hepatitis    2001   DDD (degenerative disc disease), lumbar 04/23/2018   Depression    Dry eyes, bilateral 07/18/2018   Fatigue    GERD (gastroesophageal reflux disease)    Hiatal hernia with GERD 07/29/2011   Hoarding behavior 04/23/2018   HTN (hypertension) 07/29/2011   Hyperlipidemia    Hypertension    Hypothyroidism  07/29/2011   Insomnia    Late onset Alzheimer's disease with behavioral disturbance ([place]) 04/23/2018   LBBB (left bundle branch block) 07/29/2011   Major depressive disorder 07/29/2016   Memory impairment 12/09/2016   Memory loss    Osteoporosis    Paranoid delusion ([place]) 07/18/2018   Psychophysiological insomnia 04/23/2018    Pulmonary HTN ([place]) 03/15/2019   2 d echo 02/28/19 EF 55-60 %, mild to moderate mitral valve regurg, mild tricuspid regurg, severely elevated estimated right ventricular systolic pressure 85 mm Hg, and mild to moderate aortic valve regurg. No diastolic CHF noted.    Pure hypercholesterolemia 07/29/2011   Sciatica    Senile osteoporosis 04/23/2018   Past Surgical History:  Procedure Laterality Date   APPENDECTOMY     CARDIAC CATHETERIZATION     FACIAL COSMETIC SURGERY     TONSILLECTOMY     TOTAL ABDOMINAL HYSTERECTOMY W/ BILATERAL SALPINGOOPHORECTOMY      Allergies  Allergen Reactions   Garlic    Lac Bovis    Lanolin    Penicillins     ? rash   Statins     myalgia    Outpatient Encounter Medications as of 06/04/2021  Medication Sig   Calcium Carbonate-Vitamin D 600-400 MG-UNIT chew tablet Chew 1 tablet by mouth 3 (three) times daily with meals.   Cholecalciferol (D3 HIGH POTENCY) 2000 units CAPS Take 1 capsule (2,000 Units total) by mouth daily.   diclofenac Sodium (VOLTAREN) 1 % GEL Apply 4 g topically 4 (four) times daily as needed.   donepezil (ARICEPT) 10 MG tablet TAKE 1 TABLET(10 MG) BY MOUTH AT BEDTIME   FLUoxetine (PROZAC) 20 MG tablet TAKE 1 TABLET(20 MG) BY MOUTH DAILY   furosemide (LASIX) 40 MG tablet TAKE 1 TABLET(40 MG) BY MOUTH DAILY   hydrALAZINE (APRESOLINE) 25 MG tablet Take 1 tablet (25 mg total) by mouth every 8 (eight) hours.   irbesartan (AVAPRO) 300 MG tablet TAKE 1 TABLET(300 MG) BY MOUTH DAILY   levothyroxine (SYNTHROID) 25 MCG tablet TAKE 1 TABLET(25 MCG) BY MOUTH AT BEDTIME   Melatonin 10 MG TABS Take 1 tablet by mouth at bedtime.   methocarbamol (ROBAXIN) 500 MG tablet Take 0.5 tablets (250 mg total) by mouth every 8 (eight) hours as needed for muscle spasms.   Multiple Vitamin (MULTIVITAMIN WITH MINERALS) TABS tablet Take 1 tablet by mouth in the morning and at bedtime.   omeprazole (PRILOSEC) 20 MG capsule TAKE 1 CAPSULE(20 MG) BY MOUTH DAILY    potassium chloride SA (KLOR-CON) 20 MEQ tablet TAKE 1 TABLET BY MOUTH EVERY DAY   traZODone (DESYREL) 50 MG tablet TAKE 1 TABLET(50 MG) BY MOUTH AT BEDTIME   vitamin C (ASCORBIC ACID) 500 MG tablet Take 1 tablet (500 mg total) by mouth daily.   No facility-administered encounter medications on file as of 06/04/2021.    Review of Systems  Constitutional:  Positive for appetite change. Negative for chills, fatigue, fever and unexpected weight change.  HENT:  Positive for rhinorrhea and sneezing. Negative for congestion, dental problem, ear discharge, ear pain, facial swelling, hearing loss, nosebleeds, postnasal drip, sinus pressure, sinus pain, sore throat, tinnitus and trouble swallowing.   Eyes:  Negative for pain, discharge, redness, itching and visual disturbance.  Respiratory:  Positive for cough. Negative for chest tightness, shortness of breath and wheezing.   Cardiovascular:  Negative for chest pain, palpitations and leg swelling.  Gastrointestinal:  Negative for abdominal distention, abdominal pain, blood in stool, constipation, diarrhea, nausea and vomiting.  Genitourinary:  Negative for difficulty urinating, dysuria, flank pain, frequency and urgency.  Musculoskeletal:  Negative for arthralgias, back pain, gait problem, joint swelling, myalgias, neck pain and neck stiffness.  Skin:  Negative for color change, pallor, rash and wound.  Neurological:  Negative for dizziness, syncope, speech difficulty, weakness, light-headedness, numbness and headaches.  Hematological:  Does not bruise/bleed easily.  Psychiatric/Behavioral:  Negative for agitation, behavioral problems, confusion, hallucinations, self-injury, sleep disturbance and suicidal ideas. The patient is not nervous/anxious.    Immunization History  Administered Date(s) Administered   Influenza Split 08/14/2009, 06/29/2010, 07/19/2011, 07/29/2016   Influenza, High Dose Seasonal PF 08/08/2012, 07/26/2013, 09/17/2014, 07/31/2015,  08/15/2019   Influenza, Quadrivalent, Recombinant, Inj, Pf 07/19/2017   Influenza,inj,Quad PF,6+ Mos 08/16/2018   Influenza,trivalent, recombinat, inj, PF 07/26/2016   Moderna Sars-Covid-2 Vaccination 10/22/2019, 11/19/2019   Pneumococcal Conjugate-13 08/21/2009   Pneumococcal Polysaccharide-23 04/20/2018   Td 10/13/1997   Tdap 08/31/2006   Zoster Recombinat (Shingrix) 05/25/2018, 08/19/2018   Zoster, Live 06/15/2011   Pertinent  Health Maintenance Due  Topic Date Due   INFLUENZA VACCINE  05/17/2021   DEXA SCAN  Completed   PNA vac Low Risk Adult  Completed   Fall Risk  04/29/2021 01/14/2021 11/06/2020 08/11/2020 08/03/2020  Falls in the past year? '1 1 1 1 1  '$ Number falls in past yr: 0 '1 1 1 1  '$ Comment - - - - 5 falls since april 1 last week  Injury with Fall? 0 1 0 1 1  Comment - - - - bruising on the arm  Risk for fall due to : No Fall Risks - - - -  Follow up Falls evaluation completed - - - -   Functional Status Survey:    Vitals:   05/28/21 1258  BP: (!) 148/72  Pulse: 84  Resp: 20  Temp: 98.8 F (37.1 C)   There is no height or weight on file to calculate BMI. Physical Exam Constitutional:      General: She is not in acute distress.    Appearance: She is not ill-appearing.  Pulmonary:     Effort: Pulmonary effort is normal. No respiratory distress.  Neurological:     Mental Status: She is alert. Mental status is at baseline.  Psychiatric:        Mood and Affect: Mood normal.    Labs reviewed: Recent Labs    05/23/21 0148 05/24/21 0113 05/25/21 0113  NA 136 135 135  K 3.6 3.3* 3.5  CL 98 99 100  CO2 '29 29 28  '$ GLUCOSE 93 107* 106*  BUN '21 22 19  '$ CREATININE 1.25* 1.26* 1.15*  CALCIUM 9.0 8.6* 8.8*  MG 1.7  --   --   PHOS 3.7  --   --    Recent Labs    08/03/20 1623 05/21/21 0358 05/23/21 0148  AST '21 25 19  '$ ALT '22 12 15  '$ ALKPHOS  --  105 104  BILITOT 0.4 0.8 0.9  PROT 6.2 6.6 6.1*  ALBUMIN  --  3.5 3.0*   Recent Labs    08/03/20 1623  01/14/21 1228 05/21/21 0358 05/22/21 0226 05/23/21 0148  WBC 7.2 9.8 11.7* 8.3 9.9  NEUTROABS 5,328 7,585 9.7*  --   --   HGB 12.5 13.3 12.3 11.4* 12.5  HCT 37.1 39.9 37.2 34.4* 37.4  MCV 94.2 94.8 94.4 94.8 93.5  PLT 174 169 187 182 214   Lab Results  Component Value Date   TSH 2.389 05/21/2021  No results found for: HGBA1C Lab Results  Component Value Date   CHOL 244 (A) 04/19/2018   HDL 54 04/19/2018   LDLCALC 168 04/19/2018   TRIG 110 04/19/2018    Significant Diagnostic Results in last 30 days:  CT HEAD WO CONTRAST (5MM)  Result Date: 05/21/2021 CLINICAL DATA:  Fall EXAM: CT HEAD WITHOUT CONTRAST CT CERVICAL SPINE WITHOUT CONTRAST TECHNIQUE: Multidetector CT imaging of the head and cervical spine was performed following the standard protocol without intravenous contrast. Multiplanar CT image reconstructions of the cervical spine were also generated. COMPARISON:  None. FINDINGS: CT HEAD FINDINGS Brain: There is no mass, hemorrhage or extra-axial collection. There is generalized atrophy without lobar predilection. There is hypoattenuation of the periventricular white matter, most commonly indicating chronic ischemic microangiopathy. Vascular: No abnormal hyperdensity of the major intracranial arteries or dural venous sinuses. No intracranial atherosclerosis. Skull: The visualized skull base, calvarium and extracranial soft tissues are normal. Sinuses/Orbits: No fluid levels or advanced mucosal thickening of the visualized paranasal sinuses. No mastoid or middle ear effusion. The orbits are normal. CT CERVICAL SPINE FINDINGS Alignment: Mild posterior subluxation C1 on C2. Skull base and vertebrae: Mildly displaced type 2 fracture of the dens, of uncertain age. Soft tissues and spinal canal: No prevertebral fluid or swelling. No visible canal hematoma. Disc levels: Moderate upper cervical left facet arthrosis. Upper chest: No pneumothorax, pulmonary nodule or pleural effusion. Other:  Normal visualized paraspinal cervical soft tissues. IMPRESSION: 1. Chronic ischemic microangiopathy and generalized atrophy without acute intracranial abnormality. 2. Mildly displaced type 2 fracture of the dens with mild posterior subluxation C1 on C2. Acuity is uncertain. MRI recommended for further characterization. Critical Value/emergent results were called by telephone at the time of interpretation on 05/21/2021 at 3:32 am to provider [facility] , who verbally acknowledged these results. Electronically Signed   By: Ulyses Jarred M.D.   On: 05/21/2021 03:37   CT Cervical Spine Wo Contrast  Result Date: 05/21/2021 CLINICAL DATA:  Fall EXAM: CT HEAD WITHOUT CONTRAST CT CERVICAL SPINE WITHOUT CONTRAST TECHNIQUE: Multidetector CT imaging of the head and cervical spine was performed following the standard protocol without intravenous contrast. Multiplanar CT image reconstructions of the cervical spine were also generated. COMPARISON:  None. FINDINGS: CT HEAD FINDINGS Brain: There is no mass, hemorrhage or extra-axial collection. There is generalized atrophy without lobar predilection. There is hypoattenuation of the periventricular white matter, most commonly indicating chronic ischemic microangiopathy. Vascular: No abnormal hyperdensity of the major intracranial arteries or dural venous sinuses. No intracranial atherosclerosis. Skull: The visualized skull base, calvarium and extracranial soft tissues are normal. Sinuses/Orbits: No fluid levels or advanced mucosal thickening of the visualized paranasal sinuses. No mastoid or middle ear effusion. The orbits are normal. CT CERVICAL SPINE FINDINGS Alignment: Mild posterior subluxation C1 on C2. Skull base and vertebrae: Mildly displaced type 2 fracture of the dens, of uncertain age. Soft tissues and spinal canal: No prevertebral fluid or swelling. No visible canal hematoma. Disc levels: Moderate upper cervical left facet arthrosis. Upper chest: No pneumothorax,  pulmonary nodule or pleural effusion. Other: Normal visualized paraspinal cervical soft tissues. IMPRESSION: 1. Chronic ischemic microangiopathy and generalized atrophy without acute intracranial abnormality. 2. Mildly displaced type 2 fracture of the dens with mild posterior subluxation C1 on C2. Acuity is uncertain. MRI recommended for further characterization. Critical Value/emergent results were called by telephone at the time of interpretation on 05/21/2021 at 3:32 am to provider [facility] , who verbally acknowledged these results. Electronically Signed  By: Ulyses Jarred M.D.   On: 05/21/2021 03:37   MR Cervical Spine Wo Contrast  Result Date: 05/21/2021 CLINICAL DATA:  Age-indeterminate spinal fracture EXAM: MRI CERVICAL SPINE WITHOUT CONTRAST TECHNIQUE: Multiplanar, multisequence MR imaging of the cervical spine was performed. No intravenous contrast was administered. COMPARISON:  CT cervical spine 05/21/2021 FINDINGS: Alignment: Angulation at the C2 fracture site is unchanged. Otherwise, normal cervical spine alignment with mild reversal of normal lordosis. Vertebrae: Fracture of C2 (type 2) with mild posterior angulation of the superior segment. There is moderate edema at the fracture site. No paravertebral abnormality. Cord: Normal signal and morphology. Posterior Fossa, vertebral arteries, paraspinal tissues: Negative. Disc levels: C1-2: Unremarkable. C2-3: Small central disc protrusion. There is no spinal canal stenosis. No neural foraminal stenosis. C3-4: Left asymmetric disc bulge with uncinate spurring. Mild spinal canal stenosis. Left facet hypertrophy with severe left neural foraminal stenosis. C4-5: Normal disc space and facet joints. There is no spinal canal stenosis. No neural foraminal stenosis. C5-6: Disc space narrowing with small bulge and uncovertebral hypertrophy. There is no spinal canal stenosis. Severe bilateral neural foraminal stenosis. C6-7: Small left subarticular disc  protrusion. There is no spinal canal stenosis. No neural foraminal stenosis. C7-T1: Normal disc space and facet joints. There is no spinal canal stenosis. No neural foraminal stenosis. IMPRESSION: 1. C2 fracture with moderate edema, but no prevertebral effusion or hematoma. The findings remain somewhat equivocal, but favored to be acute or recent subacute. 2. Severe bilateral C5-6 neural foraminal stenosis. 3. Mild C3-4 spinal canal stenosis and severe left neural foraminal stenosis. Electronically Signed   By: Ulyses Jarred M.D.   On: 05/21/2021 22:28   DG Pelvis Portable  Result Date: 05/21/2021 CLINICAL DATA:  Fall. EXAM: PORTABLE PELVIS 1-2 VIEWS COMPARISON:  None. FINDINGS: There is no acute fracture or dislocation. The bones are osteopenic. Moderate bilateral hip arthritic changes. The soft tissues are grossly unremarkable. IMPRESSION: No acute fracture or dislocation. Electronically Signed   By: Anner Crete M.D.   On: 05/21/2021 03:23   DG Chest Portable 1 View  Result Date: 05/21/2021 CLINICAL DATA:  Fall and hypoxia EXAM: PORTABLE CHEST 1 VIEW COMPARISON:  None. FINDINGS: No focal consolidation, pleural effusion, or pneumothorax. Mild cardiomegaly. Atherosclerotic calcification of the aorta. Small hiatal hernia. No acute osseous pathology. IMPRESSION: No acute cardiopulmonary process. Electronically Signed   By: Anner Crete M.D.   On: 05/21/2021 03:22    Assessment/Plan 1. COVID-19 virus RNA test result positive at limit of detection Tested positive 06/03/2021 contact with son who tested positive. - continue on vit D and C  - add Zinc as below Discussed Paxlovid use and SE  - Additional Self Quarantine information provided on AVS .Instructed to continue with self isolation x 5 days per CDC guideline. .dncovid - nirmatrelvir/ritonavir EUA, renal dosing, (PAXLOVID) 10 x 150 MG & 10 x '100MG'$  TABS; Take 1 tablet by mouth 2 (two) times daily for 5 days. (Take nirmatrelvir 150 mg one  tablet twice daily for 5 days and ritonavir 100 mg one tablet twice daily for 5 days) Patient GFR is 44  Dispense: 10 tablet; Refill: 0 - zinc gluconate 50 MG tablet; Take 1 tablet (50 mg total) by mouth daily for 14 days.  Dispense: 14 tablet; Refill: 0  2. Upper respiratory tract infection due to COVID-19 virus - Supportive care as above - Notify provider or go to ED if symptoms worsen  - nirmatrelvir/ritonavir EUA, renal dosing, (PAXLOVID) 10 x 150 MG & 10  x '100MG'$  TABS; Take 1 tablet by mouth 2 (two) times daily for 5 days. (Take nirmatrelvir 150 mg one tablet twice daily for 5 days and ritonavir 100 mg one tablet twice daily for 5 days) Patient GFR is 44  Dispense: 10 tablet; Refill: 0 - zinc gluconate 50 MG tablet; Take 1 tablet (50 mg total) by mouth daily for 14 days.  Dispense: 14 tablet; Refill: 0  3. Closed odontoid fracture with type II morphology, anterior displacement, and routine healing, subsequent encounter - continue to wear Neck collar at all times as directed - follow up with specialist as scheduled - continue PT/OT/HHRN and Aide for assistance with ADL's   Family/ staff Communication: Reviewed plan of care with patient and son verbalized understanding   Labs/tests ordered: None   Next Appointment: As needed if symptoms worsen or fail to improve    I connected with  [facility] on 06/04/21 by a video enabled telemedicine application and verified that I am speaking with the correct person using two identifiers.   I discussed the limitations of evaluation and management by telemedicine. The patient expressed understanding and agreed to proceed.   Spent 25 minutes of face to face with patient    Sandrea Hughs, NP

## 2021-06-04 NOTE — Patient Instructions (Signed)
- Take Zinc 50 mg tablet one by mouth daily for 14 days  Continue on vit D and Vitamin C - Tylenol as needed for fever or body aches  - increase your fruits intake in your diet  - increase your water intake to 6-8 glasses of water daily  - Notify provider or go to ED if you develop any chest tightness,chest pain or shortness of breath   COVID-19: What to Do if You Are Sick CDC has updated isolation and quarantine recommendations for the public, and is revising the CDC website to reflect these changes. These recommendations do not apply to healthcare personnel and do not supersede state, local, tribal, or territorial laws, rules, andregulations. If you have a fever, cough or other symptoms, you might have COVID-19. Most people have mild illness and are able to recover at home. If you are sick: Keep track of your symptoms. If you have an emergency warning sign (including trouble breathing), call 911. Steps to help prevent the spread of COVID-19 if you are sick If you are sick with COVID-19 or think you might have COVID-19, follow the steps below to care for yourself and to help protect other peoplein your home and community. Stay home except to get medical care Stay home. Most people with COVID-19 have mild illness and can recover at home without medical care. Do not leave your home, except to get medical care. Do not visit public areas. Take care of yourself. Get rest and stay hydrated. Take over-the-counter medicines, such as acetaminophen, to help you feel better. Stay in touch with your doctor. Call before you get medical care. Be sure to get care if you have trouble breathing, or have any other emergency warning signs, or if you think it is an emergency. Avoid public transportation, ride-sharing, or taxis. Separate yourself from other people As much as possible, stay in a specific room and away from other people and pets in your home. If possible, you should use a separate bathroom. If you  need to be around other people or animals in oroutside of the home, wear a mask. Tell your close contactsthat they may have been exposed to COVID-19. An infected person can spread COVID-19 starting 48 hours (or 2 days) before the person has any symptoms or tests positive. By letting your close contacts know they may have been exposed to COVID-19, you are helping to protect everyone. Additional guidance is available for those living in close quarters and shared housing. See COVID-19 and Animals if you have questions about pets. If you are diagnosed with COVID-19, someone from the health department may call you. Answer the call to slow the spread. Monitor your symptoms Symptoms of COVID-19 include fever, cough, or other symptoms. Follow care instructions from your healthcare provider and local health department. Your local health authorities may give instructions on checking your symptoms and reporting information. When to seek emergency medical attention Look for emergency warning signs* for COVID-19. If someone is showing any of these signs, seek emergency medical care immediately: Trouble breathing Persistent pain or pressure in the chest New confusion Inability to wake or stay awake Pale, gray, or blue-colored skin, lips, or nail beds, depending on skin tone *This list is not all possible symptoms. Please call your medical provider forany other symptoms that are severe or concerning to you. Call 911 or call ahead to your local emergency facility: Notify the operator that you are seeking care for someone who has or may haveCOVID-19. Call ahead before visiting  your doctor Call ahead. Many medical visits for routine care are being postponed or done by phone or telemedicine. If you have a medical appointment that cannot be postponed, call your doctor's office, and tell them you have or may have COVID-19. This will help the office protect themselves and other patients. Get tested If you have  symptoms of COVID-19, get tested. While waiting for test results, you stay away from others, including staying apart from those living in your household. Self-tests are one of several options for testing for the virus that causes COVID-19 and may be more convenient than laboratory-based tests and point-of-care tests. Ask your healthcare provider or your local health department if you need help interpreting your test results. You can visit your state, tribal, local, and territorial health department's website to look for the latest local information on testing sites. If you are sick, wear a mask over your nose and mouth You should wear a mask over your nose and mouth if you must be around other people or animals, including pets (even at home). You don't need to wear the mask if you are alone. If you can't put on a mask (because of trouble breathing, for example), cover your coughs and sneezes in some other way. Try to stay at least 6 feet away from other people. This will help protect the people around you. Masks should not be placed on young children under age 49 years, anyone who has trouble breathing, or anyone who is not able to remove the mask without help. Note: During the COVID-19 pandemic, medical grade facemasks are reserved forhealthcare workers and some first responders. Cover your coughs and sneezes Cover your mouth and nose with a tissue when you cough or sneeze. Throw away used tissues in a lined trash can. Immediately wash your hands with soap and water for at least 20 seconds. If soap and water are not available, clean your hands with an alcohol-based hand sanitizer that contains at least 60% alcohol. Clean your hands often Wash your hands often with soap and water for at least 20 seconds. This is especially important after blowing your nose, coughing, or sneezing; going to the bathroom; and before eating or preparing food. Use hand sanitizer if soap and water are not available. Use an  alcohol-based hand sanitizer with at least 60% alcohol, covering all surfaces of your hands and rubbing them together until they feel dry. Soap and water are the best option, especially if hands are visibly dirty. Avoid touching your eyes, nose, and mouth with unwashed hands. Handwashing Tips Avoid sharing personal household items Do not share dishes, drinking glasses, cups, eating utensils, towels, or bedding with other people in your home. Wash these items thoroughly after using them with soap and water or put in the dishwasher. Clean all "high-touch" surfaces every day Clean and disinfect high-touch surfaces in your "sick room" and bathroom; wear disposable gloves. Let someone else clean and disinfect surfaces in common areas, but you should clean your bedroom and bathroom, if possible. If a caregiver or other person needs to clean and disinfect a sick person's bedroom or bathroom, they should do so on an as-needed basis. The caregiver/other person should wear a mask and disposable gloves prior to cleaning. They should wait as long as possible after the person who is sick has used the bathroom before coming in to clean and use the bathroom. High-touch surfaces include phones, remote controls, counters, tabletops, doorknobs, bathroom fixtures, toilets, keyboards, tablets, and bedside tables. Clean and  disinfect areas that may have blood, stool, or body fluids on them. Use household cleaners and disinfectants. Clean the area or item with soap and water or another detergent if it is dirty. Then, use a household disinfectant. Be sure to follow the instructions on the label to ensure safe and effective use of the product. Many products recommend keeping the surface wet for several minutes to ensure germs are killed. Many also recommend precautions such as wearing gloves and making sure you have good ventilation during use of the product. Use a product from [employer]: Disinfectants for Coronavirus  ([id number]). Complete Disinfection Guidance When you can be around others after being sick with COVID-19 Deciding when you can be around others is different for different situations. Find out when you can safely end home isolation. For any additional questions about your care,contact your healthcare provider or state or local health department. 09/23/2020 Content source: [facility] ([facility]), Division of Viral Diseases This information is not intended to replace advice given to you by your health care provider. Make sure you discuss any questions you have with your healthcare provider. Document Revised: 11/20/2020 Document Reviewed: 11/20/2020 Elsevier Patient Education  [place].

## 2021-06-05 DIAGNOSIS — Z20822 Contact with and (suspected) exposure to covid-19: Secondary | ICD-10-CM | POA: Diagnosis not present

## 2021-06-14 DIAGNOSIS — H353231 Exudative age-related macular degeneration, bilateral, with active choroidal neovascularization: Secondary | ICD-10-CM | POA: Diagnosis not present

## 2021-06-15 DIAGNOSIS — G301 Alzheimer's disease with late onset: Secondary | ICD-10-CM | POA: Diagnosis not present

## 2021-06-15 DIAGNOSIS — S61412D Laceration without foreign body of left hand, subsequent encounter: Secondary | ICD-10-CM | POA: Diagnosis not present

## 2021-06-15 DIAGNOSIS — F0281 Dementia in other diseases classified elsewhere with behavioral disturbance: Secondary | ICD-10-CM | POA: Diagnosis not present

## 2021-06-15 DIAGNOSIS — N1831 Chronic kidney disease, stage 3a: Secondary | ICD-10-CM | POA: Diagnosis not present

## 2021-06-15 DIAGNOSIS — S12111D Posterior displaced Type II dens fracture, subsequent encounter for fracture with routine healing: Secondary | ICD-10-CM | POA: Diagnosis not present

## 2021-06-15 DIAGNOSIS — I129 Hypertensive chronic kidney disease with stage 1 through stage 4 chronic kidney disease, or unspecified chronic kidney disease: Secondary | ICD-10-CM | POA: Diagnosis not present

## 2021-06-16 DIAGNOSIS — I129 Hypertensive chronic kidney disease with stage 1 through stage 4 chronic kidney disease, or unspecified chronic kidney disease: Secondary | ICD-10-CM | POA: Diagnosis not present

## 2021-06-16 DIAGNOSIS — G301 Alzheimer's disease with late onset: Secondary | ICD-10-CM | POA: Diagnosis not present

## 2021-06-16 DIAGNOSIS — S12111D Posterior displaced Type II dens fracture, subsequent encounter for fracture with routine healing: Secondary | ICD-10-CM | POA: Diagnosis not present

## 2021-06-16 DIAGNOSIS — F0281 Dementia in other diseases classified elsewhere with behavioral disturbance: Secondary | ICD-10-CM | POA: Diagnosis not present

## 2021-06-16 DIAGNOSIS — S61412D Laceration without foreign body of left hand, subsequent encounter: Secondary | ICD-10-CM | POA: Diagnosis not present

## 2021-06-16 DIAGNOSIS — N1831 Chronic kidney disease, stage 3a: Secondary | ICD-10-CM | POA: Diagnosis not present

## 2021-06-17 DIAGNOSIS — N1831 Chronic kidney disease, stage 3a: Secondary | ICD-10-CM | POA: Diagnosis not present

## 2021-06-17 DIAGNOSIS — G301 Alzheimer's disease with late onset: Secondary | ICD-10-CM | POA: Diagnosis not present

## 2021-06-17 DIAGNOSIS — F0281 Dementia in other diseases classified elsewhere with behavioral disturbance: Secondary | ICD-10-CM | POA: Diagnosis not present

## 2021-06-17 DIAGNOSIS — S12111D Posterior displaced Type II dens fracture, subsequent encounter for fracture with routine healing: Secondary | ICD-10-CM | POA: Diagnosis not present

## 2021-06-17 DIAGNOSIS — I129 Hypertensive chronic kidney disease with stage 1 through stage 4 chronic kidney disease, or unspecified chronic kidney disease: Secondary | ICD-10-CM | POA: Diagnosis not present

## 2021-06-17 DIAGNOSIS — S61412D Laceration without foreign body of left hand, subsequent encounter: Secondary | ICD-10-CM | POA: Diagnosis not present

## 2021-06-23 DIAGNOSIS — N1831 Chronic kidney disease, stage 3a: Secondary | ICD-10-CM | POA: Diagnosis not present

## 2021-06-23 DIAGNOSIS — G301 Alzheimer's disease with late onset: Secondary | ICD-10-CM | POA: Diagnosis not present

## 2021-06-23 DIAGNOSIS — S12111D Posterior displaced Type II dens fracture, subsequent encounter for fracture with routine healing: Secondary | ICD-10-CM | POA: Diagnosis not present

## 2021-06-23 DIAGNOSIS — S61412D Laceration without foreign body of left hand, subsequent encounter: Secondary | ICD-10-CM | POA: Diagnosis not present

## 2021-06-23 DIAGNOSIS — F0281 Dementia in other diseases classified elsewhere with behavioral disturbance: Secondary | ICD-10-CM | POA: Diagnosis not present

## 2021-06-23 DIAGNOSIS — I129 Hypertensive chronic kidney disease with stage 1 through stage 4 chronic kidney disease, or unspecified chronic kidney disease: Secondary | ICD-10-CM | POA: Diagnosis not present

## 2021-06-24 DIAGNOSIS — F0281 Dementia in other diseases classified elsewhere with behavioral disturbance: Secondary | ICD-10-CM | POA: Diagnosis not present

## 2021-06-24 DIAGNOSIS — S61412D Laceration without foreign body of left hand, subsequent encounter: Secondary | ICD-10-CM | POA: Diagnosis not present

## 2021-06-24 DIAGNOSIS — N1831 Chronic kidney disease, stage 3a: Secondary | ICD-10-CM | POA: Diagnosis not present

## 2021-06-24 DIAGNOSIS — S12111D Posterior displaced Type II dens fracture, subsequent encounter for fracture with routine healing: Secondary | ICD-10-CM | POA: Diagnosis not present

## 2021-06-24 DIAGNOSIS — I129 Hypertensive chronic kidney disease with stage 1 through stage 4 chronic kidney disease, or unspecified chronic kidney disease: Secondary | ICD-10-CM | POA: Diagnosis not present

## 2021-06-24 DIAGNOSIS — G301 Alzheimer's disease with late onset: Secondary | ICD-10-CM | POA: Diagnosis not present

## 2021-06-27 DIAGNOSIS — K219 Gastro-esophageal reflux disease without esophagitis: Secondary | ICD-10-CM | POA: Diagnosis not present

## 2021-06-27 DIAGNOSIS — F329 Major depressive disorder, single episode, unspecified: Secondary | ICD-10-CM | POA: Diagnosis not present

## 2021-06-27 DIAGNOSIS — E039 Hypothyroidism, unspecified: Secondary | ICD-10-CM | POA: Diagnosis not present

## 2021-06-27 DIAGNOSIS — N1831 Chronic kidney disease, stage 3a: Secondary | ICD-10-CM | POA: Diagnosis not present

## 2021-06-27 DIAGNOSIS — M48061 Spinal stenosis, lumbar region without neurogenic claudication: Secondary | ICD-10-CM | POA: Diagnosis not present

## 2021-06-27 DIAGNOSIS — E7800 Pure hypercholesterolemia, unspecified: Secondary | ICD-10-CM | POA: Diagnosis not present

## 2021-06-27 DIAGNOSIS — H353130 Nonexudative age-related macular degeneration, bilateral, stage unspecified: Secondary | ICD-10-CM | POA: Diagnosis not present

## 2021-06-27 DIAGNOSIS — S12111D Posterior displaced Type II dens fracture, subsequent encounter for fracture with routine healing: Secondary | ICD-10-CM | POA: Diagnosis not present

## 2021-06-27 DIAGNOSIS — I272 Pulmonary hypertension, unspecified: Secondary | ICD-10-CM | POA: Diagnosis not present

## 2021-06-27 DIAGNOSIS — Z9181 History of falling: Secondary | ICD-10-CM | POA: Diagnosis not present

## 2021-06-27 DIAGNOSIS — I129 Hypertensive chronic kidney disease with stage 1 through stage 4 chronic kidney disease, or unspecified chronic kidney disease: Secondary | ICD-10-CM | POA: Diagnosis not present

## 2021-06-27 DIAGNOSIS — M810 Age-related osteoporosis without current pathological fracture: Secondary | ICD-10-CM | POA: Diagnosis not present

## 2021-06-27 DIAGNOSIS — F5104 Psychophysiologic insomnia: Secondary | ICD-10-CM | POA: Diagnosis not present

## 2021-06-27 DIAGNOSIS — I872 Venous insufficiency (chronic) (peripheral): Secondary | ICD-10-CM | POA: Diagnosis not present

## 2021-06-27 DIAGNOSIS — Z87891 Personal history of nicotine dependence: Secondary | ICD-10-CM | POA: Diagnosis not present

## 2021-06-27 DIAGNOSIS — F0281 Dementia in other diseases classified elsewhere with behavioral disturbance: Secondary | ICD-10-CM | POA: Diagnosis not present

## 2021-06-27 DIAGNOSIS — G301 Alzheimer's disease with late onset: Secondary | ICD-10-CM | POA: Diagnosis not present

## 2021-06-27 DIAGNOSIS — S61412D Laceration without foreign body of left hand, subsequent encounter: Secondary | ICD-10-CM | POA: Diagnosis not present

## 2021-06-29 ENCOUNTER — Telehealth: Payer: Self-pay | Admitting: *Deleted

## 2021-06-29 DIAGNOSIS — S12111D Posterior displaced Type II dens fracture, subsequent encounter for fracture with routine healing: Secondary | ICD-10-CM | POA: Diagnosis not present

## 2021-06-29 DIAGNOSIS — S61412D Laceration without foreign body of left hand, subsequent encounter: Secondary | ICD-10-CM | POA: Diagnosis not present

## 2021-06-29 DIAGNOSIS — N1831 Chronic kidney disease, stage 3a: Secondary | ICD-10-CM | POA: Diagnosis not present

## 2021-06-29 DIAGNOSIS — I129 Hypertensive chronic kidney disease with stage 1 through stage 4 chronic kidney disease, or unspecified chronic kidney disease: Secondary | ICD-10-CM | POA: Diagnosis not present

## 2021-06-29 DIAGNOSIS — F0281 Dementia in other diseases classified elsewhere with behavioral disturbance: Secondary | ICD-10-CM | POA: Diagnosis not present

## 2021-06-29 DIAGNOSIS — G301 Alzheimer's disease with late onset: Secondary | ICD-10-CM | POA: Diagnosis not present

## 2021-06-29 NOTE — Telephone Encounter (Signed)
May decrease calcium tablet to one by mouth daily

## 2021-06-29 NOTE — Telephone Encounter (Signed)
April Stevenson, son notified. Left directions on Voicemail. Son stated to [facility] if he didn't answer.

## 2021-06-29 NOTE — Telephone Encounter (Signed)
Patient son, Olga Millers called and stated that [place] had contacted you about stopping patient's Furosemide and it was agreed upon to STOP the Furosemide.   Son is wanting to know now if patient's Calcium Supplement be decreased. Stated that it is a huge pill that she has to take Three times a day and he has had to cut them in half for her to be able to swallow them.  Is wondering if they can Decrease these.   Please Advise.

## 2021-06-30 DIAGNOSIS — S12111D Posterior displaced Type II dens fracture, subsequent encounter for fracture with routine healing: Secondary | ICD-10-CM | POA: Diagnosis not present

## 2021-06-30 DIAGNOSIS — M542 Cervicalgia: Secondary | ICD-10-CM | POA: Diagnosis not present

## 2021-07-01 DIAGNOSIS — G301 Alzheimer's disease with late onset: Secondary | ICD-10-CM | POA: Diagnosis not present

## 2021-07-01 DIAGNOSIS — S12111D Posterior displaced Type II dens fracture, subsequent encounter for fracture with routine healing: Secondary | ICD-10-CM | POA: Diagnosis not present

## 2021-07-01 DIAGNOSIS — S61412D Laceration without foreign body of left hand, subsequent encounter: Secondary | ICD-10-CM | POA: Diagnosis not present

## 2021-07-01 DIAGNOSIS — F0281 Dementia in other diseases classified elsewhere with behavioral disturbance: Secondary | ICD-10-CM | POA: Diagnosis not present

## 2021-07-01 DIAGNOSIS — I129 Hypertensive chronic kidney disease with stage 1 through stage 4 chronic kidney disease, or unspecified chronic kidney disease: Secondary | ICD-10-CM | POA: Diagnosis not present

## 2021-07-01 DIAGNOSIS — N1831 Chronic kidney disease, stage 3a: Secondary | ICD-10-CM | POA: Diagnosis not present

## 2021-07-02 DIAGNOSIS — I129 Hypertensive chronic kidney disease with stage 1 through stage 4 chronic kidney disease, or unspecified chronic kidney disease: Secondary | ICD-10-CM | POA: Diagnosis not present

## 2021-07-02 DIAGNOSIS — S61412D Laceration without foreign body of left hand, subsequent encounter: Secondary | ICD-10-CM | POA: Diagnosis not present

## 2021-07-02 DIAGNOSIS — S12111D Posterior displaced Type II dens fracture, subsequent encounter for fracture with routine healing: Secondary | ICD-10-CM | POA: Diagnosis not present

## 2021-07-02 DIAGNOSIS — G301 Alzheimer's disease with late onset: Secondary | ICD-10-CM | POA: Diagnosis not present

## 2021-07-02 DIAGNOSIS — F0281 Dementia in other diseases classified elsewhere with behavioral disturbance: Secondary | ICD-10-CM | POA: Diagnosis not present

## 2021-07-02 DIAGNOSIS — N1831 Chronic kidney disease, stage 3a: Secondary | ICD-10-CM | POA: Diagnosis not present

## 2021-07-05 DIAGNOSIS — N1831 Chronic kidney disease, stage 3a: Secondary | ICD-10-CM | POA: Diagnosis not present

## 2021-07-05 DIAGNOSIS — S61412D Laceration without foreign body of left hand, subsequent encounter: Secondary | ICD-10-CM | POA: Diagnosis not present

## 2021-07-05 DIAGNOSIS — G301 Alzheimer's disease with late onset: Secondary | ICD-10-CM | POA: Diagnosis not present

## 2021-07-05 DIAGNOSIS — I129 Hypertensive chronic kidney disease with stage 1 through stage 4 chronic kidney disease, or unspecified chronic kidney disease: Secondary | ICD-10-CM | POA: Diagnosis not present

## 2021-07-05 DIAGNOSIS — S12111D Posterior displaced Type II dens fracture, subsequent encounter for fracture with routine healing: Secondary | ICD-10-CM | POA: Diagnosis not present

## 2021-07-05 DIAGNOSIS — F0281 Dementia in other diseases classified elsewhere with behavioral disturbance: Secondary | ICD-10-CM | POA: Diagnosis not present

## 2021-07-06 DIAGNOSIS — I129 Hypertensive chronic kidney disease with stage 1 through stage 4 chronic kidney disease, or unspecified chronic kidney disease: Secondary | ICD-10-CM | POA: Diagnosis not present

## 2021-07-06 DIAGNOSIS — F0281 Dementia in other diseases classified elsewhere with behavioral disturbance: Secondary | ICD-10-CM | POA: Diagnosis not present

## 2021-07-06 DIAGNOSIS — S61412D Laceration without foreign body of left hand, subsequent encounter: Secondary | ICD-10-CM | POA: Diagnosis not present

## 2021-07-06 DIAGNOSIS — S12111D Posterior displaced Type II dens fracture, subsequent encounter for fracture with routine healing: Secondary | ICD-10-CM | POA: Diagnosis not present

## 2021-07-06 DIAGNOSIS — G301 Alzheimer's disease with late onset: Secondary | ICD-10-CM | POA: Diagnosis not present

## 2021-07-06 DIAGNOSIS — N1831 Chronic kidney disease, stage 3a: Secondary | ICD-10-CM | POA: Diagnosis not present

## 2021-07-07 ENCOUNTER — Telehealth: Payer: Self-pay | Admitting: *Deleted

## 2021-07-07 DIAGNOSIS — F0281 Dementia in other diseases classified elsewhere with behavioral disturbance: Secondary | ICD-10-CM | POA: Diagnosis not present

## 2021-07-07 DIAGNOSIS — S61412D Laceration without foreign body of left hand, subsequent encounter: Secondary | ICD-10-CM | POA: Diagnosis not present

## 2021-07-07 DIAGNOSIS — I129 Hypertensive chronic kidney disease with stage 1 through stage 4 chronic kidney disease, or unspecified chronic kidney disease: Secondary | ICD-10-CM | POA: Diagnosis not present

## 2021-07-07 DIAGNOSIS — G301 Alzheimer's disease with late onset: Secondary | ICD-10-CM | POA: Diagnosis not present

## 2021-07-07 DIAGNOSIS — N1831 Chronic kidney disease, stage 3a: Secondary | ICD-10-CM | POA: Diagnosis not present

## 2021-07-07 DIAGNOSIS — S12111D Posterior displaced Type II dens fracture, subsequent encounter for fracture with routine healing: Secondary | ICD-10-CM | POA: Diagnosis not present

## 2021-07-07 NOTE — Telephone Encounter (Signed)
Dana with [facility] called and stated that Son, April Stevenson, has declined further need for Skilled Nursing for patient.   FYI

## 2021-07-07 NOTE — Telephone Encounter (Signed)
Noted  

## 2021-07-12 DIAGNOSIS — I129 Hypertensive chronic kidney disease with stage 1 through stage 4 chronic kidney disease, or unspecified chronic kidney disease: Secondary | ICD-10-CM | POA: Diagnosis not present

## 2021-07-12 DIAGNOSIS — N1831 Chronic kidney disease, stage 3a: Secondary | ICD-10-CM | POA: Diagnosis not present

## 2021-07-12 DIAGNOSIS — H353231 Exudative age-related macular degeneration, bilateral, with active choroidal neovascularization: Secondary | ICD-10-CM | POA: Diagnosis not present

## 2021-07-12 DIAGNOSIS — S12111D Posterior displaced Type II dens fracture, subsequent encounter for fracture with routine healing: Secondary | ICD-10-CM | POA: Diagnosis not present

## 2021-07-12 DIAGNOSIS — F0281 Dementia in other diseases classified elsewhere with behavioral disturbance: Secondary | ICD-10-CM | POA: Diagnosis not present

## 2021-07-12 DIAGNOSIS — H35373 Puckering of macula, bilateral: Secondary | ICD-10-CM | POA: Diagnosis not present

## 2021-07-12 DIAGNOSIS — S61412D Laceration without foreign body of left hand, subsequent encounter: Secondary | ICD-10-CM | POA: Diagnosis not present

## 2021-07-12 DIAGNOSIS — G301 Alzheimer's disease with late onset: Secondary | ICD-10-CM | POA: Diagnosis not present

## 2021-07-13 DIAGNOSIS — S61412D Laceration without foreign body of left hand, subsequent encounter: Secondary | ICD-10-CM | POA: Diagnosis not present

## 2021-07-13 DIAGNOSIS — S12111D Posterior displaced Type II dens fracture, subsequent encounter for fracture with routine healing: Secondary | ICD-10-CM | POA: Diagnosis not present

## 2021-07-13 DIAGNOSIS — N1831 Chronic kidney disease, stage 3a: Secondary | ICD-10-CM | POA: Diagnosis not present

## 2021-07-13 DIAGNOSIS — G301 Alzheimer's disease with late onset: Secondary | ICD-10-CM | POA: Diagnosis not present

## 2021-07-13 DIAGNOSIS — F0281 Dementia in other diseases classified elsewhere with behavioral disturbance: Secondary | ICD-10-CM | POA: Diagnosis not present

## 2021-07-13 DIAGNOSIS — I129 Hypertensive chronic kidney disease with stage 1 through stage 4 chronic kidney disease, or unspecified chronic kidney disease: Secondary | ICD-10-CM | POA: Diagnosis not present

## 2021-07-15 DIAGNOSIS — N1831 Chronic kidney disease, stage 3a: Secondary | ICD-10-CM | POA: Diagnosis not present

## 2021-07-15 DIAGNOSIS — I129 Hypertensive chronic kidney disease with stage 1 through stage 4 chronic kidney disease, or unspecified chronic kidney disease: Secondary | ICD-10-CM | POA: Diagnosis not present

## 2021-07-15 DIAGNOSIS — G301 Alzheimer's disease with late onset: Secondary | ICD-10-CM | POA: Diagnosis not present

## 2021-07-15 DIAGNOSIS — F0281 Dementia in other diseases classified elsewhere with behavioral disturbance: Secondary | ICD-10-CM | POA: Diagnosis not present

## 2021-07-15 DIAGNOSIS — S12111D Posterior displaced Type II dens fracture, subsequent encounter for fracture with routine healing: Secondary | ICD-10-CM | POA: Diagnosis not present

## 2021-07-15 DIAGNOSIS — S61412D Laceration without foreign body of left hand, subsequent encounter: Secondary | ICD-10-CM | POA: Diagnosis not present

## 2021-07-17 DIAGNOSIS — S61412D Laceration without foreign body of left hand, subsequent encounter: Secondary | ICD-10-CM | POA: Diagnosis not present

## 2021-07-17 DIAGNOSIS — S12111D Posterior displaced Type II dens fracture, subsequent encounter for fracture with routine healing: Secondary | ICD-10-CM | POA: Diagnosis not present

## 2021-07-17 DIAGNOSIS — G301 Alzheimer's disease with late onset: Secondary | ICD-10-CM | POA: Diagnosis not present

## 2021-07-17 DIAGNOSIS — I129 Hypertensive chronic kidney disease with stage 1 through stage 4 chronic kidney disease, or unspecified chronic kidney disease: Secondary | ICD-10-CM | POA: Diagnosis not present

## 2021-07-17 DIAGNOSIS — N1831 Chronic kidney disease, stage 3a: Secondary | ICD-10-CM | POA: Diagnosis not present

## 2021-07-17 DIAGNOSIS — F0281 Dementia in other diseases classified elsewhere with behavioral disturbance: Secondary | ICD-10-CM | POA: Diagnosis not present

## 2021-07-21 ENCOUNTER — Other Ambulatory Visit: Payer: Self-pay | Admitting: *Deleted

## 2021-07-21 MED ORDER — HYDRALAZINE HCL 25 MG PO TABS: 25.0000 mg | ORAL_TABLET | Freq: Three times a day (TID) | ORAL | 3 refills | Status: DC

## 2021-07-21 NOTE — Telephone Encounter (Signed)
Patient son called requesting a refill on patient's medication. Stated that medication was given to her in the hospital and she needs refill.   Pended Rx and sent to [facility] for approval.

## 2021-07-26 DIAGNOSIS — N1831 Chronic kidney disease, stage 3a: Secondary | ICD-10-CM | POA: Diagnosis not present

## 2021-07-26 DIAGNOSIS — F0281 Dementia in other diseases classified elsewhere with behavioral disturbance: Secondary | ICD-10-CM | POA: Diagnosis not present

## 2021-07-26 DIAGNOSIS — S61412D Laceration without foreign body of left hand, subsequent encounter: Secondary | ICD-10-CM | POA: Diagnosis not present

## 2021-07-26 DIAGNOSIS — I129 Hypertensive chronic kidney disease with stage 1 through stage 4 chronic kidney disease, or unspecified chronic kidney disease: Secondary | ICD-10-CM | POA: Diagnosis not present

## 2021-07-26 DIAGNOSIS — S12111D Posterior displaced Type II dens fracture, subsequent encounter for fracture with routine healing: Secondary | ICD-10-CM | POA: Diagnosis not present

## 2021-07-26 DIAGNOSIS — G301 Alzheimer's disease with late onset: Secondary | ICD-10-CM | POA: Diagnosis not present

## 2021-07-27 DIAGNOSIS — M5104 Intervertebral disc disorders with myelopathy, thoracic region: Secondary | ICD-10-CM | POA: Diagnosis not present

## 2021-07-27 DIAGNOSIS — Z9181 History of falling: Secondary | ICD-10-CM | POA: Diagnosis not present

## 2021-07-27 DIAGNOSIS — M810 Age-related osteoporosis without current pathological fracture: Secondary | ICD-10-CM | POA: Diagnosis not present

## 2021-07-27 DIAGNOSIS — F02818 Dementia in other diseases classified elsewhere, unspecified severity, with other behavioral disturbance: Secondary | ICD-10-CM | POA: Diagnosis not present

## 2021-07-27 DIAGNOSIS — H353130 Nonexudative age-related macular degeneration, bilateral, stage unspecified: Secondary | ICD-10-CM | POA: Diagnosis not present

## 2021-07-27 DIAGNOSIS — I872 Venous insufficiency (chronic) (peripheral): Secondary | ICD-10-CM | POA: Diagnosis not present

## 2021-07-27 DIAGNOSIS — K219 Gastro-esophageal reflux disease without esophagitis: Secondary | ICD-10-CM | POA: Diagnosis not present

## 2021-07-27 DIAGNOSIS — F5104 Psychophysiologic insomnia: Secondary | ICD-10-CM | POA: Diagnosis not present

## 2021-07-27 DIAGNOSIS — G301 Alzheimer's disease with late onset: Secondary | ICD-10-CM | POA: Diagnosis not present

## 2021-07-27 DIAGNOSIS — N1831 Chronic kidney disease, stage 3a: Secondary | ICD-10-CM | POA: Diagnosis not present

## 2021-07-27 DIAGNOSIS — Z87891 Personal history of nicotine dependence: Secondary | ICD-10-CM | POA: Diagnosis not present

## 2021-07-27 DIAGNOSIS — E7800 Pure hypercholesterolemia, unspecified: Secondary | ICD-10-CM | POA: Diagnosis not present

## 2021-07-27 DIAGNOSIS — I272 Pulmonary hypertension, unspecified: Secondary | ICD-10-CM | POA: Diagnosis not present

## 2021-07-27 DIAGNOSIS — I447 Left bundle-branch block, unspecified: Secondary | ICD-10-CM | POA: Diagnosis not present

## 2021-07-27 DIAGNOSIS — M48061 Spinal stenosis, lumbar region without neurogenic claudication: Secondary | ICD-10-CM | POA: Diagnosis not present

## 2021-07-27 DIAGNOSIS — F0283 Dementia in other diseases classified elsewhere, unspecified severity, with mood disturbance: Secondary | ICD-10-CM | POA: Diagnosis not present

## 2021-07-27 DIAGNOSIS — M5136 Other intervertebral disc degeneration, lumbar region: Secondary | ICD-10-CM | POA: Diagnosis not present

## 2021-07-27 DIAGNOSIS — R32 Unspecified urinary incontinence: Secondary | ICD-10-CM | POA: Diagnosis not present

## 2021-07-27 DIAGNOSIS — E039 Hypothyroidism, unspecified: Secondary | ICD-10-CM | POA: Diagnosis not present

## 2021-07-27 DIAGNOSIS — S12111D Posterior displaced Type II dens fracture, subsequent encounter for fracture with routine healing: Secondary | ICD-10-CM | POA: Diagnosis not present

## 2021-07-27 DIAGNOSIS — I129 Hypertensive chronic kidney disease with stage 1 through stage 4 chronic kidney disease, or unspecified chronic kidney disease: Secondary | ICD-10-CM | POA: Diagnosis not present

## 2021-08-03 DIAGNOSIS — I129 Hypertensive chronic kidney disease with stage 1 through stage 4 chronic kidney disease, or unspecified chronic kidney disease: Secondary | ICD-10-CM | POA: Diagnosis not present

## 2021-08-03 DIAGNOSIS — F0283 Dementia in other diseases classified elsewhere, unspecified severity, with mood disturbance: Secondary | ICD-10-CM | POA: Diagnosis not present

## 2021-08-03 DIAGNOSIS — F02818 Dementia in other diseases classified elsewhere, unspecified severity, with other behavioral disturbance: Secondary | ICD-10-CM | POA: Diagnosis not present

## 2021-08-03 DIAGNOSIS — S12111D Posterior displaced Type II dens fracture, subsequent encounter for fracture with routine healing: Secondary | ICD-10-CM | POA: Diagnosis not present

## 2021-08-03 DIAGNOSIS — G301 Alzheimer's disease with late onset: Secondary | ICD-10-CM | POA: Diagnosis not present

## 2021-08-03 DIAGNOSIS — N1831 Chronic kidney disease, stage 3a: Secondary | ICD-10-CM | POA: Diagnosis not present

## 2021-08-09 DIAGNOSIS — H353231 Exudative age-related macular degeneration, bilateral, with active choroidal neovascularization: Secondary | ICD-10-CM | POA: Diagnosis not present

## 2021-08-10 DIAGNOSIS — I129 Hypertensive chronic kidney disease with stage 1 through stage 4 chronic kidney disease, or unspecified chronic kidney disease: Secondary | ICD-10-CM | POA: Diagnosis not present

## 2021-08-10 DIAGNOSIS — F0283 Dementia in other diseases classified elsewhere, unspecified severity, with mood disturbance: Secondary | ICD-10-CM | POA: Diagnosis not present

## 2021-08-10 DIAGNOSIS — G301 Alzheimer's disease with late onset: Secondary | ICD-10-CM | POA: Diagnosis not present

## 2021-08-10 DIAGNOSIS — F02818 Dementia in other diseases classified elsewhere, unspecified severity, with other behavioral disturbance: Secondary | ICD-10-CM | POA: Diagnosis not present

## 2021-08-10 DIAGNOSIS — N1831 Chronic kidney disease, stage 3a: Secondary | ICD-10-CM | POA: Diagnosis not present

## 2021-08-10 DIAGNOSIS — S12111D Posterior displaced Type II dens fracture, subsequent encounter for fracture with routine healing: Secondary | ICD-10-CM | POA: Diagnosis not present

## 2021-08-11 DIAGNOSIS — G301 Alzheimer's disease with late onset: Secondary | ICD-10-CM | POA: Diagnosis not present

## 2021-08-11 DIAGNOSIS — I129 Hypertensive chronic kidney disease with stage 1 through stage 4 chronic kidney disease, or unspecified chronic kidney disease: Secondary | ICD-10-CM | POA: Diagnosis not present

## 2021-08-11 DIAGNOSIS — F0283 Dementia in other diseases classified elsewhere, unspecified severity, with mood disturbance: Secondary | ICD-10-CM | POA: Diagnosis not present

## 2021-08-11 DIAGNOSIS — F02818 Dementia in other diseases classified elsewhere, unspecified severity, with other behavioral disturbance: Secondary | ICD-10-CM | POA: Diagnosis not present

## 2021-08-11 DIAGNOSIS — S12111D Posterior displaced Type II dens fracture, subsequent encounter for fracture with routine healing: Secondary | ICD-10-CM | POA: Diagnosis not present

## 2021-08-11 DIAGNOSIS — N1831 Chronic kidney disease, stage 3a: Secondary | ICD-10-CM | POA: Diagnosis not present

## 2021-08-17 DIAGNOSIS — N1831 Chronic kidney disease, stage 3a: Secondary | ICD-10-CM | POA: Diagnosis not present

## 2021-08-17 DIAGNOSIS — I129 Hypertensive chronic kidney disease with stage 1 through stage 4 chronic kidney disease, or unspecified chronic kidney disease: Secondary | ICD-10-CM | POA: Diagnosis not present

## 2021-08-17 DIAGNOSIS — S12111D Posterior displaced Type II dens fracture, subsequent encounter for fracture with routine healing: Secondary | ICD-10-CM | POA: Diagnosis not present

## 2021-08-17 DIAGNOSIS — F02818 Dementia in other diseases classified elsewhere, unspecified severity, with other behavioral disturbance: Secondary | ICD-10-CM | POA: Diagnosis not present

## 2021-08-17 DIAGNOSIS — F0283 Dementia in other diseases classified elsewhere, unspecified severity, with mood disturbance: Secondary | ICD-10-CM | POA: Diagnosis not present

## 2021-08-17 DIAGNOSIS — G301 Alzheimer's disease with late onset: Secondary | ICD-10-CM | POA: Diagnosis not present

## 2021-08-18 DIAGNOSIS — G301 Alzheimer's disease with late onset: Secondary | ICD-10-CM | POA: Diagnosis not present

## 2021-08-18 DIAGNOSIS — N1831 Chronic kidney disease, stage 3a: Secondary | ICD-10-CM | POA: Diagnosis not present

## 2021-08-18 DIAGNOSIS — S12111D Posterior displaced Type II dens fracture, subsequent encounter for fracture with routine healing: Secondary | ICD-10-CM | POA: Diagnosis not present

## 2021-08-18 DIAGNOSIS — F02818 Dementia in other diseases classified elsewhere, unspecified severity, with other behavioral disturbance: Secondary | ICD-10-CM | POA: Diagnosis not present

## 2021-08-18 DIAGNOSIS — F0283 Dementia in other diseases classified elsewhere, unspecified severity, with mood disturbance: Secondary | ICD-10-CM | POA: Diagnosis not present

## 2021-08-18 DIAGNOSIS — I129 Hypertensive chronic kidney disease with stage 1 through stage 4 chronic kidney disease, or unspecified chronic kidney disease: Secondary | ICD-10-CM | POA: Diagnosis not present

## 2021-08-26 DIAGNOSIS — Z87891 Personal history of nicotine dependence: Secondary | ICD-10-CM | POA: Diagnosis not present

## 2021-08-26 DIAGNOSIS — M5104 Intervertebral disc disorders with myelopathy, thoracic region: Secondary | ICD-10-CM | POA: Diagnosis not present

## 2021-08-26 DIAGNOSIS — Z9181 History of falling: Secondary | ICD-10-CM | POA: Diagnosis not present

## 2021-08-26 DIAGNOSIS — I872 Venous insufficiency (chronic) (peripheral): Secondary | ICD-10-CM | POA: Diagnosis not present

## 2021-08-26 DIAGNOSIS — E039 Hypothyroidism, unspecified: Secondary | ICD-10-CM | POA: Diagnosis not present

## 2021-08-26 DIAGNOSIS — K219 Gastro-esophageal reflux disease without esophagitis: Secondary | ICD-10-CM | POA: Diagnosis not present

## 2021-08-26 DIAGNOSIS — M810 Age-related osteoporosis without current pathological fracture: Secondary | ICD-10-CM | POA: Diagnosis not present

## 2021-08-26 DIAGNOSIS — I129 Hypertensive chronic kidney disease with stage 1 through stage 4 chronic kidney disease, or unspecified chronic kidney disease: Secondary | ICD-10-CM | POA: Diagnosis not present

## 2021-08-26 DIAGNOSIS — R32 Unspecified urinary incontinence: Secondary | ICD-10-CM | POA: Diagnosis not present

## 2021-08-26 DIAGNOSIS — F5104 Psychophysiologic insomnia: Secondary | ICD-10-CM | POA: Diagnosis not present

## 2021-08-26 DIAGNOSIS — I447 Left bundle-branch block, unspecified: Secondary | ICD-10-CM | POA: Diagnosis not present

## 2021-08-26 DIAGNOSIS — N1831 Chronic kidney disease, stage 3a: Secondary | ICD-10-CM | POA: Diagnosis not present

## 2021-08-26 DIAGNOSIS — H353130 Nonexudative age-related macular degeneration, bilateral, stage unspecified: Secondary | ICD-10-CM | POA: Diagnosis not present

## 2021-08-26 DIAGNOSIS — G301 Alzheimer's disease with late onset: Secondary | ICD-10-CM | POA: Diagnosis not present

## 2021-08-26 DIAGNOSIS — M5136 Other intervertebral disc degeneration, lumbar region: Secondary | ICD-10-CM | POA: Diagnosis not present

## 2021-08-26 DIAGNOSIS — E7800 Pure hypercholesterolemia, unspecified: Secondary | ICD-10-CM | POA: Diagnosis not present

## 2021-08-26 DIAGNOSIS — M48061 Spinal stenosis, lumbar region without neurogenic claudication: Secondary | ICD-10-CM | POA: Diagnosis not present

## 2021-08-26 DIAGNOSIS — I272 Pulmonary hypertension, unspecified: Secondary | ICD-10-CM | POA: Diagnosis not present

## 2021-08-26 DIAGNOSIS — F02818 Dementia in other diseases classified elsewhere, unspecified severity, with other behavioral disturbance: Secondary | ICD-10-CM | POA: Diagnosis not present

## 2021-08-26 DIAGNOSIS — S12111D Posterior displaced Type II dens fracture, subsequent encounter for fracture with routine healing: Secondary | ICD-10-CM | POA: Diagnosis not present

## 2021-08-26 DIAGNOSIS — F0283 Dementia in other diseases classified elsewhere, unspecified severity, with mood disturbance: Secondary | ICD-10-CM | POA: Diagnosis not present

## 2021-08-31 DIAGNOSIS — N1831 Chronic kidney disease, stage 3a: Secondary | ICD-10-CM | POA: Diagnosis not present

## 2021-08-31 DIAGNOSIS — F0283 Dementia in other diseases classified elsewhere, unspecified severity, with mood disturbance: Secondary | ICD-10-CM | POA: Diagnosis not present

## 2021-08-31 DIAGNOSIS — S12111D Posterior displaced Type II dens fracture, subsequent encounter for fracture with routine healing: Secondary | ICD-10-CM | POA: Diagnosis not present

## 2021-08-31 DIAGNOSIS — I129 Hypertensive chronic kidney disease with stage 1 through stage 4 chronic kidney disease, or unspecified chronic kidney disease: Secondary | ICD-10-CM | POA: Diagnosis not present

## 2021-08-31 DIAGNOSIS — G301 Alzheimer's disease with late onset: Secondary | ICD-10-CM | POA: Diagnosis not present

## 2021-08-31 DIAGNOSIS — F02818 Dementia in other diseases classified elsewhere, unspecified severity, with other behavioral disturbance: Secondary | ICD-10-CM | POA: Diagnosis not present

## 2021-09-06 DIAGNOSIS — H353231 Exudative age-related macular degeneration, bilateral, with active choroidal neovascularization: Secondary | ICD-10-CM | POA: Diagnosis not present

## 2021-09-06 DIAGNOSIS — H31091 Other chorioretinal scars, right eye: Secondary | ICD-10-CM | POA: Diagnosis not present

## 2021-09-06 DIAGNOSIS — H35373 Puckering of macula, bilateral: Secondary | ICD-10-CM | POA: Diagnosis not present

## 2021-09-14 DIAGNOSIS — F0283 Dementia in other diseases classified elsewhere, unspecified severity, with mood disturbance: Secondary | ICD-10-CM | POA: Diagnosis not present

## 2021-09-14 DIAGNOSIS — S12111D Posterior displaced Type II dens fracture, subsequent encounter for fracture with routine healing: Secondary | ICD-10-CM | POA: Diagnosis not present

## 2021-09-14 DIAGNOSIS — N1831 Chronic kidney disease, stage 3a: Secondary | ICD-10-CM | POA: Diagnosis not present

## 2021-09-14 DIAGNOSIS — I129 Hypertensive chronic kidney disease with stage 1 through stage 4 chronic kidney disease, or unspecified chronic kidney disease: Secondary | ICD-10-CM | POA: Diagnosis not present

## 2021-09-14 DIAGNOSIS — G301 Alzheimer's disease with late onset: Secondary | ICD-10-CM | POA: Diagnosis not present

## 2021-09-14 DIAGNOSIS — F02818 Dementia in other diseases classified elsewhere, unspecified severity, with other behavioral disturbance: Secondary | ICD-10-CM | POA: Diagnosis not present

## 2021-09-21 DIAGNOSIS — I129 Hypertensive chronic kidney disease with stage 1 through stage 4 chronic kidney disease, or unspecified chronic kidney disease: Secondary | ICD-10-CM | POA: Diagnosis not present

## 2021-09-21 DIAGNOSIS — S12111D Posterior displaced Type II dens fracture, subsequent encounter for fracture with routine healing: Secondary | ICD-10-CM | POA: Diagnosis not present

## 2021-09-21 DIAGNOSIS — N1831 Chronic kidney disease, stage 3a: Secondary | ICD-10-CM | POA: Diagnosis not present

## 2021-09-21 DIAGNOSIS — G301 Alzheimer's disease with late onset: Secondary | ICD-10-CM | POA: Diagnosis not present

## 2021-09-21 DIAGNOSIS — F0283 Dementia in other diseases classified elsewhere, unspecified severity, with mood disturbance: Secondary | ICD-10-CM | POA: Diagnosis not present

## 2021-09-21 DIAGNOSIS — F02818 Dementia in other diseases classified elsewhere, unspecified severity, with other behavioral disturbance: Secondary | ICD-10-CM | POA: Diagnosis not present

## 2021-10-22 DIAGNOSIS — H353231 Exudative age-related macular degeneration, bilateral, with active choroidal neovascularization: Secondary | ICD-10-CM | POA: Diagnosis not present

## 2021-11-03 ENCOUNTER — Encounter: Payer: Self-pay | Admitting: Family

## 2021-11-04 ENCOUNTER — Ambulatory Visit (INDEPENDENT_AMBULATORY_CARE_PROVIDER_SITE_OTHER): Payer: Medicare Other | Admitting: Family

## 2021-11-04 ENCOUNTER — Other Ambulatory Visit: Payer: Self-pay

## 2021-11-04 ENCOUNTER — Encounter: Payer: Self-pay | Admitting: Family

## 2021-11-04 VITALS — BP 110/70 | HR 70 | Temp 97.3°F | Resp 16 | Ht 69.0 in | Wt 118.9 lb

## 2021-11-04 DIAGNOSIS — K219 Gastro-esophageal reflux disease without esophagitis: Secondary | ICD-10-CM

## 2021-11-04 DIAGNOSIS — K449 Diaphragmatic hernia without obstruction or gangrene: Secondary | ICD-10-CM | POA: Diagnosis not present

## 2021-11-04 DIAGNOSIS — E039 Hypothyroidism, unspecified: Secondary | ICD-10-CM | POA: Diagnosis not present

## 2021-11-04 DIAGNOSIS — R636 Underweight: Secondary | ICD-10-CM

## 2021-11-04 DIAGNOSIS — H353213 Exudative age-related macular degeneration, right eye, with inactive scar: Secondary | ICD-10-CM | POA: Diagnosis not present

## 2021-11-04 DIAGNOSIS — M51369 Other intervertebral disc degeneration, lumbar region without mention of lumbar back pain or lower extremity pain: Secondary | ICD-10-CM

## 2021-11-04 DIAGNOSIS — Z66 Do not resuscitate: Secondary | ICD-10-CM

## 2021-11-04 DIAGNOSIS — F02818 Alzheimer's disease with late onset: Secondary | ICD-10-CM

## 2021-11-04 DIAGNOSIS — R350 Frequency of micturition: Secondary | ICD-10-CM

## 2021-11-04 DIAGNOSIS — I872 Venous insufficiency (chronic) (peripheral): Secondary | ICD-10-CM

## 2021-11-04 DIAGNOSIS — Z681 Body mass index (BMI) 19 or less, adult: Secondary | ICD-10-CM

## 2021-11-04 DIAGNOSIS — M5136 Other intervertebral disc degeneration, lumbar region: Secondary | ICD-10-CM | POA: Diagnosis not present

## 2021-11-04 DIAGNOSIS — Z23 Encounter for immunization: Secondary | ICD-10-CM

## 2021-11-04 DIAGNOSIS — F5101 Primary insomnia: Secondary | ICD-10-CM

## 2021-11-04 DIAGNOSIS — F3341 Major depressive disorder, recurrent, in partial remission: Secondary | ICD-10-CM

## 2021-11-04 DIAGNOSIS — G301 Alzheimer's disease with late onset: Secondary | ICD-10-CM

## 2021-11-04 DIAGNOSIS — I272 Pulmonary hypertension, unspecified: Secondary | ICD-10-CM | POA: Diagnosis not present

## 2021-11-04 DIAGNOSIS — I10 Essential (primary) hypertension: Secondary | ICD-10-CM | POA: Diagnosis not present

## 2021-11-04 MED ORDER — DONEPEZIL HCL 10 MG PO TABS: 5.0000 mg | ORAL_TABLET | Freq: Every day | ORAL | 0 refills | Status: DC

## 2021-11-04 MED ORDER — TRAZODONE HCL 100 MG PO TABS: 100.0000 mg | ORAL_TABLET | Freq: Every day | ORAL | 0 refills | Status: AC

## 2021-11-04 MED ORDER — FUROSEMIDE 20 MG PO TABS: 20.0000 mg | ORAL_TABLET | Freq: Every day | ORAL | 0 refills | Status: AC

## 2021-11-04 NOTE — Progress Notes (Signed)
Provider: Marlowe Sax FNP-C   Jaslynne Dahan, Nelda Bucks, NP  Patient Care Team: Simon Llamas, Nelda Bucks, NP as PCP - General (Family Medicine) Jerline Pain, MD as PCP - Cardiology (Cardiology)  Extended Emergency Contact Information Primary Emergency Contact: Su Monks Address: [address]          [place], [place] [address] Johnnette Litter of [place] Work Phone: [phone] Mobile Phone: [phone] Relation: Son Secondary Emergency Contact: Flonnie Overman Address: [address]          [place], [place] [address] Johnnette Litter of [place] Mobile Phone: [phone] Relation: Relative  Code Status:  DNR Goals of care: Advanced Directive information Advanced Directives 11/03/2021  Does Patient Have a Medical Advance Directive? Yes  Type of Paramedic of [place];Living will  Does patient want to make changes to medical advance directive? No - Patient declined  Copy of [place] in Chart? Yes - validated most recent copy scanned in chart (See row information)  Would patient like information on creating a medical advance directive? -  Pre-existing out of facility DNR order (yellow form or pink MOST form) -     Chief Complaint  Patient presents with   Medical Management of Chronic Issues    6 month follow up.   Immunizations    Discuss the need for Covid Booster, and Influenza vaccine.    HPI:  Pt is a 88 y.o. female seen today for 6 months follow up for medical management of chronic diseases.Has medical history of Hypertension,Hypothyroidism,Pulmonary hypertension,Degenerative disc disease,chronic venous insufficiency,major depression in partial remission,late onset Alzheimer's disease ,Benign essential tremor among other conditions. Sleep is erratic watches TV and leaves it on during the night.  Had no fall recently.    Due for COVID-19 and Influenza vaccine.Son decline influenza vaccine to be administered.    Past Medical History:   Diagnosis Date   Benign essential tremor 07/18/2018   Bilateral nonexudative age-related macular degeneration 07/18/2018   Cholestatic hepatitis    2001   DDD (degenerative disc disease), lumbar 04/23/2018   Depression    Dry eyes, bilateral 07/18/2018   Fatigue    GERD (gastroesophageal reflux disease)    Hiatal hernia with GERD 07/29/2011   Hoarding behavior 04/23/2018   HTN (hypertension) 07/29/2011   Hyperlipidemia    Hypertension    Hypothyroidism 07/29/2011   Insomnia    Late onset Alzheimer's disease with behavioral disturbance ([place]) 04/23/2018   LBBB (left bundle branch block) 07/29/2011   Major depressive disorder 07/29/2016   Memory impairment 12/09/2016   Memory loss    Osteoporosis    Paranoid delusion ([place]) 07/18/2018   Psychophysiological insomnia 04/23/2018   Pulmonary HTN ([place]) 03/15/2019   2 d echo 02/28/19 EF 55-60 %, mild to moderate mitral valve regurg, mild tricuspid regurg, severely elevated estimated right ventricular systolic pressure 85 mm Hg, and mild to moderate aortic valve regurg. No diastolic CHF noted.    Pure hypercholesterolemia 07/29/2011   Sciatica    Senile osteoporosis 04/23/2018   Past Surgical History:  Procedure Laterality Date   APPENDECTOMY     CARDIAC CATHETERIZATION     FACIAL COSMETIC SURGERY     TONSILLECTOMY     TOTAL ABDOMINAL HYSTERECTOMY W/ BILATERAL SALPINGOOPHORECTOMY      Allergies  Allergen Reactions   Garlic    Lac Bovis    Lanolin    Penicillins     ? rash   Statins     myalgia  Allergies as of 11/04/2021       Reactions   Garlic    Lac Bovis    Lanolin    Penicillins    ? rash   Statins    myalgia        Medication List        Accurate as of November 04, 2021  9:35 AM. If you have any questions, ask your nurse or doctor.          STOP taking these medications    diclofenac Sodium 1 % Gel Commonly known as: Voltaren Stopped by: Sandrea Hughs, NP       TAKE these medications    calcium  carbonate 600 MG Tabs tablet Commonly known as: OS-CAL Take 600 mg by mouth daily with breakfast.   Cholecalciferol 50 MCG (2000 UT) Caps Commonly known as: D3 High Potency Take 1 capsule (2,000 Units total) by mouth daily.   donepezil 10 MG tablet Commonly known as: ARICEPT TAKE 1 TABLET(10 MG) BY MOUTH AT BEDTIME   FLUoxetine 20 MG tablet Commonly known as: PROZAC TAKE 1 TABLET(20 MG) BY MOUTH DAILY   furosemide 40 MG tablet Commonly known as: LASIX TAKE 1 TABLET(40 MG) BY MOUTH DAILY   hydrALAZINE 25 MG tablet Commonly known as: APRESOLINE Take 1 tablet (25 mg total) by mouth every 8 (eight) hours.   irbesartan 300 MG tablet Commonly known as: AVAPRO TAKE 1 TABLET(300 MG) BY MOUTH DAILY   levothyroxine 25 MCG tablet Commonly known as: SYNTHROID TAKE 1 TABLET(25 MCG) BY MOUTH AT BEDTIME   Melatonin 10 MG Tabs Take 1 tablet by mouth at bedtime.   methocarbamol 500 MG tablet Commonly known as: Robaxin Take 0.5 tablets (250 mg total) by mouth every 8 (eight) hours as needed for muscle spasms.   multivitamin with minerals Tabs tablet Take 1 tablet by mouth in the morning and at bedtime.   omeprazole 20 MG capsule Commonly known as: PRILOSEC TAKE 1 CAPSULE(20 MG) BY MOUTH DAILY   potassium chloride SA 20 MEQ tablet Commonly known as: KLOR-CON M TAKE 1 TABLET BY MOUTH EVERY DAY   traZODone 50 MG tablet Commonly known as: DESYREL TAKE 1 TABLET(50 MG) BY MOUTH AT BEDTIME   vitamin C 500 MG tablet Commonly known as: ASCORBIC ACID Take 1 tablet (500 mg total) by mouth daily.        Review of Systems  Constitutional:  Negative for appetite change, chills, fatigue, fever and unexpected weight change.  HENT:  Negative for congestion, dental problem, ear discharge, ear pain, facial swelling, hearing loss, nosebleeds, postnasal drip, rhinorrhea, sinus pressure, sinus pain, sneezing, sore throat, tinnitus and trouble swallowing.   Eyes:  Negative for pain,  discharge, redness, itching and visual disturbance.  Respiratory:  Negative for cough, chest tightness, shortness of breath and wheezing.   Cardiovascular:  Negative for chest pain, palpitations and leg swelling.  Gastrointestinal:  Negative for abdominal distention, abdominal pain, blood in stool, constipation, diarrhea, nausea and vomiting.  Endocrine: Negative for cold intolerance, heat intolerance, polydipsia, polyphagia and polyuria.  Genitourinary:  Negative for difficulty urinating, dysuria, flank pain, frequency and urgency.  Musculoskeletal:  Positive for arthralgias and gait problem. Negative for back pain, joint swelling, myalgias, neck pain and neck stiffness.  Skin:  Negative for color change, pallor, rash and wound.  Neurological:  Negative for dizziness, syncope, speech difficulty, weakness, light-headedness, numbness and headaches.       Chronic hand tremors   Hematological:  Does not bruise/bleed  easily.  Psychiatric/Behavioral:  Positive for sleep disturbance. Negative for agitation, behavioral problems, confusion, hallucinations, self-injury and suicidal ideas. The patient is not nervous/anxious.    Immunization History  Administered Date(s) Administered   Influenza Split 08/14/2009, 06/29/2010, 07/19/2011, 07/29/2016   Influenza, High Dose Seasonal PF 08/08/2012, 07/26/2013, 09/17/2014, 07/31/2015, 08/15/2019   Influenza, Quadrivalent, Recombinant, Inj, Pf 07/19/2017   Influenza,inj,Quad PF,6+ Mos 08/16/2018   Influenza,trivalent, recombinat, inj, PF 07/26/2016   Moderna Sars-Covid-2 Vaccination 10/22/2019, 11/19/2019   Pneumococcal Conjugate-13 08/21/2009   Pneumococcal Polysaccharide-23 04/20/2018   Td 10/13/1997   Tdap 08/31/2006   Zoster Recombinat (Shingrix) 05/25/2018, 08/19/2018   Zoster, Live 06/15/2011   Pertinent  Health Maintenance Due  Topic Date Due   INFLUENZA VACCINE  05/17/2021   DEXA SCAN  Completed   Fall Risk 05/25/2021 05/25/2021 05/26/2021  05/26/2021 05/27/2021  Falls in the past year? - - - - -  Number of falls in past year - - - - -  Was there an injury with Fall? - - - - -  Was there an injury with Fall? - - - - -  Fall Risk Category Calculator - - - - -  Fall Risk Category - - - - -  Patient Fall Risk Level High fall risk High fall risk High fall risk High fall risk High fall risk  Patient at Risk for Falls Due to - - - - -  Fall risk Follow up - - - - -   Functional Status Survey:    Vitals:   11/04/21 0927  BP: 110/70  Pulse: 70  Resp: 16  Temp: (!) 97.3 F (36.3 C)  SpO2: 93%  Weight: 118 lb 14.4 oz (53.9 kg)  Height: 5\' 9"  (1.753 m)   Body mass index is 17.56 kg/m. Physical Exam Vitals reviewed.  Constitutional:      General: She is not in acute distress.    Appearance: Normal appearance. She is normal weight. She is not ill-appearing or diaphoretic.  HENT:     Head: Normocephalic.     Right Ear: Tympanic membrane, ear canal and external ear normal. There is no impacted cerumen.     Left Ear: Tympanic membrane, ear canal and external ear normal. There is no impacted cerumen.     Nose: Nose normal. No congestion or rhinorrhea.     Mouth/Throat:     Mouth: Mucous membranes are moist.     Pharynx: Oropharynx is clear. No oropharyngeal exudate or posterior oropharyngeal erythema.  Eyes:     General: No scleral icterus.       Right eye: No discharge.        Left eye: No discharge.     Extraocular Movements: Extraocular movements intact.     Conjunctiva/sclera: Conjunctivae normal.     Pupils: Pupils are equal, round, and reactive to light.  Neck:     Vascular: No carotid bruit.  Cardiovascular:     Rate and Rhythm: Normal rate and regular rhythm.     Pulses: Normal pulses.     Heart sounds: Normal heart sounds. No murmur heard.   No friction rub. No gallop.  Pulmonary:     Effort: Pulmonary effort is normal. No respiratory distress.     Breath sounds: Normal breath sounds. No wheezing, rhonchi  or rales.  Chest:     Chest wall: No tenderness.  Abdominal:     General: Bowel sounds are normal. There is no distension.     Palpations: Abdomen is soft. There  is no mass.     Tenderness: There is no abdominal tenderness. There is no right CVA tenderness, left CVA tenderness, guarding or rebound.  Musculoskeletal:        General: No swelling or tenderness. Normal range of motion.     Cervical back: Normal range of motion. No rigidity or tenderness.     Right lower leg: No edema.     Left lower leg: No edema.  Lymphadenopathy:     Cervical: No cervical adenopathy.  Skin:    General: Skin is warm and dry.     Coloration: Skin is not pale.     Findings: No bruising, erythema, lesion or rash.  Neurological:     Mental Status: She is alert. Mental status is at baseline.     Cranial Nerves: No cranial nerve deficit.     Sensory: No sensory deficit.     Motor: No weakness.     Coordination: Coordination normal.     Gait: Gait abnormal.     Comments: Chronic hand tremors  Psychiatric:        Mood and Affect: Mood normal.        Speech: Speech normal.        Behavior: Behavior normal.        Thought Content: Thought content normal.        Judgment: Judgment normal.    Labs reviewed: Recent Labs    05/23/21 0148 05/24/21 0113 05/25/21 0113  NA 136 135 135  K 3.6 3.3* 3.5  CL 98 99 100  CO2 29 29 28   GLUCOSE 93 107* 106*  BUN 21 22 19   CREATININE 1.25* 1.26* 1.15*  CALCIUM 9.0 8.6* 8.8*  MG 1.7  --   --   PHOS 3.7  --   --    Recent Labs    05/21/21 0358 05/23/21 0148  AST 25 19  ALT 12 15  ALKPHOS 105 104  BILITOT 0.8 0.9  PROT 6.6 6.1*  ALBUMIN 3.5 3.0*   Recent Labs    01/14/21 1228 05/21/21 0358 05/22/21 0226 05/23/21 0148  WBC 9.8 11.7* 8.3 9.9  NEUTROABS 7,585 9.7*  --   --   HGB 13.3 12.3 11.4* 12.5  HCT 39.9 37.2 34.4* 37.4  MCV 94.8 94.4 94.8 93.5  PLT 169 187 182 214   Lab Results  Component Value Date   TSH 2.389 05/21/2021   No results  found for: HGBA1C Lab Results  Component Value Date   CHOL 244 (A) 04/19/2018   HDL 54 04/19/2018   LDLCALC 168 04/19/2018   TRIG 110 04/19/2018    Significant Diagnostic Results in last 30 days:  No results found.  Assessment/Plan 1. Essential hypertension B/p well controlled  - continue on hydralazine ,irbesartan and Lasix  Continue to monitor   2. Acquired hypothyroidism Lab Results  Component Value Date   TSH 2.389 05/21/2021  Continue on levothyroxine 25 mcg tablet daily on empty stomach   3. Hiatal hernia with GERD Stable  H/H normal  Symptoms controlled. H/H stable.No tarry or black stool reported   - advised to avoid eating meals late in the evening and to avoid aggravating foods and spices. - continue on Omeprazole   4. DDD (degenerative disc disease), lumbar Continue on current pain regimen.  5. Late onset Alzheimer's disease with behavioral disturbance ([place]) No new behavioral issues  Continue with supportive care   6. Recurrent major depressive disorder, in partial remission ([facility]) Mood stable  - continue  on fluoxetine   7. Pulmonary HTN ([place]) No signs of fluid overload  - continue on furosemide   8. Chronic venous insufficiency Continue on diuretic   9. Urine frequency Afebrile  Negative exam findings.unable to provide urine specimen Will send home with specimen cup then son will return to office  - POC Urinalysis Dipstick - Urine Culture  10. Primary insomnia Sleep hygiene advise including turning TV off at bedtime and avoiding electronic one hour before bedtime - continue on Trazodone  - traZODone (DESYREL) 100 MG tablet; Take 1 tablet (100 mg total) by mouth at bedtime.  Dispense: 90 tablet; Refill: 0  11. DNR (do not resuscitate) Reviewed in chart  - Do not attempt resuscitation (DNR)  12. Exudative age-related macular degeneration of right eye with inactive scar ([place]) Continue to follow up Ophthalmologist   13.  Underweight Encouraged protein supplement  - continue to monitor weight  Will D/C Aricept if weight loss persist   14. Body mass index (BMI) of 19 or less in adult BMI 17.76  Protein supplement as above.   Family/ staff Communication: Reviewed plan of care with patient and son verbalized understanding   Labs/tests ordered: None   Next Appointment : 6 months for medical management of chronic issues.Fasting Labs during visit.    Sandrea Hughs, NP

## 2021-11-05 LAB — COMPLETE METABOLIC PANEL WITH GFR
AG Ratio: 1.6 (calc) (ref 1.0–2.5)
ALT: 10 U/L (ref 6–29)
AST: 12 U/L (ref 10–35)
Albumin: 4.2 g/dL (ref 3.6–5.1)
Alkaline phosphatase (APISO): 89 U/L (ref 37–153)
BUN/Creatinine Ratio: 26 (calc) — ABNORMAL HIGH (ref 6–22)
BUN: 35 mg/dL — ABNORMAL HIGH (ref 7–25)
CO2: 36 mmol/L — ABNORMAL HIGH (ref 20–32)
Calcium: 9.9 mg/dL (ref 8.6–10.4)
Chloride: 96 mmol/L — ABNORMAL LOW (ref 98–110)
Creat: 1.33 mg/dL — ABNORMAL HIGH (ref 0.60–0.95)
Globulin: 2.6 g/dL (calc) (ref 1.9–3.7)
Glucose, Bld: 108 mg/dL — ABNORMAL HIGH (ref 65–99)
Potassium: 4.5 mmol/L (ref 3.5–5.3)
Sodium: 138 mmol/L (ref 135–146)
Total Bilirubin: 1.2 mg/dL (ref 0.2–1.2)
Total Protein: 6.8 g/dL (ref 6.1–8.1)
eGFR: 37 mL/min/{1.73_m2} — ABNORMAL LOW (ref >=60)

## 2021-11-05 LAB — URINE CULTURE
MICRO NUMBER:: 12892724
Result:: NO GROWTH
SPECIMEN QUALITY:: ADEQUATE

## 2021-11-05 LAB — CBC WITH DIFFERENTIAL/PLATELET
Absolute Monocytes: 915 cells/uL (ref 200–950)
Basophils Absolute: 45 cells/uL (ref 0–200)
Basophils Relative: 0.4 %
Eosinophils Absolute: 45 cells/uL (ref 15–500)
Eosinophils Relative: 0.4 %
HCT: 37.8 % (ref 35.0–45.0)
Hemoglobin: 12.8 g/dL (ref 11.7–15.5)
Lymphs Abs: 1345 cells/uL (ref 850–3900)
MCH: 31.3 pg (ref 27.0–33.0)
MCHC: 33.9 g/dL (ref 32.0–36.0)
MCV: 92.4 fL (ref 80.0–100.0)
MPV: 10.7 fL (ref 7.5–12.5)
Monocytes Relative: 8.1 %
Neutro Abs: 8950 cells/uL — ABNORMAL HIGH (ref 1500–7800)
Neutrophils Relative %: 79.2 %
Platelets: 168 10*3/uL (ref 140–400)
RBC: 4.09 10*6/uL (ref 3.80–5.10)
RDW: 12.8 % (ref 11.0–15.0)
Total Lymphocyte: 11.9 %
WBC: 11.3 10*3/uL — ABNORMAL HIGH (ref 3.8–10.8)

## 2021-11-05 LAB — TSH: TSH: 2.07 mIU/L (ref 0.40–4.50)

## 2021-11-08 ENCOUNTER — Other Ambulatory Visit: Payer: Self-pay | Admitting: Family

## 2021-11-14 DIAGNOSIS — H353213 Exudative age-related macular degeneration, right eye, with inactive scar: Secondary | ICD-10-CM | POA: Insufficient documentation

## 2021-11-15 ENCOUNTER — Other Ambulatory Visit: Payer: Medicare Other

## 2021-11-15 ENCOUNTER — Other Ambulatory Visit: Payer: Self-pay | Admitting: *Deleted

## 2021-11-15 ENCOUNTER — Other Ambulatory Visit: Payer: Self-pay

## 2021-11-15 DIAGNOSIS — D729 Disorder of white blood cells, unspecified: Secondary | ICD-10-CM

## 2021-11-15 LAB — CBC WITH DIFFERENTIAL/PLATELET
Absolute Monocytes: 438 cells/uL (ref 200–950)
Basophils Absolute: 22 cells/uL (ref 0–200)
Basophils Relative: 0.3 %
Eosinophils Absolute: 88 cells/uL (ref 15–500)
Eosinophils Relative: 1.2 %
HCT: 36 % (ref 35.0–45.0)
Hemoglobin: 11.8 g/dL (ref 11.7–15.5)
Lymphs Abs: 876 cells/uL (ref 850–3900)
MCH: 30.6 pg (ref 27.0–33.0)
MCHC: 32.8 g/dL (ref 32.0–36.0)
MCV: 93.3 fL (ref 80.0–100.0)
MPV: 9.7 fL (ref 7.5–12.5)
Monocytes Relative: 6 %
Neutro Abs: 5877 cells/uL (ref 1500–7800)
Neutrophils Relative %: 80.5 %
Platelets: 207 10*3/uL (ref 140–400)
RBC: 3.86 10*6/uL (ref 3.80–5.10)
RDW: 12.9 % (ref 11.0–15.0)
Total Lymphocyte: 12 %
WBC: 7.3 10*3/uL (ref 3.8–10.8)

## 2021-11-15 MED ORDER — HYDRALAZINE HCL 25 MG PO TABS: 25.0000 mg | ORAL_TABLET | Freq: Three times a day (TID) | ORAL | 1 refills | Status: AC

## 2021-11-15 NOTE — Telephone Encounter (Signed)
Patient son requested refill. Requested 90 day supply.

## 2021-12-06 DIAGNOSIS — H353231 Exudative age-related macular degeneration, bilateral, with active choroidal neovascularization: Secondary | ICD-10-CM | POA: Diagnosis not present

## 2021-12-10 ENCOUNTER — Ambulatory Visit (INDEPENDENT_AMBULATORY_CARE_PROVIDER_SITE_OTHER): Payer: Medicare Other | Admitting: Podiatry

## 2021-12-10 ENCOUNTER — Other Ambulatory Visit: Payer: Self-pay

## 2021-12-10 ENCOUNTER — Encounter: Payer: Self-pay | Admitting: Podiatry

## 2021-12-10 DIAGNOSIS — M79676 Pain in unspecified toe(s): Secondary | ICD-10-CM

## 2021-12-10 DIAGNOSIS — B351 Tinea unguium: Secondary | ICD-10-CM | POA: Diagnosis not present

## 2021-12-14 NOTE — Progress Notes (Signed)
°  Subjective:  Patient ID: April Stevenson, female    DOB: [birth year],  MRN: [id number]  [place] presents to clinic today for painful thick toenails that are difficult to trim. Pain interferes with ambulation. Aggravating factors include wearing enclosed shoe gear. Pain is relieved with periodic professional debridement.   Her daughter in law is present during today's visit. Daughter in law states April Stevenson continues to get injections for macular degeneration.  PCP is Advice worker and last visit was 11/04/2021  Allergies  Allergen Reactions   Garlic    Lac Bovis    Lanolin    Penicillins     ? rash   Statins     myalgia    Review of Systems: Negative except as noted in the HPI. Objective:   Constitutional April Stevenson is a pleasant 88 y.o. Caucasian female, WD, WN in NAD. AAO x 3.   Vascular Capillary refill time to digits immediate b/l. Palpable pedal pulses b/l LE. Pedal hair sparse. Lower extremity skin temperature gradient within normal limits. No cyanosis or clubbing noted.  Neurologic Normal speech. Oriented to person, place, and time. Protective sensation intact 5/5 intact bilaterally with 10g monofilament b/l.  Dermatologic No open wounds b/l lower extremities. No interdigital macerations b/l lower extremities. Toenails 1-5 b/l elongated, discolored, dystrophic, thickened, crumbly with subungual debris and tenderness to dorsal palpation. Incurvated nailplate medial border(s) L hallux.  Nail border hypertrophy minimal. There is tenderness to palpation. Sign(s) of infection: no clinical signs of infection noted on examination today..  Orthopedic: Normal muscle strength 5/5 to all lower extremity muscle groups bilaterally. No pain crepitus or joint limitation noted with ROM b/l. No gross bony deformities bilaterally.   Radiographs: None Assessment:   1. Pain due to onychomycosis of toenail    Plan:  -Toenails 1-5 bilaterally  were debrided in length and girth with sterile nail nippers and dremel. Pinpoint bleeding of R hallux addressed with Lumicain Hemostatic Solution, cleansed with alcohol. triple antibiotic ointment applied. Patient instructed to apply Neosporin Cream once daily for 7 days.  -Offending nail border debrided and curretaged left great toe utilizing sterile nail nipper and currette. Border(s) cleansed with alcohol and triple antibiotic ointment applied. Patient instructed to apply Neosporin Cream  to L hallux and R hallux once daily for 7 days. -Patient to report any pedal injuries to medical professional immediately. -Patient/POA to call should there be question/concern in the interim.  Return in about 3 months (around 03/09/2022).  Marzetta Board, DPM

## 2022-01-17 DIAGNOSIS — H35033 Hypertensive retinopathy, bilateral: Secondary | ICD-10-CM | POA: Diagnosis not present

## 2022-01-17 DIAGNOSIS — H35373 Puckering of macula, bilateral: Secondary | ICD-10-CM | POA: Diagnosis not present

## 2022-01-17 DIAGNOSIS — H31091 Other chorioretinal scars, right eye: Secondary | ICD-10-CM | POA: Diagnosis not present

## 2022-01-17 DIAGNOSIS — H353231 Exudative age-related macular degeneration, bilateral, with active choroidal neovascularization: Secondary | ICD-10-CM | POA: Diagnosis not present

## 2022-01-31 ENCOUNTER — Other Ambulatory Visit: Payer: Self-pay | Admitting: Family

## 2022-02-02 ENCOUNTER — Other Ambulatory Visit: Payer: Self-pay | Admitting: Family

## 2022-02-02 DIAGNOSIS — F3341 Major depressive disorder, recurrent, in partial remission: Secondary | ICD-10-CM

## 2022-02-02 DIAGNOSIS — I872 Venous insufficiency (chronic) (peripheral): Secondary | ICD-10-CM

## 2022-02-02 NOTE — Telephone Encounter (Signed)
High risk warning populated when attempting to approve refill request. Will send to Ngetich, Dinah C, NP to review.   

## 2022-02-04 ENCOUNTER — Other Ambulatory Visit: Payer: Self-pay | Admitting: Family

## 2022-02-28 DIAGNOSIS — H353231 Exudative age-related macular degeneration, bilateral, with active choroidal neovascularization: Secondary | ICD-10-CM | POA: Diagnosis not present

## 2022-03-02 ENCOUNTER — Inpatient Hospital Stay (HOSPITAL_COMMUNITY): Payer: Medicare Other

## 2022-03-02 ENCOUNTER — Emergency Department (HOSPITAL_COMMUNITY): Payer: Medicare Other

## 2022-03-02 ENCOUNTER — Inpatient Hospital Stay (HOSPITAL_COMMUNITY)
Admission: EM | Admit: 2022-03-02 | Discharge: 2022-03-05 | DRG: 444 | Disposition: A | Discharge status: Home Health | Payer: Medicare Other | Attending: Internal Medicine | Admitting: Internal Medicine

## 2022-03-02 ENCOUNTER — Other Ambulatory Visit: Payer: Self-pay

## 2022-03-02 DIAGNOSIS — M810 Age-related osteoporosis without current pathological fracture: Secondary | ICD-10-CM | POA: Diagnosis not present

## 2022-03-02 DIAGNOSIS — R32 Unspecified urinary incontinence: Secondary | ICD-10-CM | POA: Diagnosis present

## 2022-03-02 DIAGNOSIS — F329 Major depressive disorder, single episode, unspecified: Secondary | ICD-10-CM | POA: Diagnosis not present

## 2022-03-02 DIAGNOSIS — W19XXXA Unspecified fall, initial encounter: Secondary | ICD-10-CM | POA: Diagnosis present

## 2022-03-02 DIAGNOSIS — I517 Cardiomegaly: Secondary | ICD-10-CM | POA: Diagnosis not present

## 2022-03-02 DIAGNOSIS — N281 Cyst of kidney, acquired: Secondary | ICD-10-CM | POA: Diagnosis not present

## 2022-03-02 DIAGNOSIS — R4182 Altered mental status, unspecified: Secondary | ICD-10-CM | POA: Diagnosis not present

## 2022-03-02 DIAGNOSIS — K8020 Calculus of gallbladder without cholecystitis without obstruction: Secondary | ICD-10-CM | POA: Diagnosis present

## 2022-03-02 DIAGNOSIS — I959 Hypotension, unspecified: Secondary | ICD-10-CM | POA: Diagnosis not present

## 2022-03-02 DIAGNOSIS — K828 Other specified diseases of gallbladder: Secondary | ICD-10-CM | POA: Diagnosis not present

## 2022-03-02 DIAGNOSIS — I130 Hypertensive heart and chronic kidney disease with heart failure and stage 1 through stage 4 chronic kidney disease, or unspecified chronic kidney disease: Secondary | ICD-10-CM | POA: Diagnosis present

## 2022-03-02 DIAGNOSIS — K449 Diaphragmatic hernia without obstruction or gangrene: Secondary | ICD-10-CM | POA: Diagnosis present

## 2022-03-02 DIAGNOSIS — Z91048 Other nonmedicinal substance allergy status: Secondary | ICD-10-CM

## 2022-03-02 DIAGNOSIS — I10 Essential (primary) hypertension: Secondary | ICD-10-CM | POA: Diagnosis not present

## 2022-03-02 DIAGNOSIS — Z888 Allergy status to other drugs, medicaments and biological substances status: Secondary | ICD-10-CM

## 2022-03-02 DIAGNOSIS — I083 Combined rheumatic disorders of mitral, aortic and tricuspid valves: Secondary | ICD-10-CM | POA: Diagnosis not present

## 2022-03-02 DIAGNOSIS — K810 Acute cholecystitis: Principal | ICD-10-CM

## 2022-03-02 DIAGNOSIS — S12190A Other displaced fracture of second cervical vertebra, initial encounter for closed fracture: Secondary | ICD-10-CM | POA: Diagnosis not present

## 2022-03-02 DIAGNOSIS — E7800 Pure hypercholesterolemia, unspecified: Secondary | ICD-10-CM | POA: Diagnosis present

## 2022-03-02 DIAGNOSIS — E039 Hypothyroidism, unspecified: Secondary | ICD-10-CM | POA: Diagnosis not present

## 2022-03-02 DIAGNOSIS — J189 Pneumonia, unspecified organism: Secondary | ICD-10-CM

## 2022-03-02 DIAGNOSIS — Z79899 Other long term (current) drug therapy: Secondary | ICD-10-CM

## 2022-03-02 DIAGNOSIS — M4312 Spondylolisthesis, cervical region: Secondary | ICD-10-CM | POA: Diagnosis not present

## 2022-03-02 DIAGNOSIS — Z9181 History of falling: Secondary | ICD-10-CM | POA: Diagnosis not present

## 2022-03-02 DIAGNOSIS — F02818 Dementia in other diseases classified elsewhere, unspecified severity, with other behavioral disturbance: Secondary | ICD-10-CM | POA: Diagnosis not present

## 2022-03-02 DIAGNOSIS — K5641 Fecal impaction: Secondary | ICD-10-CM | POA: Diagnosis not present

## 2022-03-02 DIAGNOSIS — H353130 Nonexudative age-related macular degeneration, bilateral, stage unspecified: Secondary | ICD-10-CM | POA: Diagnosis present

## 2022-03-02 DIAGNOSIS — Z66 Do not resuscitate: Secondary | ICD-10-CM | POA: Diagnosis present

## 2022-03-02 DIAGNOSIS — R079 Chest pain, unspecified: Secondary | ICD-10-CM | POA: Diagnosis not present

## 2022-03-02 DIAGNOSIS — Z515 Encounter for palliative care: Secondary | ICD-10-CM | POA: Diagnosis not present

## 2022-03-02 DIAGNOSIS — I272 Pulmonary hypertension, unspecified: Secondary | ICD-10-CM | POA: Diagnosis not present

## 2022-03-02 DIAGNOSIS — G301 Alzheimer's disease with late onset: Secondary | ICD-10-CM | POA: Diagnosis present

## 2022-03-02 DIAGNOSIS — J690 Pneumonitis due to inhalation of food and vomit: Secondary | ICD-10-CM | POA: Diagnosis present

## 2022-03-02 DIAGNOSIS — R7989 Other specified abnormal findings of blood chemistry: Secondary | ICD-10-CM | POA: Diagnosis not present

## 2022-03-02 DIAGNOSIS — J90 Pleural effusion, not elsewhere classified: Secondary | ICD-10-CM | POA: Diagnosis not present

## 2022-03-02 DIAGNOSIS — K5900 Constipation, unspecified: Secondary | ICD-10-CM | POA: Diagnosis not present

## 2022-03-02 DIAGNOSIS — Z043 Encounter for examination and observation following other accident: Secondary | ICD-10-CM | POA: Diagnosis not present

## 2022-03-02 DIAGNOSIS — Z880 Allergy status to penicillin: Secondary | ICD-10-CM

## 2022-03-02 DIAGNOSIS — I5032 Chronic diastolic (congestive) heart failure: Secondary | ICD-10-CM | POA: Diagnosis present

## 2022-03-02 DIAGNOSIS — Z8249 Family history of ischemic heart disease and other diseases of the circulatory system: Secondary | ICD-10-CM | POA: Diagnosis not present

## 2022-03-02 DIAGNOSIS — D696 Thrombocytopenia, unspecified: Secondary | ICD-10-CM | POA: Diagnosis not present

## 2022-03-02 DIAGNOSIS — R109 Unspecified abdominal pain: Secondary | ICD-10-CM | POA: Diagnosis not present

## 2022-03-02 DIAGNOSIS — Y92009 Unspecified place in unspecified non-institutional (private) residence as the place of occurrence of the external cause: Secondary | ICD-10-CM

## 2022-03-02 DIAGNOSIS — I447 Left bundle-branch block, unspecified: Secondary | ICD-10-CM | POA: Diagnosis present

## 2022-03-02 DIAGNOSIS — F02C18 Dementia in other diseases classified elsewhere, severe, with other behavioral disturbance: Secondary | ICD-10-CM | POA: Diagnosis not present

## 2022-03-02 DIAGNOSIS — Z90710 Acquired absence of both cervix and uterus: Secondary | ICD-10-CM

## 2022-03-02 DIAGNOSIS — J9811 Atelectasis: Secondary | ICD-10-CM | POA: Diagnosis present

## 2022-03-02 DIAGNOSIS — Z87891 Personal history of nicotine dependence: Secondary | ICD-10-CM

## 2022-03-02 DIAGNOSIS — S12110A Anterior displaced Type II dens fracture, initial encounter for closed fracture: Secondary | ICD-10-CM | POA: Diagnosis not present

## 2022-03-02 DIAGNOSIS — N1832 Chronic kidney disease, stage 3b: Secondary | ICD-10-CM | POA: Diagnosis present

## 2022-03-02 DIAGNOSIS — S13120A Subluxation of C1/C2 cervical vertebrae, initial encounter: Secondary | ICD-10-CM | POA: Diagnosis not present

## 2022-03-02 DIAGNOSIS — K219 Gastro-esophageal reflux disease without esophagitis: Secondary | ICD-10-CM | POA: Diagnosis present

## 2022-03-02 DIAGNOSIS — R0902 Hypoxemia: Secondary | ICD-10-CM | POA: Diagnosis not present

## 2022-03-02 DIAGNOSIS — K819 Cholecystitis, unspecified: Secondary | ICD-10-CM

## 2022-03-02 DIAGNOSIS — I6782 Cerebral ischemia: Secondary | ICD-10-CM | POA: Diagnosis not present

## 2022-03-02 DIAGNOSIS — S0990XA Unspecified injury of head, initial encounter: Secondary | ICD-10-CM | POA: Diagnosis not present

## 2022-03-02 DIAGNOSIS — R7401 Elevation of levels of liver transaminase levels: Secondary | ICD-10-CM | POA: Diagnosis not present

## 2022-03-02 DIAGNOSIS — Z91018 Allergy to other foods: Secondary | ICD-10-CM

## 2022-03-02 DIAGNOSIS — R945 Abnormal results of liver function studies: Secondary | ICD-10-CM | POA: Diagnosis not present

## 2022-03-02 DIAGNOSIS — Z9049 Acquired absence of other specified parts of digestive tract: Secondary | ICD-10-CM

## 2022-03-02 DIAGNOSIS — Z79890 Hormone replacement therapy (postmenopausal): Secondary | ICD-10-CM

## 2022-03-02 DIAGNOSIS — I700 Atherosclerosis of aorta: Secondary | ICD-10-CM | POA: Diagnosis not present

## 2022-03-02 LAB — COMPREHENSIVE METABOLIC PANEL
ALT: 526 U/L — ABNORMAL HIGH (ref 0–44)
ALT: 426 U/L — ABNORMAL HIGH (ref 0–44)
AST: 488 U/L — ABNORMAL HIGH (ref 15–41)
AST: 314 U/L — ABNORMAL HIGH (ref 15–41)
Albumin: 2.9 g/dL — ABNORMAL LOW (ref 3.5–5.0)
Albumin: 2.7 g/dL — ABNORMAL LOW (ref 3.5–5.0)
Alkaline Phosphatase: 262 U/L — ABNORMAL HIGH (ref 38–126)
Alkaline Phosphatase: 248 U/L — ABNORMAL HIGH (ref 38–126)
Anion gap: 9 (ref 5–15)
Anion gap: 9 (ref 5–15)
BUN: 22 mg/dL (ref 8–23)
BUN: 20 mg/dL (ref 8–23)
CO2: 25 mmol/L (ref 22–32)
CO2: 25 mmol/L (ref 22–32)
Calcium: 8.8 mg/dL — ABNORMAL LOW (ref 8.9–10.3)
Calcium: 8.7 mg/dL — ABNORMAL LOW (ref 8.9–10.3)
Chloride: 100 mmol/L (ref 98–111)
Chloride: 105 mmol/L (ref 98–111)
Creatinine, Ser: 1.21 mg/dL — ABNORMAL HIGH (ref 0.44–1.00)
Creatinine, Ser: 1.16 mg/dL — ABNORMAL HIGH (ref 0.44–1.00)
GFR, Estimated: 41 mL/min — ABNORMAL LOW (ref >60)
GFR, Estimated: 43 mL/min — ABNORMAL LOW (ref >60)
Glucose, Bld: 165 mg/dL — ABNORMAL HIGH (ref 70–99)
Glucose, Bld: 107 mg/dL — ABNORMAL HIGH (ref 70–99)
Potassium: 3.9 mmol/L (ref 3.5–5.1)
Potassium: 4 mmol/L (ref 3.5–5.1)
Sodium: 134 mmol/L — ABNORMAL LOW (ref 135–145)
Sodium: 139 mmol/L (ref 135–145)
Total Bilirubin: 4.7 mg/dL — ABNORMAL HIGH (ref 0.3–1.2)
Total Bilirubin: 4.1 mg/dL — ABNORMAL HIGH (ref 0.3–1.2)
Total Protein: 5.9 g/dL — ABNORMAL LOW (ref 6.5–8.1)
Total Protein: 5.5 g/dL — ABNORMAL LOW (ref 6.5–8.1)

## 2022-03-02 LAB — URINALYSIS, ROUTINE W REFLEX MICROSCOPIC
Bacteria, UA: NONE SEEN
Glucose, UA: >=500 mg/dL — AB
Ketones, ur: NEGATIVE mg/dL
Leukocytes,Ua: NEGATIVE
Nitrite: NEGATIVE
Protein, ur: 30 mg/dL — AB
Specific Gravity, Urine: 1.02 (ref 1.005–1.030)
pH: 5 (ref 5.0–8.0)

## 2022-03-02 LAB — ACETAMINOPHEN LEVEL: Acetaminophen (Tylenol), Serum: <10 ug/mL — ABNORMAL LOW (ref 10–30)

## 2022-03-02 LAB — CBC WITH DIFFERENTIAL/PLATELET
Abs Immature Granulocytes: 0.11 10*3/uL — ABNORMAL HIGH (ref 0.00–0.07)
Basophils Absolute: 0 10*3/uL (ref 0.0–0.1)
Basophils Relative: 0 %
Eosinophils Absolute: 0 10*3/uL (ref 0.0–0.5)
Eosinophils Relative: 0 %
HCT: 35 % — ABNORMAL LOW (ref 36.0–46.0)
Hemoglobin: 11.8 g/dL — ABNORMAL LOW (ref 12.0–15.0)
Immature Granulocytes: 1 %
Lymphocytes Relative: 1 %
Lymphs Abs: 0.2 10*3/uL — ABNORMAL LOW (ref 0.7–4.0)
MCH: 31.7 pg (ref 26.0–34.0)
MCHC: 33.7 g/dL (ref 30.0–36.0)
MCV: 94.1 fL (ref 80.0–100.0)
Monocytes Absolute: 0.9 10*3/uL (ref 0.1–1.0)
Monocytes Relative: 5 %
Neutro Abs: 16.7 10*3/uL — ABNORMAL HIGH (ref 1.7–7.7)
Neutrophils Relative %: 93 %
Platelets: 144 10*3/uL — ABNORMAL LOW (ref 150–400)
RBC: 3.72 MIL/uL — ABNORMAL LOW (ref 3.87–5.11)
RDW: 13.3 % (ref 11.5–15.5)
WBC: 17.9 10*3/uL — ABNORMAL HIGH (ref 4.0–10.5)
nRBC: 0 % (ref 0.0–0.2)

## 2022-03-02 LAB — AMMONIA: Ammonia: 25 umol/L (ref 9–35)

## 2022-03-02 LAB — PROTIME-INR
INR: 1.8 — ABNORMAL HIGH (ref 0.8–1.2)
Prothrombin Time: 20.4 seconds — ABNORMAL HIGH (ref 11.4–15.2)

## 2022-03-02 LAB — PROCALCITONIN: Procalcitonin: 3.65 ng/mL

## 2022-03-02 LAB — HEPATITIS PANEL, ACUTE
HCV Ab: NONREACTIVE
Hep A IgM: NONREACTIVE
Hep B C IgM: NONREACTIVE
Hepatitis B Surface Ag: NONREACTIVE

## 2022-03-02 LAB — CBG MONITORING, ED: Glucose-Capillary: 158 mg/dL — ABNORMAL HIGH (ref 70–99)

## 2022-03-02 LAB — TSH: TSH: 1.218 u[IU]/mL (ref 0.350–4.500)

## 2022-03-02 LAB — CK: Total CK: 847 U/L — ABNORMAL HIGH (ref 38–234)

## 2022-03-02 LAB — LACTIC ACID, PLASMA: Lactic Acid, Venous: 2.1 mmol/L (ref 0.5–1.9)

## 2022-03-02 IMAGING — DX DG CHEST 1V PORT
1 series · 1 of 1 positions shown · non-contrast
Comparison: Chest x-ray [DATE].

CLINICAL DATA: [AGE] female with history of altered mental
status.

EXAM:
PORTABLE CHEST 1 VIEW

[chest]
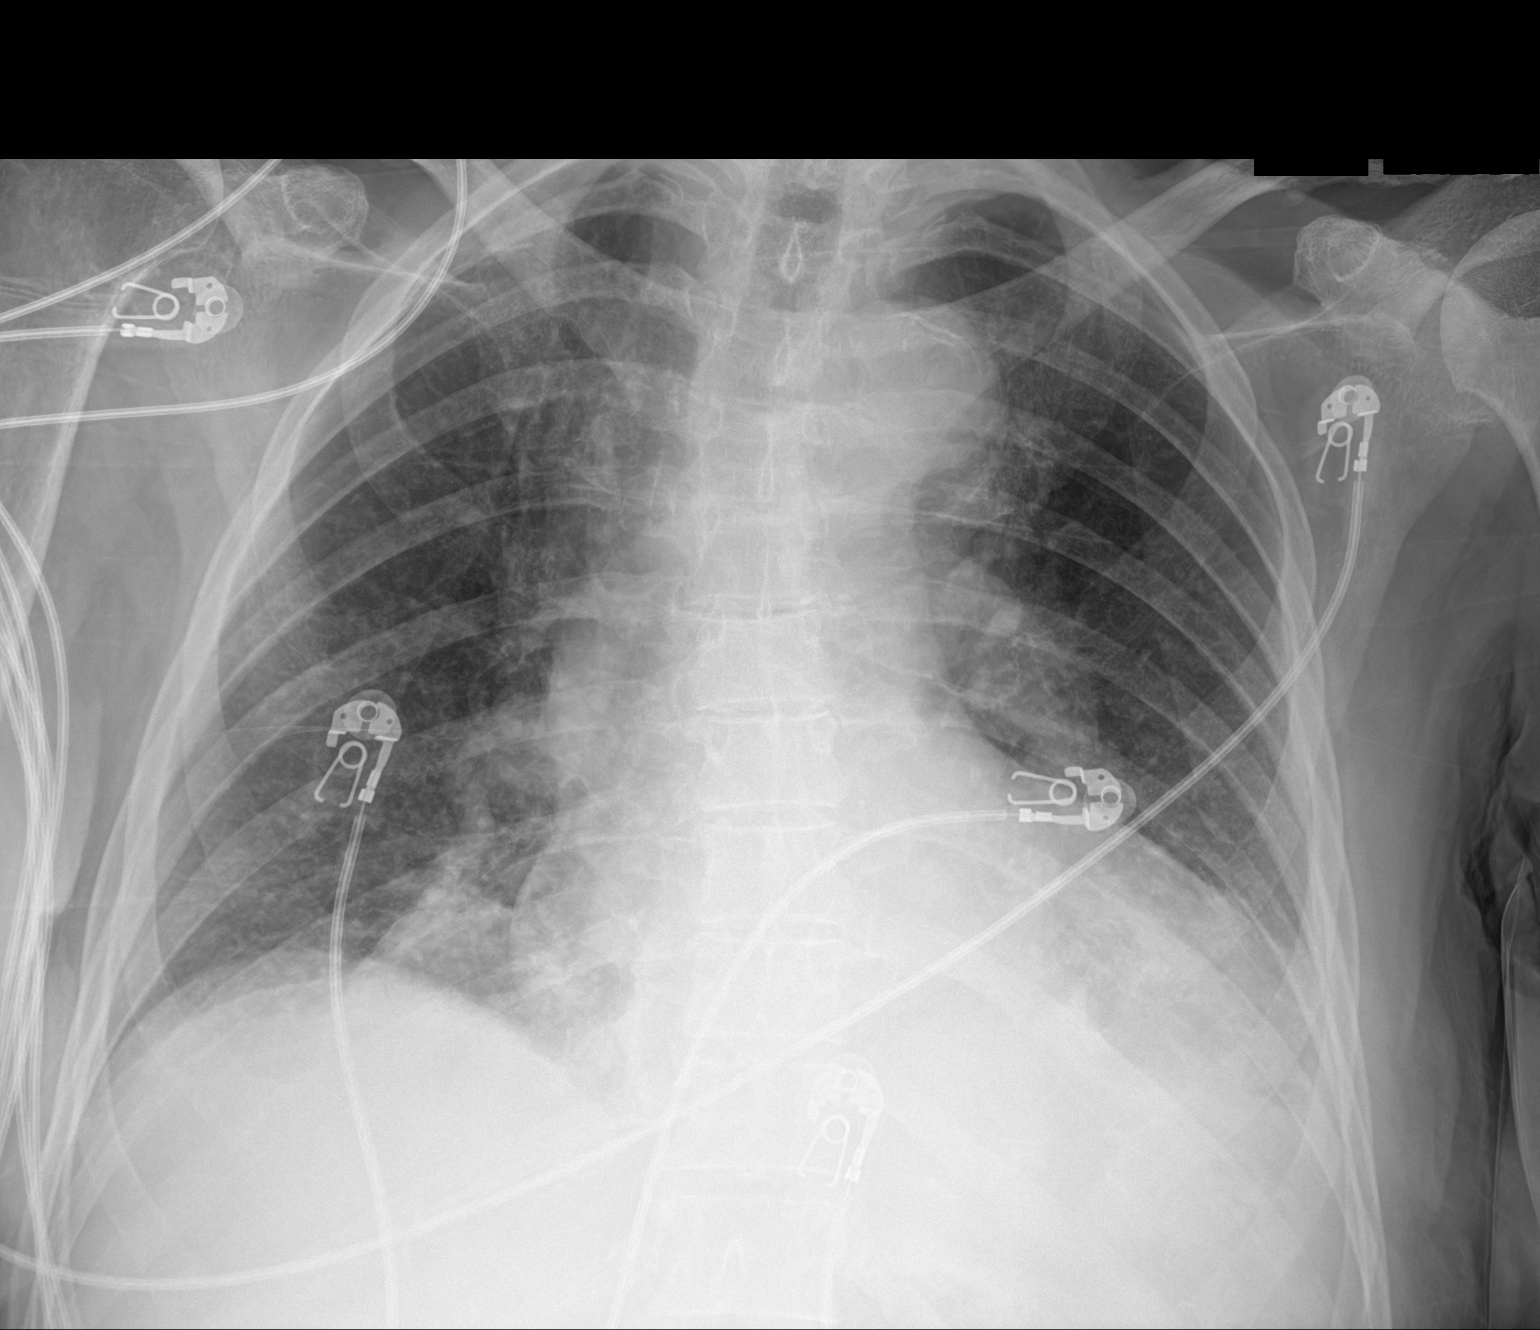

[1 of 1 positions shown; findings below may reference images not displayed]

FINDINGS: Bibasilar (left-greater-than-right) opacities indicative of
atelectasis and/or consolidation. Small left pleural effusion. No
definite right pleural effusion. No pneumothorax. No evidence of
pulmonary edema. Heart size is mildly enlarged. Upper mediastinal
contours are within normal limits. Atherosclerotic calcifications
are noted in the thoracic aorta.
IMPRESSION: 1. Bibasilar areas of atelectasis and/or consolidation.
2. Small left pleural effusion.
3. Mild cardiomegaly.
4. Aortic atherosclerosis.

## 2022-03-02 IMAGING — CT CT ABD-PELV W/ CM
2 of 5 series · 16 of 46 positions shown, 18 images · IV contrast (agent unspecified)
Comparison: None Available.

CLINICAL DATA: acute hepatitis.  Fall.

EXAM:
CT ABDOMEN AND PELVIS WITH CONTRAST
TECHNIQUE: Multidetector CT imaging of the abdomen and pelvis was performed
using the standard protocol following bolus administration of
intravenous contrast.

[Series 3: a/p w/ 5mm · axial · 0.81mm/px · z∈[+674,+1064]mm · 13 of 88 slices shown, 15 images]
[im 5/88  soft-tissue]
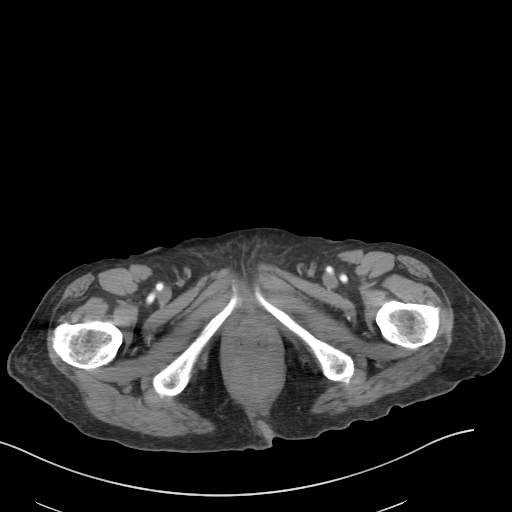
[im 5/88  bone]
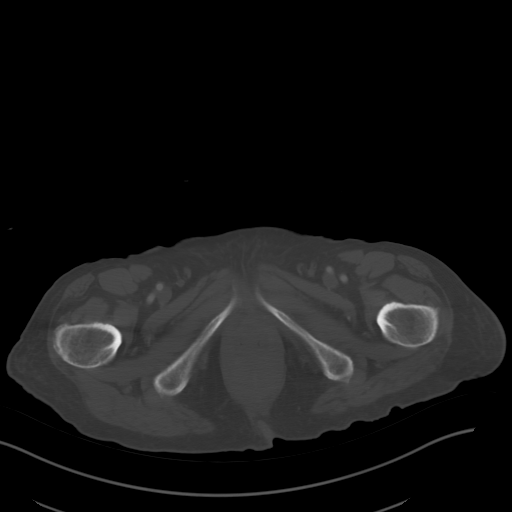
[im 14/88  soft-tissue]
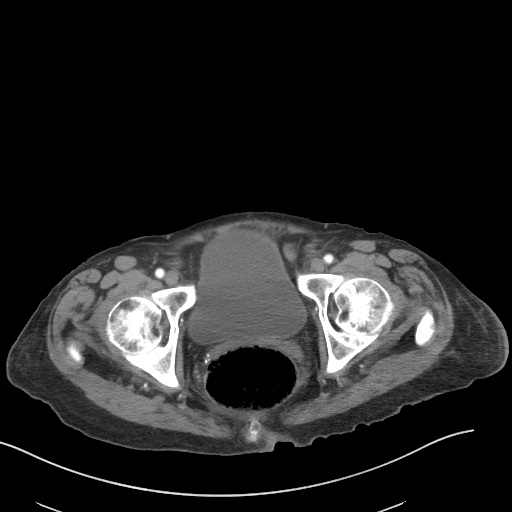
[im 18/88  soft-tissue]
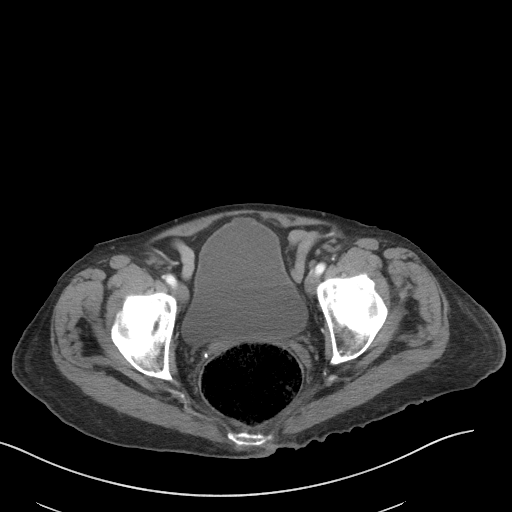
[im 27/88  soft-tissue]
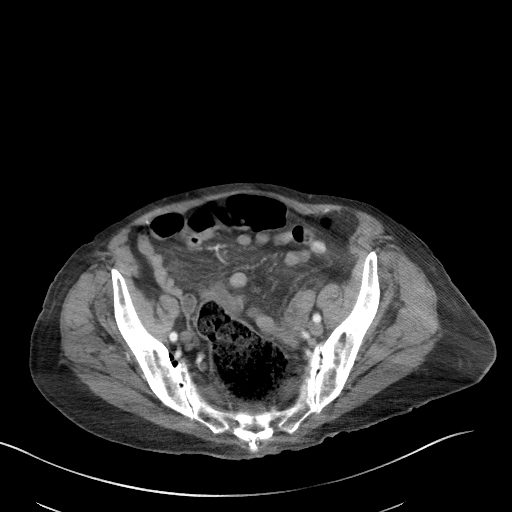
[im 31/88  soft-tissue]
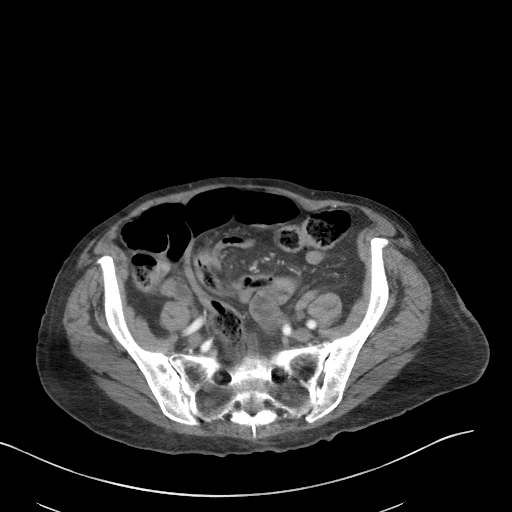
[im 40/88  soft-tissue]
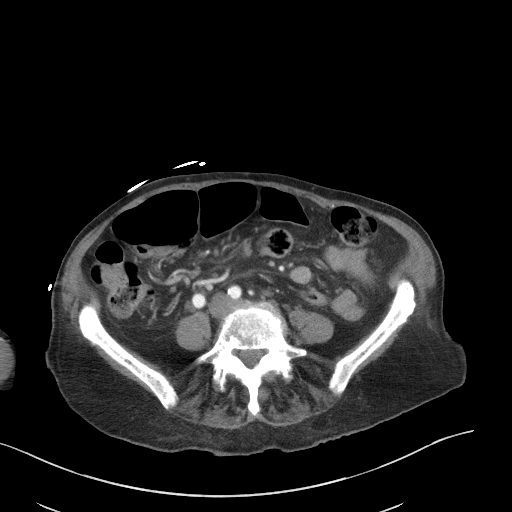
[im 44/88  soft-tissue]
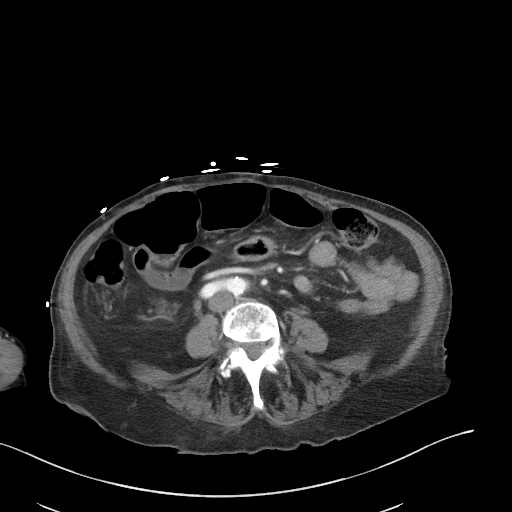
[im 48/88  soft-tissue]
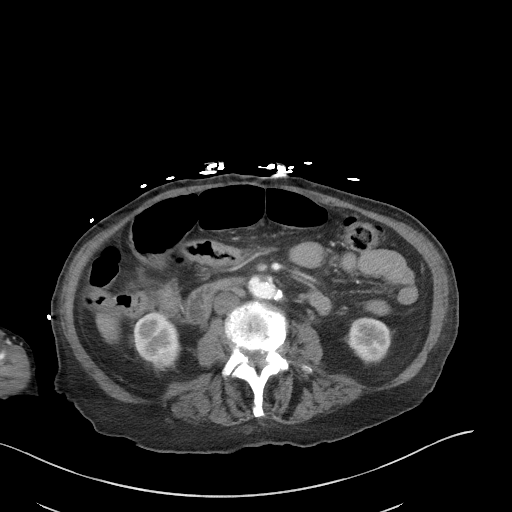
[im 57/88  soft-tissue]
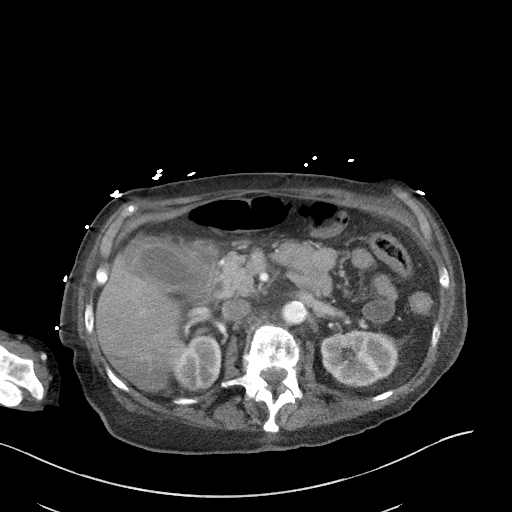
[im 57/88  bone]
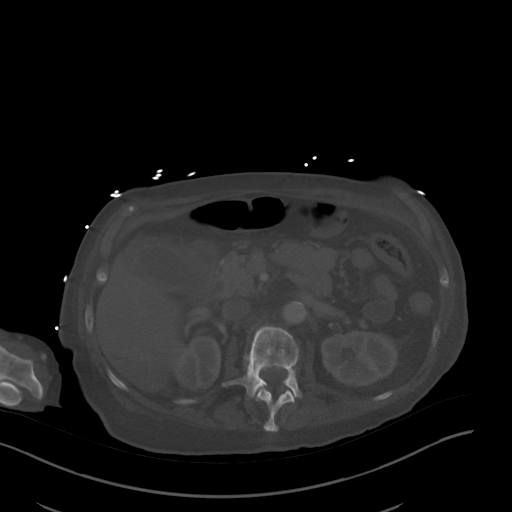
[im 61/88  soft-tissue]
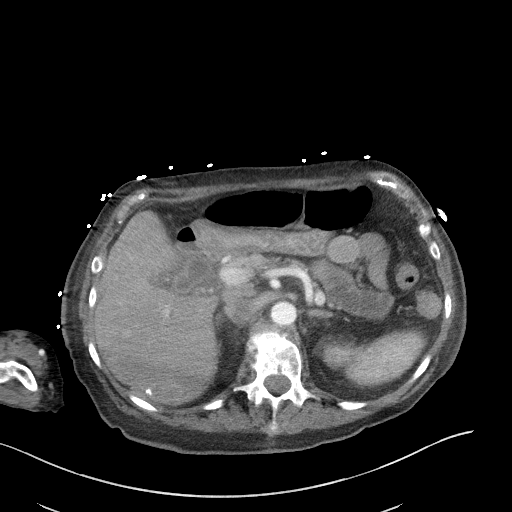
[im 70/88  soft-tissue]
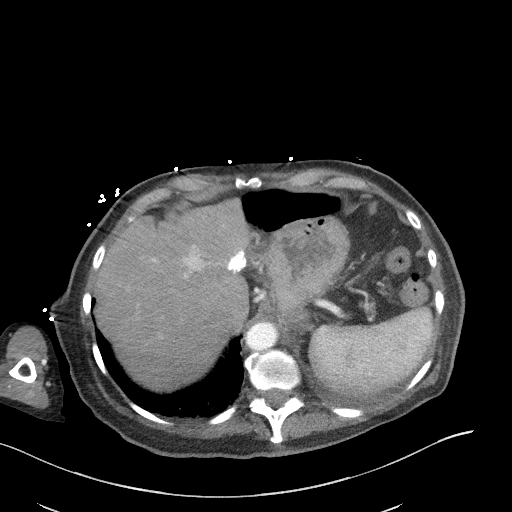
[im 74/88  soft-tissue]
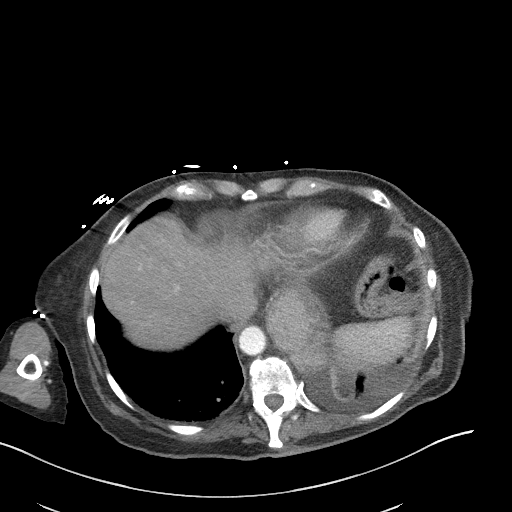
[im 83/88  soft-tissue]
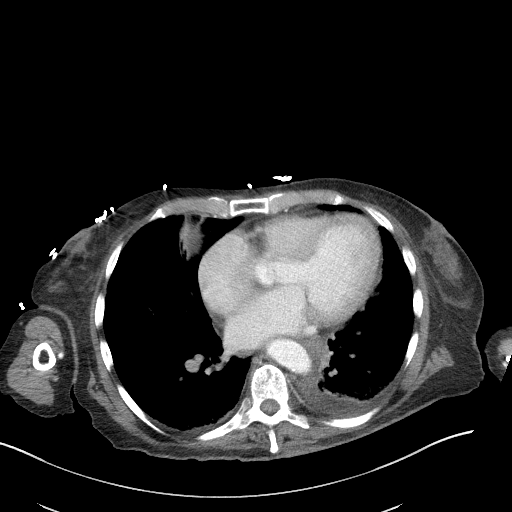

[Series 6: a/p w/ cor · coronal · 0.77mm/px · 3 of 136 slices shown]
[im 46/136  soft-tissue]
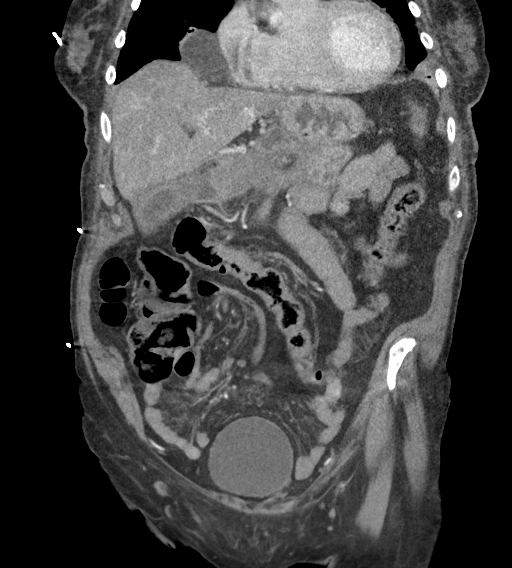
[im 61/136  soft-tissue]
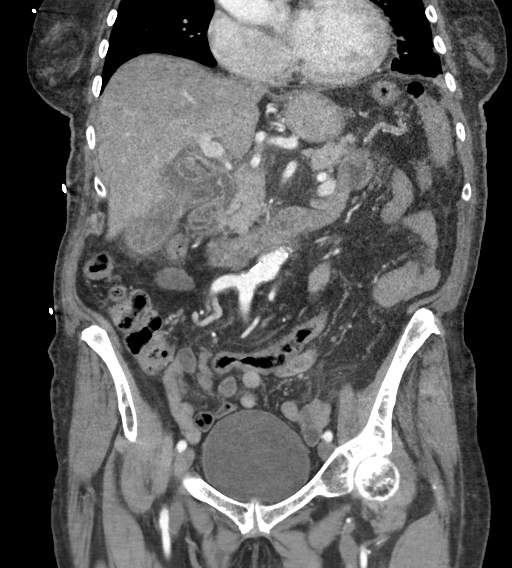
[im 76/136  soft-tissue]
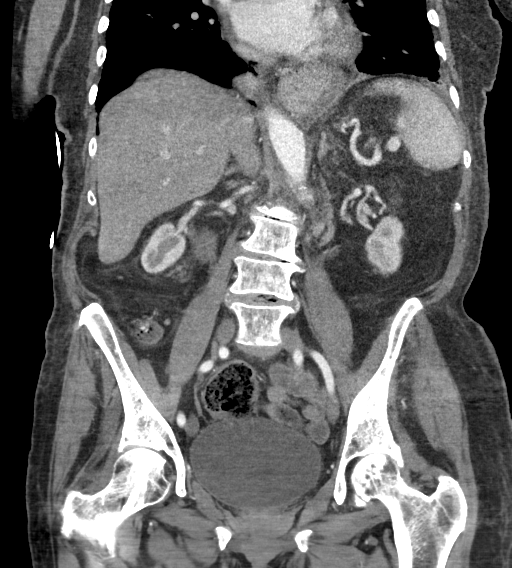

[16 of 46 positions shown; findings below may reference images not displayed]

RADIATION DOSE REDUCTION: This exam was performed according to the
departmental dose-optimization program which includes automated
exposure control, adjustment of the mA and/or kV according to
patient size and/or use of iterative reconstruction technique.

CONTRAST:  65mL OMNIPAQUE IOHEXOL 350 MG/ML SOLN
FINDINGS: Lower chest: Lingular scarring or atelectasis. Dependent left lower
lobe airspace disease. Small left pleural effusion. Trace right
pleural fluid. Mild cardiomegaly. Small hiatal hernia. Presumed
pericardial cyst of 4.6 cm in the right cardiophrenic angle.

Hepatobiliary: Nonspecific caudate lobe enlargement. Hepatic
calcifications which are likely related to prior infection or
inflammation. Probable hepatic steatosis.

Subtle gallstones within the fundus. Gallbladder wall thickening and
pericholecystic edema including on [DATE]. No biliary duct dilatation.

Pancreas: Normal pancreas for age.

Spleen: Subcentimeter hypoattenuating splenic lesions are
nonspecific but of doubtful clinical significance. No splenomegaly.

Adrenals/Urinary Tract: Normal adrenal glands. Too small to
characterize interpolar left renal lesion. Normal right kidney for
age. No hydronephrosis. Normal urinary bladder.

Stomach/Bowel: Gastric body underdistention. Rectal stool ball of
8.7 cm. Normal small bowel.

Vascular/Lymphatic: Advanced aortic and branch vessel
atherosclerosis. No abdominopelvic adenopathy.

Reproductive: Hysterectomy.  No adnexal mass.

Other: No significant free fluid.  No free intraperitoneal air.

Musculoskeletal: Lumbar spondylosis with S-shaped thoracolumbar
spine curvature.
IMPRESSION: 1. No posttraumatic deformity identified.
2. Findings highly suspicious for acute cholecystitis.
3. Rectal stool ball suggests constipation or fecal impaction.
4. Small hiatal hernia. Gastric body apparent wall thickening is at
least partially felt to be due to underdistention. Correlate with
symptoms of gastritis.
5. Small left and trace right pleural effusions with dependent left
base airspace disease. Suspicious for mild pneumonia or aspiration
with compressive atelectasis felt less likely.
6. Probable hepatic steatosis.

## 2022-03-02 IMAGING — CT CT CERVICAL SPINE W/O CM
3 of 4 series · 11 of 33 positions shown, 13 images · non-contrast
Comparison: [DATE]

CLINICAL DATA: Neck trauma (Age >= 65y); Head trauma, minor (Age >=
65y). History of dens fracture.



[Series 5: c_spine 2.0 st · axial · 0.35mm/px · z∈[-243,-119]mm · 3 of 93 slices shown, 4 images]
[im 16/93  soft-tissue]
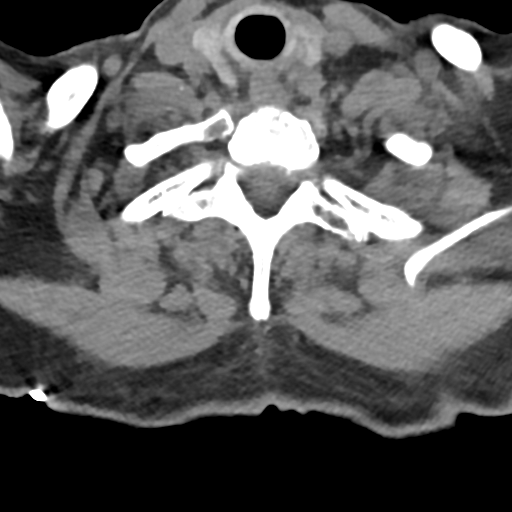
[im 16/93  bone]
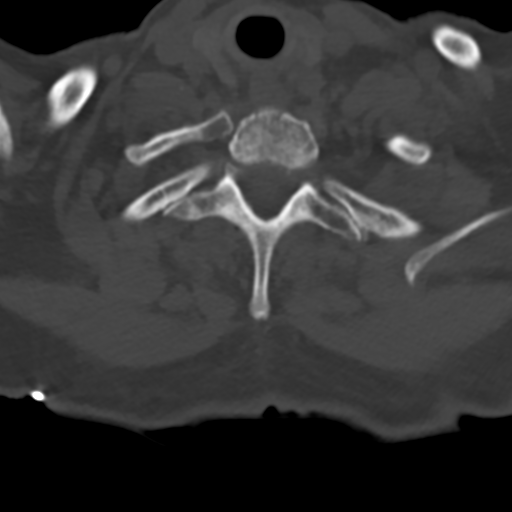
[im 47/93  bone]
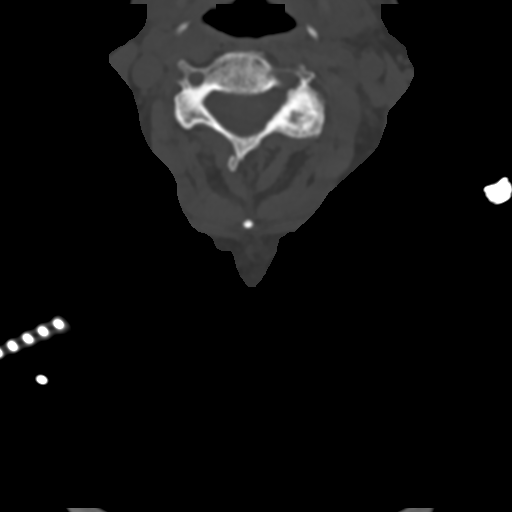
[im 77/93  bone]
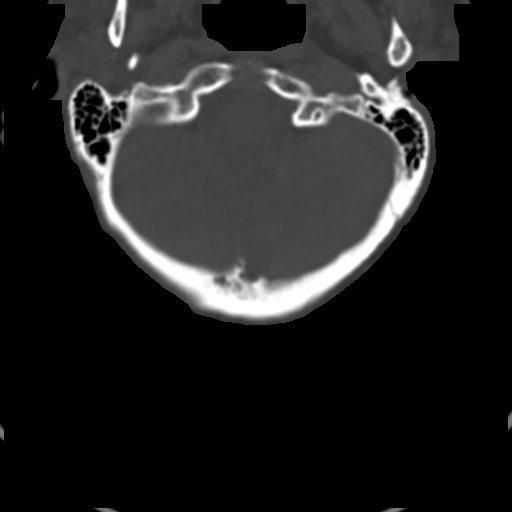

[Series 8: coronal bone · coronal · 0.23mm/px · 3 of 61 slices shown]
[im 13/61  bone]
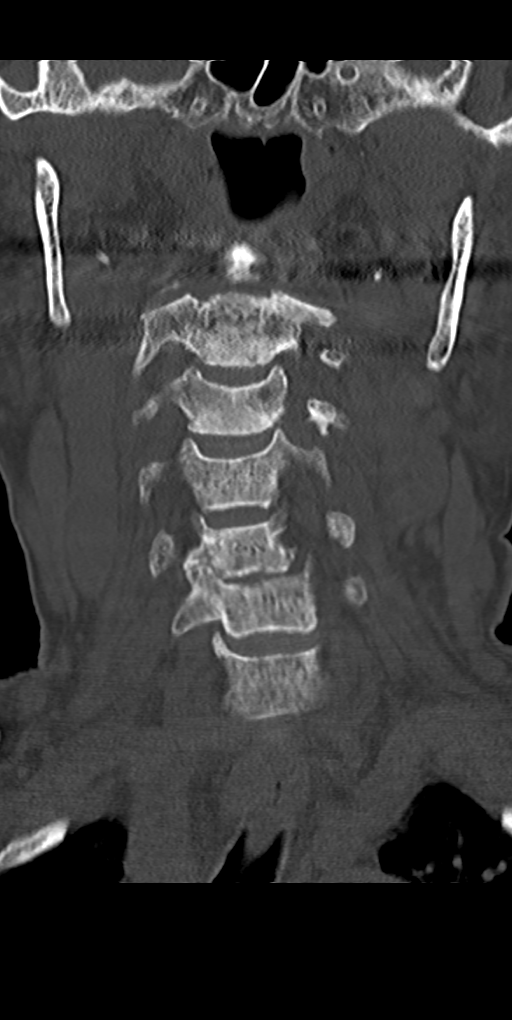
[im 25/61  bone]
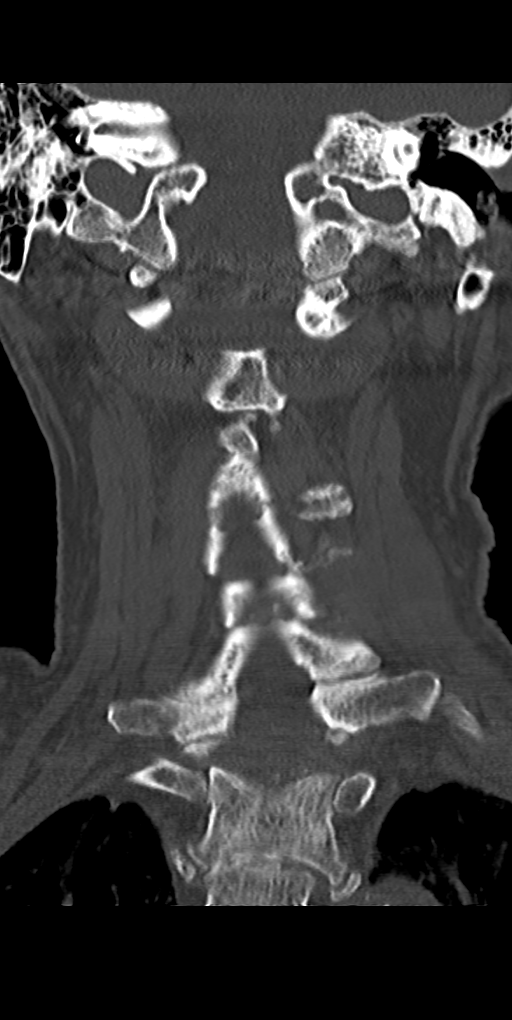
[im 37/61  bone]
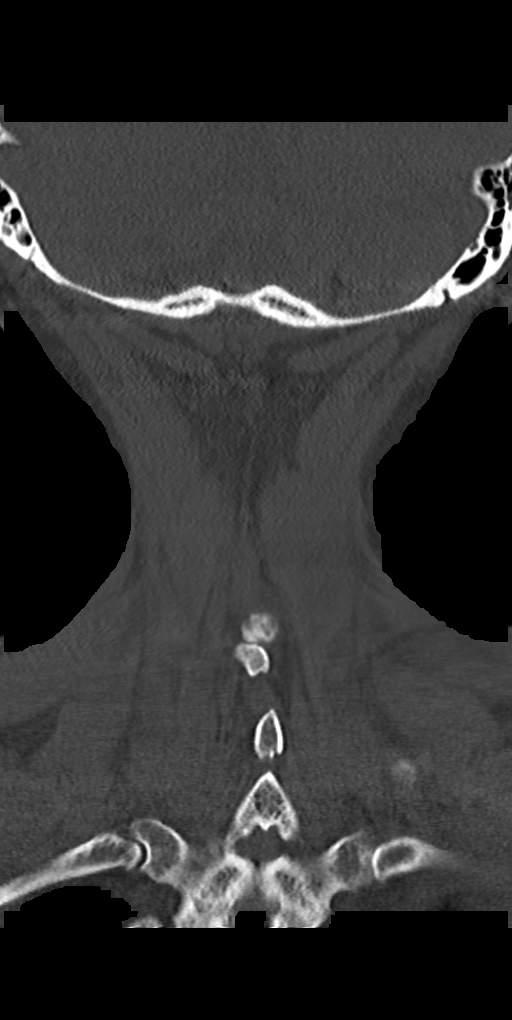

[Series 9: sagittal bone · sagittal · 0.29mm/px · 5 of 61 slices shown, 6 images]
[im 21/61  bone]
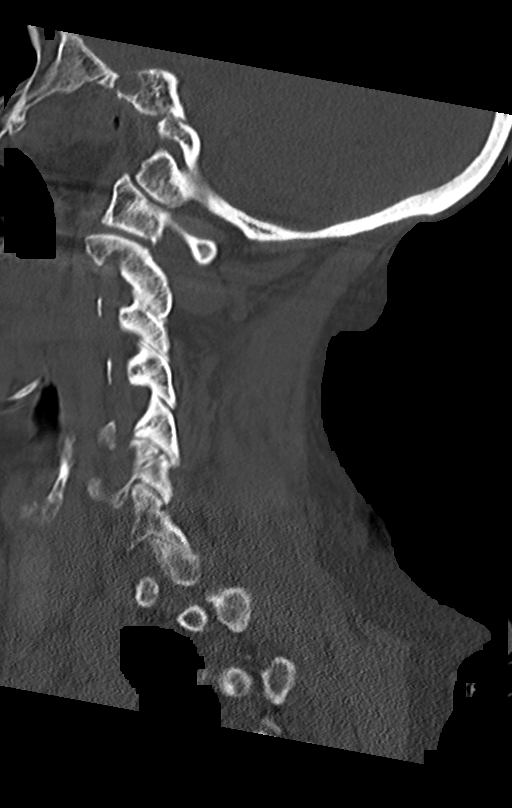
[im 26/61  bone]
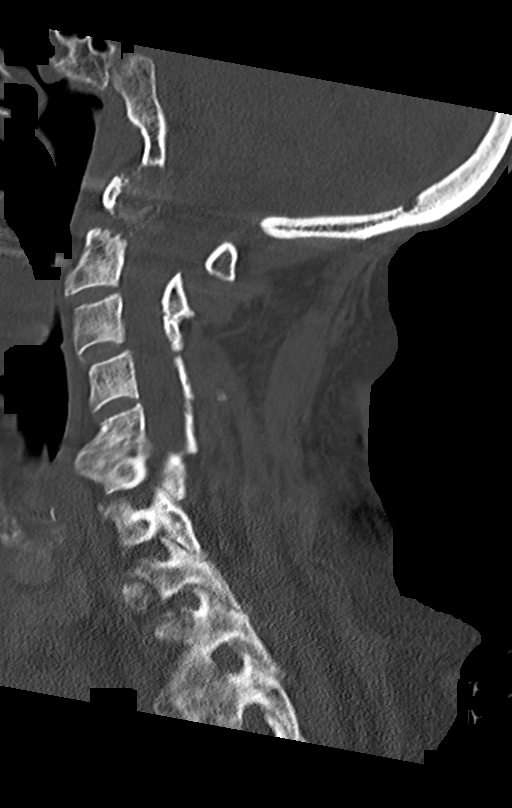
[im 31/61  soft-tissue]
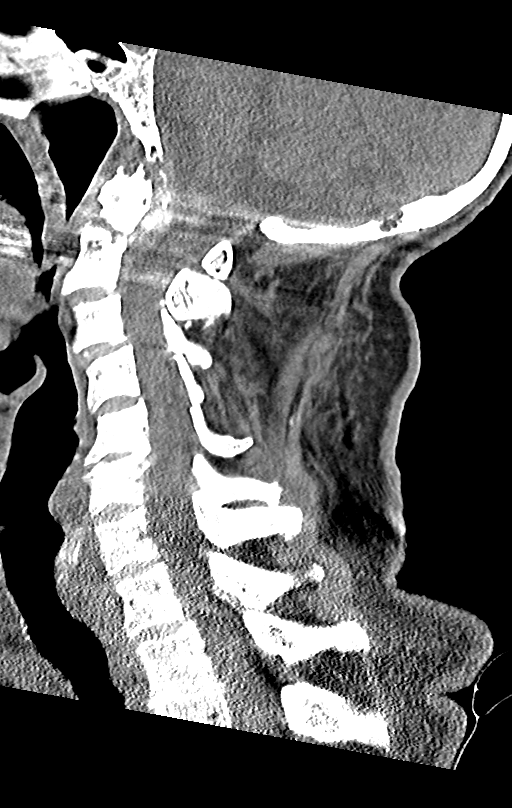
[im 31/61  bone]
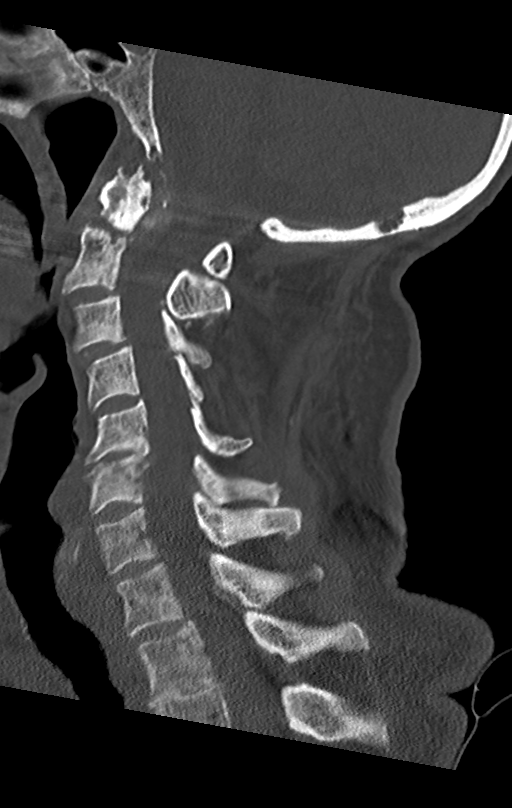
[im 36/61  bone]
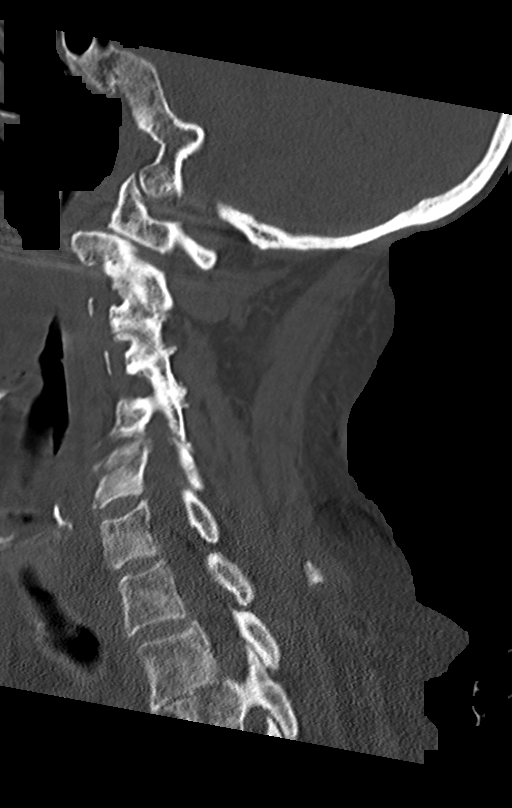
[im 41/61  bone]
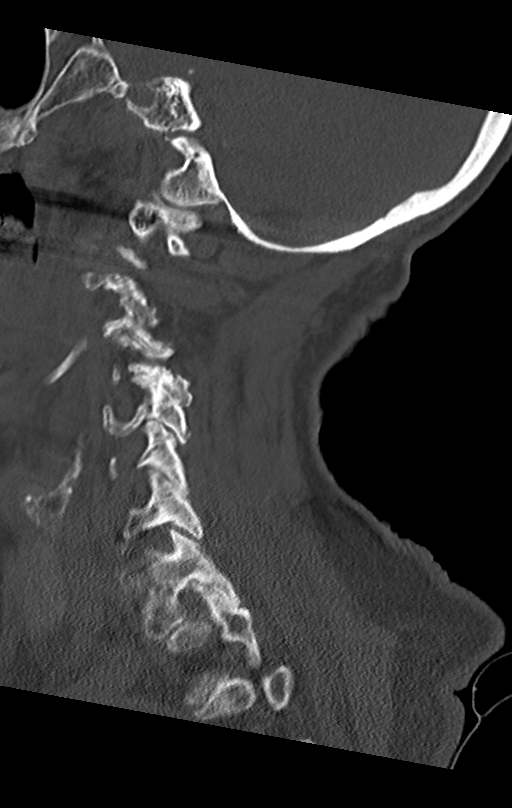

[11 of 33 positions shown; findings below may reference images not displayed]

FINDINGS: CT HEAD FINDINGS

Brain: No evidence of acute infarction, hemorrhage, hydrocephalus,
extra-axial collection or mass lesion/mass effect. Scattered
low-density changes within the periventricular and subcortical white
matter compatible with chronic microvascular ischemic change. Mild
diffuse cerebral volume loss.

Vascular: No hyperdense vessel or unexpected calcification.

Skull: Normal. Negative for fracture or focal lesion.

Sinuses/Orbits: No acute finding.

Other: Negative for scalp hematoma.

CT CERVICAL SPINE FINDINGS

Alignment: Unchanged mild posterior subluxation of C1 relative to
C2. Unchanged grade 1 anterolisthesis of C3 on C4. No facet
dislocation.

Skull base and vertebrae: Chronic ununited type 2 dens fracture. The
dens remains mildly displaced posteriorly relative to the body of
C2, unchanged. No new or acute fractures of the cervical spine. No
focal lytic or sclerotic bone lesion.

Soft tissues and spinal canal: No prevertebral fluid or swelling. No
visible canal hematoma.

Disc levels: Degenerative disc disease most pronounced at C5-6.
Multilevel bilateral facet arthropathy, left worse than right.
Progressive arthropathy at the C1-2 articulation.

Upper chest: Negative.

Other: None.
IMPRESSION: 1. No acute intracranial abnormality.
2. No acute cervical spine fracture.
3. Chronic ununited type 2 dens fracture, unchanged from prior.
4. Chronic microvascular ischemic change and cerebral volume loss.

## 2022-03-02 IMAGING — CT CT HEAD W/O CM
3 series · 14 of 47 positions shown, 16 images · non-contrast
Comparison: [DATE]

CLINICAL DATA: Neck trauma (Age >= 65y); Head trauma, minor (Age >=
65y). History of dens fracture.



[Series 3: head 5.0 h30s · axial · 0.47mm/px · z∈[-112,+23]mm · 8 of 33 slices shown, 10 images]
[im 3/33  brain]
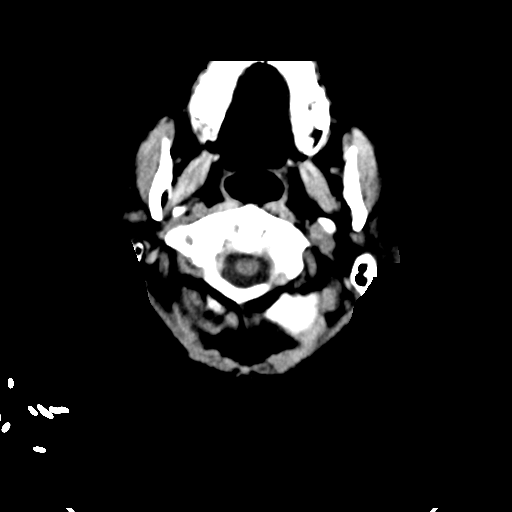
[im 3/33  bone]
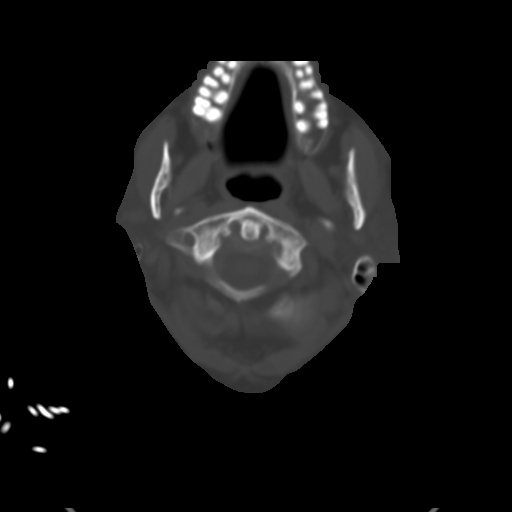
[im 7/33  brain]
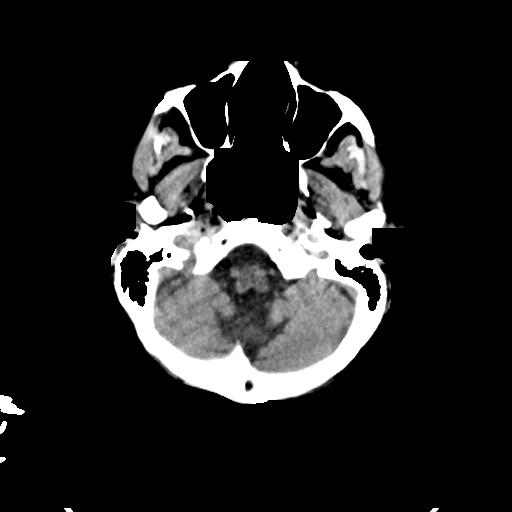
[im 10/33  brain]
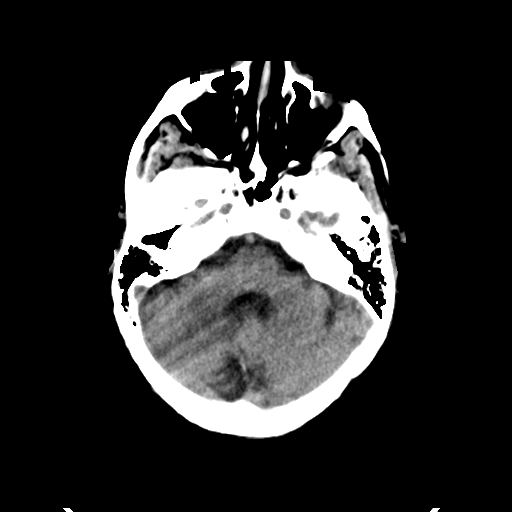
[im 15/33  brain]
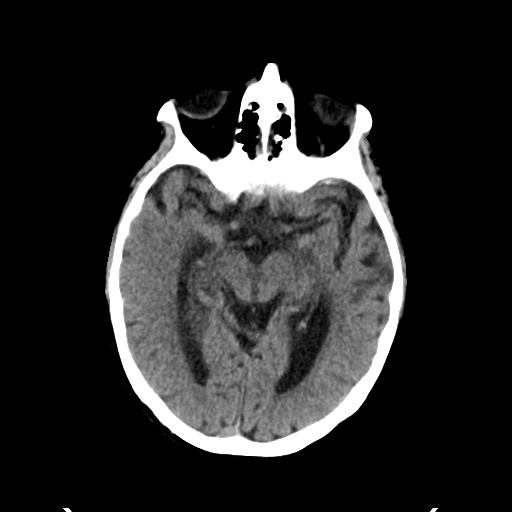
[im 18/33  brain]
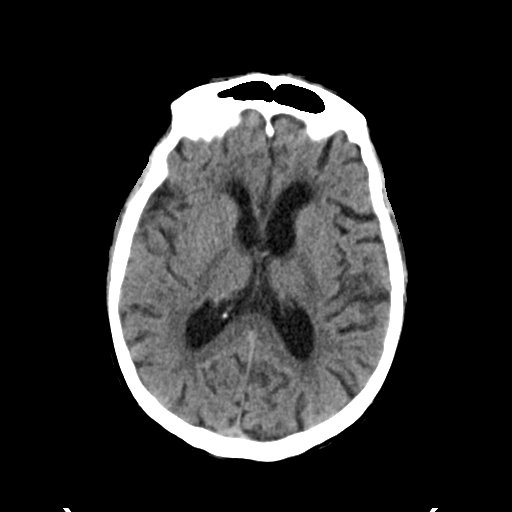
[im 18/33  bone]
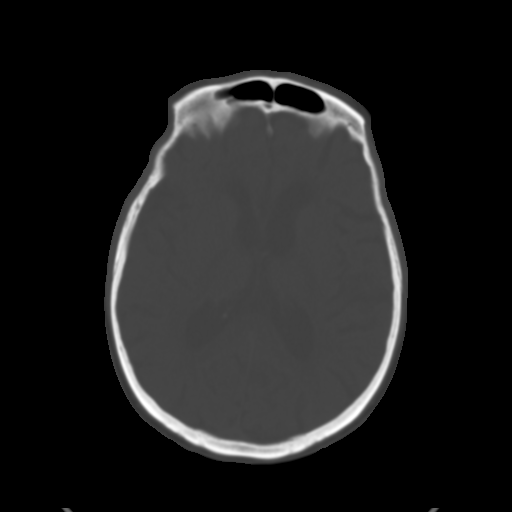
[im 23/33  brain]
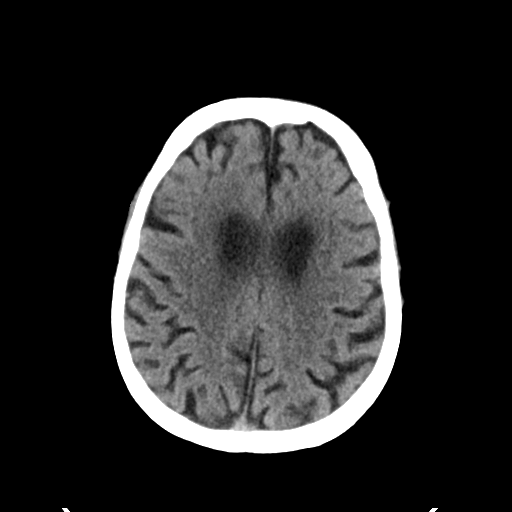
[im 26/33  brain]
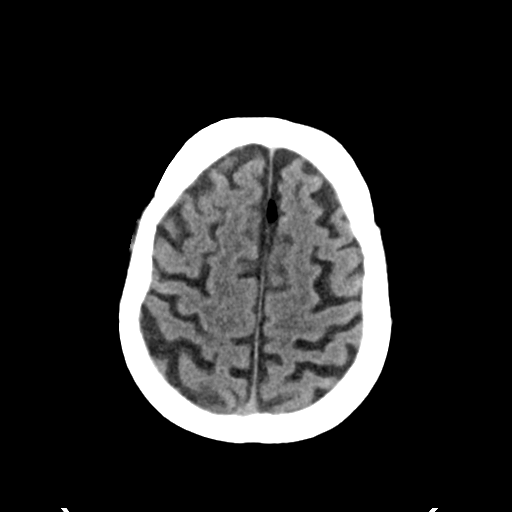
[im 30/33  brain]
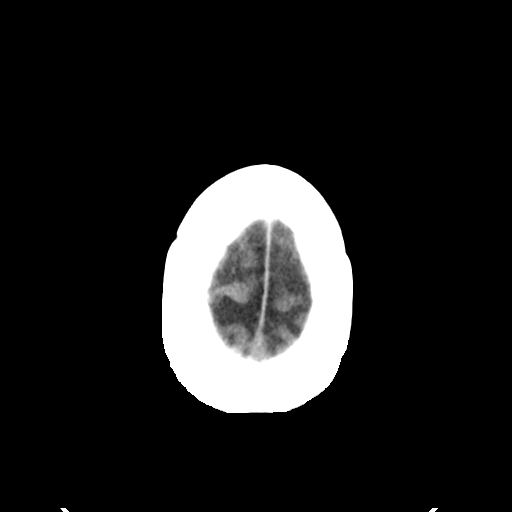

[Series 5: head 3.0 mpr cor · coronal · 0.32mm/px · 3 of 67 slices shown]
[im 23/67  brain]
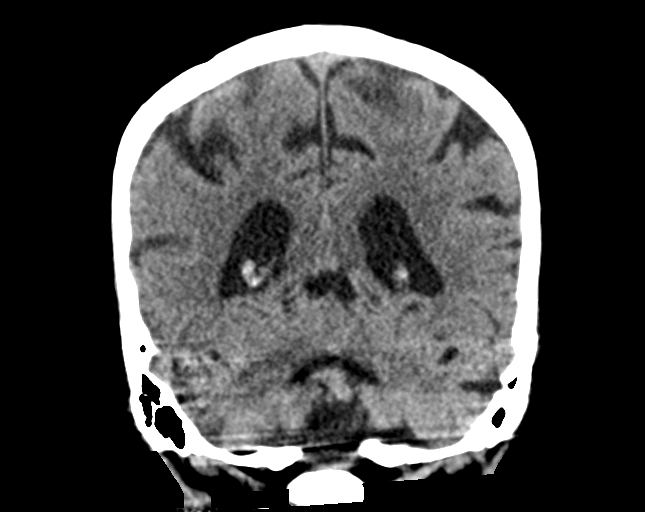
[im 30/67  brain]
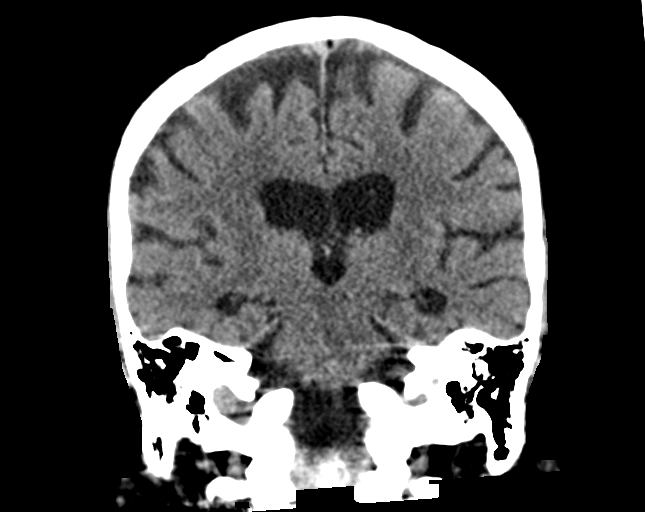
[im 37/67  brain]
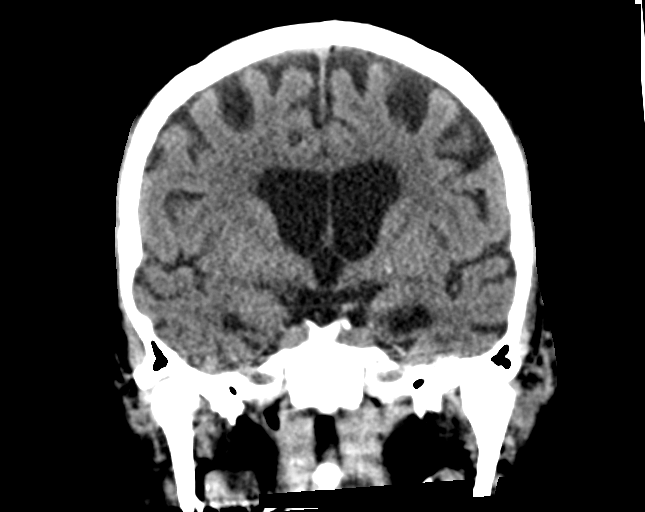

[Series 6: head 3.0 mpr sag · sagittal · 0.32mm/px · 3 of 65 slices shown]
[im 22/65  brain]
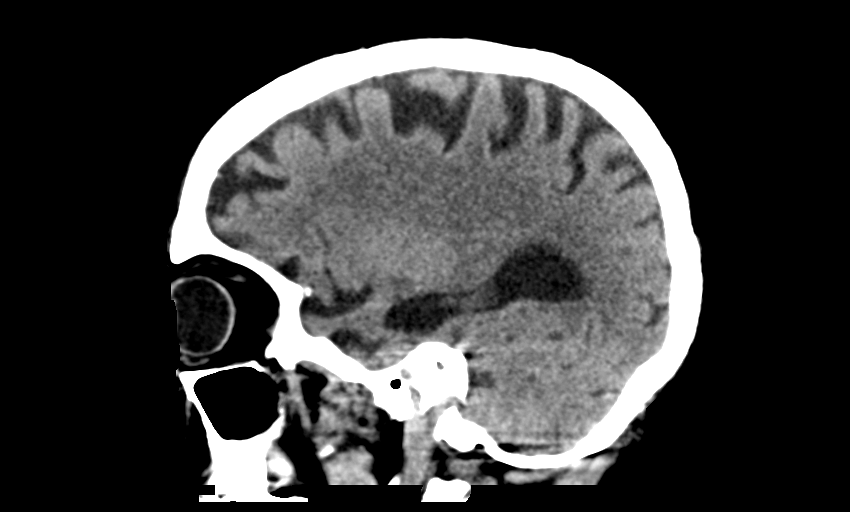
[im 33/65  brain]
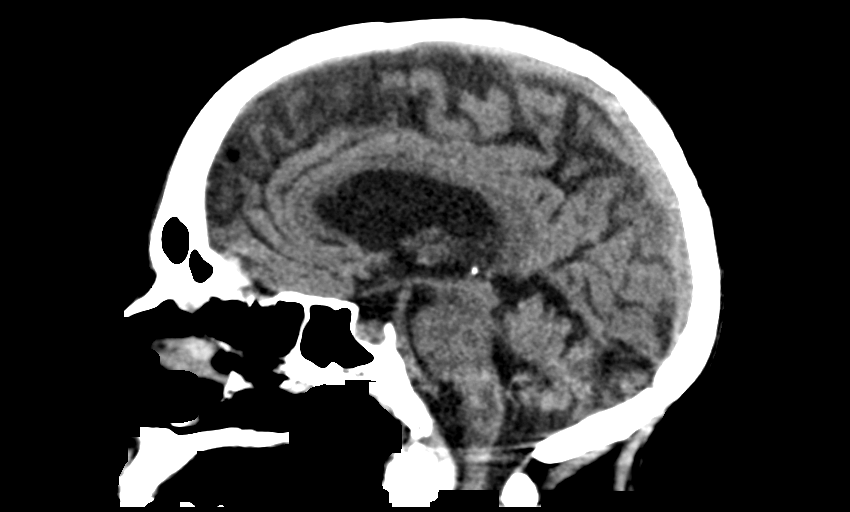
[im 43/65  brain]
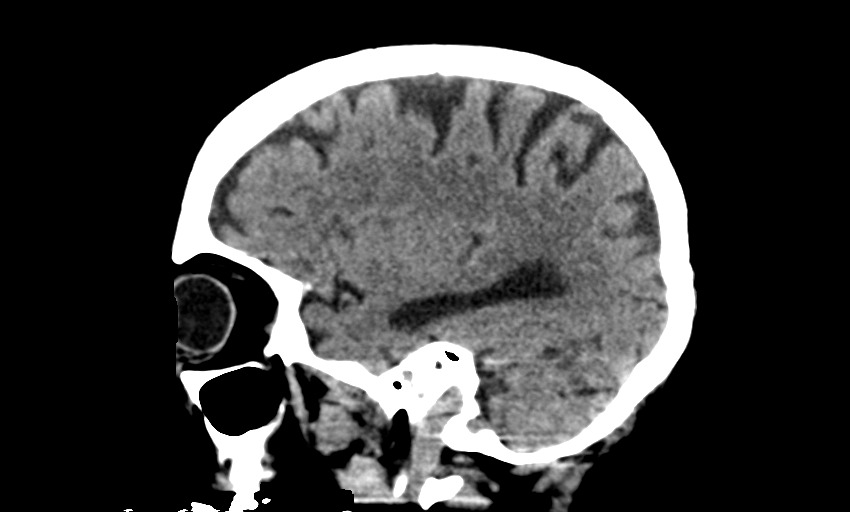

[14 of 47 positions shown; findings below may reference images not displayed]

FINDINGS: CT HEAD FINDINGS

Brain: No evidence of acute infarction, hemorrhage, hydrocephalus,
extra-axial collection or mass lesion/mass effect. Scattered
low-density changes within the periventricular and subcortical white
matter compatible with chronic microvascular ischemic change. Mild
diffuse cerebral volume loss.

Vascular: No hyperdense vessel or unexpected calcification.

Skull: Normal. Negative for fracture or focal lesion.

Sinuses/Orbits: No acute finding.

Other: Negative for scalp hematoma.

CT CERVICAL SPINE FINDINGS

Alignment: Unchanged mild posterior subluxation of C1 relative to
C2. Unchanged grade 1 anterolisthesis of C3 on C4. No facet
dislocation.

Skull base and vertebrae: Chronic ununited type 2 dens fracture. The
dens remains mildly displaced posteriorly relative to the body of
C2, unchanged. No new or acute fractures of the cervical spine. No
focal lytic or sclerotic bone lesion.

Soft tissues and spinal canal: No prevertebral fluid or swelling. No
visible canal hematoma.

Disc levels: Degenerative disc disease most pronounced at C5-6.
Multilevel bilateral facet arthropathy, left worse than right.
Progressive arthropathy at the C1-2 articulation.

Upper chest: Negative.

Other: None.
IMPRESSION: 1. No acute intracranial abnormality.
2. No acute cervical spine fracture.
3. Chronic ununited type 2 dens fracture, unchanged from prior.
4. Chronic microvascular ischemic change and cerebral volume loss.

## 2022-03-02 IMAGING — US US ABDOMEN LIMITED
1 series · 14 of 25 positions shown · non-contrast
Comparison: CT abdomen and pelvis [DATE]

CLINICAL DATA: Right upper quadrant pain.

EXAM:
ULTRASOUND ABDOMEN LIMITED RIGHT UPPER QUADRANT

[Series 1: us abdomen limited ruq (liver/gb) · 14 of 57 slices shown]
[im 1/57]
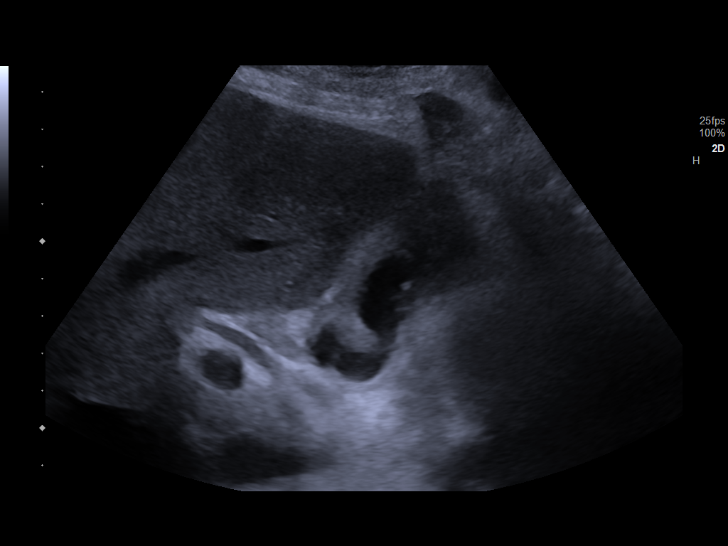
[im 5/57]
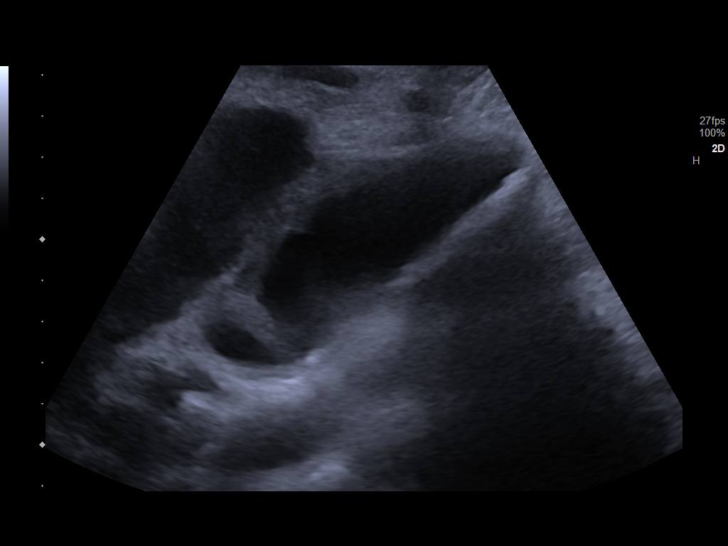
[im 10/57]
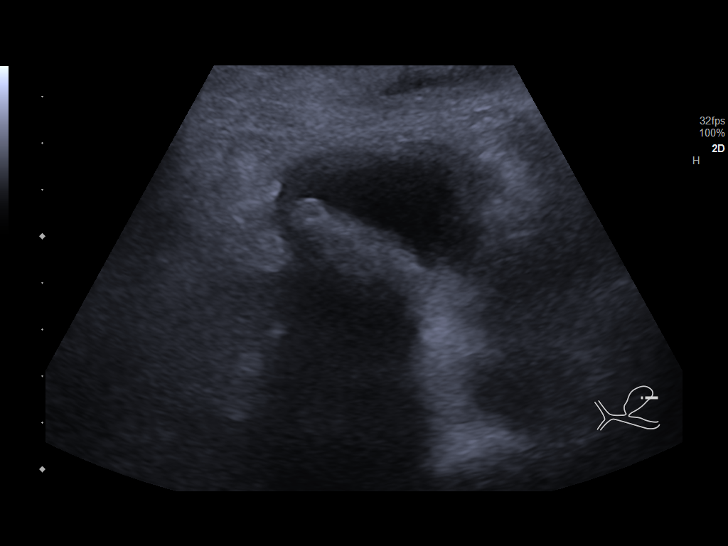
[im 15/57]
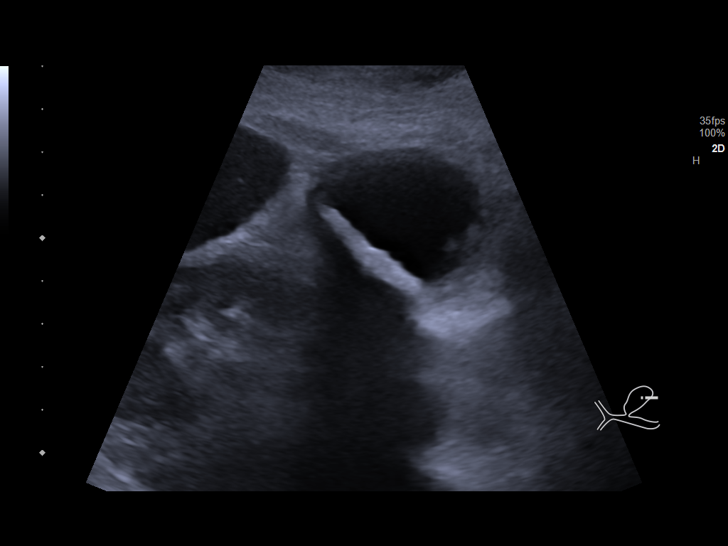
[im 19/57]
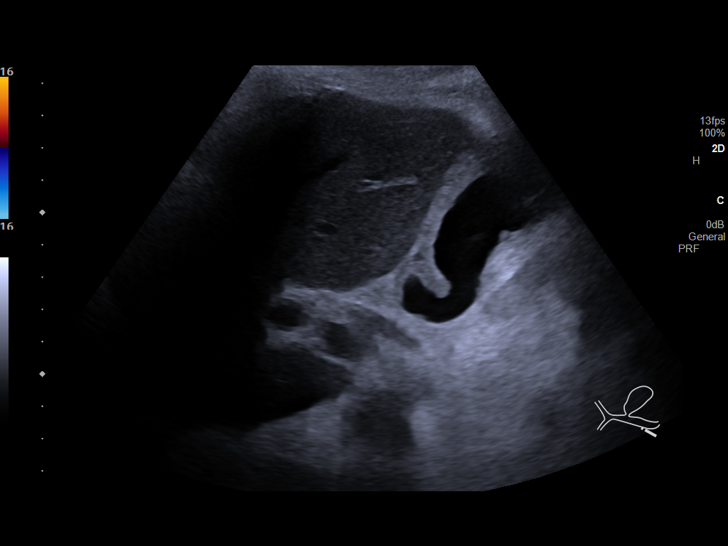
[im 22/57]
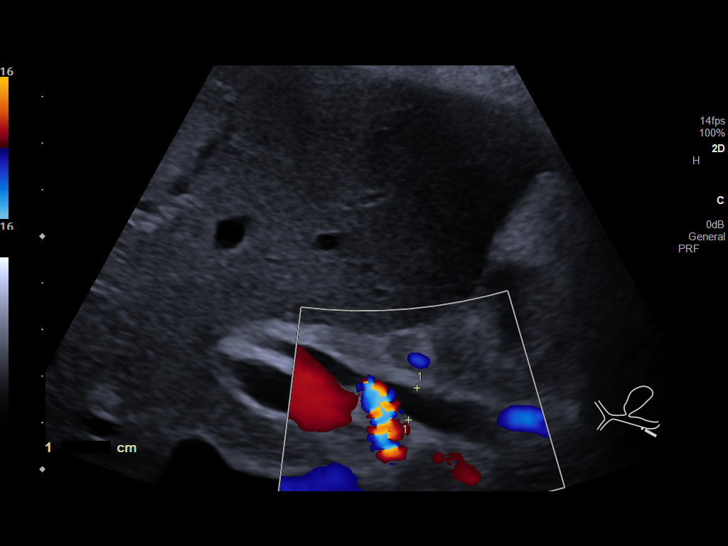
[im 26/57]
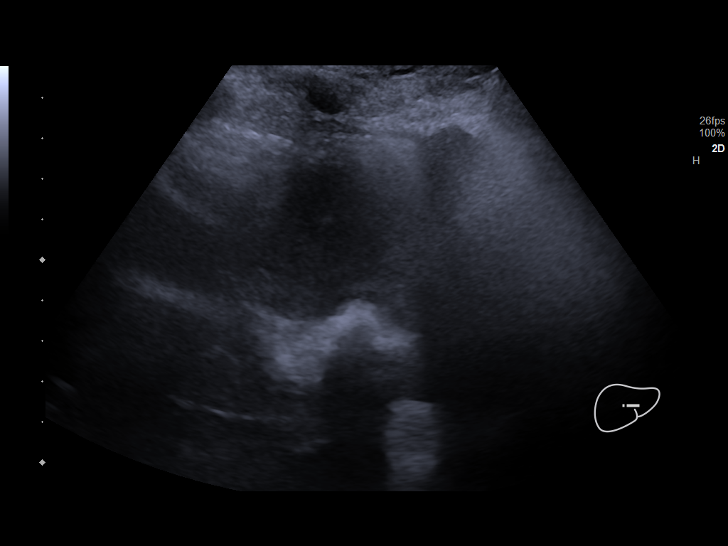
[im 31/57]
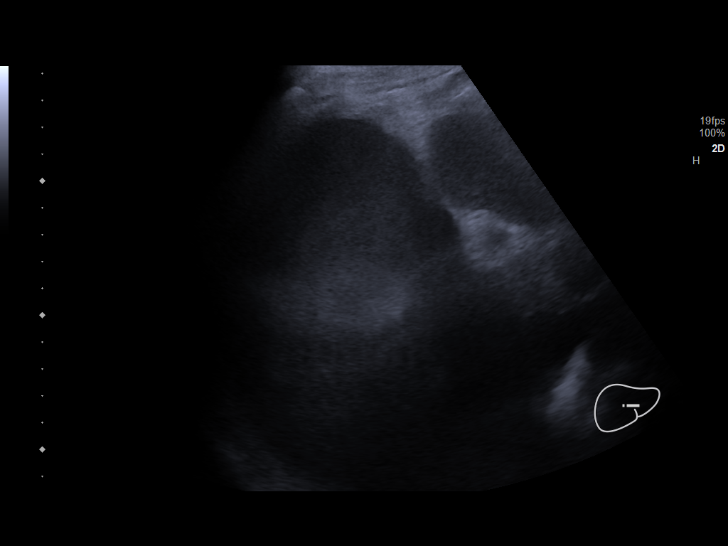
[im 36/57]
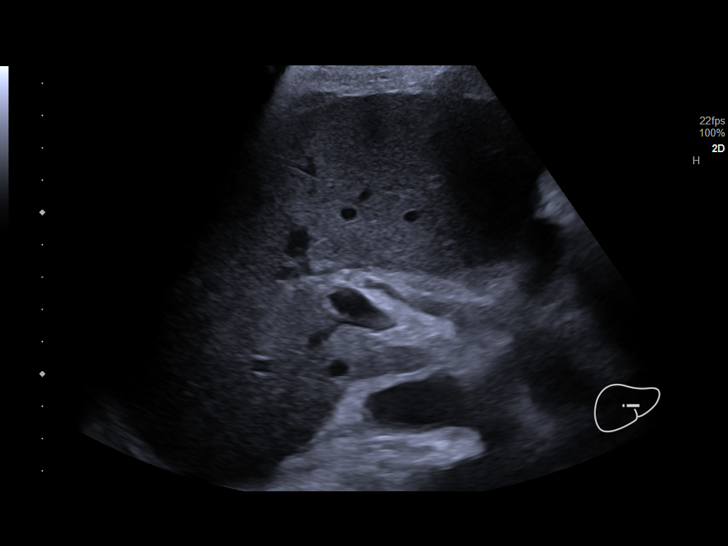
[im 38/57]
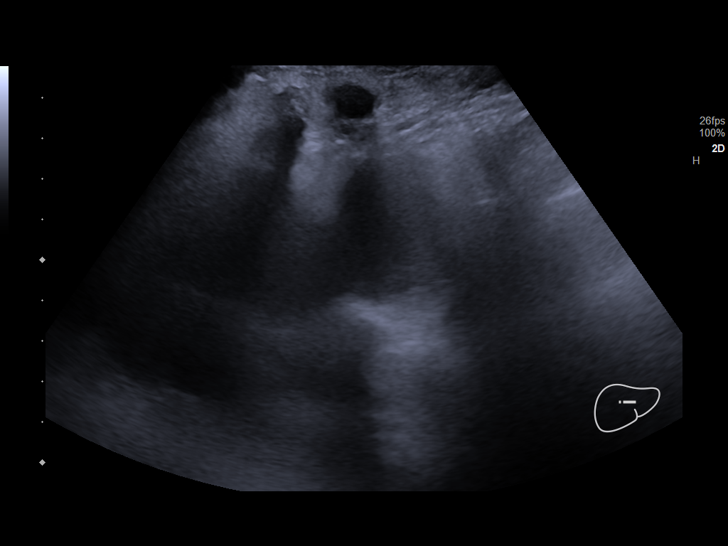
[im 43/57]
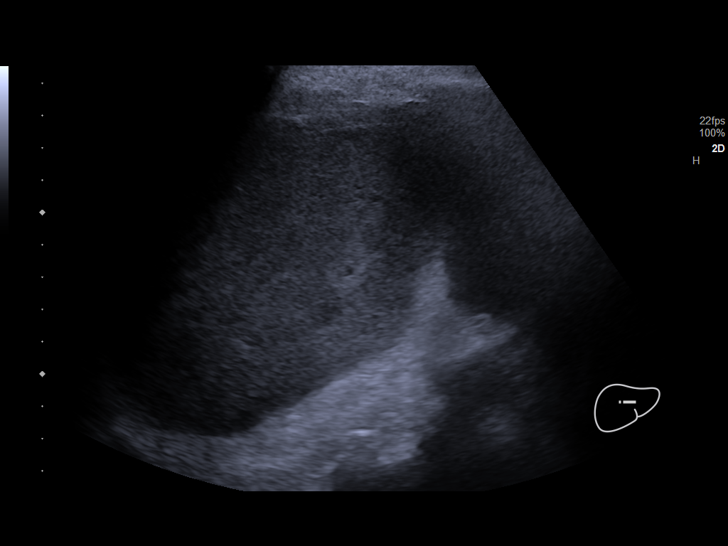
[im 47/57]
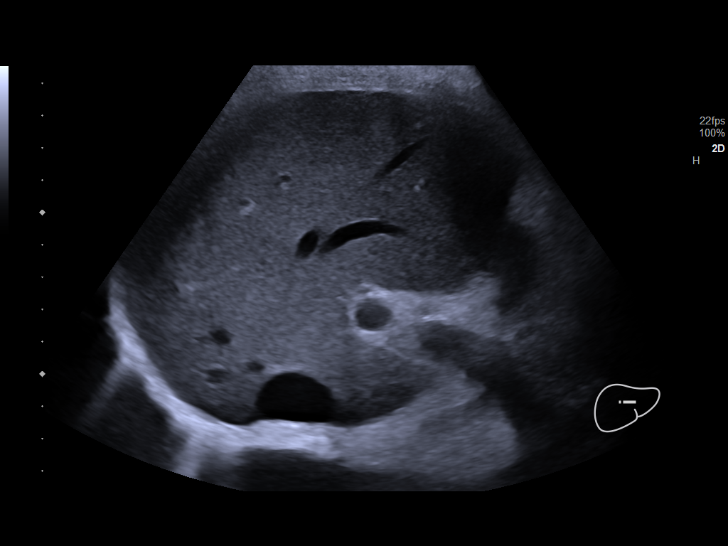
[im 52/57]
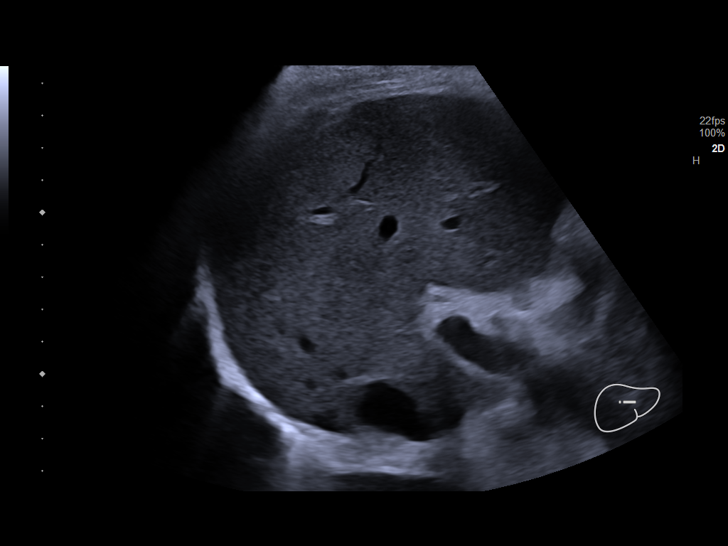
[im 57/57]
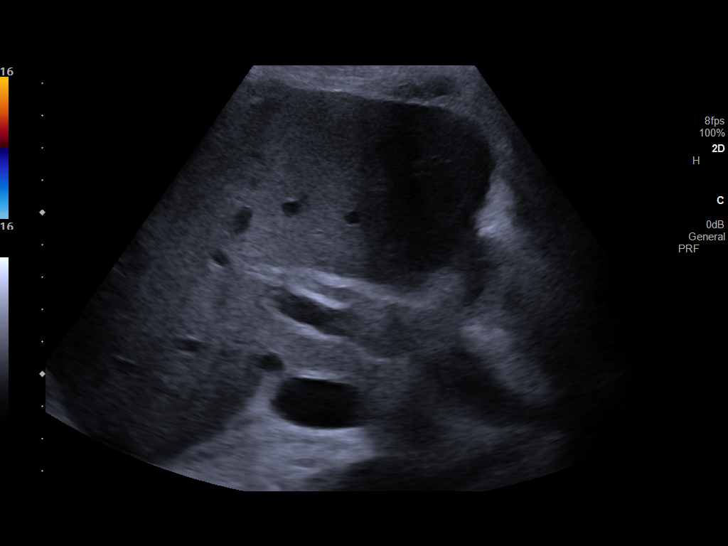

[14 of 25 positions shown; findings below may reference images not displayed]

FINDINGS: Gallbladder:

Gallstones are present. Multiple small layering gallstones are
present measuring up to 3 mm. There is gallbladder wall thickening
measuring 4 mm. Sonographic Murphy sign is negative.

Common bile duct:

Diameter: 7 mm.  (Within normal limits).

Liver:

There is a 10 x 8 x 8 mm hypoechoic mass in the right lobe of the
liver without vascularity. Within normal limits in parenchymal
echogenicity. Portal vein is patent on color Doppler imaging with
normal direction of blood flow towards the liver.

Other: None.
IMPRESSION: 1. Cholelithiasis with gallbladder wall thickening. Negative
sonographic Murphy sign. No biliary ductal dilatation. Findings may
related to acute or chronic cholecystitis. Correlate clinically.
2. 1 cm hypoechoic mass in the right lobe of the liver favored as
hemangioma.

## 2022-03-02 MED ORDER — SODIUM CHLORIDE 0.9 % IV SOLN
2.0000 g | Freq: Once | INTRAVENOUS | Status: AC
  Administered 2022-03-02: 2 g via INTRAVENOUS
  Filled 2022-03-02: qty 20

## 2022-03-02 MED ORDER — ONDANSETRON HCL 4 MG/2ML IJ SOLN: 4.0000 mg | Freq: Four times a day (QID) | INTRAMUSCULAR | Status: DC | PRN

## 2022-03-02 MED ORDER — MELATONIN 5 MG PO TABS
10.0000 mg | ORAL_TABLET | Freq: Every day | ORAL | Status: DC
  Administered 2022-03-02 – 2022-03-04 (×3): 10 mg via ORAL
  Filled 2022-03-02 (×3): qty 2

## 2022-03-02 MED ORDER — PANTOPRAZOLE SODIUM 40 MG PO TBEC
40.0000 mg | DELAYED_RELEASE_TABLET | Freq: Every day | ORAL | Status: DC
  Administered 2022-03-02 – 2022-03-05 (×4): 40 mg via ORAL
  Filled 2022-03-02 (×4): qty 1

## 2022-03-02 MED ORDER — ENOXAPARIN SODIUM 30 MG/0.3ML IJ SOSY
30.0000 mg | PREFILLED_SYRINGE | INTRAMUSCULAR | Status: DC
  Administered 2022-03-02 – 2022-03-04 (×3): 30 mg via SUBCUTANEOUS
  Filled 2022-03-02 (×2): qty 0.3

## 2022-03-02 MED ORDER — ACETAMINOPHEN 650 MG RE SUPP: 650.0000 mg | Freq: Four times a day (QID) | RECTAL | Status: DC | PRN

## 2022-03-02 MED ORDER — SORBITOL 70 % SOLN
300.0000 mL | TOPICAL_OIL | Freq: Once | ORAL | Status: AC
  Administered 2022-03-03: 300 mL via RECTAL
  Filled 2022-03-02: qty 90

## 2022-03-02 MED ORDER — FLUOXETINE HCL 20 MG PO CAPS
20.0000 mg | ORAL_CAPSULE | Freq: Every day | ORAL | Status: DC
  Administered 2022-03-03 – 2022-03-05 (×3): 20 mg via ORAL
  Filled 2022-03-02 (×3): qty 1

## 2022-03-02 MED ORDER — SODIUM CHLORIDE 0.9 % IV BOLUS
1000.0000 mL | Freq: Once | INTRAVENOUS | Status: AC
  Administered 2022-03-02: 1000 mL via INTRAVENOUS

## 2022-03-02 MED ORDER — ALBUTEROL SULFATE (2.5 MG/3ML) 0.083% IN NEBU: 2.5000 mg | INHALATION_SOLUTION | Freq: Four times a day (QID) | RESPIRATORY_TRACT | Status: DC | PRN

## 2022-03-02 MED ORDER — SODIUM CHLORIDE 0.9 % IV SOLN
2.0000 g | INTRAVENOUS | Status: DC
  Administered 2022-03-03 – 2022-03-05 (×3): 2 g via INTRAVENOUS
  Filled 2022-03-02 (×3): qty 20

## 2022-03-02 MED ORDER — TRAZODONE HCL 100 MG PO TABS
100.0000 mg | ORAL_TABLET | Freq: Every day | ORAL | Status: DC
  Administered 2022-03-02 – 2022-03-04 (×3): 100 mg via ORAL
  Filled 2022-03-02 (×3): qty 1

## 2022-03-02 MED ORDER — LEVOTHYROXINE SODIUM 25 MCG PO TABS
25.0000 ug | ORAL_TABLET | Freq: Every day | ORAL | Status: DC
  Administered 2022-03-02 – 2022-03-04 (×3): 25 ug via ORAL
  Filled 2022-03-02 (×3): qty 1

## 2022-03-02 MED ORDER — ONDANSETRON HCL 4 MG PO TABS
4.0000 mg | ORAL_TABLET | Freq: Four times a day (QID) | ORAL | Status: DC | PRN
Start: 2022-03-02 — End: 2022-03-06

## 2022-03-02 MED ORDER — METRONIDAZOLE 500 MG/100ML IV SOLN
500.0000 mg | Freq: Two times a day (BID) | INTRAVENOUS | Status: DC
  Administered 2022-03-02 – 2022-03-05 (×6): 500 mg via INTRAVENOUS
  Filled 2022-03-02 (×7): qty 100

## 2022-03-02 MED ORDER — POLYVINYL ALCOHOL 1.4 % OP SOLN
Freq: Four times a day (QID) | OPHTHALMIC | Status: DC | PRN
  Filled 2022-03-02: qty 15

## 2022-03-02 MED ORDER — ACETAMINOPHEN 325 MG PO TABS: 650.0000 mg | ORAL_TABLET | Freq: Four times a day (QID) | ORAL | Status: DC | PRN

## 2022-03-02 MED ORDER — IOHEXOL 350 MG/ML SOLN
65.0000 mL | Freq: Once | INTRAVENOUS | Status: AC | PRN
  Administered 2022-03-02: 65 mL via INTRAVENOUS

## 2022-03-02 MED ORDER — SODIUM CHLORIDE 0.9% FLUSH
3.0000 mL | Freq: Two times a day (BID) | INTRAVENOUS | Status: DC
  Administered 2022-03-03 – 2022-03-05 (×4): 3 mL via INTRAVENOUS

## 2022-03-02 MED ORDER — SENNOSIDES-DOCUSATE SODIUM 8.6-50 MG PO TABS
1.0000 | ORAL_TABLET | Freq: Every day | ORAL | Status: DC
  Administered 2022-03-02 – 2022-03-03 (×2): 1 via ORAL
  Filled 2022-03-02 (×2): qty 1

## 2022-03-02 MED ORDER — LORAZEPAM 2 MG/ML IJ SOLN: 0.5000 mg | Freq: Four times a day (QID) | INTRAMUSCULAR | Status: DC | PRN

## 2022-03-02 NOTE — Consult Note (Signed)
[facility] ?Consult Note ? ?Nicki Storch-McCleskey ?05-08-1924  ?[id number].   ? ?Requesting MD: Dr. Deno Etienne ?Chief Complaint/Reason for Consult:  ? ?HPI: April Stevenson is a 88 y.o. female who presented to the ED today after a fall. Hx provided via combination of chart review, family and patient - although limited 2/2 Alzheimer's Dementia and lethargy. She apparently fell sometime this morning and was found down.  This was unwitnessed.  Unknown how long she was down for.  Unknown if any head trauma or loss of consciousness.  Two days prior to this her son states that she was having some vague abdominal pain. She vomited once and has had suppressed appetite. Denies fever, chills, diarrhea, constipation. ? ?Trauma work-up completed by EDP including CTH and CT Cervical spine that was negative for acute injury. CXR showed some bibasilar areas of atelectasis versus consolidation with small left pleural effusion and mild cardiomegaly but no obvious rib fractures or pneumothorax.  The base of CT A/P did show small left and trace right pleural effusions with dependent left base airspace disease suspicious for mild pneumonia or aspiration. No other acute injuries noted on CT A/P however it did show Cholelithiasis with gallbladder wall thickening and pericholecystic edema without biliary ductal dilation. Labs were significant for WBC 17.9, lactic 2.1, CK 847, creatinine 1.21, alk phos 262, AST 488, ALT 526, T. bili 4.7.   ?General surgery asked to see for possible cholecystitis.   ? ?PMH significant for HTN, Hypothyroidism, GERD, Severe Alzheimer's Dementia ?Abdominal surgical history: Appendectomy, TAH ?Anticoagulants: None ?Former smoker with 20 pack year hx ?Lives at home with son and his wife ?Ambulates with a walker ? ?ROS: ?Review of Systems  ?Unable to perform ROS: Dementia  ?All systems reviewed and otherwise negative except for as above ? ?Family History  ?Problem Relation Age of Onset  ?  Heart attack Father   ? ? ?Past Medical History:  ?Diagnosis Date  ? Benign essential tremor 07/18/2018  ? Bilateral nonexudative age-related macular degeneration 07/18/2018  ? Cholestatic hepatitis   ? 2001  ? DDD (degenerative disc disease), lumbar 04/23/2018  ? Depression   ? Dry eyes, bilateral 07/18/2018  ? Fatigue   ? GERD (gastroesophageal reflux disease)   ? Hiatal hernia with GERD 07/29/2011  ? Hoarding behavior 04/23/2018  ? HTN (hypertension) 07/29/2011  ? Hyperlipidemia   ? Hypertension   ? Hypothyroidism 07/29/2011  ? Insomnia   ? Late onset Alzheimer's disease with behavioral disturbance ([place]) 04/23/2018  ? LBBB (left bundle branch block) 07/29/2011  ? Major depressive disorder 07/29/2016  ? Memory impairment 12/09/2016  ? Memory loss   ? Osteoporosis   ? Paranoid delusion ([place]) 07/18/2018  ? Psychophysiological insomnia 04/23/2018  ? Pulmonary HTN ([place]) 03/15/2019  ? 2 d echo 02/28/19 EF 55-60 %, mild to moderate mitral valve regurg, mild tricuspid regurg, severely elevated estimated right ventricular systolic pressure 85 mm Hg, and mild to moderate aortic valve regurg. No diastolic CHF noted.   ? Pure hypercholesterolemia 07/29/2011  ? Sciatica   ? Senile osteoporosis 04/23/2018  ? ? ?Past Surgical History:  ?Procedure Laterality Date  ? APPENDECTOMY    ? CARDIAC CATHETERIZATION    ? FACIAL COSMETIC SURGERY    ? TONSILLECTOMY    ? TOTAL ABDOMINAL HYSTERECTOMY W/ BILATERAL SALPINGOOPHORECTOMY    ? ? ?Social History:  reports that she has quit smoking. Her smoking use included cigarettes. She has a 10.00 pack-year smoking history. She has never used smokeless tobacco. She  reports current alcohol use of about 1.0 standard drink per week. She reports that she does not use drugs. ? ?Allergies:  ?Allergies  ?Allergen Reactions  ? Garlic   ? Lac Bovis   ? Lanolin   ? Penicillins   ?  ? rash  ? Statins   ?  myalgia  ? ? ?(Not in a hospital admission) ? ? ?Prior to Admission medications   ?Medication Sig Start Date End Date  Taking? Authorizing Provider  ?omeprazole (PRILOSEC) 20 MG capsule TAKE 1 CAPSULE(20 MG) BY MOUTH DAILY 02/04/22   Ngetich, Dinah C, NP  ?calcium carbonate (OS-CAL) 600 MG TABS tablet Take 600 mg by mouth daily with breakfast.    [provider]  ?Cholecalciferol (D3 HIGH POTENCY) 2000 units CAPS Take 1 capsule (2,000 Units total) by mouth daily. 04/18/18   Reed, Tiffany L, DO  ?donepezil (ARICEPT) 10 MG tablet Take 0.5 tablets (5 mg total) by mouth at bedtime for 7 days. Then discontinue 11/04/21 11/11/21  Ngetich, Dinah C, NP  ?FLUoxetine (PROZAC) 20 MG tablet TAKE 1 TABLET(20 MG) BY MOUTH DAILY 02/02/22   Ngetich, Dinah C, NP  ?furosemide (LASIX) 20 MG tablet Take 1 tablet (20 mg total) by mouth daily. 11/04/21 02/02/22  Ngetich, Dinah C, NP  ?hydrALAZINE (APRESOLINE) 25 MG tablet Take 1 tablet (25 mg total) by mouth every 8 (eight) hours. 11/15/21   Ngetich, Dinah C, NP  ?irbesartan (AVAPRO) 300 MG tablet TAKE 1 TABLET(300 MG) BY MOUTH DAILY 01/31/22   Ngetich, Dinah C, NP  ?levothyroxine (SYNTHROID) 25 MCG tablet TAKE 1 TABLET(25 MCG) BY MOUTH AT BEDTIME 01/31/22   Ngetich, Nelda Bucks, NP  ?Melatonin 10 MG TABS Take 1 tablet by mouth at bedtime. 01/31/20   Reed, Tiffany L, DO  ?methocarbamol (ROBAXIN) 500 MG tablet Take 0.5 tablets (250 mg total) by mouth every 8 (eight) hours as needed for muscle spasms. 01/14/21   Ngetich, Nelda Bucks, NP  ?Multiple Vitamin (MULTIVITAMIN WITH MINERALS) TABS tablet Take 1 tablet by mouth in the morning and at bedtime.    [provider]  ?potassium chloride SA (KLOR-CON M) 20 MEQ tablet TAKE 1 TABLET BY MOUTH EVERY DAY 02/02/22   Ngetich, Dinah C, NP  ?traZODone (DESYREL) 100 MG tablet Take 1 tablet (100 mg total) by mouth at bedtime. 11/04/21 02/02/22  Ngetich, Dinah C, NP  ?traZODone (DESYREL) 50 MG tablet TAKE 1 TABLET(50 MG) BY MOUTH AT BEDTIME 02/02/22   Ngetich, Dinah C, NP  ?vitamin C (ASCORBIC ACID) 500 MG tablet Take 1 tablet (500 mg total) by mouth daily. 07/18/18   Reed,  Tiffany L, DO  ? ? ?Blood pressure (!) 144/69, pulse 71, temperature 97.6 ?F (36.4 ?C), temperature source Oral, resp. rate (!) 23, height $RemoveBe'5\' 6"'[id number]$  (1.676 m), weight 59 kg, SpO2 91 %. ?Physical Exam: ?General: frail elderly female lying in bed in no acute distress ?Heart: regular, rate, and rhythm.  +murmur. Palpable pedal pulses bilaterally ?Lungs: CTAB, no wheezes, rhonchi, or rales noted.  Respiratory effort nonlabored ?Abd: soft, nondistended, nontender to light and deep palpation of the entire abdomen  ?MS: calves soft and nontender. Superficial abrasions noted to bilateral anterior knees ?Skin: warm and dry with no masses, lesions, or rashes ?Psych: unable to assess, patient snoring and difficult to arouse  ? ?Results for orders placed or performed during the hospital encounter of 03/02/22 (from the past 48 hour(s))  ?Comprehensive metabolic panel     Status: Abnormal  ? Collection Time: 03/02/22 10:22 AM  ?  Result Value Ref Range  ? Sodium 134 (L) 135 - 145 mmol/L  ? Potassium 3.9 3.5 - 5.1 mmol/L  ? Chloride 100 98 - 111 mmol/L  ? CO2 25 22 - 32 mmol/L  ? Glucose, Bld 165 (H) 70 - 99 mg/dL  ?  Comment: Glucose reference range applies only to samples taken after fasting for at least 8 hours.  ? BUN 22 8 - 23 mg/dL  ? Creatinine, Ser 1.21 (H) 0.44 - 1.00 mg/dL  ? Calcium 8.8 (L) 8.9 - 10.3 mg/dL  ? Total Protein 5.9 (L) 6.5 - 8.1 g/dL  ? Albumin 2.9 (L) 3.5 - 5.0 g/dL  ? AST 488 (H) 15 - 41 U/L  ? ALT 526 (H) 0 - 44 U/L  ? Alkaline Phosphatase 262 (H) 38 - 126 U/L  ? Total Bilirubin 4.7 (H) 0.3 - 1.2 mg/dL  ? GFR, Estimated 41 (L) >60 mL/min  ?  Comment: (NOTE) ?Calculated using the CKD-EPI Creatinine Equation (2021) ?  ? Anion gap 9 5 - 15  ?  Comment: Performed at [facility], [place] [address] [place], [place] [address]  ?CBC with Differential/Platelet     Status: Abnormal  ? Collection Time: 03/02/22 10:22 AM  ?Result Value Ref Range  ? WBC 17.9 (H) 4.0 - 10.5 K/uL  ? RBC 3.72 (L) 3.87 - 5.11 MIL/uL  ?  Hemoglobin 11.8 (L) 12.0 - 15.0 g/dL  ? HCT 35.0 (L) 36.0 - 46.0 %  ? MCV 94.1 80.0 - 100.0 fL  ? MCH 31.7 26.0 - 34.0 pg  ? MCHC 33.7 30.0 - 36.0 g/dL  ? RDW 13.3 11.5 - 15.5 %  ? Platelets 144 (L) 150 -

## 2022-03-02 NOTE — ED Provider Notes (Signed)
[facility] EMERGENCY DEPARTMENT Provider Note   CSN: [id number] Arrival date & time: 03/02/22  1004     History  Chief Complaint  Patient presents with   April Stevenson is a 88 y.o. female.  88 yo F with a chief complaint of a fall.  Patient was found on the ground this morning.  Unknown timing of the fall.  Patient was unable to get herself up off the ground.  She does not remember falling.  Denies any specific injury.  Denies any areas of pain.   Fall      Home Medications Prior to Admission medications   Medication Sig Start Date End Date Taking? Authorizing Provider  calcium carbonate (OS-CAL) 600 MG TABS tablet Take 600 mg by mouth daily with breakfast.   Yes [provider]  Cholecalciferol (D3 HIGH POTENCY) 2000 units CAPS Take 1 capsule (2,000 Units total) by mouth daily. 04/18/18  Yes Reed, Tiffany L, DO  FLUoxetine (PROZAC) 20 MG tablet TAKE 1 TABLET(20 MG) BY MOUTH DAILY Patient taking differently: Take 20 mg by mouth daily. 02/02/22  Yes Ngetich, Dinah C, NP  furosemide (LASIX) 20 MG tablet Take 1 tablet (20 mg total) by mouth daily. 11/04/21 03/02/22 Yes Ngetich, Dinah C, NP  hydrALAZINE (APRESOLINE) 25 MG tablet Take 1 tablet (25 mg total) by mouth every 8 (eight) hours. Patient taking differently: Take 25 mg by mouth in the morning, at noon, and at bedtime. 11/15/21  Yes Ngetich, Dinah C, NP  irbesartan (AVAPRO) 300 MG tablet TAKE 1 TABLET(300 MG) BY MOUTH DAILY Patient taking differently: Take 300 mg by mouth daily. 01/31/22  Yes Ngetich, Dinah C, NP  levothyroxine (SYNTHROID) 25 MCG tablet TAKE 1 TABLET(25 MCG) BY MOUTH AT BEDTIME Patient taking differently: Take 25 mcg by mouth at bedtime. 01/31/22  Yes Ngetich, Dinah C, NP  Melatonin 10 MG TABS Take 1 tablet by mouth at bedtime. Patient taking differently: Take 10 mg by mouth at bedtime. 01/31/20  Yes Reed, Tiffany L, DO  Multiple Vitamins-Minerals (PRESERVISION AREDS  PO) Take 1 capsule by mouth daily.   Yes [provider]  omeprazole (PRILOSEC) 20 MG capsule TAKE 1 CAPSULE(20 MG) BY MOUTH DAILY Patient taking differently: Take 20 mg by mouth daily. 02/04/22  Yes Ngetich, Dinah C, NP  potassium chloride SA (KLOR-CON M) 20 MEQ tablet TAKE 1 TABLET BY MOUTH EVERY DAY Patient taking differently: Take 20 mEq by mouth daily. 02/02/22  Yes Ngetich, Dinah C, NP  Propylene Glycol (SYSTANE COMPLETE OP) Place 1 drop into both eyes 4 (four) times daily as needed (irritation, burning).   Yes [provider]  traZODone (DESYREL) 100 MG tablet Take 1 tablet (100 mg total) by mouth at bedtime. 11/04/21 03/02/22 Yes Ngetich, Dinah C, NP  vitamin C (ASCORBIC ACID) 500 MG tablet Take 1 tablet (500 mg total) by mouth daily. 07/18/18  Yes Reed, Tiffany L, DO  donepezil (ARICEPT) 10 MG tablet Take 0.5 tablets (5 mg total) by mouth at bedtime for 7 days. Then discontinue Patient not taking: Reported on 03/02/2022 11/04/21 11/11/21  Ngetich, Dinah C, NP      Allergies    Garlic, Lac bovis, Lanolin, Statins, and Penicillins    Review of Systems   Review of Systems  Physical Exam Updated Vital Signs BP (!) 154/57 (BP Location: Right Arm)   Pulse (!) 57   Temp 98.3 F (36.8 C) (Oral)   Resp 17   Ht '5\' 6"'$  (1.676  m)   Wt 58 kg   SpO2 99%   BMI 20.64 kg/m  Physical Exam Vitals and nursing note reviewed.  Constitutional:      General: She is not in acute distress.    Appearance: She is well-developed. She is not diaphoretic.  HENT:     Head: Normocephalic and atraumatic.  Eyes:     Pupils: Pupils are equal, round, and reactive to light.  Cardiovascular:     Rate and Rhythm: Normal rate and regular rhythm.     Heart sounds: No murmur heard.   No friction rub. No gallop.  Pulmonary:     Effort: Pulmonary effort is normal.     Breath sounds: No wheezing or rales.  Abdominal:     General: There is no distension.     Palpations: Abdomen is soft.      Tenderness: There is no abdominal tenderness.  Musculoskeletal:        General: No tenderness.     Cervical back: Normal range of motion and neck supple.  Skin:    General: Skin is warm and dry.     Comments: Signs of injury to the skin about the left anterior neck bilateral knees about the chest.  Neurological:     Mental Status: She is alert.  Psychiatric:        Behavior: Behavior normal.    ED Results / Procedures / Treatments   Labs (all labs ordered are listed, but only abnormal results are displayed) Labs Reviewed  COMPREHENSIVE METABOLIC PANEL - Abnormal; Notable for the following components:      Result Value   Sodium 134 (*)    Glucose, Bld 165 (*)    Creatinine, Ser 1.21 (*)    Calcium 8.8 (*)    Total Protein 5.9 (*)    Albumin 2.9 (*)    AST 488 (*)    ALT 526 (*)    Alkaline Phosphatase 262 (*)    Total Bilirubin 4.7 (*)    GFR, Estimated 41 (*)    All other components within normal limits  LACTIC ACID, PLASMA - Abnormal; Notable for the following components:   Lactic Acid, Venous 2.1 (*)    All other components within normal limits  CBC WITH DIFFERENTIAL/PLATELET - Abnormal; Notable for the following components:   WBC 17.9 (*)    RBC 3.72 (*)    Hemoglobin 11.8 (*)    HCT 35.0 (*)    Platelets 144 (*)    Neutro Abs 16.7 (*)    Lymphs Abs 0.2 (*)    Abs Immature Granulocytes 0.11 (*)    All other components within normal limits  URINALYSIS, ROUTINE W REFLEX MICROSCOPIC - Abnormal; Notable for the following components:   Color, Urine AMBER (*)    Glucose, UA >=500 (*)    Hgb urine dipstick MODERATE (*)    Bilirubin Urine SMALL (*)    Protein, ur 30 (*)    All other components within normal limits  CK - Abnormal; Notable for the following components:   Total CK 847 (*)    All other components within normal limits  ACETAMINOPHEN LEVEL - Abnormal; Notable for the following components:   Acetaminophen (Tylenol), Serum <10 (*)    All other components  within normal limits  COMPREHENSIVE METABOLIC PANEL - Abnormal; Notable for the following components:   Glucose, Bld 107 (*)    Creatinine, Ser 1.16 (*)    Calcium 8.7 (*)    Total Protein 5.5 (*)  Albumin 2.7 (*)    AST 314 (*)    ALT 426 (*)    Alkaline Phosphatase 248 (*)    Total Bilirubin 4.1 (*)    GFR, Estimated 43 (*)    All other components within normal limits  PROTIME-INR - Abnormal; Notable for the following components:   Prothrombin Time 20.4 (*)    INR 1.8 (*)    All other components within normal limits  CBC - Abnormal; Notable for the following components:   RBC 3.17 (*)    Hemoglobin 10.3 (*)    HCT 29.6 (*)    Platelets 106 (*)    All other components within normal limits  COMPREHENSIVE METABOLIC PANEL - Abnormal; Notable for the following components:   Creatinine, Ser 1.10 (*)    Calcium 8.4 (*)    Total Protein 4.8 (*)    Albumin 2.4 (*)    AST 191 (*)    ALT 297 (*)    Alkaline Phosphatase 193 (*)    Total Bilirubin 3.1 (*)    GFR, Estimated 46 (*)    All other components within normal limits  CBG MONITORING, ED - Abnormal; Notable for the following components:   Glucose-Capillary 158 (*)    All other components within normal limits  CULTURE, BLOOD (ROUTINE X 2)  CULTURE, BLOOD (ROUTINE X 2)  AMMONIA  HEPATITIS PANEL, ACUTE  PROCALCITONIN  TSH    EKG None  Radiology CT HEAD WO CONTRAST  Result Date: 03/02/2022 CLINICAL DATA:  Neck trauma (Age >= 65y); Head trauma, minor (Age >= 65y). History of dens fracture. EXAM: CT HEAD WITHOUT CONTRAST CT CERVICAL SPINE WITHOUT CONTRAST TECHNIQUE: Multidetector CT imaging of the head and cervical spine was performed following the standard protocol without intravenous contrast. Multiplanar CT image reconstructions of the cervical spine were also generated. RADIATION DOSE REDUCTION: This exam was performed according to the departmental dose-optimization program which includes automated exposure control,  adjustment of the mA and/or kV according to patient size and/or use of iterative reconstruction technique. COMPARISON:  05/21/2021 FINDINGS: CT HEAD FINDINGS Brain: No evidence of acute infarction, hemorrhage, hydrocephalus, extra-axial collection or mass lesion/mass effect. Scattered low-density changes within the periventricular and subcortical white matter compatible with chronic microvascular ischemic change. Mild diffuse cerebral volume loss. Vascular: No hyperdense vessel or unexpected calcification. Skull: Normal. Negative for fracture or focal lesion. Sinuses/Orbits: No acute finding. Other: Negative for scalp hematoma. CT CERVICAL SPINE FINDINGS Alignment: Unchanged mild posterior subluxation of C1 relative to C2. Unchanged grade 1 anterolisthesis of C3 on C4. No facet dislocation. Skull base and vertebrae: Chronic ununited type 2 dens fracture. The dens remains mildly displaced posteriorly relative to the body of C2, unchanged. No new or acute fractures of the cervical spine. No focal lytic or sclerotic bone lesion. Soft tissues and spinal canal: No prevertebral fluid or swelling. No visible canal hematoma. Disc levels: Degenerative disc disease most pronounced at C5-6. Multilevel bilateral facet arthropathy, left worse than right. Progressive arthropathy at the C1-2 articulation. Upper chest: Negative. Other: None. IMPRESSION: 1. No acute intracranial abnormality. 2. No acute cervical spine fracture. 3. Chronic ununited type 2 dens fracture, unchanged from prior. 4. Chronic microvascular ischemic change and cerebral volume loss. Electronically Signed   By: Davina Poke D.O.   On: 03/02/2022 11:42   CT CERVICAL SPINE WO CONTRAST  Result Date: 03/02/2022 CLINICAL DATA:  Neck trauma (Age >= 65y); Head trauma, minor (Age >= 65y). History of dens fracture. EXAM: CT HEAD WITHOUT CONTRAST  CT CERVICAL SPINE WITHOUT CONTRAST TECHNIQUE: Multidetector CT imaging of the head and cervical spine was performed  following the standard protocol without intravenous contrast. Multiplanar CT image reconstructions of the cervical spine were also generated. RADIATION DOSE REDUCTION: This exam was performed according to the departmental dose-optimization program which includes automated exposure control, adjustment of the mA and/or kV according to patient size and/or use of iterative reconstruction technique. COMPARISON:  05/21/2021 FINDINGS: CT HEAD FINDINGS Brain: No evidence of acute infarction, hemorrhage, hydrocephalus, extra-axial collection or mass lesion/mass effect. Scattered low-density changes within the periventricular and subcortical white matter compatible with chronic microvascular ischemic change. Mild diffuse cerebral volume loss. Vascular: No hyperdense vessel or unexpected calcification. Skull: Normal. Negative for fracture or focal lesion. Sinuses/Orbits: No acute finding. Other: Negative for scalp hematoma. CT CERVICAL SPINE FINDINGS Alignment: Unchanged mild posterior subluxation of C1 relative to C2. Unchanged grade 1 anterolisthesis of C3 on C4. No facet dislocation. Skull base and vertebrae: Chronic ununited type 2 dens fracture. The dens remains mildly displaced posteriorly relative to the body of C2, unchanged. No new or acute fractures of the cervical spine. No focal lytic or sclerotic bone lesion. Soft tissues and spinal canal: No prevertebral fluid or swelling. No visible canal hematoma. Disc levels: Degenerative disc disease most pronounced at C5-6. Multilevel bilateral facet arthropathy, left worse than right. Progressive arthropathy at the C1-2 articulation. Upper chest: Negative. Other: None. IMPRESSION: 1. No acute intracranial abnormality. 2. No acute cervical spine fracture. 3. Chronic ununited type 2 dens fracture, unchanged from prior. 4. Chronic microvascular ischemic change and cerebral volume loss. Electronically Signed   By: Davina Poke D.O.   On: 03/02/2022 11:42   CT ABDOMEN  PELVIS W CONTRAST  Result Date: 03/02/2022 CLINICAL DATA:  acute hepatitis.  Fall. EXAM: CT ABDOMEN AND PELVIS WITH CONTRAST TECHNIQUE: Multidetector CT imaging of the abdomen and pelvis was performed using the standard protocol following bolus administration of intravenous contrast. RADIATION DOSE REDUCTION: This exam was performed according to the departmental dose-optimization program which includes automated exposure control, adjustment of the mA and/or kV according to patient size and/or use of iterative reconstruction technique. CONTRAST:  44m OMNIPAQUE IOHEXOL 350 MG/ML SOLN COMPARISON:  None Available. FINDINGS: Lower chest: Lingular scarring or atelectasis. Dependent left lower lobe airspace disease. Small left pleural effusion. Trace right pleural fluid. Mild cardiomegaly. Small hiatal hernia. Presumed pericardial cyst of 4.6 cm in the right cardiophrenic angle. Hepatobiliary: Nonspecific caudate lobe enlargement. Hepatic calcifications which are likely related to prior infection or inflammation. Probable hepatic steatosis. Subtle gallstones within the fundus. Gallbladder wall thickening and pericholecystic edema including on 31/3. No biliary duct dilatation. Pancreas: Normal pancreas for age. Spleen: Subcentimeter hypoattenuating splenic lesions are nonspecific but of doubtful clinical significance. No splenomegaly. Adrenals/Urinary Tract: Normal adrenal glands. Too small to characterize interpolar left renal lesion. Normal right kidney for age. No hydronephrosis. Normal urinary bladder. Stomach/Bowel: Gastric body underdistention. Rectal stool ball of 8.7 cm. Normal small bowel. Vascular/Lymphatic: Advanced aortic and branch vessel atherosclerosis. No abdominopelvic adenopathy. Reproductive: Hysterectomy.  No adnexal mass. Other: No significant free fluid.  No free intraperitoneal air. Musculoskeletal: Lumbar spondylosis with S-shaped thoracolumbar spine curvature. IMPRESSION: 1. No posttraumatic  deformity identified. 2. Findings highly suspicious for acute cholecystitis. 3. Rectal stool ball suggests constipation or fecal impaction. 4. Small hiatal hernia. Gastric body apparent wall thickening is at least partially felt to be due to underdistention. Correlate with symptoms of gastritis. 5. Small left and trace right pleural effusions with dependent  left base airspace disease. Suspicious for mild pneumonia or aspiration with compressive atelectasis felt less likely. 6. Probable hepatic steatosis. Electronically Signed   By: Abigail Miyamoto M.D.   On: 03/02/2022 13:36   DG Chest Port 1 View  Result Date: 03/02/2022 CLINICAL DATA:  88 year old female with history of altered mental status. EXAM: PORTABLE CHEST 1 VIEW COMPARISON:  Chest x-ray 05/21/2021. FINDINGS: Bibasilar (left-greater-than-right) opacities indicative of atelectasis and/or consolidation. Small left pleural effusion. No definite right pleural effusion. No pneumothorax. No evidence of pulmonary edema. Heart size is mildly enlarged. Upper mediastinal contours are within normal limits. Atherosclerotic calcifications are noted in the thoracic aorta. IMPRESSION: 1. Bibasilar areas of atelectasis and/or consolidation. 2. Small left pleural effusion. 3. Mild cardiomegaly. 4. Aortic atherosclerosis. Electronically Signed   By: Vinnie Langton M.D.   On: 03/02/2022 11:12   US Abdomen Limited RUQ (LIVER/GB)  Result Date: 03/02/2022 CLINICAL DATA:  Right upper quadrant pain. EXAM: ULTRASOUND ABDOMEN LIMITED RIGHT UPPER QUADRANT COMPARISON:  CT abdomen and pelvis 03/02/2022 FINDINGS: Gallbladder: Gallstones are present. Multiple small layering gallstones are present measuring up to 3 mm. There is gallbladder wall thickening measuring 4 mm. Sonographic Percell Miller sign is negative. Common bile duct: Diameter: 7 mm.  (Within normal limits). Liver: There is a 10 x 8 x 8 mm hypoechoic mass in the right lobe of the liver without vascularity. Within normal  limits in parenchymal echogenicity. Portal vein is patent on color Doppler imaging with normal direction of blood flow towards the liver. Other: None. IMPRESSION: 1. Cholelithiasis with gallbladder wall thickening. Negative sonographic Murphy sign. No biliary ductal dilatation. Findings may related to acute or chronic cholecystitis. Correlate clinically. 2. 1 cm hypoechoic mass in the right lobe of the liver favored as hemangioma. Electronically Signed   By: Ronney Asters M.D.   On: 03/02/2022 17:07    Procedures Procedures    Medications Ordered in ED Medications  metroNIDAZOLE (FLAGYL) IVPB 500 mg (0 mg Intravenous Stopped 03/03/22 0237)  cefTRIAXone (ROCEPHIN) 2 g in sodium chloride 0.9 % 100 mL IVPB (has no administration in time range)  enoxaparin (LOVENOX) injection 30 mg (30 mg Subcutaneous Given 03/02/22 1842)  sodium chloride flush (NS) 0.9 % injection 3 mL (3 mLs Intravenous Not Given 03/02/22 2346)  acetaminophen (TYLENOL) tablet 650 mg (has no administration in time range)    Or  acetaminophen (TYLENOL) suppository 650 mg (has no administration in time range)  ondansetron (ZOFRAN) tablet 4 mg (has no administration in time range)    Or  ondansetron (ZOFRAN) injection 4 mg (has no administration in time range)  albuterol (PROVENTIL) (2.5 MG/3ML) 0.083% nebulizer solution 2.5 mg (has no administration in time range)  LORazepam (ATIVAN) injection 0.5 mg (has no administration in time range)  FLUoxetine (PROZAC) capsule 20 mg (has no administration in time range)  traZODone (DESYREL) tablet 100 mg (100 mg Oral Given 03/02/22 2346)  levothyroxine (SYNTHROID) tablet 25 mcg (25 mcg Oral Given 03/02/22 2345)  pantoprazole (PROTONIX) EC tablet 40 mg (40 mg Oral Given 03/02/22 2345)  polyvinyl alcohol (LIQUIFILM TEARS) 1.4 % ophthalmic solution (has no administration in time range)  melatonin tablet 10 mg (10 mg Oral Given 03/02/22 2345)  senna-docusate (Senokot-S) tablet 1 tablet (1 tablet Oral  Given 03/02/22 2346)  sodium chloride 0.9 % bolus 1,000 mL (0 mLs Intravenous Stopped 03/02/22 1357)  cefTRIAXone (ROCEPHIN) 2 g in sodium chloride 0.9 % 100 mL IVPB (0 g Intravenous Stopped 03/02/22 1437)  iohexol (OMNIPAQUE) 350 MG/ML injection  65 mL (65 mLs Intravenous Contrast Given 03/02/22 1324)  sorbitol, milk of mag, mineral oil, glycerin (SMOG) enema (300 mLs Rectal Given 03/03/22 0602)    ED Course/ Medical Decision Making/ A&P                           Medical Decision Making Amount and/or Complexity of Data Reviewed Labs: ordered. Radiology: ordered.  Risk Prescription drug management. Decision regarding hospitalization.   88 yo F with a chief complaints of a fall.  This was unwitnessed.  Patient was unable to get up afterwards and spent a prolonged time on the ground.  She has some signs of with some skin tears and erythema.  Obtain a CT of the head and C-spine.  Blood work.  Bolus of IV fluids.  Reassess.  Patient with a leukocytosis of 18,000.  Lactate mildly elevated at 2.  Patient's LFTs are all elevated with a total bilirubin elevation as well.  This change from her lab work that was done in January of this year.  Patient's son and daughter-in-law have arrived in the emergency department.  They provided further history.  Tell me that she has had progressive fatigue over the past 48 hours or so and had been complaining that her stomach was upset.  Had 1 episode of emesis.  They think that she fell early this morning and then once she was assisted back into a chair they thought she was back to normal but was unable to bear her own weight.  Patient's CT scan has come back concerning for acute cholecystitis.  I discussed this with general surgery who recommended discussing with GI and admitting to medicine and they would come see the patient at bedside.  I discussed the case with GI who will come see the patient at bedside.  The patients results and plan were reviewed and  discussed.   Any x-rays performed were independently reviewed by myself.   Differential diagnosis were considered with the presenting HPI.  Medications  metroNIDAZOLE (FLAGYL) IVPB 500 mg (0 mg Intravenous Stopped 03/03/22 0237)  cefTRIAXone (ROCEPHIN) 2 g in sodium chloride 0.9 % 100 mL IVPB (has no administration in time range)  enoxaparin (LOVENOX) injection 30 mg (30 mg Subcutaneous Given 03/02/22 1842)  sodium chloride flush (NS) 0.9 % injection 3 mL (3 mLs Intravenous Not Given 03/02/22 2346)  acetaminophen (TYLENOL) tablet 650 mg (has no administration in time range)    Or  acetaminophen (TYLENOL) suppository 650 mg (has no administration in time range)  ondansetron (ZOFRAN) tablet 4 mg (has no administration in time range)    Or  ondansetron (ZOFRAN) injection 4 mg (has no administration in time range)  albuterol (PROVENTIL) (2.5 MG/3ML) 0.083% nebulizer solution 2.5 mg (has no administration in time range)  LORazepam (ATIVAN) injection 0.5 mg (has no administration in time range)  FLUoxetine (PROZAC) capsule 20 mg (has no administration in time range)  traZODone (DESYREL) tablet 100 mg (100 mg Oral Given 03/02/22 2346)  levothyroxine (SYNTHROID) tablet 25 mcg (25 mcg Oral Given 03/02/22 2345)  pantoprazole (PROTONIX) EC tablet 40 mg (40 mg Oral Given 03/02/22 2345)  polyvinyl alcohol (LIQUIFILM TEARS) 1.4 % ophthalmic solution (has no administration in time range)  melatonin tablet 10 mg (10 mg Oral Given 03/02/22 2345)  senna-docusate (Senokot-S) tablet 1 tablet (1 tablet Oral Given 03/02/22 2346)  sodium chloride 0.9 % bolus 1,000 mL (0 mLs Intravenous Stopped 03/02/22 1357)  cefTRIAXone (ROCEPHIN) 2 g  in sodium chloride 0.9 % 100 mL IVPB (0 g Intravenous Stopped 03/02/22 1437)  iohexol (OMNIPAQUE) 350 MG/ML injection 65 mL (65 mLs Intravenous Contrast Given 03/02/22 1324)  sorbitol, milk of mag, mineral oil, glycerin (SMOG) enema (300 mLs Rectal Given 03/03/22 0602)    Vitals:    03/02/22 2033 03/02/22 2348 03/03/22 0500 03/03/22 0529  BP: (!) 150/60 (!) 138/56  (!) 154/57  Pulse: 63 (!) 59  (!) 57  Resp: '18 17  17  '$ Temp: 98.4 F (36.9 C) 98.2 F (36.8 C)  98.3 F (36.8 C)  TempSrc: Oral Oral  Oral  SpO2: 99% 97%  99%  Weight:   58 kg   Height:        Final diagnoses:  Acute cholecystitis  LFT elevation    Admission/ observation were discussed with the admitting physician, patient and/or family and they are comfortable with the plan.            Final Clinical Impression(s) / ED Diagnoses Final diagnoses:  Acute cholecystitis  LFT elevation    Rx / DC Orders ED Discharge Orders     None         Deno Etienne, DO 03/03/22 [phone]

## 2022-03-02 NOTE — ED Notes (Signed)
US at bedside

## 2022-03-02 NOTE — H&P (Addendum)
?History and Physical  ? ? ?PatientFransheska Stevenson HDQ:[id number] DOB: 09/12/1924 ?DOA: 03/02/2022 ?DOS: the patient was seen and examined on 03/02/2022 ?PCP: April Stevenson, April Bucks, NP  ?Patient coming from: Home via EMS ? ?Chief Complaint:  ?Chief Complaint  ?Patient presents with  ? Fall  ? ?HPI: April Stevenson is a 88 y.o. female with medical history significant of hypertension, hyperlipidemia, hypothyroidism, Alzheimer's dementia, and hiatal hernia with GERD who presents after being found down this morning.  History is obtained from the patient's son and daughter-in-law with whom she lives.  At baseline they report that the patient ambulates with use of a walker and dementia is fairly severe to the point which she sometimes calls her son and daughter-in-law mom and dad.  Yesterday morning after she woke up she did not appear to be feeling well.  They noticed that she was walking like normal and had more of a shuffling gait.  She complained of abdominal pain and nausea.  They tried changing reviewed to do saltine crackers, but reported that she had an episode of vomiting which she has never done in last 2 years of living with them.  Her son also noted that it seemed like she was too weak to stand and was possibly leaning towards the left.  The patient was reported as stating that her legs gave out after she fell, but also had complained of some leg pain.  Son notes that history from her is unreliable due to her dementia history. ? ?On admission to the emergency department patient was noted to be afebrile with respirations 10-23, blood pressures maintained, and O2 saturations as low as 89% which patient was placed on 2 Stevenson of nasal cannula oxygen with improvement.  Labs significant for WBC 17.9, hemoglobin 11.8, platelets 144, sodium 134, BUN 22, creatinine 1.21, glucose 165, alkaline phosphatase 266, albumin 2.9, AST 488, ALT 526, total bilirubin 4.7, ammonia 25, CK8 47, and lactic acid 2.1.  CT scan  of the head and cervical spine did not note any acute abnormalities, but did note a chronic ununited type II dens fracture unchanged from prior imaging from 05/2021.  Chest x-ray noted bibasilar atelectasis/consolidation with small left-sided pleural effusion and mild cardiomegaly.  CT scan of the abdomen pelvis noted findings highly suspicious for acute cholecystitis, rectal stool ball suggesting constipation or fecal impaction, small hiatal hernia and small leftpatient had been given 1 Stevenson normal saline IV fluids, Rocephin, and metronidazole.   ? ?Review of Systems: As mentioned in the history of present illness. All other systems reviewed and are negative. ?Past Medical History:  ?Diagnosis Date  ? Benign essential tremor 07/18/2018  ? Bilateral nonexudative age-related macular degeneration 07/18/2018  ? Cholestatic hepatitis   ? 2001  ? DDD (degenerative disc disease), lumbar 04/23/2018  ? Depression   ? Dry eyes, bilateral 07/18/2018  ? Fatigue   ? GERD (gastroesophageal reflux disease)   ? Hiatal hernia with GERD 07/29/2011  ? Hoarding behavior 04/23/2018  ? HTN (hypertension) 07/29/2011  ? Hyperlipidemia   ? Hypertension   ? Hypothyroidism 07/29/2011  ? Insomnia   ? Late onset Alzheimer's disease with behavioral disturbance ([place]) 04/23/2018  ? LBBB (left bundle branch block) 07/29/2011  ? Major depressive disorder 07/29/2016  ? Memory impairment 12/09/2016  ? Memory loss   ? Osteoporosis   ? Paranoid delusion ([place]) 07/18/2018  ? Psychophysiological insomnia 04/23/2018  ? Pulmonary HTN ([place]) 03/15/2019  ? 2 d echo 02/28/19 EF 55-60 %, mild to moderate mitral  valve regurg, mild tricuspid regurg, severely elevated estimated right ventricular systolic pressure 85 mm Hg, and mild to moderate aortic valve regurg. No diastolic CHF noted.   ? Pure hypercholesterolemia 07/29/2011  ? Sciatica   ? Senile osteoporosis 04/23/2018  ? ?Past Surgical History:  ?Procedure Laterality Date  ? APPENDECTOMY    ? CARDIAC CATHETERIZATION    ? FACIAL  COSMETIC SURGERY    ? TONSILLECTOMY    ? TOTAL ABDOMINAL HYSTERECTOMY W/ BILATERAL SALPINGOOPHORECTOMY    ? ?Social History:  reports that she has quit smoking. Her smoking use included cigarettes. She has a 10.00 pack-year smoking history. She has never used smokeless tobacco. She reports current alcohol use of about 1.0 standard drink per week. She reports that she does not use drugs. ? ?Allergies  ?Allergen Reactions  ? Garlic   ? [place]   ? Lanolin   ? Penicillins   ?  ? rash  ? Statins   ?  myalgia  ? ? ?Family History  ?Problem Relation Age of Onset  ? Heart attack Father   ? ? ?Prior to Admission medications   ?Medication Sig Start Date End Date Taking? Authorizing Provider  ?calcium carbonate (OS-CAL) 600 MG TABS tablet Take 600 mg by mouth daily with breakfast.   Yes [provider]  ?Cholecalciferol (D3 HIGH POTENCY) 2000 units CAPS Take 1 capsule (2,000 Units total) by mouth daily. 04/18/18  Yes April Stevenson, April L, DO  ?FLUoxetine (PROZAC) 20 MG tablet TAKE 1 TABLET(20 MG) BY MOUTH DAILY ?Patient taking differently: Take 20 mg by mouth daily. 02/02/22  Yes April Stevenson, April C, NP  ?furosemide (LASIX) 20 MG tablet Take 1 tablet (20 mg total) by mouth daily. 11/04/21 03/02/22 Yes April Stevenson, April C, NP  ?omeprazole (PRILOSEC) 20 MG capsule TAKE 1 CAPSULE(20 MG) BY MOUTH DAILY 02/04/22   April Stevenson, April C, NP  ?donepezil (ARICEPT) 10 MG tablet Take 0.5 tablets (5 mg total) by mouth at bedtime for 7 days. Then discontinue ?Patient not taking: Reported on 03/02/2022 11/04/21 11/11/21  April Stevenson, April C, NP  ?hydrALAZINE (APRESOLINE) 25 MG tablet Take 1 tablet (25 mg total) by mouth every 8 (eight) hours. 11/15/21   April Stevenson, April C, NP  ?irbesartan (AVAPRO) 300 MG tablet TAKE 1 TABLET(300 MG) BY MOUTH DAILY 01/31/22   April Stevenson, April C, NP  ?levothyroxine (SYNTHROID) 25 MCG tablet TAKE 1 TABLET(25 MCG) BY MOUTH AT BEDTIME 01/31/22   April Stevenson, April Bucks, NP  ?Melatonin 10 MG TABS Take 1 tablet by mouth at bedtime. 01/31/20    April Stevenson, April L, DO  ?methocarbamol (ROBAXIN) 500 MG tablet Take 0.5 tablets (250 mg total) by mouth every 8 (eight) hours as needed for muscle spasms. 01/14/21   April Stevenson, April Bucks, NP  ?Multiple Vitamin (MULTIVITAMIN WITH MINERALS) TABS tablet Take 1 tablet by mouth in the morning and at bedtime.    [provider]  ?potassium chloride SA (KLOR-CON M) 20 MEQ tablet TAKE 1 TABLET BY MOUTH EVERY DAY 02/02/22   April Stevenson, April C, NP  ?traZODone (DESYREL) 100 MG tablet Take 1 tablet (100 mg total) by mouth at bedtime. 11/04/21 02/02/22  April Stevenson, April C, NP  ?traZODone (DESYREL) 50 MG tablet TAKE 1 TABLET(50 MG) BY MOUTH AT BEDTIME 02/02/22   April Stevenson, April C, NP  ?vitamin Stevenson (ASCORBIC ACID) 500 MG tablet Take 1 tablet (500 mg total) by mouth daily. 07/18/18   April Stevenson, April L, DO  ? ? ?Physical Exam: ?Vitals:  ? 03/02/22 1415 03/02/22 1430 03/02/22 1445  03/02/22 1530  ?BP: (!) 122/56 (!) 135/54 (!) 118/55 128/70  ?Pulse: 66 71 65 65  ?Resp: 20 (!) '21 14 17  '$ ?Temp:      ?TempSrc:      ?SpO2: (!) 89%  91% 98%  ?Weight:      ?Height:      ? ?Constitutional: Lethargic elderly female who was awakened temporarily to stimuli ?Eyes: PERRL, lids and conjunctivae normal ?ENMT: Mucous membranes are moist. Posterior pharynx clear of any exudate or lesions.Normal dentition.  ?Neck: normal, supple, no masses, no thyromegaly ?Respiratory: clear to auscultation bilaterally, no wheezing, no crackles. Normal respiratory effort. No accessory muscle use.  ?Cardiovascular: Regular rate and rhythm, no murmurs / rubs / gallops. No extremity edema. 2+ pedal pulses. No carotid bruits.  ?Abdomen: no tenderness, no masses palpated.  Bowel sounds positive.  ?Musculoskeletal: no clubbing / cyanosis.  ?Skin: no rashes, lesions, ulcers. No induration ?Neurologic: Unable to fully assess at this time due to lethargy.    ?Psychiatric: Poor memory.  Lethargic and oriented only to self. ? ?Data Reviewed: ? ? ? ?Assessment and Plan: ?Abdominal pain  suspected secondary to cholelithiasis ?Patient presented with complaints of abdominal pain with nausea and vomiting.  CT imaging gave concern for the possibility of acute cholecystitis.  Labs significant for

## 2022-03-02 NOTE — Progress Notes (Addendum)
Attending physician's note   I have taken a history, reviewed the chart, and examined the patient. I performed a substantive portion of this encounter, including complete performance of at least one of the key components, in conjunction with the APP. I agree with the APP's note, impression, and recommendations with my edits.   88 year old female with medical history as outlined below was found down today.  Has been having recent nausea/vomiting, unsteady gait, and possibly slurred speech per discussion with son and daughter-in-law in room today.  GI service consulted for elevated liver enzymes.  Liver enzymes normal as recently as 10/2021.  Admission CT with nonspecific caudate lobe liver enlargement, hepatic calcifications, hepatic steatosis, and GB wall thickening with pericholecystic edema.  No clear CDL.  - Will follow-up on ultrasound.  Not sure she would be able to fully cooperate with MRCP - Continue trending liver enzymes - Checking acute viral hepatitis studies - Trend CK - Decision of if/when for cholecystectomy per consulting Surgical service - Admission CT head without acute intracranial pathology.  Decision regarding MRI brain to primary Hospitalist service  April Stevenson, [place], [place] [phone] office                                                                                   [facility] Consult: 2:20 PM 03/02/2022  LOS: 0 days    Referring Provider: Dr. Adela Lank in ED. Primary Care Physician:  April Stevenson, April Citrin, NP   [facility] Primary Gastroenterologist: None    Reason for Consultation: Elevated LFTs   HPI: April Stevenson is a 88 y.o. female.  PMH Alzheimer's dementia.  Pulmonary hypertension.  GERD, HH.  Cholestatic hepatitis, listed as a problem, no details regarding this..  Hypothyroidism.  Nonoperative cervical spine dens fracture  05/2021.  CKD 3a.  Hypertension.  A of her medical care has been in [place].  Surgeries include hysterectomy, appendectomy, tonsillectomy, TAH/BSO.  Probably a remote colonoscopy decades ago.  No family recall of prior EGD.  Pt lives with her son and his wife.  Earlier in week underwent schedule ocular injection for wet mac degeneration treatment.    Yesterday morning she was queasy.  Threw up her breakfast.  Then family fed her bananas (mostly to help get her meds down), tea but she threw up again.  Said her stomach hurt.  No change in her mostly daily bowel habits of brown stool.  Observed to have a shuffling gait, not her typical gait.  Patient slept through the night.  This morning her son found her with her face down on the floor.  Son and daughter-in-law also report changes in how she eats.  She tends to suck foods that are more appropriately bitten and eaten.  No choking gagging observed.  She takes omeprazole 20 mg a day for acid reflux.  2 years ago she weighed 158 pounds and as of January 118 pounds.  No fever.  No hypotension or tachycardia.  Sats around 90% on room air T. bili 4.7.  Alkaline phosphatase 262.  AST/ALT 488/526.  Months ago LFTs were normal. GFR 41, this compares with a range of 40-44 in recent months. Na 134.  Ammonia 25.  CK elevated 847.  APAP <10.   WBC 17.9.  Hb 11.8.  MCV 94.  Platelets 144.  No coags obtained. Acute hep serologies pndg.    CTAP w contrast: Small left pleural effusion, trace right pleural fluid.  Mild cardiomegaly.  Small HH.  Gastric body wall thickening at least partially attributable to unders distention vs gastritis.  Presumed pericardial cyst.  Nonspecific caudate lobe liver enlargement.  Hepatic calcifications.  Probable hepatic steatosis.  Gallbladder wall thickening and pericholecystic edema.  Subtle gallstones with in gallbladder fundus.  Pancreas normal for age.  Subcentimeter hypoattenuating, nonspecific splenic lesions without splenomegaly.  8.7  cm rectal stool ball.  Ultrasound ordered  Surgery requesting GI opinion re LFTs.    Cared for at home by her son and daughter-in-law.  No EtOH, no cigarettes.    Past Medical History:  Diagnosis Date   Benign essential tremor 07/18/2018   Bilateral nonexudative age-related macular degeneration 07/18/2018   Cholestatic hepatitis    2001   DDD (degenerative disc disease), lumbar 04/23/2018   Depression    Dry eyes, bilateral 07/18/2018   Fatigue    GERD (gastroesophageal reflux disease)    Hiatal hernia with GERD 07/29/2011   Hoarding behavior 04/23/2018   HTN (hypertension) 07/29/2011   Hyperlipidemia    Hypertension    Hypothyroidism 07/29/2011   Insomnia    Late onset Alzheimer's disease with behavioral disturbance (HCC) 04/23/2018   LBBB (left bundle branch block) 07/29/2011   Major depressive disorder 07/29/2016   Memory impairment 12/09/2016   Memory loss    Osteoporosis    Paranoid delusion (HCC) 07/18/2018   Psychophysiological insomnia 04/23/2018   Pulmonary HTN (HCC) 03/15/2019   2 d echo 02/28/19 EF 55-60 %, mild to moderate mitral valve regurg, mild tricuspid regurg, severely elevated estimated right ventricular systolic pressure 85 mm Hg, and mild to moderate aortic valve regurg. No diastolic CHF noted.    Pure hypercholesterolemia 07/29/2011   Sciatica    Senile osteoporosis 04/23/2018    Past Surgical History:  Procedure Laterality Date   APPENDECTOMY     CARDIAC CATHETERIZATION     FACIAL COSMETIC SURGERY     TONSILLECTOMY     TOTAL ABDOMINAL HYSTERECTOMY W/ BILATERAL SALPINGOOPHORECTOMY      Prior to Admission medications   Medication Sig Start Date End Date Taking? Authorizing Provider  omeprazole (PRILOSEC) 20 MG capsule TAKE 1 CAPSULE(20 MG) BY MOUTH DAILY 02/04/22   April Stevenson, Dinah C, NP  calcium carbonate (OS-CAL) 600 MG TABS tablet Take 600 mg by mouth daily with breakfast.    [provider]  Cholecalciferol (D3 HIGH POTENCY) 2000 units CAPS Take 1  capsule (2,000 Units total) by mouth daily. 04/18/18   Reed, Tiffany L, DO  donepezil (ARICEPT) 10 MG tablet Take 0.5 tablets (5 mg total) by mouth at bedtime for 7 days. Then discontinue 11/04/21 11/11/21  April Stevenson, Dinah C, NP  FLUoxetine (PROZAC) 20 MG tablet TAKE 1 TABLET(20 MG) BY MOUTH DAILY 02/02/22   April Stevenson, Dinah C, NP  furosemide (LASIX) 20 MG tablet Take 1 tablet (20 mg total) by mouth daily. 11/04/21 02/02/22  April Stevenson, Dinah C, NP  hydrALAZINE (APRESOLINE) 25 MG tablet Take 1 tablet (25 mg total) by mouth every 8 (eight) hours. 11/15/21   April Stevenson, Dinah C, NP  irbesartan (AVAPRO) 300 MG tablet TAKE 1 TABLET(300 MG) BY MOUTH DAILY 01/31/22   April Stevenson, Dinah C, NP  levothyroxine (SYNTHROID) 25 MCG tablet TAKE 1 TABLET(25 MCG) BY MOUTH  AT BEDTIME 01/31/22   April Stevenson, Dinah C, NP  Melatonin 10 MG TABS Take 1 tablet by mouth at bedtime. 01/31/20   Reed, Tiffany L, DO  methocarbamol (ROBAXIN) 500 MG tablet Take 0.5 tablets (250 mg total) by mouth every 8 (eight) hours as needed for muscle spasms. 01/14/21   April Stevenson, Dinah C, NP  Multiple Vitamin (MULTIVITAMIN WITH MINERALS) TABS tablet Take 1 tablet by mouth in the morning and at bedtime.    [provider]  potassium chloride SA (KLOR-CON M) 20 MEQ tablet TAKE 1 TABLET BY MOUTH EVERY DAY 02/02/22   April Stevenson, Dinah C, NP  traZODone (DESYREL) 100 MG tablet Take 1 tablet (100 mg total) by mouth at bedtime. 11/04/21 02/02/22  April Stevenson, Dinah C, NP  traZODone (DESYREL) 50 MG tablet TAKE 1 TABLET(50 MG) BY MOUTH AT BEDTIME 02/02/22   April Stevenson, Dinah C, NP  vitamin C (ASCORBIC ACID) 500 MG tablet Take 1 tablet (500 mg total) by mouth daily. 07/18/18   Reed, Tiffany L, DO    Scheduled Meds:  Infusions:  cefTRIAXone (ROCEPHIN)  IV 2 g (03/02/22 1400)   metronidazole     PRN Meds:    Allergies as of 03/02/2022 - Review Complete 12/10/2021  Allergen Reaction Noted   Garlic  04/03/2014   Lac bovis  04/03/2014   Lanolin  12/23/2015   Penicillins   12/23/2015   Statins  10/05/2017    Family History  Problem Relation Age of Onset   Heart attack Father     Social History   Socioeconomic History   Marital status: Widowed    Spouse name: Nadine Counts   Number of children: 1   Years of education: some college, took courses as adult also   Highest education level: Some college, no degree  Occupational History   Occupation: model    Comment: [employer]   Occupation: singer  Tobacco Use   Smoking status: Former    Packs/day: 0.50    Years: 20.00    Pack years: 10.00    Types: Cigarettes   Smokeless tobacco: Never  Vaping Use   Vaping Use: Never used  Substance and Sexual Activity   Alcohol use: Yes    Alcohol/week: 1.0 standard drink    Types: 1 Glasses of wine per week    Comment: 5-6 times a month.   Drug use: Never   Sexual activity: Not Currently    Partners: Male  Other Topics Concern   Not on file  Social History Narrative   Was married three times and outlived all three husbands.   Social Determinants of Health   Financial Resource Strain: Not on file  Food Insecurity: Not on file  Transportation Needs: Not on file  Physical Activity: Not on file  Stress: Not on file  Social Connections: Not on file  Intimate Partner Violence: Not on file    REVIEW OF SYSTEMS: Constitutional: Normally is ambulatory. ENT:  No nose bleeds Pulm: No shortness of breath or cough CV:  No palpitations, no LE edema.  GU:  No hematuria, no frequency GI: See HPI. Heme: No excessive or unusual bleeding or bruising. Transfusions: None mentioned in chart. MS: wears c spine collar at night Neuro:  No headaches, no peripheral tingling or numbness.  No syncope, no seizures Derm:  No itching, no rash or sores.  Endocrine:  No sweats or chills.  No polyuria or dysuria Immunization: Reviewed. Travel:  None beyond local counties in last few months.  PHYSICAL EXAM: Vital signs in last 24 hours: Vitals:   03/02/22 1230  03/02/22 1330  BP: 108/64 (!) 144/69  Pulse: 64 71  Resp: 18 (!) 23  Temp:    SpO2: 90% 91%   Wt Readings from Last 3 Encounters:  03/02/22 59 kg  11/04/21 53.9 kg  05/21/21 64.1 kg    General: Patient comfortable and resting on stretcher in ED room with open mouth breathing.  Not responding to voice. Head: No facial asymmetry or swelling. Eyes: Erythema on the left conjunctiva.  No icterus Ears: Unable to assess because the patient is not responding to voice. Nose: No congestion or discharge Mouth: Slightly dry but clear oral mucosa.  Open mouth breathing. Neck: No JVD Lungs: No labored breathing.  Lungs clear bilaterally. Heart: RRR with soft murmur.  S1, S2 present Abdomen: Absolutely no tenderness.  No distention.  Active bowel sounds.  No HSM, masses, bruits, hernias appreciated..   Rectal: Deferred. Musc/Skeltl: No joint redness, swelling or gross deformity. Extremities: No CCE.  Feet are warm. Neurologic: Patient is somnolent.  Unable to assess orientation due to level of somnolence.  No observed tremors or gross deficits but not moving a lot.  Not following commands. Skin: No jaundice.  No open sores. Nodes: No cervical adenopathy Psych: Calm.  Intake/Output from previous day: No intake/output data recorded. Intake/Output this shift: Total I/O In: 999 [IV Piggyback:999] Out: -   LAB RESULTS: Recent Labs    03/02/22 1022  WBC 17.9*  HGB 11.8*  HCT 35.0*  PLT 144*   BMET Lab Results  Component Value Date   NA 134 (L) 03/02/2022   NA 138 11/04/2021   NA 135 05/25/2021   K 3.9 03/02/2022   K 4.5 11/04/2021   K 3.5 05/25/2021   CL 100 03/02/2022   CL 96 (L) 11/04/2021   CL 100 05/25/2021   CO2 25 03/02/2022   CO2 36 (H) 11/04/2021   CO2 28 05/25/2021   GLUCOSE 165 (H) 03/02/2022   GLUCOSE 108 (H) 11/04/2021   GLUCOSE 106 (H) 05/25/2021   BUN 22 03/02/2022   BUN 35 (H) 11/04/2021   BUN 19 05/25/2021   CREATININE 1.21 (H) 03/02/2022   CREATININE  1.33 (H) 11/04/2021   CREATININE 1.15 (H) 05/25/2021   CALCIUM 8.8 (L) 03/02/2022   CALCIUM 9.9 11/04/2021   CALCIUM 8.8 (L) 05/25/2021   LFT Recent Labs    03/02/22 1022  PROT 5.9*  ALBUMIN 2.9*  AST 488*  ALT 526*  ALKPHOS 262*  BILITOT 4.7*   PT/INR No results found for: INR, PROTIME Hepatitis Panel No results for input(s): HEPBSAG, HCVAB, HEPAIGM, HEPBIGM in the last 72 hours. C-Diff No components found for: CDIFF Lipase  No results found for: LIPASE  Drugs of Abuse  No results found for: LABOPIA, COCAINSCRNUR, LABBENZ, AMPHETMU, THCU, LABBARB   RADIOLOGY STUDIES: CT HEAD WO CONTRAST  Result Date: 03/02/2022 CLINICAL DATA:  Neck trauma (Age >= 65y); Head trauma, minor (Age >= 65y). History of dens fracture. EXAM: CT HEAD WITHOUT CONTRAST CT CERVICAL SPINE WITHOUT CONTRAST TECHNIQUE: Multidetector CT imaging of the head and cervical spine was performed following the standard protocol without intravenous contrast. Multiplanar CT image reconstructions of the cervical spine were also generated. RADIATION DOSE REDUCTION: This exam was performed according to the departmental dose-optimization program which includes automated exposure control, adjustment of the mA and/or kV according to patient size and/or use of iterative reconstruction technique. COMPARISON:  05/21/2021 FINDINGS: CT HEAD FINDINGS Brain:  No evidence of acute infarction, hemorrhage, hydrocephalus, extra-axial collection or mass lesion/mass effect. Scattered low-density changes within the periventricular and subcortical white matter compatible with chronic microvascular ischemic change. Mild diffuse cerebral volume loss. Vascular: No hyperdense vessel or unexpected calcification. Skull: Normal. Negative for fracture or focal lesion. Sinuses/Orbits: No acute finding. Other: Negative for scalp hematoma. CT CERVICAL SPINE FINDINGS Alignment: Unchanged mild posterior subluxation of C1 relative to C2. Unchanged grade 1  anterolisthesis of C3 on C4. No facet dislocation. Skull base and vertebrae: Chronic ununited type 2 dens fracture. The dens remains mildly displaced posteriorly relative to the body of C2, unchanged. No new or acute fractures of the cervical spine. No focal lytic or sclerotic bone lesion. Soft tissues and spinal canal: No prevertebral fluid or swelling. No visible canal hematoma. Disc levels: Degenerative disc disease most pronounced at C5-6. Multilevel bilateral facet arthropathy, left worse than right. Progressive arthropathy at the C1-2 articulation. Upper chest: Negative. Other: None. IMPRESSION: 1. No acute intracranial abnormality. 2. No acute cervical spine fracture. 3. Chronic ununited type 2 dens fracture, unchanged from prior. 4. Chronic microvascular ischemic change and cerebral volume loss. Electronically Signed   By: Duanne Guess D.O.   On: 03/02/2022 11:42   CT CERVICAL SPINE WO CONTRAST  Result Date: 03/02/2022 CLINICAL DATA:  Neck trauma (Age >= 65y); Head trauma, minor (Age >= 65y). History of dens fracture. EXAM: CT HEAD WITHOUT CONTRAST CT CERVICAL SPINE WITHOUT CONTRAST TECHNIQUE: Multidetector CT imaging of the head and cervical spine was performed following the standard protocol without intravenous contrast. Multiplanar CT image reconstructions of the cervical spine were also generated. RADIATION DOSE REDUCTION: This exam was performed according to the departmental dose-optimization program which includes automated exposure control, adjustment of the mA and/or kV according to patient size and/or use of iterative reconstruction technique. COMPARISON:  05/21/2021 FINDINGS: CT HEAD FINDINGS Brain: No evidence of acute infarction, hemorrhage, hydrocephalus, extra-axial collection or mass lesion/mass effect. Scattered low-density changes within the periventricular and subcortical white matter compatible with chronic microvascular ischemic change. Mild diffuse cerebral volume loss. Vascular:  No hyperdense vessel or unexpected calcification. Skull: Normal. Negative for fracture or focal lesion. Sinuses/Orbits: No acute finding. Other: Negative for scalp hematoma. CT CERVICAL SPINE FINDINGS Alignment: Unchanged mild posterior subluxation of C1 relative to C2. Unchanged grade 1 anterolisthesis of C3 on C4. No facet dislocation. Skull base and vertebrae: Chronic ununited type 2 dens fracture. The dens remains mildly displaced posteriorly relative to the body of C2, unchanged. No new or acute fractures of the cervical spine. No focal lytic or sclerotic bone lesion. Soft tissues and spinal canal: No prevertebral fluid or swelling. No visible canal hematoma. Disc levels: Degenerative disc disease most pronounced at C5-6. Multilevel bilateral facet arthropathy, left worse than right. Progressive arthropathy at the C1-2 articulation. Upper chest: Negative. Other: None. IMPRESSION: 1. No acute intracranial abnormality. 2. No acute cervical spine fracture. 3. Chronic ununited type 2 dens fracture, unchanged from prior. 4. Chronic microvascular ischemic change and cerebral volume loss. Electronically Signed   By: Duanne Guess D.O.   On: 03/02/2022 11:42   CT ABDOMEN PELVIS W CONTRAST  Result Date: 03/02/2022 CLINICAL DATA:  acute hepatitis.  Fall. EXAM: CT ABDOMEN AND PELVIS WITH CONTRAST TECHNIQUE: Multidetector CT imaging of the abdomen and pelvis was performed using the standard protocol following bolus administration of intravenous contrast. RADIATION DOSE REDUCTION: This exam was performed according to the departmental dose-optimization program which includes automated exposure control, adjustment of the mA and/or  kV according to patient size and/or use of iterative reconstruction technique. CONTRAST:  65mL OMNIPAQUE IOHEXOL 350 MG/ML SOLN COMPARISON:  None Available. FINDINGS: Lower chest: Lingular scarring or atelectasis. Dependent left lower lobe airspace disease. Small left pleural effusion. Trace  right pleural fluid. Mild cardiomegaly. Small hiatal hernia. Presumed pericardial cyst of 4.6 cm in the right cardiophrenic angle. Hepatobiliary: Nonspecific caudate lobe enlargement. Hepatic calcifications which are likely related to prior infection or inflammation. Probable hepatic steatosis. Subtle gallstones within the fundus. Gallbladder wall thickening and pericholecystic edema including on 31/3. No biliary duct dilatation. Pancreas: Normal pancreas for age. Spleen: Subcentimeter hypoattenuating splenic lesions are nonspecific but of doubtful clinical significance. No splenomegaly. Adrenals/Urinary Tract: Normal adrenal glands. Too small to characterize interpolar left renal lesion. Normal right kidney for age. No hydronephrosis. Normal urinary bladder. Stomach/Bowel: Gastric body underdistention. Rectal stool ball of 8.7 cm. Normal small bowel. Vascular/Lymphatic: Advanced aortic and branch vessel atherosclerosis. No abdominopelvic adenopathy. Reproductive: Hysterectomy.  No adnexal mass. Other: No significant free fluid.  No free intraperitoneal air. Musculoskeletal: Lumbar spondylosis with S-shaped thoracolumbar spine curvature. IMPRESSION: 1. No posttraumatic deformity identified. 2. Findings highly suspicious for acute cholecystitis. 3. Rectal stool ball suggests constipation or fecal impaction. 4. Small hiatal hernia. Gastric body apparent wall thickening is at least partially felt to be due to underdistention. Correlate with symptoms of gastritis. 5. Small left and trace right pleural effusions with dependent left base airspace disease. Suspicious for mild pneumonia or aspiration with compressive atelectasis felt less likely. 6. Probable hepatic steatosis. Electronically Signed   By: Jeronimo Greaves M.D.   On: 03/02/2022 13:36   DG Chest Port 1 View  Result Date: 03/02/2022 CLINICAL DATA:  88 year old female with history of altered mental status. EXAM: PORTABLE CHEST 1 VIEW COMPARISON:  Chest x-ray  05/21/2021. FINDINGS: Bibasilar (left-greater-than-right) opacities indicative of atelectasis and/or consolidation. Small left pleural effusion. No definite right pleural effusion. No pneumothorax. No evidence of pulmonary edema. Heart size is mildly enlarged. Upper mediastinal contours are within normal limits. Atherosclerotic calcifications are noted in the thoracic aorta. IMPRESSION: 1. Bibasilar areas of atelectasis and/or consolidation. 2. Small left pleural effusion. 3. Mild cardiomegaly. 4. Aortic atherosclerosis. Electronically Signed   By: Trudie Reed M.D.   On: 03/02/2022 11:12      IMPRESSION:   Acute onset of nonbloody emesis along with complaint of abdominal pain within the last 36 hours.  LFTs elevated.  CT concerning for acute cholecystitis.  No biliary ductal dilatation or suggestion of choledocholithiasis or biliary ductal obstruction.  Propable fatty liver noted on CT.    Found down at home this morning, unclear duration of down time.  Could there be an element of liver injury related to rhabdo as CK is elevated though BUN/creat not far from baseline?    Stool ball in rectum on CT. patient caregivers not aware of problems with constipation or diarrhea.  Pleural effusions and dependent left base airspace disease suspicious for mild pneumonia.  Aspiration/compressive atelectasis felt less likely.  Has received a dose of Rocephin, Flagyl..  Gastric wall thickening per CT, rule out gastritis.  Family relays history of GERD well-controlled with daily omeprazole.  Thrombocytopenia.  Advanced Alzheimer's dementia.  Wet macular degeneration.  Earlier this week underwent therapeutic medication injection to eye.     PLAN:     Await findings on ultrasound.  May be necessary to attempt pursuing MRCP if ultrasound is not helpful in delineating ductal dilatation, choledocholithiasis.  Follow CMET.  Obtain PT/INR, CK  Acute hepatitis serologies have been ordered.   Jennye Moccasin   03/02/2022, 2:20 PM Phone [phone]

## 2022-03-02 NOTE — ED Triage Notes (Signed)
Pt BIB EMS due to a fall from home. Pt lives with son and was found face down.Family states she did not do her routine last night which was odd for her. Pt has hx of alzheimer. VSS. Axox2.  ?

## 2022-03-03 ENCOUNTER — Inpatient Hospital Stay (HOSPITAL_COMMUNITY): Payer: Medicare Other

## 2022-03-03 DIAGNOSIS — R7989 Other specified abnormal findings of blood chemistry: Secondary | ICD-10-CM

## 2022-03-03 DIAGNOSIS — K810 Acute cholecystitis: Secondary | ICD-10-CM | POA: Diagnosis not present

## 2022-03-03 DIAGNOSIS — K8020 Calculus of gallbladder without cholecystitis without obstruction: Secondary | ICD-10-CM | POA: Diagnosis not present

## 2022-03-03 LAB — COMPREHENSIVE METABOLIC PANEL
ALT: 297 U/L — ABNORMAL HIGH (ref 0–44)
AST: 191 U/L — ABNORMAL HIGH (ref 15–41)
Albumin: 2.4 g/dL — ABNORMAL LOW (ref 3.5–5.0)
Alkaline Phosphatase: 193 U/L — ABNORMAL HIGH (ref 38–126)
Anion gap: 5 (ref 5–15)
BUN: 22 mg/dL (ref 8–23)
CO2: 27 mmol/L (ref 22–32)
Calcium: 8.4 mg/dL — ABNORMAL LOW (ref 8.9–10.3)
Chloride: 107 mmol/L (ref 98–111)
Creatinine, Ser: 1.1 mg/dL — ABNORMAL HIGH (ref 0.44–1.00)
GFR, Estimated: 46 mL/min — ABNORMAL LOW (ref >60)
Glucose, Bld: 92 mg/dL (ref 70–99)
Potassium: 3.5 mmol/L (ref 3.5–5.1)
Sodium: 139 mmol/L (ref 135–145)
Total Bilirubin: 3.1 mg/dL — ABNORMAL HIGH (ref 0.3–1.2)
Total Protein: 4.8 g/dL — ABNORMAL LOW (ref 6.5–8.1)

## 2022-03-03 LAB — CBC
HCT: 29.6 % — ABNORMAL LOW (ref 36.0–46.0)
Hemoglobin: 10.3 g/dL — ABNORMAL LOW (ref 12.0–15.0)
MCH: 32.5 pg (ref 26.0–34.0)
MCHC: 34.8 g/dL (ref 30.0–36.0)
MCV: 93.4 fL (ref 80.0–100.0)
Platelets: 106 10*3/uL — ABNORMAL LOW (ref 150–400)
RBC: 3.17 MIL/uL — ABNORMAL LOW (ref 3.87–5.11)
RDW: 13.2 % (ref 11.5–15.5)
WBC: 10.5 10*3/uL (ref 4.0–10.5)
nRBC: 0 % (ref 0.0–0.2)

## 2022-03-03 IMAGING — NM NM HEPATOBILIARY IMAGE, INC GB
2 series · 12 of 12 positions shown · non-contrast
Comparison: Ultrasound from the previous day.

CLINICAL DATA: Dilated gallbladder with wall thickening and
cholelithiasis on recent ultrasound, evaluate for cystic duct
patency.

EXAM:
NUCLEAR MEDICINE HEPATOBILIARY IMAGING
TECHNIQUE: Sequential images of the abdomen were obtained [DATE] minutes
following intravenous administration of radiopharmaceutical.
RADIOPHARMACEUTICALS:  7.1 mCi [M0]  Choletec IV

[he hepatobiliary · 4.52mm/px · 6 of 51 frames shown (1 of 2)]
[frame 5/51]
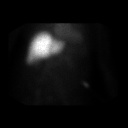
[frame 13/51]
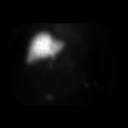
[frame 22/51]
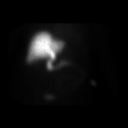
[frame 30/51]
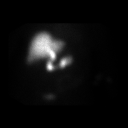
[frame 39/51]
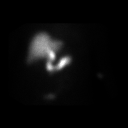
[frame 47/51]
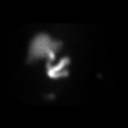

[he hepatobiliary · 4.52mm/px · 6 of 30 frames shown (2 of 2)]
[frame 3/30]
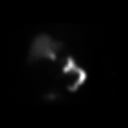
[frame 8/30]
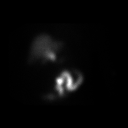
[frame 13/30]
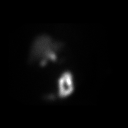
[frame 18/30]
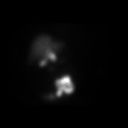
[frame 23/30]
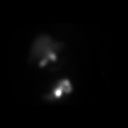
[frame 28/30]
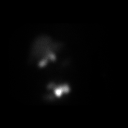

[12 of 12 positions shown; findings below may reference images not displayed]

FINDINGS: Prompt uptake and biliary excretion of activity by the liver is
seen. Biliary activity passes into small bowel, consistent with
patent common bile duct. Gallbladder is not visualized. 2.3 mg of
morphine was then administered in an effort to shunt biliary tracer
into the gallbladder. Delayed images demonstrate visualization of
the gallbladder consistent with cystic duct patency.
IMPRESSION: Normal uptake and excretion of biliary tracer.

Initial nonvisualization of the gallbladder with subsequent
visualization following morphine administration indicating cystic
duct patency.

## 2022-03-03 MED ORDER — LACTATED RINGERS IV SOLN: INTRAVENOUS | Status: DC

## 2022-03-03 MED ORDER — ADULT MULTIVITAMIN W/MINERALS CH
1.0000 | ORAL_TABLET | Freq: Every day | ORAL | Status: DC
  Administered 2022-03-04 – 2022-03-05 (×2): 1 via ORAL
  Filled 2022-03-03 (×2): qty 1

## 2022-03-03 MED ORDER — MORPHINE SULFATE (PF) 4 MG/ML IV SOLN
INTRAVENOUS | Status: AC
  Filled 2022-03-03: qty 1

## 2022-03-03 MED ORDER — MORPHINE SULFATE (PF) 4 MG/ML IV SOLN
2.3000 mg | Freq: Once | INTRAVENOUS | Status: AC
  Administered 2022-03-03: 2.3 mg via INTRAVENOUS

## 2022-03-03 MED ORDER — TECHNETIUM TC 99M MEBROFENIN IV KIT
7.1000 | PACK | Freq: Once | INTRAVENOUS | Status: AC | PRN
  Administered 2022-03-03: 7.1 via INTRAVENOUS

## 2022-03-03 NOTE — Evaluation (Signed)
Clinical/Bedside Swallow Evaluation Patient Details  Name: April Stevenson MRN: [id number] Date of Birth: [birth year]  Today's Date: 03/03/2022 Time: SLP Start Time (ACUTE ONLY): 2536 SLP Stop Time (ACUTE ONLY): 6440 SLP Time Calculation (min) (ACUTE ONLY): 15 min  Past Medical History:  Past Medical History:  Diagnosis Date   Benign essential tremor 07/18/2018   Bilateral nonexudative age-related macular degeneration 07/18/2018   Cholestatic hepatitis    2001   DDD (degenerative disc disease), lumbar 04/23/2018   Depression    Dry eyes, bilateral 07/18/2018   Fatigue    GERD (gastroesophageal reflux disease)    Hiatal hernia with GERD 07/29/2011   Hoarding behavior 04/23/2018   HTN (hypertension) 07/29/2011   Hyperlipidemia    Hypertension    Hypothyroidism 07/29/2011   Insomnia    Late onset Alzheimer's disease with behavioral disturbance ([place]) 04/23/2018   LBBB (left bundle branch block) 07/29/2011   Major depressive disorder 07/29/2016   Memory impairment 12/09/2016   Memory loss    Osteoporosis    Paranoid delusion ([place]) 07/18/2018   Psychophysiological insomnia 04/23/2018   Pulmonary HTN ([place]) 03/15/2019   2 d echo 02/28/19 EF 55-60 %, mild to moderate mitral valve regurg, mild tricuspid regurg, severely elevated estimated right ventricular systolic pressure 85 mm Hg, and mild to moderate aortic valve regurg. No diastolic CHF noted.    Pure hypercholesterolemia 07/29/2011   Sciatica    Senile osteoporosis 04/23/2018   Past Surgical History:  Past Surgical History:  Procedure Laterality Date   APPENDECTOMY     CARDIAC CATHETERIZATION     FACIAL COSMETIC SURGERY     TONSILLECTOMY     TOTAL ABDOMINAL HYSTERECTOMY W/ BILATERAL SALPINGOOPHORECTOMY     HPI:  Pt is a 88 yo female presenting 5/17 after being found down. Pt with some abdominal pain and emesis PTA. CT Abdomen concerning for cholelithiasis and mild PNA. PMH includes: dementia, GERD, HH, HTN, hypothyroidism,  nonoperative cervical spine dens fx  05/2021, CKD    Assessment / Plan / Recommendation  Clinical Impression  Pt's oropharyngeal swallow appears to be grossly functional as can be observed clinically.  Cognitive status could increase her risk for episodic aspiration, although if there is concern for current PNA to be related to aspiration, suspect that this could have more likely occurred during instances of vomiting. Pt does have several risk factors for episodic aspiration and risk for dysphagia related aspiration events, including assistance needed for feeding, cognitive impairments, and acute deconditioning. Therefore, would continue current diet wtih careful assistance during meals and adherence to aspiration precautions. SLP to sign off but please reorder with any acute concerns. SLP Visit Diagnosis: Dysphagia, unspecified (R13.10)    Aspiration Risk  Mild aspiration risk;Risk for inadequate nutrition/hydration    Diet Recommendation Regular;Thin liquid   Liquid Administration via: Cup;Straw Medication Administration: Whole meds with liquid Supervision: Staff to assist with self feeding Compensations: Minimize environmental distractions;Slow rate;Small sips/bites Postural Changes: Seated upright at 90 degrees;Remain upright for at least 30 minutes after po intake    Other  Recommendations Oral Care Recommendations: Oral care BID    Recommendations for follow up therapy are one component of a multi-disciplinary discharge planning process, led by the attending physician.  Recommendations may be updated based on patient status, additional functional criteria and insurance authorization.  Follow up Recommendations No SLP follow up      Assistance Recommended at Discharge Frequent or constant Supervision/Assistance  Functional Status Assessment Patient has not had a recent decline in their  functional status  Frequency and Duration            Prognosis        Swallow Study    General HPI: Pt is a 88 yo female presenting 5/17 after being found down. Pt with some abdominal pain and emesis PTA. CT Abdomen concerning for cholelithiasis and mild PNA. PMH includes: dementia, GERD, HH, HTN, hypothyroidism, nonoperative cervical spine dens fx  05/2021, CKD Type of Study: Bedside Swallow Evaluation Previous Swallow Assessment: none in chart Diet Prior to this Study: Regular;Thin liquids Temperature Spikes Noted: No Respiratory Status: Nasal cannula History of Recent Intubation: No Behavior/Cognition: Alert;Cooperative;Pleasant mood Oral Cavity Assessment: Dry Oral Care Completed by SLP: No Oral Cavity - Dentition: Adequate natural dentition Vision: Functional for self-feeding Self-Feeding Abilities: Total assist Patient Positioning: Upright in bed Baseline Vocal Quality: Normal Volitional Cough: Strong Volitional Swallow: Able to elicit    Oral/Motor/Sensory Function Overall Oral Motor/Sensory Function: Within functional limits   Ice Chips Ice chips: Not tested   Thin Liquid Thin Liquid: Within functional limits Presentation: Straw    Nectar Thick Nectar Thick Liquid: Not tested   Honey Thick Honey Thick Liquid: Not tested   Puree Puree: Within functional limits Presentation: Spoon   Solid     Solid: Within functional limits      Osie Bond., M.A. [place] Office [phone]  Secure chat preferred  03/03/2022,8:58 AM

## 2022-03-03 NOTE — Progress Notes (Signed)
Subjective: CC: Reports no abdominal pain yesterday.  Apparently started developing some upper abdominal pain on the right side this morning that is mild.  Had some crackers and liquids this AM.  Not sure if pain started before or after and if pain was worse after p.o. intake.  Denies nausea or vomiting.  Objective: Vital signs in last 24 hours: Temp:  [97.6 F (36.4 C)-98.7 F (37.1 C)] 98.3 F (36.8 C) (05/18 0758) Pulse Rate:  [57-87] 59 (05/18 0758) Resp:  [10-23] 17 (05/18 0758) BP: (108-160)/(52-107) 158/64 (05/18 0758) SpO2:  [89 %-99 %] 96 % (05/18 0758) Weight:  [58 kg-60 kg] 58 kg (05/18 0500)    Intake/Output from previous day: 05/17 0701 - 05/18 0700 In: 1199.1 [IV Piggyback:1199.1] Out: -  Intake/Output this shift: No intake/output data recorded.  PE: Gen:  Alert, NAD, pleasant, frail elderly female Pulm:  Normal rate and effort  Abd: Soft, ND, some RUQ tenderness today, +BS Psych: A&Ox3   Lab Results:  Recent Labs    03/02/22 1022 03/03/22 0047  WBC 17.9* 10.5  HGB 11.8* 10.3*  HCT 35.0* 29.6*  PLT 144* 106*   BMET Recent Labs    03/02/22 1642 03/03/22 0047  NA 139 139  K 4.0 3.5  CL 105 107  CO2 25 27  GLUCOSE 107* 92  BUN 20 22  CREATININE 1.16* 1.10*  CALCIUM 8.7* 8.4*   PT/INR Recent Labs    03/02/22 1642  LABPROT 20.4*  INR 1.8*   CMP     Component Value Date/Time   NA 139 03/03/2022 0047   NA 139 03/05/2019 0600   K 3.5 03/03/2022 0047   CL 107 03/03/2022 0047   CO2 27 03/03/2022 0047   GLUCOSE 92 03/03/2022 0047   BUN 22 03/03/2022 0047   BUN 26 (A) 03/05/2019 0600   CREATININE 1.10 (H) 03/03/2022 0047   CREATININE 1.33 (H) 11/04/2021 1009   CALCIUM 8.4 (L) 03/03/2022 0047   PROT 4.8 (L) 03/03/2022 0047   ALBUMIN 2.4 (L) 03/03/2022 0047   AST 191 (H) 03/03/2022 0047   ALT 297 (H) 03/03/2022 0047   ALKPHOS 193 (H) 03/03/2022 0047   BILITOT 3.1 (H) 03/03/2022 0047   GFRNONAA 46 (L) 03/03/2022 0047    GFRNONAA 35 (L) 01/14/2021 1228   GFRAA 41 (L) 01/14/2021 1228   Lipase  No results found for: LIPASE  Studies/Results: CT HEAD WO CONTRAST  Result Date: 03/02/2022 CLINICAL DATA:  Neck trauma (Age >= 65y); Head trauma, minor (Age >= 65y). History of dens fracture. EXAM: CT HEAD WITHOUT CONTRAST CT CERVICAL SPINE WITHOUT CONTRAST TECHNIQUE: Multidetector CT imaging of the head and cervical spine was performed following the standard protocol without intravenous contrast. Multiplanar CT image reconstructions of the cervical spine were also generated. RADIATION DOSE REDUCTION: This exam was performed according to the departmental dose-optimization program which includes automated exposure control, adjustment of the mA and/or kV according to patient size and/or use of iterative reconstruction technique. COMPARISON:  05/21/2021 FINDINGS: CT HEAD FINDINGS Brain: No evidence of acute infarction, hemorrhage, hydrocephalus, extra-axial collection or mass lesion/mass effect. Scattered low-density changes within the periventricular and subcortical white matter compatible with chronic microvascular ischemic change. Mild diffuse cerebral volume loss. Vascular: No hyperdense vessel or unexpected calcification. Skull: Normal. Negative for fracture or focal lesion. Sinuses/Orbits: No acute finding. Other: Negative for scalp hematoma. CT CERVICAL SPINE FINDINGS Alignment: Unchanged mild posterior subluxation of C1 relative to C2. Unchanged grade 1 anterolisthesis  of C3 on C4. No facet dislocation. Skull base and vertebrae: Chronic ununited type 2 dens fracture. The dens remains mildly displaced posteriorly relative to the body of C2, unchanged. No new or acute fractures of the cervical spine. No focal lytic or sclerotic bone lesion. Soft tissues and spinal canal: No prevertebral fluid or swelling. No visible canal hematoma. Disc levels: Degenerative disc disease most pronounced at C5-6. Multilevel bilateral facet  arthropathy, left worse than right. Progressive arthropathy at the C1-2 articulation. Upper chest: Negative. Other: None. IMPRESSION: 1. No acute intracranial abnormality. 2. No acute cervical spine fracture. 3. Chronic ununited type 2 dens fracture, unchanged from prior. 4. Chronic microvascular ischemic change and cerebral volume loss. Electronically Signed   By: Davina Poke D.O.   On: 03/02/2022 11:42   CT CERVICAL SPINE WO CONTRAST  Result Date: 03/02/2022 CLINICAL DATA:  Neck trauma (Age >= 65y); Head trauma, minor (Age >= 65y). History of dens fracture. EXAM: CT HEAD WITHOUT CONTRAST CT CERVICAL SPINE WITHOUT CONTRAST TECHNIQUE: Multidetector CT imaging of the head and cervical spine was performed following the standard protocol without intravenous contrast. Multiplanar CT image reconstructions of the cervical spine were also generated. RADIATION DOSE REDUCTION: This exam was performed according to the departmental dose-optimization program which includes automated exposure control, adjustment of the mA and/or kV according to patient size and/or use of iterative reconstruction technique. COMPARISON:  05/21/2021 FINDINGS: CT HEAD FINDINGS Brain: No evidence of acute infarction, hemorrhage, hydrocephalus, extra-axial collection or mass lesion/mass effect. Scattered low-density changes within the periventricular and subcortical white matter compatible with chronic microvascular ischemic change. Mild diffuse cerebral volume loss. Vascular: No hyperdense vessel or unexpected calcification. Skull: Normal. Negative for fracture or focal lesion. Sinuses/Orbits: No acute finding. Other: Negative for scalp hematoma. CT CERVICAL SPINE FINDINGS Alignment: Unchanged mild posterior subluxation of C1 relative to C2. Unchanged grade 1 anterolisthesis of C3 on C4. No facet dislocation. Skull base and vertebrae: Chronic ununited type 2 dens fracture. The dens remains mildly displaced posteriorly relative to the body of  C2, unchanged. No new or acute fractures of the cervical spine. No focal lytic or sclerotic bone lesion. Soft tissues and spinal canal: No prevertebral fluid or swelling. No visible canal hematoma. Disc levels: Degenerative disc disease most pronounced at C5-6. Multilevel bilateral facet arthropathy, left worse than right. Progressive arthropathy at the C1-2 articulation. Upper chest: Negative. Other: None. IMPRESSION: 1. No acute intracranial abnormality. 2. No acute cervical spine fracture. 3. Chronic ununited type 2 dens fracture, unchanged from prior. 4. Chronic microvascular ischemic change and cerebral volume loss. Electronically Signed   By: Davina Poke D.O.   On: 03/02/2022 11:42   CT ABDOMEN PELVIS W CONTRAST  Result Date: 03/02/2022 CLINICAL DATA:  acute hepatitis.  Fall. EXAM: CT ABDOMEN AND PELVIS WITH CONTRAST TECHNIQUE: Multidetector CT imaging of the abdomen and pelvis was performed using the standard protocol following bolus administration of intravenous contrast. RADIATION DOSE REDUCTION: This exam was performed according to the departmental dose-optimization program which includes automated exposure control, adjustment of the mA and/or kV according to patient size and/or use of iterative reconstruction technique. CONTRAST:  4m OMNIPAQUE IOHEXOL 350 MG/ML SOLN COMPARISON:  None Available. FINDINGS: Lower chest: Lingular scarring or atelectasis. Dependent left lower lobe airspace disease. Small left pleural effusion. Trace right pleural fluid. Mild cardiomegaly. Small hiatal hernia. Presumed pericardial cyst of 4.6 cm in the right cardiophrenic angle. Hepatobiliary: Nonspecific caudate lobe enlargement. Hepatic calcifications which are likely related to prior infection or inflammation.  Probable hepatic steatosis. Subtle gallstones within the fundus. Gallbladder wall thickening and pericholecystic edema including on 31/3. No biliary duct dilatation. Pancreas: Normal pancreas for age.  Spleen: Subcentimeter hypoattenuating splenic lesions are nonspecific but of doubtful clinical significance. No splenomegaly. Adrenals/Urinary Tract: Normal adrenal glands. Too small to characterize interpolar left renal lesion. Normal right kidney for age. No hydronephrosis. Normal urinary bladder. Stomach/Bowel: Gastric body underdistention. Rectal stool ball of 8.7 cm. Normal small bowel. Vascular/Lymphatic: Advanced aortic and branch vessel atherosclerosis. No abdominopelvic adenopathy. Reproductive: Hysterectomy.  No adnexal mass. Other: No significant free fluid.  No free intraperitoneal air. Musculoskeletal: Lumbar spondylosis with S-shaped thoracolumbar spine curvature. IMPRESSION: 1. No posttraumatic deformity identified. 2. Findings highly suspicious for acute cholecystitis. 3. Rectal stool ball suggests constipation or fecal impaction. 4. Small hiatal hernia. Gastric body apparent wall thickening is at least partially felt to be due to underdistention. Correlate with symptoms of gastritis. 5. Small left and trace right pleural effusions with dependent left base airspace disease. Suspicious for mild pneumonia or aspiration with compressive atelectasis felt less likely. 6. Probable hepatic steatosis. Electronically Signed   By: Abigail Miyamoto M.D.   On: 03/02/2022 13:36   DG Chest Port 1 View  Result Date: 03/02/2022 CLINICAL DATA:  88 year old female with history of altered mental status. EXAM: PORTABLE CHEST 1 VIEW COMPARISON:  Chest x-ray 05/21/2021. FINDINGS: Bibasilar (left-greater-than-right) opacities indicative of atelectasis and/or consolidation. Small left pleural effusion. No definite right pleural effusion. No pneumothorax. No evidence of pulmonary edema. Heart size is mildly enlarged. Upper mediastinal contours are within normal limits. Atherosclerotic calcifications are noted in the thoracic aorta. IMPRESSION: 1. Bibasilar areas of atelectasis and/or consolidation. 2. Small left pleural  effusion. 3. Mild cardiomegaly. 4. Aortic atherosclerosis. Electronically Signed   By: Vinnie Langton M.D.   On: 03/02/2022 11:12   US Abdomen Limited RUQ (LIVER/GB)  Result Date: 03/02/2022 CLINICAL DATA:  Right upper quadrant pain. EXAM: ULTRASOUND ABDOMEN LIMITED RIGHT UPPER QUADRANT COMPARISON:  CT abdomen and pelvis 03/02/2022 FINDINGS: Gallbladder: Gallstones are present. Multiple small layering gallstones are present measuring up to 3 mm. There is gallbladder wall thickening measuring 4 mm. Sonographic Percell Miller sign is negative. Common bile duct: Diameter: 7 mm.  (Within normal limits). Liver: There is a 10 x 8 x 8 mm hypoechoic mass in the right lobe of the liver without vascularity. Within normal limits in parenchymal echogenicity. Portal vein is patent on color Doppler imaging with normal direction of blood flow towards the liver. Other: None. IMPRESSION: 1. Cholelithiasis with gallbladder wall thickening. Negative sonographic Murphy sign. No biliary ductal dilatation. Findings may related to acute or chronic cholecystitis. Correlate clinically. 2. 1 cm hypoechoic mass in the right lobe of the liver favored as hemangioma. Electronically Signed   By: Ronney Asters M.D.   On: 03/02/2022 17:07    Anti-infectives: Anti-infectives (From admission, onward)    Start     Dose/Rate Route Frequency Ordered Stop   03/03/22 1200  cefTRIAXone (ROCEPHIN) 2 g in sodium chloride 0.9 % 100 mL IVPB        2 g 200 mL/hr over 30 Minutes Intravenous Every 24 hours 03/02/22 1546     03/02/22 1245  cefTRIAXone (ROCEPHIN) 2 g in sodium chloride 0.9 % 100 mL IVPB        2 g 200 mL/hr over 30 Minutes Intravenous  Once 03/02/22 1238 03/02/22 1437   03/02/22 1245  metroNIDAZOLE (FLAGYL) IVPB 500 mg        500 mg  100 mL/hr over 60 Minutes Intravenous Every 12 hours 03/02/22 1238          Assessment/Plan Cholelithiasis Elevated LFT's - RUQ [place] yesterday with cholelithiasis with gallbladder wall thickening.   WBC has normalized.  LFTs downtrending -Patient does have some RUQ pain that began today as well as RUQ ttp on exam  - Will get HIDA to r/o Acute Cholecystitis - Patient would like to avoid surgery if at all possible.  If HIDA is positive would recommend treatment with IV antibiotics +/- Perc Chole.  - GI following for elevated LFT's workup as well - We will follow with you   ID - rocephin/flagyl VTE - SCDs/ per primary FEN - IVF, NPO Foley - none   B/l Pleural effusions  ? PNA Elevated CK Elevated Cr HTN Hypothyroidism GERD Severe Alzheimer's Dementia    LOS: 1 day    Jillyn Ledger , [facility] 03/03/2022, 9:40 AM Please see Amion for pager number during day hours 7:00am-4:30pm

## 2022-03-03 NOTE — Evaluation (Signed)
Physical Therapy Evaluation  Patient Details Name: April Stevenson MRN: [id number] DOB: 18-Jan-1924 Today's Date: 03/03/2022  History of Present Illness  Pt is a 88 y/o female who presents via EMS s/p fall at home. She also reported nausea, vomiting and abdominal pain. CT scan of the head and cervical spine did not note any acute abnormalities, but did note a chronic ununited type II dens fracture unchanged from prior imaging from 05/2021. Son reports pt sleeps in a cervical collar but does not wear it during the day. Chest x-ray noted bibasilar atelectasis/consolidation with small left-sided pleural effusion and mild cardiomegaly. CT scan of the abdomen pelvis noted findings highly suspicious for acute cholecystitis, rectal stool ball suggesting constipation or fecal impaction, small hiatal hernia. PMH significant for benign essential tremor, macular degeneration, cholestatic hepatitis, DDD, HTN, hypothyroidism, Alzheimer's Disease with dementia, LBBB, osteoporosis, pulmonary HTN.  Clinical Impression  Pt admitted with above diagnosis. Pt currently with functional limitations due to the deficits listed below (see PT Problem List). At the time of PT eval pt was able to perform transfers and ambulation with gross min assist and HHA for support. Son present and states the pt uses her RW ~90% of the time, however appeared confused when I placed the RW in front of her, and she pushed it away as to try and walk past it. The pt did well with HHA and will issue a gait belt to the son for safety. Son reports that they have good support at home with several personal care attendants helping out. Feel this patient would benefit from return to her home environment, and she is likely near baseline of function. Acutely, pt will benefit from skilled PT to increase their independence and safety with mobility to allow discharge to the venue listed below.          Recommendations for follow up therapy are one  component of a multi-disciplinary discharge planning process, led by the attending physician.  Recommendations may be updated based on patient status, additional functional criteria and insurance authorization.  Follow Up Recommendations Home health PT    Assistance Recommended at Discharge Intermittent Supervision/Assistance  Patient can return home with the following  A little help with walking and/or transfers;A little help with bathing/dressing/bathroom;Assistance with cooking/housework;Assist for transportation;Help with stairs or ramp for entrance    Equipment Recommendations None recommended by PT  Recommendations for Other Services       Functional Status Assessment Patient has had a recent decline in their functional status and demonstrates the ability to make significant improvements in function in a reasonable and predictable amount of time.     Precautions / Restrictions Precautions Precautions: Fall Restrictions Weight Bearing Restrictions: No      Mobility  Bed Mobility Overal bed mobility: Needs Assistance Bed Mobility: Supine to Sit     Supine to sit: Min guard     General bed mobility comments: Pt reaching out for therapist's hand for support. Pt did not pull from therapist's hand, however and was able to scoot herself to EOB with increased time.    Transfers Overall transfer level: Needs assistance Equipment used: Rolling walker (2 wheels), 2 person hand held assist Transfers: Sit to/from Stand, Bed to chair/wheelchair/BSC Sit to Stand: Min assist, +2 safety/equipment Stand pivot transfers: Min assist, +2 safety/equipment         General transfer comment: Increased time but pt was able to take pivotal steps around to the [facility]. With RW, pt appeared confused and pushed it to  the side to attempt to walk around it instead. We proceeded with HHA.    Ambulation/Gait Ambulation/Gait assistance: Min assist, +2 safety/equipment Gait Distance (Feet): 40  Feet Assistive device: 1 person hand held assist Gait Pattern/deviations: Step-through pattern, Shuffle, Trunk flexed, Narrow base of support Gait velocity: Decreased Gait velocity interpretation: <1.8 ft/sec, indicate of risk for recurrent falls   General Gait Details: Step-through approaching shuffling gait pattern. No overt LOB however pt appears unsteady at times. Chair follow utilized for seated rest after ~40'.  Stairs            Wheelchair Mobility    Modified Rankin (Stroke Patients Only)       Balance Overall balance assessment: Needs assistance Sitting-balance support: Feet supported, No upper extremity supported Sitting balance-Leahy Scale: Fair     Standing balance support: Single extremity supported, No upper extremity supported Standing balance-Leahy Scale: Poor Standing balance comment: At least 1 UE support required for dynamic activity.                             Pertinent Vitals/Pain Pain Assessment Pain Assessment: No/denies pain    Home Living Family/patient expects to be discharged to:: Private residence Living Arrangements: Children Available Help at Discharge: Family;Personal care attendant;Available 24 hours/day Type of Home: House Home Access: Stairs to enter Entrance Stairs-Rails: None Entrance Stairs-Number of Steps: 2 steps to enter through garage Alternate Level Stairs-Number of Steps: flght Home Layout: Two level;Able to live on main level with bedroom/bathroom Home Equipment: Shower seat;Rolling Walker (2 wheels);Shower seat - built in;Hand held shower head;BSC/3in1      Prior Function Prior Level of Function : Needs assist  Cognitive Assist : Mobility (cognitive);ADLs (cognitive)   ADLs (Cognitive): Intermittent cues Physical Assist : Mobility (physical);ADLs (physical)     Mobility Comments: Uses the walker most of the time. Uses son's arm for support on stairs.       Hand Dominance   Dominant Hand: Right     Extremity/Trunk Assessment   Upper Extremity Assessment Upper Extremity Assessment: Generalized weakness    Lower Extremity Assessment Lower Extremity Assessment: Generalized weakness    Cervical / Trunk Assessment Cervical / Trunk Assessment: Kyphotic  Communication   Communication: No difficulties  Cognition Arousal/Alertness: Awake/alert Behavior During Therapy: Impulsive Overall Cognitive Status: History of cognitive impairments - at baseline                                 General Comments: History of Alzheimer's dementia. Throughout session pt pleasant but repeatedly asking how long she would be here, where her dog is (dog deceased per son), and why she was here.        General Comments      Exercises     Assessment/Plan    PT Assessment Patient needs continued PT services  PT Problem List Decreased strength;Decreased activity tolerance;Decreased balance;Decreased mobility;Decreased knowledge of use of DME;Decreased safety awareness;Decreased knowledge of precautions;Pain       PT Treatment Interventions DME instruction;Gait training;Functional mobility training;Stair training;Therapeutic activities;Therapeutic exercise;Balance training;Patient/family education    PT Goals (Current goals can be found in the Care Plan section)  Acute Rehab PT Goals Patient Stated Goal: Pt states she wants to leave here PT Goal Formulation: With patient/family Time For Goal Achievement: 03/10/22 Potential to Achieve Goals: Good    Frequency Min 3X/week     Co-evaluation  AM-PAC PT "6 Clicks" Mobility  Outcome Measure Help needed turning from your back to your side while in a flat bed without using bedrails?: None Help needed moving from lying on your back to sitting on the side of a flat bed without using bedrails?: A Little Help needed moving to and from a bed to a chair (including a wheelchair)?: A Little Help needed standing up from a  chair using your arms (e.g., wheelchair or bedside chair)?: A Little Help needed to walk in hospital room?: A Little Help needed climbing 3-5 steps with a railing? : A Little 6 Click Score: 19    End of Session Equipment Utilized During Treatment: Gait belt Activity Tolerance: Patient tolerated treatment well Patient left: in chair;with call bell/phone within reach;with chair alarm set Nurse Communication: Mobility status PT Visit Diagnosis: Unsteadiness on feet (R26.81);Repeated falls (R29.6)    Time: 4540-9811 PT Time Calculation (min) (ACUTE ONLY): 31 min   Charges:   PT Evaluation $PT Eval Moderate Complexity: 1 Mod PT Treatments $Gait Training: 8-22 mins        [name], PT, DPT Acute Rehabilitation Services Secure Chat Preferred Office: [phone]   [name] 03/03/2022, 2:23 PM

## 2022-03-03 NOTE — Progress Notes (Addendum)
   Attending physician's note   I have taken a history, reviewed the chart, and examined the patient. I performed a substantive portion of this encounter, including complete performance of at least one of the key components, in conjunction with the APP. I agree with the APP's note, impression, and recommendations with my edits.    AST/ALT 191/297 (from 314/426) and T. bili 3.1 (from 4.1) ALP 193 (from 248) WBC 10.9 PCT 3.6 RUQ US: 1 cm hepatic hemangioma, CBD 7 mm and no e/o CDL. Cholelithiasis w/ GB wall thickening   - Management of GB per consulting surgical service. HIDA ordered and will decide on Abx vs perc chole pending results - Can repeat liver enzymes tomorrow to ensure continued downtrend - No role for ERCP or other endoscopic evaluation at this juncture - Viral hep panel negative/normal. Given downtrend of liver enzymes, no need for extended serologic w/u to r/o other concomitant liver disease - GI service will sign off at this time. Please do not hesitate to contact us with additional questions or concerns   , DO, FACG [phone] office          Progress Note   Subjective  Chief Complaint: Elevated LFTs  This morning, patient tells me that she is doing well.  She is asking when we will figure out what is actually going on with her.  Denies any abdominal pain.   Objective   Vital signs in last 24 hours: Temp:  [98.2 F (36.8 C)-98.7 F (37.1 C)] 98.3 F (36.8 C) (05/18 0758) Pulse Rate:  [57-87] 59 (05/18 0758) Resp:  [10-23] 17 (05/18 0758) BP: (108-160)/(52-107) 158/64 (05/18 0758) SpO2:  [89 %-99 %] 96 % (05/18 0758) Weight:  [58 kg-60 kg] 58 kg (05/18 0500)   General:    Elderly white female in NAD Heart:  Regular rate and rhythm; no murmurs Lungs: Respirations even and unlabored, lungs CTA bilaterally Abdomen:  Soft, nontender and nondistended. Normal bowel sounds. Psych:  Cooperative. Normal mood and affect.  Intake/Output from  previous day: 05/17 0701 - 05/18 0700 In: 1199.1 [IV Piggyback:1199.1] Out: -    Lab Results: Recent Labs    03/02/22 1022 03/03/22 0047  WBC 17.9* 10.5  HGB 11.8* 10.3*  HCT 35.0* 29.6*  PLT 144* 106*   BMET Recent Labs    03/02/22 1022 03/02/22 1642 03/03/22 0047  NA 134* 139 139  K 3.9 4.0 3.5  CL 100 105 107  CO2 25 25 27  GLUCOSE 165* 107* 92  BUN 22 20 22  CREATININE 1.21* 1.16* 1.10*  CALCIUM 8.8* 8.7* 8.4*      Latest Ref Rng & Units 03/03/2022   12:47 AM 03/02/2022    4:42 PM 03/02/2022   10:22 AM  Hepatic Function  Total Protein 6.5 - 8.1 g/dL 4.8   5.5   5.9    Albumin 3.5 - 5.0 g/dL 2.4   2.7   2.9    AST 15 - 41 U/L 191   314   488    ALT 0 - 44 U/L 297   426   526    Alk Phosphatase 38 - 126 U/L 193   248   262    Total Bilirubin 0.3 - 1.2 mg/dL 3.1   4.1   4.7      PT/INR Recent Labs    03/02/22 1642  LABPROT 20.4*  INR 1.8*    Studies/Results: CT HEAD WO CONTRAST  Result Date: 03/02/2022 CLINICAL DATA:    Neck trauma (Age >= 65y); Head trauma, minor (Age >= 65y). History of dens fracture. EXAM: CT HEAD WITHOUT CONTRAST CT CERVICAL SPINE WITHOUT CONTRAST TECHNIQUE: Multidetector CT imaging of the head and cervical spine was performed following the standard protocol without intravenous contrast. Multiplanar CT image reconstructions of the cervical spine were also generated. RADIATION DOSE REDUCTION: This exam was performed according to the departmental dose-optimization program which includes automated exposure control, adjustment of the mA and/or kV according to patient size and/or use of iterative reconstruction technique. COMPARISON:  05/21/2021 FINDINGS: CT HEAD FINDINGS Brain: No evidence of acute infarction, hemorrhage, hydrocephalus, extra-axial collection or mass lesion/mass effect. Scattered low-density changes within the periventricular and subcortical white matter compatible with chronic microvascular ischemic change. Mild diffuse cerebral  volume loss. Vascular: No hyperdense vessel or unexpected calcification. Skull: Normal. Negative for fracture or focal lesion. Sinuses/Orbits: No acute finding. Other: Negative for scalp hematoma. CT CERVICAL SPINE FINDINGS Alignment: Unchanged mild posterior subluxation of C1 relative to C2. Unchanged grade 1 anterolisthesis of C3 on C4. No facet dislocation. Skull base and vertebrae: Chronic ununited type 2 dens fracture. The dens remains mildly displaced posteriorly relative to the body of C2, unchanged. No new or acute fractures of the cervical spine. No focal lytic or sclerotic bone lesion. Soft tissues and spinal canal: No prevertebral fluid or swelling. No visible canal hematoma. Disc levels: Degenerative disc disease most pronounced at C5-6. Multilevel bilateral facet arthropathy, left worse than right. Progressive arthropathy at the C1-2 articulation. Upper chest: Negative. Other: None. IMPRESSION: 1. No acute intracranial abnormality. 2. No acute cervical spine fracture. 3. Chronic ununited type 2 dens fracture, unchanged from prior. 4. Chronic microvascular ischemic change and cerebral volume loss. Electronically Signed   By: Nicholas  Plundo D.O.   On: 03/02/2022 11:42   CT CERVICAL SPINE WO CONTRAST  Result Date: 03/02/2022 CLINICAL DATA:  Neck trauma (Age >= 65y); Head trauma, minor (Age >= 65y). History of dens fracture. EXAM: CT HEAD WITHOUT CONTRAST CT CERVICAL SPINE WITHOUT CONTRAST TECHNIQUE: Multidetector CT imaging of the head and cervical spine was performed following the standard protocol without intravenous contrast. Multiplanar CT image reconstructions of the cervical spine were also generated. RADIATION DOSE REDUCTION: This exam was performed according to the departmental dose-optimization program which includes automated exposure control, adjustment of the mA and/or kV according to patient size and/or use of iterative reconstruction technique. COMPARISON:  05/21/2021 FINDINGS: CT HEAD  FINDINGS Brain: No evidence of acute infarction, hemorrhage, hydrocephalus, extra-axial collection or mass lesion/mass effect. Scattered low-density changes within the periventricular and subcortical white matter compatible with chronic microvascular ischemic change. Mild diffuse cerebral volume loss. Vascular: No hyperdense vessel or unexpected calcification. Skull: Normal. Negative for fracture or focal lesion. Sinuses/Orbits: No acute finding. Other: Negative for scalp hematoma. CT CERVICAL SPINE FINDINGS Alignment: Unchanged mild posterior subluxation of C1 relative to C2. Unchanged grade 1 anterolisthesis of C3 on C4. No facet dislocation. Skull base and vertebrae: Chronic ununited type 2 dens fracture. The dens remains mildly displaced posteriorly relative to the body of C2, unchanged. No new or acute fractures of the cervical spine. No focal lytic or sclerotic bone lesion. Soft tissues and spinal canal: No prevertebral fluid or swelling. No visible canal hematoma. Disc levels: Degenerative disc disease most pronounced at C5-6. Multilevel bilateral facet arthropathy, left worse than right. Progressive arthropathy at the C1-2 articulation. Upper chest: Negative. Other: None. IMPRESSION: 1. No acute intracranial abnormality. 2. No acute cervical spine fracture. 3. Chronic ununited type 2   dens fracture, unchanged from prior. 4. Chronic microvascular ischemic change and cerebral volume loss. Electronically Signed   By: Nicholas  Plundo D.O.   On: 03/02/2022 11:42   CT ABDOMEN PELVIS W CONTRAST  Result Date: 03/02/2022 CLINICAL DATA:  acute hepatitis.  Fall. EXAM: CT ABDOMEN AND PELVIS WITH CONTRAST TECHNIQUE: Multidetector CT imaging of the abdomen and pelvis was performed using the standard protocol following bolus administration of intravenous contrast. RADIATION DOSE REDUCTION: This exam was performed according to the departmental dose-optimization program which includes automated exposure control,  adjustment of the mA and/or kV according to patient size and/or use of iterative reconstruction technique. CONTRAST:  65mL OMNIPAQUE IOHEXOL 350 MG/ML SOLN COMPARISON:  None Available. FINDINGS: Lower chest: Lingular scarring or atelectasis. Dependent left lower lobe airspace disease. Small left pleural effusion. Trace right pleural fluid. Mild cardiomegaly. Small hiatal hernia. Presumed pericardial cyst of 4.6 cm in the right cardiophrenic angle. Hepatobiliary: Nonspecific caudate lobe enlargement. Hepatic calcifications which are likely related to prior infection or inflammation. Probable hepatic steatosis. Subtle gallstones within the fundus. Gallbladder wall thickening and pericholecystic edema including on 31/3. No biliary duct dilatation. Pancreas: Normal pancreas for age. Spleen: Subcentimeter hypoattenuating splenic lesions are nonspecific but of doubtful clinical significance. No splenomegaly. Adrenals/Urinary Tract: Normal adrenal glands. Too small to characterize interpolar left renal lesion. Normal right kidney for age. No hydronephrosis. Normal urinary bladder. Stomach/Bowel: Gastric body underdistention. Rectal stool ball of 8.7 cm. Normal small bowel. Vascular/Lymphatic: Advanced aortic and branch vessel atherosclerosis. No abdominopelvic adenopathy. Reproductive: Hysterectomy.  No adnexal mass. Other: No significant free fluid.  No free intraperitoneal air. Musculoskeletal: Lumbar spondylosis with S-shaped thoracolumbar spine curvature. IMPRESSION: 1. No posttraumatic deformity identified. 2. Findings highly suspicious for acute cholecystitis. 3. Rectal stool ball suggests constipation or fecal impaction. 4. Small hiatal hernia. Gastric body apparent wall thickening is at least partially felt to be due to underdistention. Correlate with symptoms of gastritis. 5. Small left and trace right pleural effusions with dependent left base airspace disease. Suspicious for mild pneumonia or aspiration with  compressive atelectasis felt less likely. 6. Probable hepatic steatosis. Electronically Signed   By: Kyle  Talbot M.D.   On: 03/02/2022 13:36   DG Chest Port 1 View  Result Date: 03/02/2022 CLINICAL DATA:  89-year-old female with history of altered mental status. EXAM: PORTABLE CHEST 1 VIEW COMPARISON:  Chest x-ray 05/21/2021. FINDINGS: Bibasilar (left-greater-than-right) opacities indicative of atelectasis and/or consolidation. Small left pleural effusion. No definite right pleural effusion. No pneumothorax. No evidence of pulmonary edema. Heart size is mildly enlarged. Upper mediastinal contours are within normal limits. Atherosclerotic calcifications are noted in the thoracic aorta. IMPRESSION: 1. Bibasilar areas of atelectasis and/or consolidation. 2. Small left pleural effusion. 3. Mild cardiomegaly. 4. Aortic atherosclerosis. Electronically Signed   By: Daniel  Entrikin M.D.   On: 03/02/2022 11:12   US Abdomen Limited RUQ (LIVER/GB)  Result Date: 03/02/2022 CLINICAL DATA:  Right upper quadrant pain. EXAM: ULTRASOUND ABDOMEN LIMITED RIGHT UPPER QUADRANT COMPARISON:  CT abdomen and pelvis 03/02/2022 FINDINGS: Gallbladder: Gallstones are present. Multiple small layering gallstones are present measuring up to 3 mm. There is gallbladder wall thickening measuring 4 mm. Sonographic Murphy sign is negative. Common bile duct: Diameter: 7 mm.  (Within normal limits). Liver: There is a 10 x 8 x 8 mm hypoechoic mass in the right lobe of the liver without vascularity. Within normal limits in parenchymal echogenicity. Portal vein is patent on color Doppler imaging with normal direction of blood flow towards the liver.   Other: None. IMPRESSION: 1. Cholelithiasis with gallbladder wall thickening. Negative sonographic Murphy sign. No biliary ductal dilatation. Findings may related to acute or chronic cholecystitis. Correlate clinically. 2. 1 cm hypoechoic mass in the right lobe of the liver favored as hemangioma.  Electronically Signed   By: Amy  Guttmann M.D.   On: 03/02/2022 17:07     Assessment / Plan:    Assessment: 1.  Abdominal pain and vomiting: At time of presentation, no longer today, LFTs elevated, CT concerning for acute cholecystitis, ultrasound yesterday showing gallbladder wall thickening, no biliary ductal dilation or suggestion of choledocholithiasis, also probably fatty liver, LFTs trending down today 2.  Pleural effusions and dependent left base airspace disease suspicious for mild pneumonia 3.  Gastric wall thickening per CT: Family discussed history of GERD well controlled with daily Omeprazole 4.  Advanced Alzheimer's dementia  Plan: 1.  Continue to trend LFTs daily 2.  Trend CK 3.  Per surgical team they are checking a HIDA scan today given some abdominal tenderness to deep palpation-Per them patient wants to avoid surgery and they would recommend IV antibiotics +/- Perc chole 4.  Continue other supportive measures  Thank you for your kind consultation, we will continue to follow.   LOS: 1 day   Jennifer Lynne Lemmon  03/03/2022, 11:33 AM    

## 2022-03-03 NOTE — Plan of Care (Signed)
  Problem: Clinical Measurements: Goal: Diagnostic test results will improve Outcome: Progressing   Problem: Nutrition: Goal: Adequate nutrition will be maintained Outcome: Progressing   Problem: Pain Managment: Goal: General experience of comfort will improve Outcome: Progressing   

## 2022-03-03 NOTE — Progress Notes (Signed)
[facility]                                                                               [employer], is a 88 y.o. female, DOB - Apr 01, 1924, RCV:[id number] Admit date - 03/02/2022    Outpatient Primary MD for the patient is Ngetich, Dinah C, NP  LOS - 1  days    Brief summary    April Stevenson is a 88 y.o. female with medical history significant of hypertension, hyperlipidemia, hypothyroidism, Alzheimer's dementia, and hiatal hernia with GERD who presents after being found down this morning. She also reported nausea, vomiting and abdominal pain. CT scan of the head and cervical spine did not note any acute abnormalities, but did note a chronic ununited type II dens fracture unchanged from prior imaging from 05/2021.  Chest x-ray noted bibasilar atelectasis/consolidation with small left-sided pleural effusion and mild cardiomegaly.  CT scan of the abdomen pelvis noted findings highly suspicious for acute cholecystitis, rectal stool ball suggesting constipation or fecal impaction, small hiatal hernia . patient had been given 1 L normal saline IV fluids, Rocephin, and metronidazole.  Gastroenterology and general surgery consulted  Assessment & Plan    Assessment and Plan:  Abdominal pain with nausea and vomiting, CT of the abdomen and pelvis suspicious for acute cholecystitis and fecal impaction. General surgery consulted and plan for HIDA scan later today Meanwhile continue with empiric antibiotics with IV Rocephin and Flagyl. Gently hydrate, pain control, antiemetics.     Transaminitis and hyperbilirubinemia Continue to monitor   Fecal impaction/constipation Enema and bowel regimen on board.    Hypothyroidism Continue with Synthroid.  Suspected community-acquired pneumonia/left-sided pneumonia Aspiration precautions SLP evaluation recommending regular diet. Empiric IV antibiotics.    Unwitnessed fall at home Initial CT of the head  is negative for acute abnormality.  Will check MRI of the brain.  Patient has significant memory deficits. Therapy evaluations ordered.    Stage IIIb CKD -Creatinine appears to be at baseline at this time.  Monitor renal parameters while on gentle hydration.    Chronic diastolic heart failure Left ventricular ejection fraction on echocardiogram from 2020 showed 50 to 60%, with mild to moderate aortic valve regurgitation, mitral valve regurgitation. Continue with strict intake and output and daily weights.    Essential hypertension Blood pressure parameters appear to be optimal at this time Continue with hydralazine and olmesartan.    History of Alzheimer's dementia Delirium precautions.     Estimated body mass index is 20.64 kg/m as calculated from the following:   Height as of this encounter: '5\' 6"'$  (1.676 m).   Weight as of this encounter: 58 kg.  Code Status: DNR.  DVT Prophylaxis:  enoxaparin (LOVENOX) injection 30 mg Start: 03/02/22 1615   Level of Care: Level of care: Telemetry Medical Family Communication:  none at bedside.   Disposition Plan:     Remains inpatient appropriate:  further work up for cholecystitis.   Procedures:  Hida scan  Consultants:   GI SURGERY.  Antimicrobials:   Anti-infectives (From admission, onward)    Start     Dose/Rate Route Frequency Ordered Stop   03/03/22 1200  cefTRIAXone (ROCEPHIN) 2  g in sodium chloride 0.9 % 100 mL IVPB        2 g 200 mL/hr over 30 Minutes Intravenous Every 24 hours 03/02/22 1546     03/02/22 1245  cefTRIAXone (ROCEPHIN) 2 g in sodium chloride 0.9 % 100 mL IVPB        2 g 200 mL/hr over 30 Minutes Intravenous  Once 03/02/22 1238 03/02/22 1437   03/02/22 1245  metroNIDAZOLE (FLAGYL) IVPB 500 mg        500 mg 100 mL/hr over 60 Minutes Intravenous Every 12 hours 03/02/22 1238          Medications  Scheduled Meds:  enoxaparin (LOVENOX) injection  30 mg Subcutaneous Q24H   FLUoxetine  20 mg  Oral Daily   levothyroxine  25 mcg Oral QHS   melatonin  10 mg Oral QHS   pantoprazole  40 mg Oral Daily   senna-docusate  1 tablet Oral QHS   sodium chloride flush  3 mL Intravenous Q12H   traZODone  100 mg Oral QHS   Continuous Infusions:  cefTRIAXone (ROCEPHIN)  IV 2 g (03/03/22 1157)   metronidazole 500 mg (03/03/22 1248)   PRN Meds:.acetaminophen **OR** acetaminophen, albuterol, LORazepam, ondansetron **OR** ondansetron (ZOFRAN) IV, polyvinyl alcohol    Subjective:   April Stevenson was seen and examined today.  No nausea, vomiting. Abd pain has improved. Wants to know when she can go home.   Objective:   Vitals:   03/03/22 0500 03/03/22 0529 03/03/22 0758 03/03/22 1136  BP:  (!) 154/57 (!) 158/64 (!) 143/59  Pulse:  (!) 57 (!) 59 (!) 53  Resp:  '17 17 18  '$ Temp:  98.3 F (36.8 C) 98.3 F (36.8 C) 97.7 F (36.5 C)  TempSrc:  Oral Oral Oral  SpO2:  99% 96% 97%  Weight: 58 kg     Height:        Intake/Output Summary (Last 24 hours) at 03/03/2022 1308 Last data filed at 03/03/2022 0358 Gross per 24 hour  Intake 1199.08 ml  Output --  Net 1199.08 ml   Filed Weights   03/02/22 1012 03/02/22 1712 03/03/22 0500  Weight: 59 kg 60 kg 58 kg     Exam General exam: elderly woman, not in distress.  Respiratory system: Clear to auscultation. Respiratory effort normal. Cardiovascular system: S1 & S2 heard, RRR. No JVD, murmurs, rubs, gallops or clicks. No pedal edema. Gastrointestinal system: Abdomen is nondistended, soft and nontender. Normal bowel sounds heard. Central nervous system: Alert and orientedt o person only.  Extremities: Symmetric 5 x 5 power. Skin: No rashes, lesions or ulcers Psychiatry: calm and comfortable.    Data Reviewed:  I have personally reviewed following labs and imaging studies   CBC Lab Results  Component Value Date   WBC 10.5 03/03/2022   RBC 3.17 (L) 03/03/2022   HGB 10.3 (L) 03/03/2022   HCT 29.6 (L) 03/03/2022   MCV  93.4 03/03/2022   MCH 32.5 03/03/2022   PLT 106 (L) 03/03/2022   MCHC 34.8 03/03/2022   RDW 13.2 03/03/2022   LYMPHSABS 0.2 (L) 03/02/2022   MONOABS 0.9 03/02/2022   EOSABS 0.0 03/02/2022   BASOSABS 0.0 08/13/2535     Last metabolic panel Lab Results  Component Value Date   NA 139 03/03/2022   K 3.5 03/03/2022   CL 107 03/03/2022   CO2 27 03/03/2022   BUN 22 03/03/2022   CREATININE 1.10 (H) 03/03/2022   GLUCOSE 92 03/03/2022   GFRNONAA 46 (  L) 03/03/2022   GFRAA 41 (L) 01/14/2021   CALCIUM 8.4 (L) 03/03/2022   PHOS 3.7 05/23/2021   PROT 4.8 (L) 03/03/2022   ALBUMIN 2.4 (L) 03/03/2022   BILITOT 3.1 (H) 03/03/2022   ALKPHOS 193 (H) 03/03/2022   AST 191 (H) 03/03/2022   ALT 297 (H) 03/03/2022   ANIONGAP 5 03/03/2022    CBG (last 3)  Recent Labs    03/02/22 1052  GLUCAP 158*      Coagulation Profile: Recent Labs  Lab 03/02/22 1642  INR 1.8*     Radiology Studies: CT HEAD WO CONTRAST  Result Date: 03/02/2022 CLINICAL DATA:  Neck trauma (Age >= 65y); Head trauma, minor (Age >= 65y). History of dens fracture. EXAM: CT HEAD WITHOUT CONTRAST CT CERVICAL SPINE WITHOUT CONTRAST TECHNIQUE: Multidetector CT imaging of the head and cervical spine was performed following the standard protocol without intravenous contrast. Multiplanar CT image reconstructions of the cervical spine were also generated. RADIATION DOSE REDUCTION: This exam was performed according to the departmental dose-optimization program which includes automated exposure control, adjustment of the mA and/or kV according to patient size and/or use of iterative reconstruction technique. COMPARISON:  05/21/2021 FINDINGS: CT HEAD FINDINGS Brain: No evidence of acute infarction, hemorrhage, hydrocephalus, extra-axial collection or mass lesion/mass effect. Scattered low-density changes within the periventricular and subcortical white matter compatible with chronic microvascular ischemic change. Mild diffuse cerebral  volume loss. Vascular: No hyperdense vessel or unexpected calcification. Skull: Normal. Negative for fracture or focal lesion. Sinuses/Orbits: No acute finding. Other: Negative for scalp hematoma. CT CERVICAL SPINE FINDINGS Alignment: Unchanged mild posterior subluxation of C1 relative to C2. Unchanged grade 1 anterolisthesis of C3 on C4. No facet dislocation. Skull base and vertebrae: Chronic ununited type 2 dens fracture. The dens remains mildly displaced posteriorly relative to the body of C2, unchanged. No new or acute fractures of the cervical spine. No focal lytic or sclerotic bone lesion. Soft tissues and spinal canal: No prevertebral fluid or swelling. No visible canal hematoma. Disc levels: Degenerative disc disease most pronounced at C5-6. Multilevel bilateral facet arthropathy, left worse than right. Progressive arthropathy at the C1-2 articulation. Upper chest: Negative. Other: None. IMPRESSION: 1. No acute intracranial abnormality. 2. No acute cervical spine fracture. 3. Chronic ununited type 2 dens fracture, unchanged from prior. 4. Chronic microvascular ischemic change and cerebral volume loss. Electronically Signed   By: Davina Poke D.O.   On: 03/02/2022 11:42   CT CERVICAL SPINE WO CONTRAST  Result Date: 03/02/2022 CLINICAL DATA:  Neck trauma (Age >= 65y); Head trauma, minor (Age >= 65y). History of dens fracture. EXAM: CT HEAD WITHOUT CONTRAST CT CERVICAL SPINE WITHOUT CONTRAST TECHNIQUE: Multidetector CT imaging of the head and cervical spine was performed following the standard protocol without intravenous contrast. Multiplanar CT image reconstructions of the cervical spine were also generated. RADIATION DOSE REDUCTION: This exam was performed according to the departmental dose-optimization program which includes automated exposure control, adjustment of the mA and/or kV according to patient size and/or use of iterative reconstruction technique. COMPARISON:  05/21/2021 FINDINGS: CT HEAD  FINDINGS Brain: No evidence of acute infarction, hemorrhage, hydrocephalus, extra-axial collection or mass lesion/mass effect. Scattered low-density changes within the periventricular and subcortical white matter compatible with chronic microvascular ischemic change. Mild diffuse cerebral volume loss. Vascular: No hyperdense vessel or unexpected calcification. Skull: Normal. Negative for fracture or focal lesion. Sinuses/Orbits: No acute finding. Other: Negative for scalp hematoma. CT CERVICAL SPINE FINDINGS Alignment: Unchanged mild posterior subluxation of C1 relative to  C2. Unchanged grade 1 anterolisthesis of C3 on C4. No facet dislocation. Skull base and vertebrae: Chronic ununited type 2 dens fracture. The dens remains mildly displaced posteriorly relative to the body of C2, unchanged. No new or acute fractures of the cervical spine. No focal lytic or sclerotic bone lesion. Soft tissues and spinal canal: No prevertebral fluid or swelling. No visible canal hematoma. Disc levels: Degenerative disc disease most pronounced at C5-6. Multilevel bilateral facet arthropathy, left worse than right. Progressive arthropathy at the C1-2 articulation. Upper chest: Negative. Other: None. IMPRESSION: 1. No acute intracranial abnormality. 2. No acute cervical spine fracture. 3. Chronic ununited type 2 dens fracture, unchanged from prior. 4. Chronic microvascular ischemic change and cerebral volume loss. Electronically Signed   By: Davina Poke D.O.   On: 03/02/2022 11:42   CT ABDOMEN PELVIS W CONTRAST  Result Date: 03/02/2022 CLINICAL DATA:  acute hepatitis.  Fall. EXAM: CT ABDOMEN AND PELVIS WITH CONTRAST TECHNIQUE: Multidetector CT imaging of the abdomen and pelvis was performed using the standard protocol following bolus administration of intravenous contrast. RADIATION DOSE REDUCTION: This exam was performed according to the departmental dose-optimization program which includes automated exposure control,  adjustment of the mA and/or kV according to patient size and/or use of iterative reconstruction technique. CONTRAST:  87m OMNIPAQUE IOHEXOL 350 MG/ML SOLN COMPARISON:  None Available. FINDINGS: Lower chest: Lingular scarring or atelectasis. Dependent left lower lobe airspace disease. Small left pleural effusion. Trace right pleural fluid. Mild cardiomegaly. Small hiatal hernia. Presumed pericardial cyst of 4.6 cm in the right cardiophrenic angle. Hepatobiliary: Nonspecific caudate lobe enlargement. Hepatic calcifications which are likely related to prior infection or inflammation. Probable hepatic steatosis. Subtle gallstones within the fundus. Gallbladder wall thickening and pericholecystic edema including on 31/3. No biliary duct dilatation. Pancreas: Normal pancreas for age. Spleen: Subcentimeter hypoattenuating splenic lesions are nonspecific but of doubtful clinical significance. No splenomegaly. Adrenals/Urinary Tract: Normal adrenal glands. Too small to characterize interpolar left renal lesion. Normal right kidney for age. No hydronephrosis. Normal urinary bladder. Stomach/Bowel: Gastric body underdistention. Rectal stool ball of 8.7 cm. Normal small bowel. Vascular/Lymphatic: Advanced aortic and branch vessel atherosclerosis. No abdominopelvic adenopathy. Reproductive: Hysterectomy.  No adnexal mass. Other: No significant free fluid.  No free intraperitoneal air. Musculoskeletal: Lumbar spondylosis with S-shaped thoracolumbar spine curvature. IMPRESSION: 1. No posttraumatic deformity identified. 2. Findings highly suspicious for acute cholecystitis. 3. Rectal stool ball suggests constipation or fecal impaction. 4. Small hiatal hernia. Gastric body apparent wall thickening is at least partially felt to be due to underdistention. Correlate with symptoms of gastritis. 5. Small left and trace right pleural effusions with dependent left base airspace disease. Suspicious for mild pneumonia or aspiration with  compressive atelectasis felt less likely. 6. Probable hepatic steatosis. Electronically Signed   By: KAbigail MiyamotoM.D.   On: 03/02/2022 13:36   DG Chest Port 1 View  Result Date: 03/02/2022 CLINICAL DATA:  88year old female with history of altered mental status. EXAM: PORTABLE CHEST 1 VIEW COMPARISON:  Chest x-ray 05/21/2021. FINDINGS: Bibasilar (left-greater-than-right) opacities indicative of atelectasis and/or consolidation. Small left pleural effusion. No definite right pleural effusion. No pneumothorax. No evidence of pulmonary edema. Heart size is mildly enlarged. Upper mediastinal contours are within normal limits. Atherosclerotic calcifications are noted in the thoracic aorta. IMPRESSION: 1. Bibasilar areas of atelectasis and/or consolidation. 2. Small left pleural effusion. 3. Mild cardiomegaly. 4. Aortic atherosclerosis. Electronically Signed   By: DVinnie LangtonM.D.   On: 03/02/2022 11:12   UKoreaAbdomen Limited  RUQ (LIVER/GB)  Result Date: 03/02/2022 CLINICAL DATA:  Right upper quadrant pain. EXAM: ULTRASOUND ABDOMEN LIMITED RIGHT UPPER QUADRANT COMPARISON:  CT abdomen and pelvis 03/02/2022 FINDINGS: Gallbladder: Gallstones are present. Multiple small layering gallstones are present measuring up to 3 mm. There is gallbladder wall thickening measuring 4 mm. Sonographic Percell Miller sign is negative. Common bile duct: Diameter: 7 mm.  (Within normal limits). Liver: There is a 10 x 8 x 8 mm hypoechoic mass in the right lobe of the liver without vascularity. Within normal limits in parenchymal echogenicity. Portal vein is patent on color Doppler imaging with normal direction of blood flow towards the liver. Other: None. IMPRESSION: 1. Cholelithiasis with gallbladder wall thickening. Negative sonographic Murphy sign. No biliary ductal dilatation. Findings may related to acute or chronic cholecystitis. Correlate clinically. 2. 1 cm hypoechoic mass in the right lobe of the liver favored as hemangioma.  Electronically Signed   By: Ronney Asters M.D.   On: 03/02/2022 17:07       Hosie Poisson M.D. [facility] 03/03/2022, 1:08 PM  Available via Epic secure chat 7am-7pm After 7 pm, please refer to night coverage provider listed on amion.

## 2022-03-03 NOTE — Progress Notes (Signed)
Initial Nutrition Assessment  DOCUMENTATION CODES:  Not applicable  INTERVENTION:  Advance diet to regular after procedure Magic cup TID with meals, each supplement provides 290 kcal and 9 grams of protein Multivitamins with minerals daily  NUTRITION DIAGNOSIS:  Inadequate oral intake related to inability to eat as evidenced by NPO status.  GOAL:  Patient will meet greater than or equal to 90% of their needs  MONITOR:  PO intake, Diet advancement, Labs  REASON FOR ASSESSMENT:  Malnutrition Screening Tool    ASSESSMENT:  Pt with hx of HTN, HLD, hypothyroidism GERD, dementia, and CHF presented to ED after being found down by her son. Reports that pt recently complained of abdominal pain, LFTs elevated on admission.  Pt currently NPO, awaiting HIDA scan to determine if cholecystitis is present.   Pt resting in bedside chair at the time of visit, son at bedside assists with nutrition hx. Reports that she has a good appetite. Always eats 3 meals a day and snacks between. States that poor appetite was acute and only related to abdominal pain.   Reviewed weight hx, slow loss noted over the last year. Concerning due to advanced age but not severe (10% in ~ 9 months). Pt does not drink nutrition supplements at baseline but does like ice cream. Will add magic cup when diet is advanced and recommend regular diet with ordering assistance after procedure.  Nutritionally Relevant Medications: Scheduled Meds:  pantoprazole  40 mg Oral Daily   senna-docusate  1 tablet Oral QHS   Continuous Infusions:  cefTRIAXone (ROCEPHIN)  IV 2 g (03/03/22 1157)   lactated ringers 75 mL/hr at 03/03/22 1730   metronidazole 500 mg (03/03/22 1248)   PRN Meds:ondansetron  Labs Reviewed: Creatinine 1.1 Lab Results  Component Value Date   ALT 297 (H) 03/03/2022   AST 191 (H) 03/03/2022   ALKPHOS 193 (H) 03/03/2022   BILITOT 3.1 (H) 03/03/2022    NUTRITION - FOCUSED PHYSICAL EXAM: Some mild muscle  and fat deficits present but pt and son report ongoing good appetite. Suspect deficits are more consistent with normal aging. Flowsheet Row Most Recent Value  Orbital Region No depletion  Upper Arm Region Mild depletion  Thoracic and Lumbar Region Mild depletion  Buccal Region No depletion  Temple Region Mild depletion  Clavicle Bone Region No depletion  Clavicle and Acromion Bone Region Mild depletion  Scapular Bone Region Mild depletion  Dorsal Hand No depletion  Patellar Region Mild depletion  Anterior Thigh Region Mild depletion  Posterior Calf Region Mild depletion  Edema (RD Assessment) None  Hair Reviewed  Eyes Reviewed  Mouth Reviewed  Skin Reviewed  Nails Reviewed   Diet Order:   Diet Order             Diet NPO time specified Except for: Sips with Meds  Diet effective now                   EDUCATION NEEDS:  No education needs have been identified at this time  Skin:  Skin Assessment: Reviewed RN Assessment  Last BM:  5/18 type 7  Height:  Ht Readings from Last 1 Encounters:  03/02/22 '5\' 6"'$  (1.676 m)    Weight:  Wt Readings from Last 1 Encounters:  03/03/22 58 kg    Ideal Body Weight:  59.1 kg  BMI:  Body mass index is 20.64 kg/m.  Estimated Nutritional Needs:  Kcal:  1400-1600 kcal/d Protein:  70-80g/d Fluid:  >/=1.5L/d    Ranell Patrick,  RD, LDN Clinical Dietitian RD pager # available in [place]  After hours/weekend pager # available in [facility]

## 2022-03-04 ENCOUNTER — Inpatient Hospital Stay (HOSPITAL_COMMUNITY): Payer: Medicare Other

## 2022-03-04 ENCOUNTER — Telehealth: Payer: Self-pay

## 2022-03-04 DIAGNOSIS — R7989 Other specified abnormal findings of blood chemistry: Secondary | ICD-10-CM | POA: Diagnosis not present

## 2022-03-04 DIAGNOSIS — Z515 Encounter for palliative care: Secondary | ICD-10-CM

## 2022-03-04 DIAGNOSIS — K8020 Calculus of gallbladder without cholecystitis without obstruction: Secondary | ICD-10-CM | POA: Diagnosis not present

## 2022-03-04 DIAGNOSIS — K810 Acute cholecystitis: Secondary | ICD-10-CM | POA: Diagnosis not present

## 2022-03-04 LAB — COMPREHENSIVE METABOLIC PANEL
ALT: 173 U/L — ABNORMAL HIGH (ref 0–44)
AST: 56 U/L — ABNORMAL HIGH (ref 15–41)
Albumin: 2.3 g/dL — ABNORMAL LOW (ref 3.5–5.0)
Alkaline Phosphatase: 195 U/L — ABNORMAL HIGH (ref 38–126)
Anion gap: 8 (ref 5–15)
BUN: 25 mg/dL — ABNORMAL HIGH (ref 8–23)
CO2: 23 mmol/L (ref 22–32)
Calcium: 8.3 mg/dL — ABNORMAL LOW (ref 8.9–10.3)
Chloride: 109 mmol/L (ref 98–111)
Creatinine, Ser: 0.99 mg/dL (ref 0.44–1.00)
GFR, Estimated: 53 mL/min — ABNORMAL LOW (ref >60)
Glucose, Bld: 85 mg/dL (ref 70–99)
Potassium: 3.6 mmol/L (ref 3.5–5.1)
Sodium: 140 mmol/L (ref 135–145)
Total Bilirubin: 1.3 mg/dL — ABNORMAL HIGH (ref 0.3–1.2)
Total Protein: 5 g/dL — ABNORMAL LOW (ref 6.5–8.1)

## 2022-03-04 LAB — CBC
HCT: 31.5 % — ABNORMAL LOW (ref 36.0–46.0)
Hemoglobin: 10.6 g/dL — ABNORMAL LOW (ref 12.0–15.0)
MCH: 31.8 pg (ref 26.0–34.0)
MCHC: 33.7 g/dL (ref 30.0–36.0)
MCV: 94.6 fL (ref 80.0–100.0)
Platelets: 120 10*3/uL — ABNORMAL LOW (ref 150–400)
RBC: 3.33 MIL/uL — ABNORMAL LOW (ref 3.87–5.11)
RDW: 13.3 % (ref 11.5–15.5)
WBC: 6.3 10*3/uL (ref 4.0–10.5)
nRBC: 0 % (ref 0.0–0.2)

## 2022-03-04 IMAGING — MR MR HEAD W/O CM
6 of 10 series · 27 of 48 positions shown · non-contrast
Comparison: None Available.

CLINICAL DATA: Altered mental status

EXAM:
MRI HEAD WITHOUT CONTRAST
TECHNIQUE: Multiplanar, multiecho pulse sequences of the brain and surrounding
structures were obtained without intravenous contrast.

[Series 2: DWI · axial · 3.0mm · 0.94mm/px · z∈[-19,+122]mm · 8 of 96 slices shown (1 of 2)]
[im 1/96]
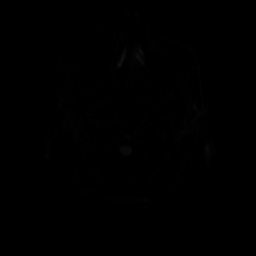
[im 11/96]
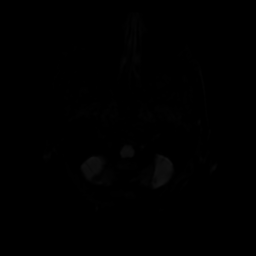
[im 32/96]
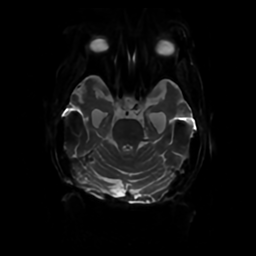
[im 43/96]
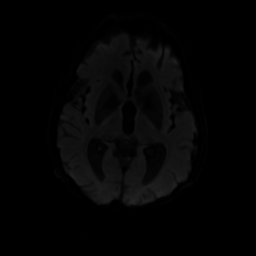
[im 53/96]
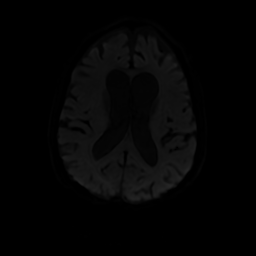
[im 64/96]
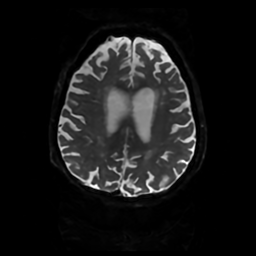
[im 85/96]
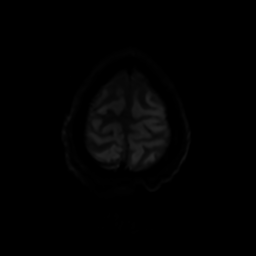
[im 96/96]
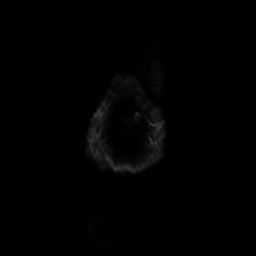

[Series 3: DWI · coronal · 4.0mm · 0.94mm/px · 7 of 74 slices shown (2 of 2)]
[im 1/74]
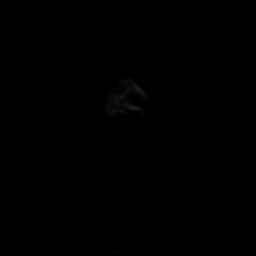
[im 13/74]
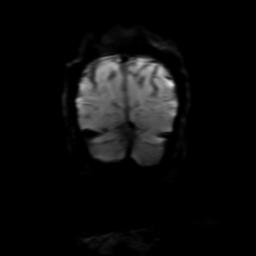
[im 25/74]
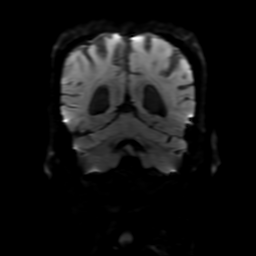
[im 37/74]
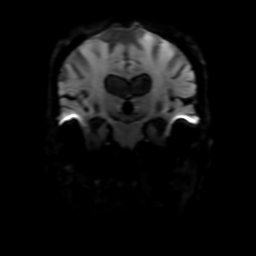
[im 49/74]
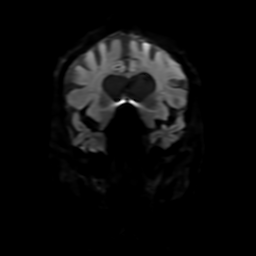
[im 61/74]
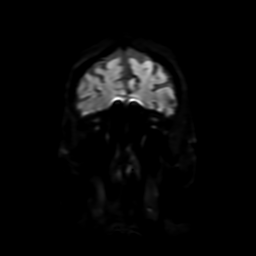
[im 74/74]
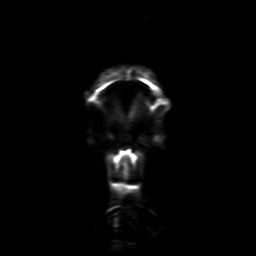

[Series 4: FLAIR · sagittal · 5.0mm · 0.23mm/px · 2 of 25 slices shown (1 of 2)]
[im 1/25]
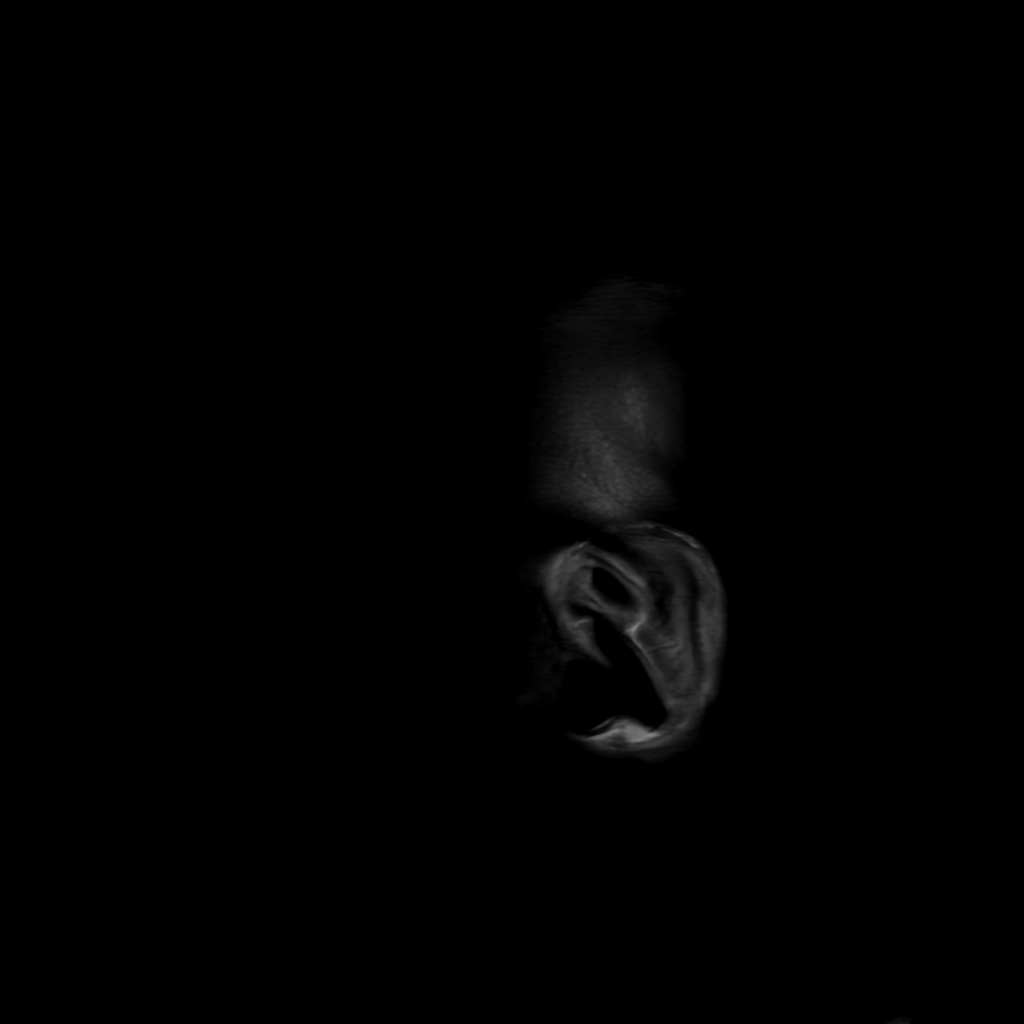
[im 25/25]
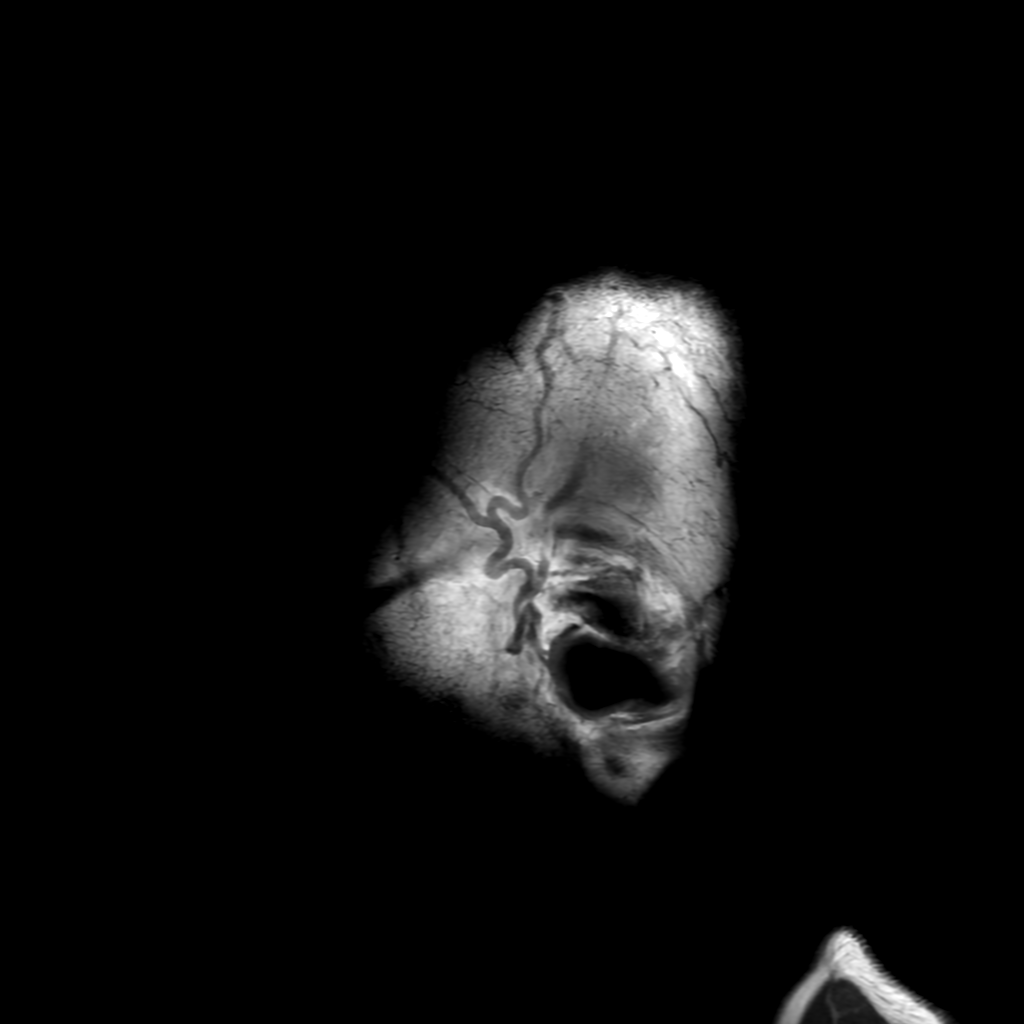

[Series 6: FLAIR · axial · 4.0mm · 0.45mm/px · z∈[-26,+123]mm · 3 of 35 slices shown (2 of 2)]
[im 1/35]
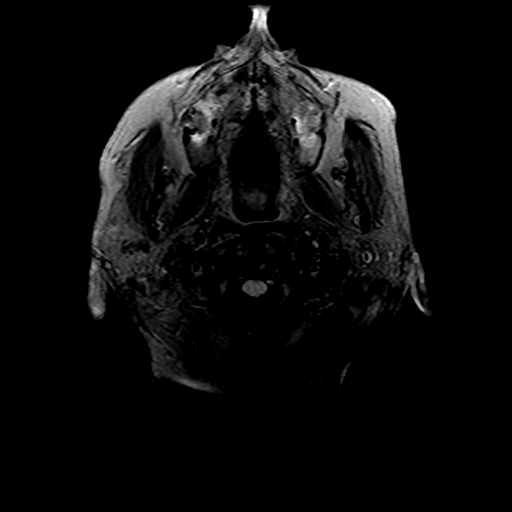
[im 18/35]
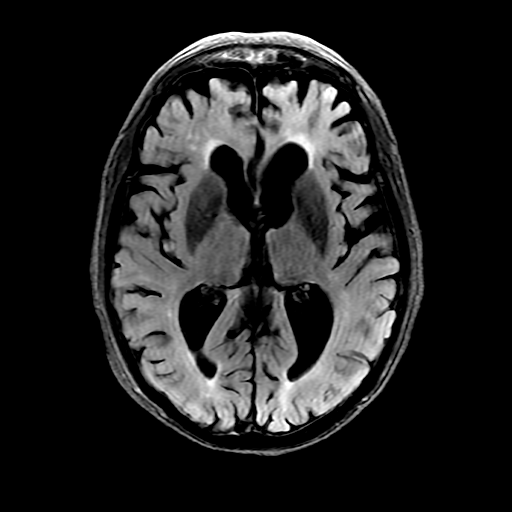
[im 35/35]
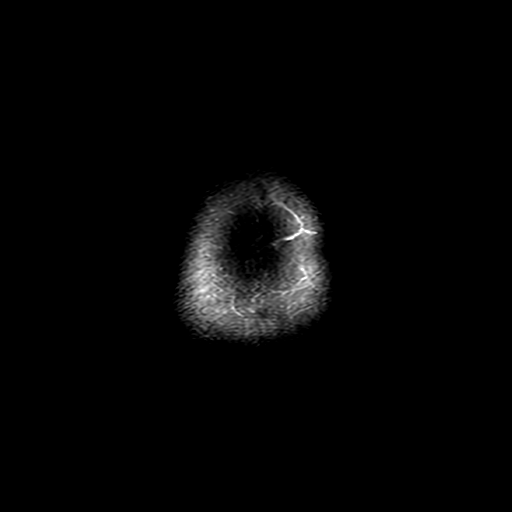

[Series 250: ADC · axial · 3.0mm · 0.94mm/px · z∈[-19,+122]mm · 4 of 48 slices shown (1 of 2)]
[im 1/48]
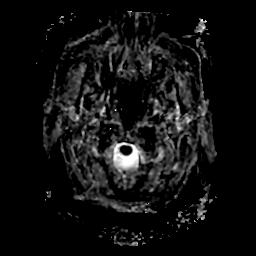
[im 16/48]
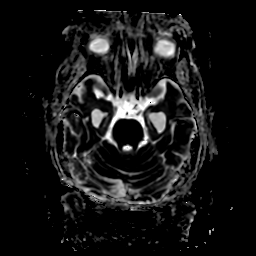
[im 32/48]
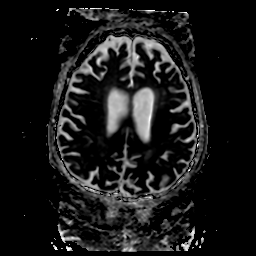
[im 48/48]
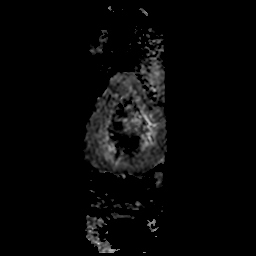

[Series 350: ADC · coronal · 4.0mm · 0.94mm/px · 3 of 37 slices shown (2 of 2)]
[im 1/37]
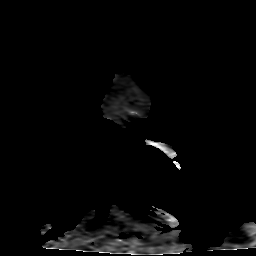
[im 19/37]
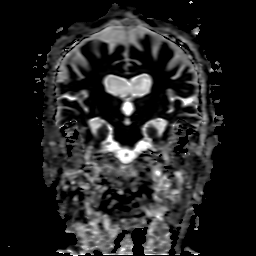
[im 37/37]
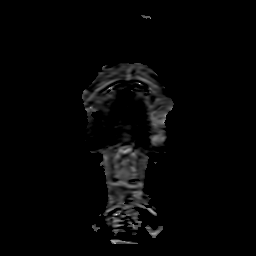

[27 of 48 positions shown; findings below may reference images not displayed]

FINDINGS: Brain: No acute infarct, mass effect or extra-axial collection. No
acute or chronic hemorrhage. There is multifocal hyperintense
T2-weighted signal within the white matter. Generalized cerebral
volume loss. The midline structures are normal.

Vascular: Major flow voids are preserved.

Skull and upper cervical spine: Normal calvarium and skull base.
Visualized upper cervical spine and soft tissues are normal.

Sinuses/Orbits:No paranasal sinus fluid levels or advanced mucosal
thickening. No mastoid or middle ear effusion. Normal orbits.
IMPRESSION: 1. No acute intracranial abnormality.
2. Chronic small vessel ischemia and generalized volume loss.

## 2022-03-04 MED ORDER — SENNOSIDES-DOCUSATE SODIUM 8.6-50 MG PO TABS
2.0000 | ORAL_TABLET | Freq: Two times a day (BID) | ORAL | Status: DC
  Administered 2022-03-04 – 2022-03-05 (×3): 2 via ORAL
  Filled 2022-03-04 (×3): qty 2

## 2022-03-04 MED ORDER — POLYETHYLENE GLYCOL 3350 17 G PO PACK
17.0000 g | PACK | Freq: Two times a day (BID) | ORAL | Status: DC
  Administered 2022-03-04 – 2022-03-05 (×2): 17 g via ORAL
  Filled 2022-03-04 (×3): qty 1

## 2022-03-04 MED ORDER — BISACODYL 10 MG RE SUPP
10.0000 mg | Freq: Once | RECTAL | Status: AC
  Administered 2022-03-04: 10 mg via RECTAL
  Filled 2022-03-04: qty 1

## 2022-03-04 MED ORDER — HYDROXYZINE HCL 10 MG PO TABS: 10.0000 mg | ORAL_TABLET | Freq: Three times a day (TID) | ORAL | Status: DC | PRN

## 2022-03-04 NOTE — Progress Notes (Signed)
Subjective: CC: Still mild RUQ pain. No n/v.   Objective: Vital signs in last 24 hours: Temp:  [97.5 F (36.4 C)-98.3 F (36.8 C)] 97.5 F (36.4 C) (05/19 0417) Pulse Rate:  [53-77] 64 (05/19 0417) Resp:  [14-18] 18 (05/19 0417) BP: (143-158)/(59-95) 158/59 (05/19 0417) SpO2:  [96 %-97 %] 96 % (05/19 0417) Weight:  [58.2 kg] 58.2 kg (05/19 0529)    Intake/Output from previous day: 05/18 0701 - 05/19 0700 In: 240 [P.O.:240] Out: -  Intake/Output this shift: No intake/output data recorded.  PE: Gen:  Alert, NAD, pleasant Abd: Soft, ND, very mild ruq tenderness, +BS  Lab Results:  Recent Labs    03/02/22 1022 03/03/22 0047  WBC 17.9* 10.5  HGB 11.8* 10.3*  HCT 35.0* 29.6*  PLT 144* 106*   BMET Recent Labs    03/02/22 1642 03/03/22 0047  NA 139 139  K 4.0 3.5  CL 105 107  CO2 25 27  GLUCOSE 107* 92  BUN 20 22  CREATININE 1.16* 1.10*  CALCIUM 8.7* 8.4*   PT/INR Recent Labs    03/02/22 1642  LABPROT 20.4*  INR 1.8*   CMP     Component Value Date/Time   NA 139 03/03/2022 0047   NA 139 03/05/2019 0600   K 3.5 03/03/2022 0047   CL 107 03/03/2022 0047   CO2 27 03/03/2022 0047   GLUCOSE 92 03/03/2022 0047   BUN 22 03/03/2022 0047   BUN 26 (A) 03/05/2019 0600   CREATININE 1.10 (H) 03/03/2022 0047   CREATININE 1.33 (H) 11/04/2021 1009   CALCIUM 8.4 (L) 03/03/2022 0047   PROT 4.8 (L) 03/03/2022 0047   ALBUMIN 2.4 (L) 03/03/2022 0047   AST 191 (H) 03/03/2022 0047   ALT 297 (H) 03/03/2022 0047   ALKPHOS 193 (H) 03/03/2022 0047   BILITOT 3.1 (H) 03/03/2022 0047   GFRNONAA 46 (L) 03/03/2022 0047   GFRNONAA 35 (L) 01/14/2021 1228   GFRAA 41 (L) 01/14/2021 1228   Lipase  No results found for: LIPASE  Studies/Results: CT HEAD WO CONTRAST  Result Date: 03/02/2022 CLINICAL DATA:  Neck trauma (Age >= 65y); Head trauma, minor (Age >= 65y). History of dens fracture. EXAM: CT HEAD WITHOUT CONTRAST CT CERVICAL SPINE WITHOUT CONTRAST TECHNIQUE:  Multidetector CT imaging of the head and cervical spine was performed following the standard protocol without intravenous contrast. Multiplanar CT image reconstructions of the cervical spine were also generated. RADIATION DOSE REDUCTION: This exam was performed according to the departmental dose-optimization program which includes automated exposure control, adjustment of the mA and/or kV according to patient size and/or use of iterative reconstruction technique. COMPARISON:  05/21/2021 FINDINGS: CT HEAD FINDINGS Brain: No evidence of acute infarction, hemorrhage, hydrocephalus, extra-axial collection or mass lesion/mass effect. Scattered low-density changes within the periventricular and subcortical white matter compatible with chronic microvascular ischemic change. Mild diffuse cerebral volume loss. Vascular: No hyperdense vessel or unexpected calcification. Skull: Normal. Negative for fracture or focal lesion. Sinuses/Orbits: No acute finding. Other: Negative for scalp hematoma. CT CERVICAL SPINE FINDINGS Alignment: Unchanged mild posterior subluxation of C1 relative to C2. Unchanged grade 1 anterolisthesis of C3 on C4. No facet dislocation. Skull base and vertebrae: Chronic ununited type 2 dens fracture. The dens remains mildly displaced posteriorly relative to the body of C2, unchanged. No new or acute fractures of the cervical spine. No focal lytic or sclerotic bone lesion. Soft tissues and spinal canal: No prevertebral fluid or swelling. No visible canal hematoma.  Disc levels: Degenerative disc disease most pronounced at C5-6. Multilevel bilateral facet arthropathy, left worse than right. Progressive arthropathy at the C1-2 articulation. Upper chest: Negative. Other: None. IMPRESSION: 1. No acute intracranial abnormality. 2. No acute cervical spine fracture. 3. Chronic ununited type 2 dens fracture, unchanged from prior. 4. Chronic microvascular ischemic change and cerebral volume loss. Electronically Signed    By: Davina Poke D.O.   On: 03/02/2022 11:42   CT CERVICAL SPINE WO CONTRAST  Result Date: 03/02/2022 CLINICAL DATA:  Neck trauma (Age >= 65y); Head trauma, minor (Age >= 65y). History of dens fracture. EXAM: CT HEAD WITHOUT CONTRAST CT CERVICAL SPINE WITHOUT CONTRAST TECHNIQUE: Multidetector CT imaging of the head and cervical spine was performed following the standard protocol without intravenous contrast. Multiplanar CT image reconstructions of the cervical spine were also generated. RADIATION DOSE REDUCTION: This exam was performed according to the departmental dose-optimization program which includes automated exposure control, adjustment of the mA and/or kV according to patient size and/or use of iterative reconstruction technique. COMPARISON:  05/21/2021 FINDINGS: CT HEAD FINDINGS Brain: No evidence of acute infarction, hemorrhage, hydrocephalus, extra-axial collection or mass lesion/mass effect. Scattered low-density changes within the periventricular and subcortical white matter compatible with chronic microvascular ischemic change. Mild diffuse cerebral volume loss. Vascular: No hyperdense vessel or unexpected calcification. Skull: Normal. Negative for fracture or focal lesion. Sinuses/Orbits: No acute finding. Other: Negative for scalp hematoma. CT CERVICAL SPINE FINDINGS Alignment: Unchanged mild posterior subluxation of C1 relative to C2. Unchanged grade 1 anterolisthesis of C3 on C4. No facet dislocation. Skull base and vertebrae: Chronic ununited type 2 dens fracture. The dens remains mildly displaced posteriorly relative to the body of C2, unchanged. No new or acute fractures of the cervical spine. No focal lytic or sclerotic bone lesion. Soft tissues and spinal canal: No prevertebral fluid or swelling. No visible canal hematoma. Disc levels: Degenerative disc disease most pronounced at C5-6. Multilevel bilateral facet arthropathy, left worse than right. Progressive arthropathy at the C1-2  articulation. Upper chest: Negative. Other: None. IMPRESSION: 1. No acute intracranial abnormality. 2. No acute cervical spine fracture. 3. Chronic ununited type 2 dens fracture, unchanged from prior. 4. Chronic microvascular ischemic change and cerebral volume loss. Electronically Signed   By: Davina Poke D.O.   On: 03/02/2022 11:42   NM Hepatobiliary Liver Func  Result Date: 03/03/2022 CLINICAL DATA:  Dilated gallbladder with wall thickening and cholelithiasis on recent ultrasound, evaluate for cystic duct patency. EXAM: NUCLEAR MEDICINE HEPATOBILIARY IMAGING TECHNIQUE: Sequential images of the abdomen were obtained out to 60 minutes following intravenous administration of radiopharmaceutical. RADIOPHARMACEUTICALS:  7.1 mCi Tc-46m Choletec IV COMPARISON:  Ultrasound from the previous day. FINDINGS: Prompt uptake and biliary excretion of activity by the liver is seen. Biliary activity passes into small bowel, consistent with patent common bile duct. Gallbladder is not visualized. 2.3 mg of morphine was then administered in an effort to shunt biliary tracer into the gallbladder. Delayed images demonstrate visualization of the gallbladder consistent with cystic duct patency. IMPRESSION: Normal uptake and excretion of biliary tracer. Initial nonvisualization of the gallbladder with subsequent visualization following morphine administration indicating cystic duct patency. Electronically Signed   By: MInez CatalinaM.D.   On: 03/03/2022 19:30   CT ABDOMEN PELVIS W CONTRAST  Result Date: 03/02/2022 CLINICAL DATA:  acute hepatitis.  Fall. EXAM: CT ABDOMEN AND PELVIS WITH CONTRAST TECHNIQUE: Multidetector CT imaging of the abdomen and pelvis was performed using the standard protocol following bolus administration of intravenous  contrast. RADIATION DOSE REDUCTION: This exam was performed according to the departmental dose-optimization program which includes automated exposure control, adjustment of the mA and/or  kV according to patient size and/or use of iterative reconstruction technique. CONTRAST:  71m OMNIPAQUE IOHEXOL 350 MG/ML SOLN COMPARISON:  None Available. FINDINGS: Lower chest: Lingular scarring or atelectasis. Dependent left lower lobe airspace disease. Small left pleural effusion. Trace right pleural fluid. Mild cardiomegaly. Small hiatal hernia. Presumed pericardial cyst of 4.6 cm in the right cardiophrenic angle. Hepatobiliary: Nonspecific caudate lobe enlargement. Hepatic calcifications which are likely related to prior infection or inflammation. Probable hepatic steatosis. Subtle gallstones within the fundus. Gallbladder wall thickening and pericholecystic edema including on 31/3. No biliary duct dilatation. Pancreas: Normal pancreas for age. Spleen: Subcentimeter hypoattenuating splenic lesions are nonspecific but of doubtful clinical significance. No splenomegaly. Adrenals/Urinary Tract: Normal adrenal glands. Too small to characterize interpolar left renal lesion. Normal right kidney for age. No hydronephrosis. Normal urinary bladder. Stomach/Bowel: Gastric body underdistention. Rectal stool ball of 8.7 cm. Normal small bowel. Vascular/Lymphatic: Advanced aortic and branch vessel atherosclerosis. No abdominopelvic adenopathy. Reproductive: Hysterectomy.  No adnexal mass. Other: No significant free fluid.  No free intraperitoneal air. Musculoskeletal: Lumbar spondylosis with S-shaped thoracolumbar spine curvature. IMPRESSION: 1. No posttraumatic deformity identified. 2. Findings highly suspicious for acute cholecystitis. 3. Rectal stool ball suggests constipation or fecal impaction. 4. Small hiatal hernia. Gastric body apparent wall thickening is at least partially felt to be due to underdistention. Correlate with symptoms of gastritis. 5. Small left and trace right pleural effusions with dependent left base airspace disease. Suspicious for mild pneumonia or aspiration with compressive atelectasis felt  less likely. 6. Probable hepatic steatosis. Electronically Signed   By: KAbigail MiyamotoM.D.   On: 03/02/2022 13:36   DG Chest Port 1 View  Result Date: 03/02/2022 CLINICAL DATA:  88year old female with history of altered mental status. EXAM: PORTABLE CHEST 1 VIEW COMPARISON:  Chest x-ray 05/21/2021. FINDINGS: Bibasilar (left-greater-than-right) opacities indicative of atelectasis and/or consolidation. Small left pleural effusion. No definite right pleural effusion. No pneumothorax. No evidence of pulmonary edema. Heart size is mildly enlarged. Upper mediastinal contours are within normal limits. Atherosclerotic calcifications are noted in the thoracic aorta. IMPRESSION: 1. Bibasilar areas of atelectasis and/or consolidation. 2. Small left pleural effusion. 3. Mild cardiomegaly. 4. Aortic atherosclerosis. Electronically Signed   By: DVinnie LangtonM.D.   On: 03/02/2022 11:12   [place]Abdomen Limited RUQ (LIVER/GB)  Result Date: 03/02/2022 CLINICAL DATA:  Right upper quadrant pain. EXAM: ULTRASOUND ABDOMEN LIMITED RIGHT UPPER QUADRANT COMPARISON:  CT abdomen and pelvis 03/02/2022 FINDINGS: Gallbladder: Gallstones are present. Multiple small layering gallstones are present measuring up to 3 mm. There is gallbladder wall thickening measuring 4 mm. Sonographic MPercell Millersign is negative. Common bile duct: Diameter: 7 mm.  (Within normal limits). Liver: There is a 10 x 8 x 8 mm hypoechoic mass in the right lobe of the liver without vascularity. Within normal limits in parenchymal echogenicity. Portal vein is patent on color Doppler imaging with normal direction of blood flow towards the liver. Other: None. IMPRESSION: 1. Cholelithiasis with gallbladder wall thickening. Negative sonographic Murphy sign. No biliary ductal dilatation. Findings may related to acute or chronic cholecystitis. Correlate clinically. 2. 1 cm hypoechoic mass in the right lobe of the liver favored as hemangioma. Electronically Signed   By: ARonney AstersM.D.   On: 03/02/2022 17:07    Anti-infectives: Anti-infectives (From admission, onward)    Start  Dose/Rate Route Frequency Ordered Stop   03/03/22 1200  cefTRIAXone (ROCEPHIN) 2 g in sodium chloride 0.9 % 100 mL IVPB        2 g 200 mL/hr over 30 Minutes Intravenous Every 24 hours 03/02/22 1546     03/02/22 1245  cefTRIAXone (ROCEPHIN) 2 g in sodium chloride 0.9 % 100 mL IVPB        2 g 200 mL/hr over 30 Minutes Intravenous  Once 03/02/22 1238 03/02/22 1437   03/02/22 1245  metroNIDAZOLE (FLAGYL) IVPB 500 mg        500 mg 100 mL/hr over 60 Minutes Intravenous Every 12 hours 03/02/22 1238          Assessment/Plan Cholelithiasis Elevated LFT's - HIDA negative for Cholecystitis. No need for abx or intervention from our standpoint. We will sign off.  - Defer further workup of elevated lft's to GI - Call back with any questions or concerns.    ID - rocephin/flagyl (can d/c from our standpoint but will defer to primary as there was question of pna on admission imaging) VTE - SCDs/ per primary FEN - IVF, Reg Foley - none   B/l Pleural effusions  ? PNA Elevated CK Elevated Cr HTN Hypothyroidism GERD Severe Alzheimer's Dementia   LOS: 2 days    Jillyn Ledger , [facility] Surgery 03/04/2022, 8:12 AM Please see Amion for pager number during day hours 7:00am-4:30pm

## 2022-03-04 NOTE — Telephone Encounter (Signed)
Current palliative care patient hospitalized. Hospital liaison team to follow up

## 2022-03-04 NOTE — TOC Initial Note (Addendum)
Transition of Care [facility]) - Initial/Assessment Note    Patient Details  Name: April Stevenson MRN: [id number] Date of Birth: [birth year]  [facility] CM/SW Contact:    Marilu Favre, RN Phone Number: 03/04/2022, 10:48 AM  Clinical Narrative:                 Spoke to patient's on Tony [phone] regarding PT recommendations for HHPT.   Patient has had [facility] in the past.   [name] has questions regarding his mother's medical conditions. He would like a palliative care consult.   NCM secure chatted attending and [facility] and asked them to call son.   NCM explained patient could have home health and palliative at home at the same time, but not home health and hospice.    If patient discharges with home health he does want [facility]. Anderson Malta with [facility] aware and will need orders and face to face for RN,PT,OT, and aide.   Patient active with [facility] with [facility] talked with son and [facility], patient does not qualify for hospice. Plan home health and palliative care through [facility]   Expected Discharge Plan: [place]     Patient Goals and CMS Choice     Choice offered to / list presented to : Adult Children  Expected Discharge Plan and Services Expected Discharge Plan: [place]   Discharge Planning Services: CM Consult Post Acute Care Choice: [place] arrangements for the past 2 months: Single Family Home                   DME Agency: NA                  Prior Living Arrangements/Services Living arrangements for the past 2 months: Single Family Home                     Activities of Daily Living Home Assistive Devices/Equipment: None ADL Screening (condition at time of admission) Patient's cognitive ability adequate to safely complete daily activities?: Yes Is the patient deaf or have difficulty hearing?: Yes Does the patient have  difficulty seeing, even when wearing glasses/contacts?: Yes Does the patient have difficulty concentrating, remembering, or making decisions?: Yes Patient able to express need for assistance with ADLs?: Yes Does the patient have difficulty dressing or bathing?: Yes Independently performs ADLs?: No Communication: Independent Dressing (OT): Needs assistance Is this a change from baseline?: Pre-admission baseline Grooming: Needs assistance Is this a change from baseline?: Pre-admission baseline Feeding: Needs assistance Is this a change from baseline?: Pre-admission baseline Bathing: Needs assistance Is this a change from baseline?: Pre-admission baseline Toileting: Needs assistance Is this a change from baseline?: Pre-admission baseline In/Out Bed: Needs assistance Is this a change from baseline?: Pre-admission baseline Walks in Home: Needs assistance Is this a change from baseline?: Pre-admission baseline Does the patient have difficulty walking or climbing stairs?: Yes Weakness of Legs: Both Weakness of Arms/Hands: Both  Permission Sought/Granted                  Emotional Assessment              Admission diagnosis:  Acute cholecystitis [K81.0] Cholecystitis [K81.9] LFT elevation [R79.89] Patient Active Problem List   Diagnosis Date Noted   Acute cholecystitis    Cholelithiasis 03/02/2022   Thrombocytopenia ([place]) 03/02/2022   Constipation 03/02/2022   CAP (community acquired pneumonia)  03/02/2022   Abdominal pain 03/02/2022   LFT elevation    Exudative age-related macular degeneration of right eye with inactive scar ([place]) 11/14/2021   Syncope 05/21/2021   Fall 05/21/2021   Closed posterior displaced Type II dens fracture ([place]) 05/21/2021   Bradycardia 05/21/2021   DNR (do not resuscitate) 05/21/2021   CKD (chronic kidney disease), stage III ([place]) 05/21/2021   Chronic venous insufficiency 09/19/2019   Pulmonary HTN ([place]) 03/15/2019   Dry eyes, bilateral  07/18/2018   Bilateral nonexudative age-related macular degeneration 07/18/2018   Paranoid delusion ([place]) 07/18/2018   Benign essential tremor 07/18/2018   Late onset Alzheimer's disease with behavioral disturbance ([place]) 04/23/2018   Senile osteoporosis 04/23/2018   [place] behavior 04/23/2018   Psychophysiological insomnia 04/23/2018   DDD (degenerative disc disease), lumbar 04/23/2018   Major depressive disorder 07/29/2016   Hiatal hernia with GERD 07/29/2011   Essential hypertension 07/29/2011   Hypothyroidism 07/29/2011   LBBB (left bundle branch block) 07/29/2011   Pure hypercholesterolemia 07/29/2011   PCP:  Sandrea Hughs, NP Pharmacy:   [facility] [place], [place] [id number] [place] [address] Phone: [phone] Fax: [phone]     Social Determinants of Health (SDOH) Interventions    Readmission Risk Interventions     View : No data to display.

## 2022-03-04 NOTE — Consult Note (Signed)
Palliative Medicine Inpatient Consult Note  Consulting Provider: Hosie Poisson, MD  Reason for consult:   [place] Palliative Medicine Consult  Reason for Consult? goals of care   HPI:  Per intake H&P --> April Stevenson is a 88 y.o. female with medical history significant of hypertension, hyperlipidemia, hypothyroidism, Alzheimer's dementia, and hiatal hernia with GERD who presents after being found down this morning. She also reported nausea, vomiting and abdominal pain. CT scan of the head and cervical spine did not note any acute abnormalities, but did note a chronic ununited type II dens fracture unchanged from prior imaging from 05/2021.  Chest x-ray noted bibasilar atelectasis/consolidation with small left-sided pleural effusion and mild cardiomegaly.  CT scan of the abdomen pelvis noted findings highly suspicious for acute cholecystitis, rectal stool ball suggesting constipation or fecal impaction, small hiatal hernia .  Palliative care has been asked to get involved to continue with goals of care conversations in the setting of advance age and co-morbidities.   [facility] OP Palliative last saw [name]   Clinical Assessment/Goals of Care:  *Please note that this is a verbal dictation therefore any spelling or grammatical errors are due to the "[employer]" system interpretation.  I have reviewed medical records including EPIC notes, labs and imaging, received report from bedside RN, assessed the patient.    I met with patients son April Stevenson to further discuss diagnosis prognosis, GOC, EOL wishes, disposition and options.   I introduced Palliative Medicine as specialized medical care for people living with serious illness. It focuses on providing relief from the symptoms and stress of a serious illness. The goal is to improve quality of life for both the patient and the family.  Medical History Review and Understanding:  April Stevenson shares that  April Stevenson has had dementia since 2015 from what he knows. She hid it well until then when her spouse died and she was no longer able to mask to signs. She additionally has HTN, HLD, hypothyroidism, and GERD.  Aiden was acutely admitted following a fall in the home. She has also suffered from an aspiration event.  Social History:  April Stevenson is from [place] originally. She formerly was a Landscape architect, singer. She has been married three times and has one son, April Stevenson. She loves playing games and her son arranged for someone to come in 4 hours daily from 1-5PM to play games with [place].   Functional and Nutritional State:  Prior to hospitalization, [name] was able to walk with a walker. She frequently had episodes of incontinence. She requires help with feeding presently due to left sided weakness. She had a fair appetite though it has been diminishing.   Advance Directives:  A detailed discussion was had today regarding advanced directives.  These can be found in [place].   Code Status:  MOST reviewed on file and a new one completed as below:  Cardiopulmonary Resuscitation: Do Not Attempt Resuscitation (DNR/No CPR)  Medical Interventions: Comfort Measures: Keep clean, warm, and dry. Use medication by any route, positioning, wound care, and other measures to relieve pain and suffering. Use oxygen, suction and manual treatment of airway obstruction as needed for comfort. Do not transfer to the hospital unless comfort needs cannot be met in current location.  Antibiotics: Determine use of limitation of antibiotics when infection occurs  IV Fluids: IV fluids for a defined trial period  Feeding Tube: No feeding tube   Discussion:  Patient son expresses a variety of concerns. We had a long  discussion of dementia and prognosis associated with it. I shared it can be difficult to determine prognosis as dementia can go on for many years. We reviewed that April Stevenson feels his mother most likely suffers from LBD  though does not seem to have significant behavioral issues. She does hallucinate, have shuffled gait as well as a right sided hand tremor.   We reviewed the various stages of dementia and how patients decline functionally, nutritionally, and often start to have increased incidence of hospitalizations due to infections.   We reviewed when to use and not use antibiotics.  Discussed signs to look for as patients are getting closer to end of life.  I shared right now, I am not confident April Stevenson will meet hospice criteria but I can request to [facility] looks into this.   Discussed when to transition care from home to a nursing facility. Reviewed that April Stevenson will know when it's appropriate.  Discussed the importance of continued conversation with family and their  medical providers regarding overall plan of care and treatment options, ensuring decisions are within the context of the patients values and GOCs.  Decision Maker: Su Monks (son) [phone]  SUMMARY OF RECOMMENDATIONS   DNAR/DNI  MOST completed  Discussed OP Palliative versus hospice  Reviewed when it may be time to consider placing April Stevenson in a facility  Appreciate coordination with OP Palliative - [facility] for bimonthly visits  Ongoing support  Code Status/Advance Care Planning: DNAR/DNI  Palliative Prophylaxis:  Aspiration, Bowel Regimen, Delirium Protocol, Frequent Pain Assessment, Oral Care, Palliative Wound Care, and Turn Reposition  Additional Recommendations (Limitations, Scope, Preferences): Treat the treatable  Psycho-social/Spiritual:  Desire for further Chaplaincy support: Not presently.  Additional Recommendations: Education on progression of dementia.    Prognosis: Patient has been declining functionally and nutritionally. She is not quite meeting hospice criteria though does seem to have had an overall downfall.   Discharge Planning: Discharge will likely be home with [facility] and OP  Palliative care support  Vitals:   03/04/22 0417 03/04/22 1133  BP: (!) 158/59 (!) 159/65  Pulse: 64 (!) 59  Resp: 18 18  Temp: (!) 97.5 F (36.4 C) 98.7 F (37.1 C)  SpO2: 96% 97%    Intake/Output Summary (Last 24 hours) at 03/04/2022 1339 Last data filed at 03/04/2022 0425 Gross per 24 hour  Intake 240 ml  Output --  Net 240 ml   Last Weight  Most recent update: 03/04/2022  5:29 AM    Weight  58.2 kg (128 lb 4.9 oz)            Gen:  Elderly F in NAD HEENT: moist mucous membranes CV: Regular rate and rhythm  PULM: ON 2LPM [place], breathing even and nonlabored ABD: soft/nontender EXT: No edema Neuro: Alert and oriented to self  PPS: 40%   This conversation/these recommendations were discussed with patient primary care team, Dr. Karleen Hampshire  Total Time: 116  Billing based on MDM: High  Problems Addressed: One acute or chronic illness or injury that poses a threat to life or bodily function  Amount and/or Complexity of Data: Category 3:Discussion of management or test interpretation with external physician/other qualified health care professional/appropriate source (not separately reported)  Risks: Decision not to resuscitate or to de-escalate care because of poor prognosis ______________________________________________________ [place] Team Team Cell Phone: [phone] Please utilize secure chat with additional questions, if there is no response within 30 minutes please call the above phone number  Palliative  Medicine Team providers are available by phone from 7am to 7pm daily and can be reached through the team cell phone.  Should this patient require assistance outside of these hours, please call the patient's attending physician.

## 2022-03-04 NOTE — Progress Notes (Signed)
[facility]) Hospital Liaison note:  This patient is currently enrolled in [facility] outpatient-based Palliative Care. Will continue to follow for disposition.  Please call with any outpatient palliative questions or concerns.  Thank you, Lorelee Market, LPN [facility] Hospital Liaison [phone]

## 2022-03-04 NOTE — Plan of Care (Signed)
  Problem: Pain Managment: Goal: General experience of comfort will improve Outcome: Progressing   Problem: Safety: Goal: Ability to remain free from injury will improve Outcome: Progressing   

## 2022-03-04 NOTE — Progress Notes (Signed)
[facility]                                                                               [employer], is a 88 y.o. female, DOB - 1924-01-11, XKG:[id number] Admit date - 03/02/2022    Outpatient Primary MD for the patient is Ngetich, Dinah C, NP  LOS - 2  days    Brief summary    April Stevenson is a 88 y.o. female with medical history significant of hypertension, hyperlipidemia, hypothyroidism, Alzheimer's dementia, and hiatal hernia with GERD who presents after being found down this morning. She also reported nausea, vomiting and abdominal pain. CT scan of the head and cervical spine did not note any acute abnormalities, but did note a chronic ununited type II dens fracture unchanged from prior imaging from 05/2021.  Chest x-ray noted bibasilar atelectasis/consolidation with small left-sided pleural effusion and mild cardiomegaly.  CT scan of the abdomen pelvis noted findings highly suspicious for acute cholecystitis, rectal stool ball suggesting constipation or fecal impaction, small hiatal hernia . patient had been given 1 L normal saline IV fluids, Rocephin, and metronidazole.  Gastroenterology and general surgery consulted  Assessment & Plan    Assessment and Plan:  Abdominal pain with nausea and vomiting, CT of the abdomen and pelvis suspicious for acute cholecystitis and fecal impaction. General surgery consulted , ordered HIDA which showed patency of the cystic duct. Pt denies any nausea, vomiting or abdominal pain.  Started on regular diet.   Transaminitis and hyperbilirubinemia Continue to monitor.   Fecal impaction/constipation Ordered dulcolax suppository. Her abd pain could be from the constipation.  Enema and bowel regimen on board.    Hypothyroidism Continue with Synthroid.  Suspected community-acquired pneumonia/left-sided pneumonia Aspiration precautions SLP evaluation recommending regular diet. Empiric IV  antibiotics. Will transition to oral Augmentin in am.     Unwitnessed fall at home Initial CT of the head is negative for acute abnormality.  Will check MRI of the brain.  Patient has significant memory deficits. Therapy evaluations ordered. Will plan for home health PT,OT, RN AID.     Stage IIIb CKD - creatinine at baseline.     Chronic diastolic heart failure Left ventricular ejection fraction on echocardiogram from 2020 showed 50 to 60%, with mild to moderate aortic valve regurgitation, mitral valve regurgitation. Continue with strict intake and output and daily weights.    Essential hypertension Blood pressure parameters are well controlled.  Continue with hydralazine and olmesartan.    History of Alzheimer's dementia Delirium precautions.  In view of her cognitive deficits, aspiration pneumonia, and deconditioning , recurrent falls. Will request palliative care consult for goals of care .   Interventions: Refer to RD note for recommendations  Estimated body mass index is 20.71 kg/m as calculated from the following:   Height as of this encounter: '5\' 6"'$  (1.676 m).   Weight as of this encounter: 58.2 kg.  Code Status: DNR.  DVT Prophylaxis:  enoxaparin (LOVENOX) injection 30 mg Start: 03/02/22 1615   Level of Care: Level of care: Telemetry Medical Family Communication:  none at bedside.   Disposition Plan:     Remains inpatient appropriate:  IV antibiotics for  left sided pneumonia.   Procedures:  Hida scan  Consultants:   GI SURGERY. Palliative care .   Antimicrobials:   Anti-infectives (From admission, onward)    Start     Dose/Rate Route Frequency Ordered Stop   03/03/22 1200  cefTRIAXone (ROCEPHIN) 2 g in sodium chloride 0.9 % 100 mL IVPB        2 g 200 mL/hr over 30 Minutes Intravenous Every 24 hours 03/02/22 1546     03/02/22 1245  cefTRIAXone (ROCEPHIN) 2 g in sodium chloride 0.9 % 100 mL IVPB        2 g 200 mL/hr over 30 Minutes Intravenous   Once 03/02/22 1238 03/02/22 1437   03/02/22 1245  metroNIDAZOLE (FLAGYL) IVPB 500 mg        500 mg 100 mL/hr over 60 Minutes Intravenous Every 12 hours 03/02/22 1238          Medications  Scheduled Meds:  enoxaparin (LOVENOX) injection  30 mg Subcutaneous Q24H   FLUoxetine  20 mg Oral Daily   levothyroxine  25 mcg Oral QHS   melatonin  10 mg Oral QHS   multivitamin with minerals  1 tablet Oral Daily   pantoprazole  40 mg Oral Daily   senna-docusate  1 tablet Oral QHS   sodium chloride flush  3 mL Intravenous Q12H   traZODone  100 mg Oral QHS   Continuous Infusions:  cefTRIAXone (ROCEPHIN)  IV 2 g (03/03/22 1157)   lactated ringers 75 mL/hr at 03/04/22 0034   metronidazole 500 mg (03/04/22 0038)   PRN Meds:.acetaminophen **OR** acetaminophen, albuterol, hydrOXYzine, LORazepam, ondansetron **OR** ondansetron (ZOFRAN) IV, polyvinyl alcohol    Subjective:   April Stevenson was seen and examined today. Appears calm, denies any pain. Wants to know when she can go home.  Objective:   Vitals:   03/03/22 1541 03/03/22 2017 03/04/22 0417 03/04/22 0529  BP: (!) 157/86 (!) 155/95 (!) 158/59   Pulse: 77 76 64   Resp: '18 14 18   '$ Temp: 98.2 F (36.8 C) 98.3 F (36.8 C) (!) 97.5 F (36.4 C)   TempSrc: Oral Oral Oral   SpO2: 96% 96% 96%   Weight:    58.2 kg  Height:        Intake/Output Summary (Last 24 hours) at 03/04/2022 1111 Last data filed at 03/04/2022 0425 Gross per 24 hour  Intake 240 ml  Output --  Net 240 ml    Filed Weights   03/02/22 1712 03/03/22 0500 03/04/22 0529  Weight: 60 kg 58 kg 58.2 kg     Exam: General exam: Elderly woman not in distress.  Respiratory system: Clear to auscultation. Respiratory effort normal. Cardiovascular system: S1 & S2 heard, RRR. No JVD, No pedal edema. Gastrointestinal system: Abdomen is nondistended, soft and nontender. Normal bowel sounds heard. Central nervous system: Alert and oriented to person only.   Extremities: no pedal edema.  Skin: No rashes seen.  Psychiatry:  Mood & affect appropriate.     Data Reviewed:  I have personally reviewed following labs and imaging studies   CBC Lab Results  Component Value Date   WBC 6.3 03/04/2022   RBC 3.33 (L) 03/04/2022   HGB 10.6 (L) 03/04/2022   HCT 31.5 (L) 03/04/2022   MCV 94.6 03/04/2022   MCH 31.8 03/04/2022   PLT 120 (L) 03/04/2022   MCHC 33.7 03/04/2022   RDW 13.3 03/04/2022   LYMPHSABS 0.2 (L) 03/02/2022   MONOABS 0.9 03/02/2022   EOSABS 0.0  03/02/2022   BASOSABS 0.0 81/82/9937     Last metabolic panel Lab Results  Component Value Date   NA 140 03/04/2022   K 3.6 03/04/2022   CL 109 03/04/2022   CO2 23 03/04/2022   BUN 25 (H) 03/04/2022   CREATININE 0.99 03/04/2022   GLUCOSE 85 03/04/2022   GFRNONAA 53 (L) 03/04/2022   GFRAA 41 (L) 01/14/2021   CALCIUM 8.3 (L) 03/04/2022   PHOS 3.7 05/23/2021   PROT 5.0 (L) 03/04/2022   ALBUMIN 2.3 (L) 03/04/2022   BILITOT 1.3 (H) 03/04/2022   ALKPHOS 195 (H) 03/04/2022   AST 56 (H) 03/04/2022   ALT 173 (H) 03/04/2022   ANIONGAP 8 03/04/2022    CBG (last 3)  Recent Labs    03/02/22 1052  GLUCAP 158*       Coagulation Profile: Recent Labs  Lab 03/02/22 1642  INR 1.8*      Radiology Studies: CT HEAD WO CONTRAST  Result Date: 03/02/2022 CLINICAL DATA:  Neck trauma (Age >= 65y); Head trauma, minor (Age >= 65y). History of dens fracture. EXAM: CT HEAD WITHOUT CONTRAST CT CERVICAL SPINE WITHOUT CONTRAST TECHNIQUE: Multidetector CT imaging of the head and cervical spine was performed following the standard protocol without intravenous contrast. Multiplanar CT image reconstructions of the cervical spine were also generated. RADIATION DOSE REDUCTION: This exam was performed according to the departmental dose-optimization program which includes automated exposure control, adjustment of the mA and/or kV according to patient size and/or use of iterative reconstruction  technique. COMPARISON:  05/21/2021 FINDINGS: CT HEAD FINDINGS Brain: No evidence of acute infarction, hemorrhage, hydrocephalus, extra-axial collection or mass lesion/mass effect. Scattered low-density changes within the periventricular and subcortical white matter compatible with chronic microvascular ischemic change. Mild diffuse cerebral volume loss. Vascular: No hyperdense vessel or unexpected calcification. Skull: Normal. Negative for fracture or focal lesion. Sinuses/Orbits: No acute finding. Other: Negative for scalp hematoma. CT CERVICAL SPINE FINDINGS Alignment: Unchanged mild posterior subluxation of C1 relative to C2. Unchanged grade 1 anterolisthesis of C3 on C4. No facet dislocation. Skull base and vertebrae: Chronic ununited type 2 dens fracture. The dens remains mildly displaced posteriorly relative to the body of C2, unchanged. No new or acute fractures of the cervical spine. No focal lytic or sclerotic bone lesion. Soft tissues and spinal canal: No prevertebral fluid or swelling. No visible canal hematoma. Disc levels: Degenerative disc disease most pronounced at C5-6. Multilevel bilateral facet arthropathy, left worse than right. Progressive arthropathy at the C1-2 articulation. Upper chest: Negative. Other: None. IMPRESSION: 1. No acute intracranial abnormality. 2. No acute cervical spine fracture. 3. Chronic ununited type 2 dens fracture, unchanged from prior. 4. Chronic microvascular ischemic change and cerebral volume loss. Electronically Signed   By: Davina Poke D.O.   On: 03/02/2022 11:42   CT CERVICAL SPINE WO CONTRAST  Result Date: 03/02/2022 CLINICAL DATA:  Neck trauma (Age >= 65y); Head trauma, minor (Age >= 65y). History of dens fracture. EXAM: CT HEAD WITHOUT CONTRAST CT CERVICAL SPINE WITHOUT CONTRAST TECHNIQUE: Multidetector CT imaging of the head and cervical spine was performed following the standard protocol without intravenous contrast. Multiplanar CT image  reconstructions of the cervical spine were also generated. RADIATION DOSE REDUCTION: This exam was performed according to the departmental dose-optimization program which includes automated exposure control, adjustment of the mA and/or kV according to patient size and/or use of iterative reconstruction technique. COMPARISON:  05/21/2021 FINDINGS: CT HEAD FINDINGS Brain: No evidence of acute infarction, hemorrhage, hydrocephalus, extra-axial collection or  mass lesion/mass effect. Scattered low-density changes within the periventricular and subcortical white matter compatible with chronic microvascular ischemic change. Mild diffuse cerebral volume loss. Vascular: No hyperdense vessel or unexpected calcification. Skull: Normal. Negative for fracture or focal lesion. Sinuses/Orbits: No acute finding. Other: Negative for scalp hematoma. CT CERVICAL SPINE FINDINGS Alignment: Unchanged mild posterior subluxation of C1 relative to C2. Unchanged grade 1 anterolisthesis of C3 on C4. No facet dislocation. Skull base and vertebrae: Chronic ununited type 2 dens fracture. The dens remains mildly displaced posteriorly relative to the body of C2, unchanged. No new or acute fractures of the cervical spine. No focal lytic or sclerotic bone lesion. Soft tissues and spinal canal: No prevertebral fluid or swelling. No visible canal hematoma. Disc levels: Degenerative disc disease most pronounced at C5-6. Multilevel bilateral facet arthropathy, left worse than right. Progressive arthropathy at the C1-2 articulation. Upper chest: Negative. Other: None. IMPRESSION: 1. No acute intracranial abnormality. 2. No acute cervical spine fracture. 3. Chronic ununited type 2 dens fracture, unchanged from prior. 4. Chronic microvascular ischemic change and cerebral volume loss. Electronically Signed   By: Davina Poke D.O.   On: 03/02/2022 11:42   NM Hepatobiliary Liver Func  Result Date: 03/03/2022 CLINICAL DATA:  Dilated gallbladder with  wall thickening and cholelithiasis on recent ultrasound, evaluate for cystic duct patency. EXAM: NUCLEAR MEDICINE HEPATOBILIARY IMAGING TECHNIQUE: Sequential images of the abdomen were obtained out to 60 minutes following intravenous administration of radiopharmaceutical. RADIOPHARMACEUTICALS:  7.1 mCi Tc-25m Choletec IV COMPARISON:  Ultrasound from the previous day. FINDINGS: Prompt uptake and biliary excretion of activity by the liver is seen. Biliary activity passes into small bowel, consistent with patent common bile duct. Gallbladder is not visualized. 2.3 mg of morphine was then administered in an effort to shunt biliary tracer into the gallbladder. Delayed images demonstrate visualization of the gallbladder consistent with cystic duct patency. IMPRESSION: Normal uptake and excretion of biliary tracer. Initial nonvisualization of the gallbladder with subsequent visualization following morphine administration indicating cystic duct patency. Electronically Signed   By: MInez CatalinaM.D.   On: 03/03/2022 19:30   CT ABDOMEN PELVIS W CONTRAST  Result Date: 03/02/2022 CLINICAL DATA:  acute hepatitis.  Fall. EXAM: CT ABDOMEN AND PELVIS WITH CONTRAST TECHNIQUE: Multidetector CT imaging of the abdomen and pelvis was performed using the standard protocol following bolus administration of intravenous contrast. RADIATION DOSE REDUCTION: This exam was performed according to the departmental dose-optimization program which includes automated exposure control, adjustment of the mA and/or kV according to patient size and/or use of iterative reconstruction technique. CONTRAST:  621mOMNIPAQUE IOHEXOL 350 MG/ML SOLN COMPARISON:  None Available. FINDINGS: Lower chest: Lingular scarring or atelectasis. Dependent left lower lobe airspace disease. Small left pleural effusion. Trace right pleural fluid. Mild cardiomegaly. Small hiatal hernia. Presumed pericardial cyst of 4.6 cm in the right cardiophrenic angle. Hepatobiliary:  Nonspecific caudate lobe enlargement. Hepatic calcifications which are likely related to prior infection or inflammation. Probable hepatic steatosis. Subtle gallstones within the fundus. Gallbladder wall thickening and pericholecystic edema including on 31/3. No biliary duct dilatation. Pancreas: Normal pancreas for age. Spleen: Subcentimeter hypoattenuating splenic lesions are nonspecific but of doubtful clinical significance. No splenomegaly. Adrenals/Urinary Tract: Normal adrenal glands. Too small to characterize interpolar left renal lesion. Normal right kidney for age. No hydronephrosis. Normal urinary bladder. Stomach/Bowel: Gastric body underdistention. Rectal stool ball of 8.7 cm. Normal small bowel. Vascular/Lymphatic: Advanced aortic and branch vessel atherosclerosis. No abdominopelvic adenopathy. Reproductive: Hysterectomy.  No adnexal mass. Other: No  significant free fluid.  No free intraperitoneal air. Musculoskeletal: Lumbar spondylosis with S-shaped thoracolumbar spine curvature. IMPRESSION: 1. No posttraumatic deformity identified. 2. Findings highly suspicious for acute cholecystitis. 3. Rectal stool ball suggests constipation or fecal impaction. 4. Small hiatal hernia. Gastric body apparent wall thickening is at least partially felt to be due to underdistention. Correlate with symptoms of gastritis. 5. Small left and trace right pleural effusions with dependent left base airspace disease. Suspicious for mild pneumonia or aspiration with compressive atelectasis felt less likely. 6. Probable hepatic steatosis. Electronically Signed   By: Abigail Miyamoto M.D.   On: 03/02/2022 13:36   US Abdomen Limited RUQ (LIVER/GB)  Result Date: 03/02/2022 CLINICAL DATA:  Right upper quadrant pain. EXAM: ULTRASOUND ABDOMEN LIMITED RIGHT UPPER QUADRANT COMPARISON:  CT abdomen and pelvis 03/02/2022 FINDINGS: Gallbladder: Gallstones are present. Multiple small layering gallstones are present measuring up to 3 mm.  There is gallbladder wall thickening measuring 4 mm. Sonographic Percell Miller sign is negative. Common bile duct: Diameter: 7 mm.  (Within normal limits). Liver: There is a 10 x 8 x 8 mm hypoechoic mass in the right lobe of the liver without vascularity. Within normal limits in parenchymal echogenicity. Portal vein is patent on color Doppler imaging with normal direction of blood flow towards the liver. Other: None. IMPRESSION: 1. Cholelithiasis with gallbladder wall thickening. Negative sonographic Murphy sign. No biliary ductal dilatation. Findings may related to acute or chronic cholecystitis. Correlate clinically. 2. 1 cm hypoechoic mass in the right lobe of the liver favored as hemangioma. Electronically Signed   By: Ronney Asters M.D.   On: 03/02/2022 17:07       Hosie Poisson M.D. [facility] 03/04/2022, 11:11 AM  Available via Epic secure chat 7am-7pm After 7 pm, please refer to night coverage provider listed on amion.

## 2022-03-05 ENCOUNTER — Inpatient Hospital Stay (HOSPITAL_COMMUNITY): Payer: Medicare Other

## 2022-03-05 DIAGNOSIS — K8020 Calculus of gallbladder without cholecystitis without obstruction: Secondary | ICD-10-CM | POA: Diagnosis not present

## 2022-03-05 DIAGNOSIS — K5900 Constipation, unspecified: Secondary | ICD-10-CM | POA: Diagnosis not present

## 2022-03-05 DIAGNOSIS — J189 Pneumonia, unspecified organism: Secondary | ICD-10-CM | POA: Diagnosis not present

## 2022-03-05 DIAGNOSIS — R109 Unspecified abdominal pain: Secondary | ICD-10-CM | POA: Diagnosis not present

## 2022-03-05 LAB — POTASSIUM: Potassium: 3.5 mmol/L (ref 3.5–5.1)

## 2022-03-05 LAB — MAGNESIUM: Magnesium: 1.8 mg/dL (ref 1.7–2.4)

## 2022-03-05 IMAGING — DX DG CHEST 1V PORT
1 series · 1 of 1 positions shown · non-contrast
Comparison: Previous studies including the examination of
[DATE]

CLINICAL DATA: Hypoxia

EXAM:
PORTABLE CHEST 1 VIEW

[chest]
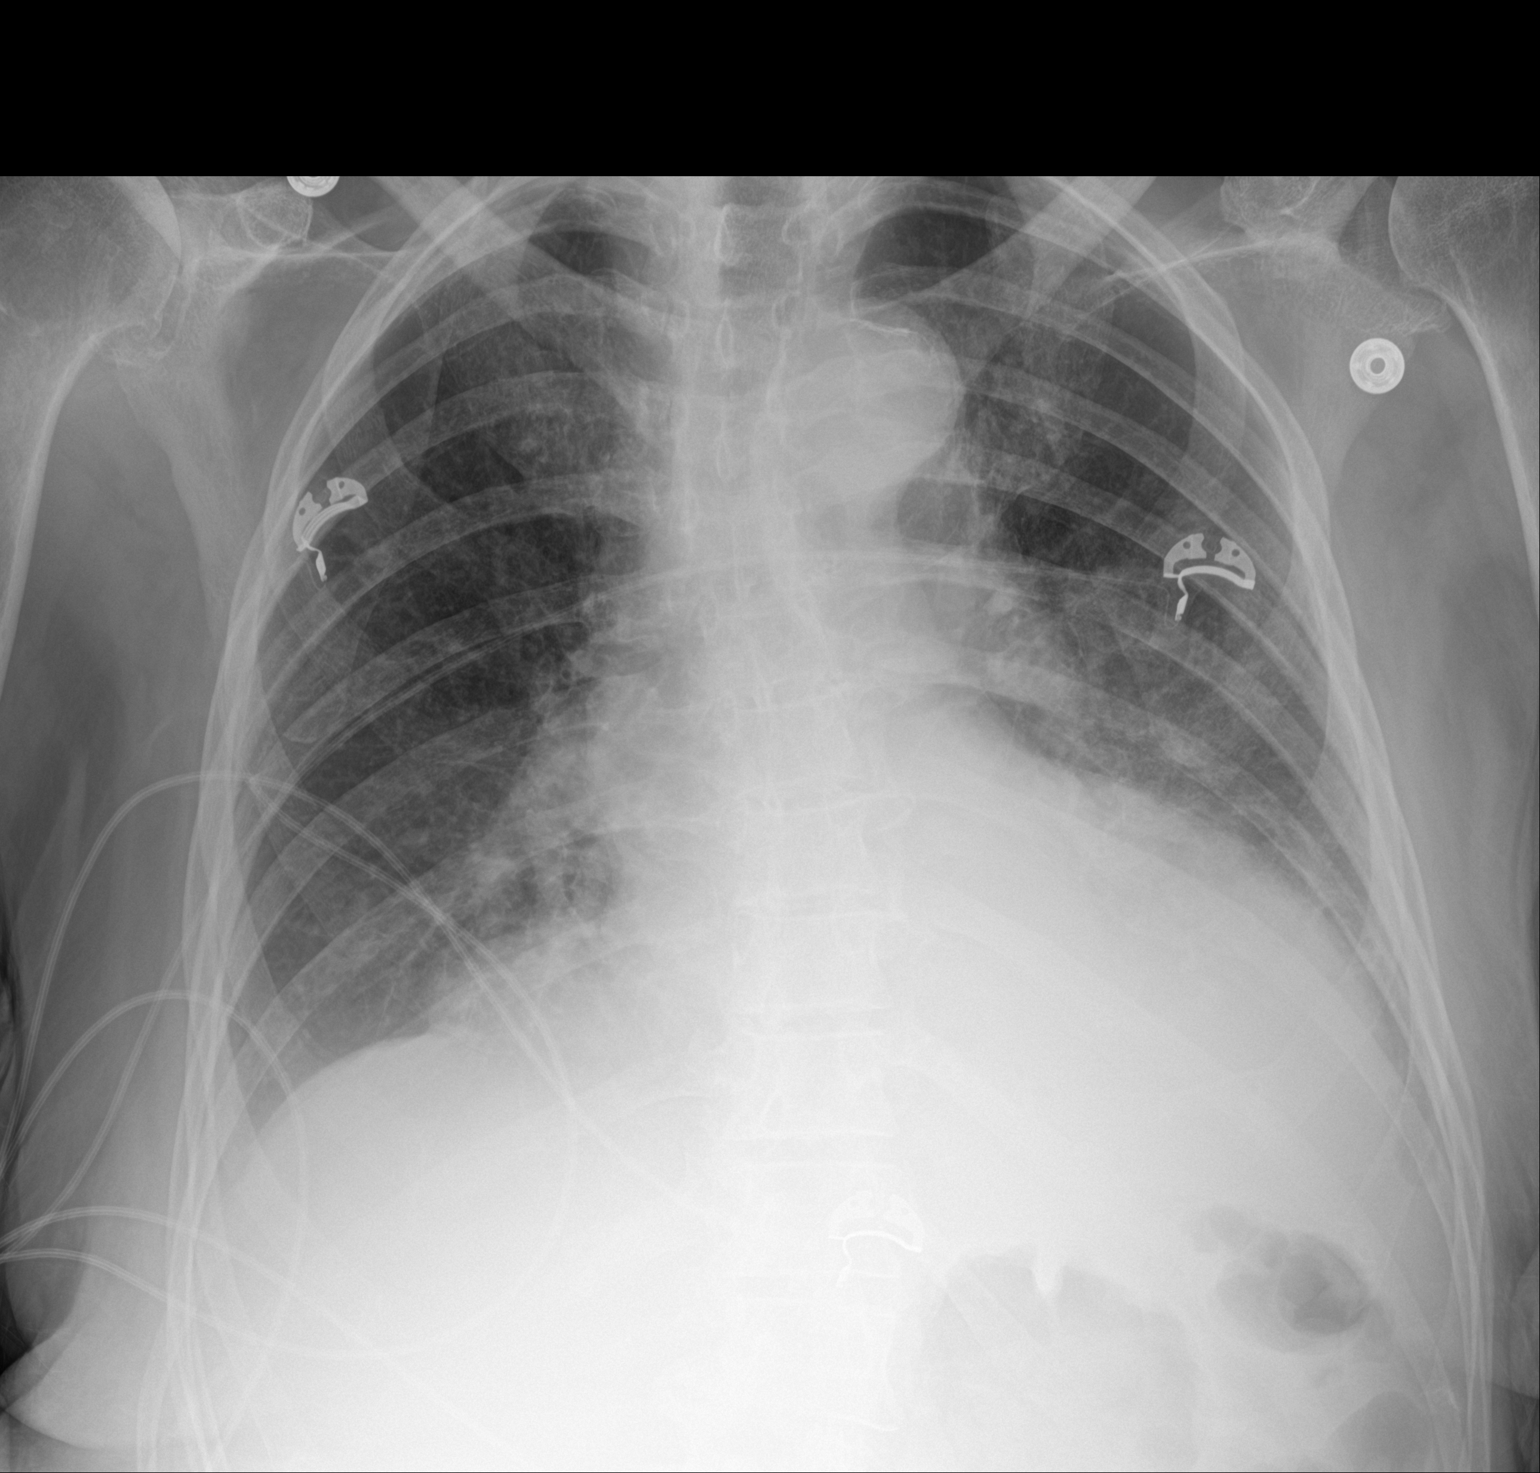

[1 of 1 positions shown; findings below may reference images not displayed]

FINDINGS: Transverse diameter of heart is increased. Central pulmonary vessels
are prominent. Increased density is seen in the left lower lung
fields and medial right lower lung fields. Lateral CP angles are
indistinct. There is no pneumothorax.
IMPRESSION: Cardiomegaly. There are no signs of alveolar pulmonary edema.
Increased density is seen in the both lower lung fields with
interval worsening suggesting progression of atelectasis/pneumonia
and possibly increase in bilateral pleural effusions.

## 2022-03-05 MED ORDER — LEVOFLOXACIN 500 MG PO TABS: 500.0000 mg | ORAL_TABLET | Freq: Every day | ORAL | 0 refills | Status: AC

## 2022-03-05 MED ORDER — HYDROXYZINE HCL 10 MG PO TABS: 10.0000 mg | ORAL_TABLET | Freq: Three times a day (TID) | ORAL | 0 refills | Status: AC | PRN

## 2022-03-05 NOTE — Plan of Care (Signed)
  Problem: Nutrition: Goal: Adequate nutrition will be maintained Outcome: Progressing   Problem: Pain Managment: Goal: General experience of comfort will improve Outcome: Progressing   Problem: Safety: Goal: Ability to remain free from injury will improve Outcome: Progressing   

## 2022-03-05 NOTE — Discharge Summary (Signed)
Physician Discharge Summary   Patient: April Stevenson MRN: [id number] DOB: Aug 18, 1924  Admit date:     03/02/2022  Discharge date: 03/05/22  Discharge Physician: Hosie Poisson   PCP: Sandrea Hughs, NP   Recommendations at discharge:  Please follow up with PCP in one week.  Please follow up with home health Please follow up with outpatient palliative care services.  Please follow up with General surgery if she has recurrent right upper quadrant abdominal pain.    Discharge Diagnoses: Principal Problem:   Abdominal pain Active Problems:   Cholelithiasis   CAP (community acquired pneumonia)   Essential hypertension   Hypothyroidism   Late onset Alzheimer's disease with behavioral disturbance ([facility])   Fall   Thrombocytopenia ([place])   Constipation   LFT elevation   Acute cholecystitis    Hospital Course: April Stevenson is a 88 y.o. female with medical history significant of hypertension, hyperlipidemia, hypothyroidism, Alzheimer's dementia, and hiatal hernia with GERD who presents after being found down this morning. She also reported nausea, vomiting and abdominal pain. CT scan of the head and cervical spine did not note any acute abnormalities, but did note a chronic ununited type II dens fracture unchanged from prior imaging from 05/2021.  Chest x-ray noted bibasilar atelectasis/consolidation with small left-sided pleural effusion and mild cardiomegaly.  CT scan of the abdomen pelvis noted findings highly suspicious for acute cholecystitis, rectal stool ball suggesting constipation or fecal impaction, small hiatal hernia . patient had been given 1 L normal saline IV fluids, Rocephin, and metronidazole.  Gastroenterology and general surgery consulted Assessment and Plan:  Abdominal pain with nausea and vomiting, CT of the abdomen and pelvis suspicious for acute cholecystitis and fecal impaction. General surgery consulted , ordered HIDA which showed patency of the  cystic duct. Pt denies any nausea, vomiting or abdominal pain.  Started on regular diet.    Transaminitis and hyperbilirubinemia RUQ US showing gall stones with GB wall thickening. But HIDA is negative for acute cholecystitis.  Given down trend of liver enzymes, negative viral panel, GI suggested no further work up at this time Continue to monitor liver enzymes on discharge in 1 week to make sure they have normalized.     Fecal impaction/constipation Ordered dulcolax suppository. Her abd pain could be from the constipation.  Enema and bowel regimen on board.       Hypothyroidism Continue with Synthroid.   Suspected community-acquired pneumonia/left-sided pneumonia Aspiration precautions SLP evaluation recommending regular diet. Empiric IV antibiotics. Transitioned to oral antibiotics on discharge to complete the course.        Unwitnessed fall at home Initial CT of the head is negative for acute abnormality.  Will check MRI of the brain.  Patient has significant memory deficits. Therapy evaluations ordered. Will plan for home health PT,OT, RN AID.        Stage IIIb CKD - creatinine at baseline.        Chronic diastolic heart failure Left ventricular ejection fraction on echocardiogram from 2020 showed 50 to 60%, with mild to moderate aortic valve regurgitation, mitral valve regurgitation. Continue with strict intake and output and daily weights. Resume lasix on discharge. Patient's son reports that patient sometimes she does not eat or drink at all due to lack of appetite. Recommend holding lasix if SBP is less than 90 mmhg.       Essential hypertension Blood pressure parameters are well controlled.  Continue with hydralazine and olmesartan.       History of Alzheimer's  dementia Delirium precautions.   In view of her cognitive deficits, aspiration pneumonia, and deconditioning , recurrent falls. Requested palliative care consult for goals of care .     Interventions: Refer to RD note for recommendations   Estimated body mass index is 20.71 kg/m as calculated from the following:   Height as of this encounter: '5\' 6"'$  (1.676 m).   Weight as of this encounter: 58.2 kg.        Consultants: general surgery.  Procedures performed: none.   Disposition: Home Diet recommendation:  Discharge Diet Orders (From admission, onward)     Start     Ordered   03/05/22 0000  Diet - low sodium heart healthy        03/05/22 1127           Regular diet DISCHARGE MEDICATION: Allergies as of 03/05/2022       Reactions   Garlic Other (See Comments)   unknown   Lac Bovis Other (See Comments)   unknown   Lanolin Other (See Comments)   unknown   Statins Other (See Comments)   myalgia   Penicillins Rash        Medication List     STOP taking these medications    donepezil 10 MG tablet Commonly known as: ARICEPT       TAKE these medications    calcium carbonate 600 MG Tabs tablet Commonly known as: OS-CAL Take 600 mg by mouth daily with breakfast.   Cholecalciferol 50 MCG (2000 UT) Caps Commonly known as: D3 High Potency Take 1 capsule (2,000 Units total) by mouth daily.   FLUoxetine 20 MG tablet Commonly known as: PROZAC TAKE 1 TABLET(20 MG) BY MOUTH DAILY What changed: See the new instructions.   furosemide 20 MG tablet Commonly known as: LASIX Take 1 tablet (20 mg total) by mouth daily.   hydrALAZINE 25 MG tablet Commonly known as: APRESOLINE Take 1 tablet (25 mg total) by mouth every 8 (eight) hours. What changed: when to take this   hydrOXYzine 10 MG tablet Commonly known as: ATARAX Take 1 tablet (10 mg total) by mouth 3 (three) times daily as needed for anxiety.   irbesartan 300 MG tablet Commonly known as: AVAPRO TAKE 1 TABLET(300 MG) BY MOUTH DAILY What changed: See the new instructions.   levofloxacin 500 MG tablet Commonly known as: Levaquin Take 1 tablet (500 mg total) by mouth daily. Start  taking on: Mar 06, 2022   levothyroxine 25 MCG tablet Commonly known as: SYNTHROID TAKE 1 TABLET(25 MCG) BY MOUTH AT BEDTIME What changed: See the new instructions.   Melatonin 10 MG Tabs Take 1 tablet by mouth at bedtime. What changed: how much to take   omeprazole 20 MG capsule Commonly known as: PRILOSEC TAKE 1 CAPSULE(20 MG) BY MOUTH DAILY What changed: See the new instructions.   potassium chloride SA 20 MEQ tablet Commonly known as: KLOR-CON M TAKE 1 TABLET BY MOUTH EVERY DAY   PRESERVISION AREDS PO Take 1 capsule by mouth daily.   SYSTANE COMPLETE OP Place 1 drop into both eyes 4 (four) times daily as needed (irritation, burning).   traZODone 100 MG tablet Commonly known as: DESYREL Take 1 tablet (100 mg total) by mouth at bedtime.   vitamin C 500 MG tablet Commonly known as: ASCORBIC ACID Take 1 tablet (500 mg total) by mouth daily.               Durable Medical Equipment  (From admission, onward)  Start     Ordered   03/05/22 1206  For home use only DME lightweight manual wheelchair with seat cushion  Once       Comments: Patient suffers from pneumonia, abdominal pain, and alzheimer's which impairs their ability to perform daily activities like bathing, dressing, and toileting in the home.  A walker will not resolve  issue with performing activities of daily living. A wheelchair will allow patient to safely perform daily activities. Patient is not able to propel themselves in the home using a standard weight wheelchair due to endurance and general weakness. Patient can self propel in the lightweight wheelchair. Length of need 6 months . Accessories: elevating leg rests (ELRs), wheel locks, extensions and anti-tippers.   03/05/22 1211   03/05/22 1122  For home use only DME lightweight manual wheelchair with seat cushion  Once       Comments: Patient suffers from significant cognitive impairment,  which impairs their ability to perform daily  activities like bathing and dressing in the home.  A walker will not resolve  issue with performing activities of daily living. A wheelchair will allow patient to safely perform daily activities. Patient is not able to propel themselves in the home using a standard weight wheelchair due to general weakness. Patient can self propel in the lightweight wheelchair. Length of need Lifetime. Accessories: elevating leg rests (ELRs), wheel locks, extensions and anti-tippers.   03/05/22 [place] Follow up.   Specialty: Home Health Services Contact information: [place] [id number] [facility] Follow up.   Specialty: Hospice and Palliative Medicine Contact information: [place] [phone]               Discharge Exam: Danley Danker Weights   03/03/22 0500 03/04/22 0529 03/05/22 0500  Weight: 58 kg 58.2 kg 58.2 kg   General exam: Appears calm and comfortable  Respiratory system: Clear to auscultation. Respiratory effort normal. Cardiovascular system: S1 & S2 heard, RRR. No JVD, No pedal edema. Gastrointestinal system: Abdomen is nondistended, soft and nontender. Normal bowel sounds heard. Central nervous system: Alert and oriented to self Extremities: Symmetric 5 x 5 power. Skin: No rashes, lesions or ulcers Psychiatry:  Mood & affect appropriate.    Condition at discharge: fair  The results of significant diagnostics from this hospitalization (including imaging, microbiology, ancillary and laboratory) are listed below for reference.   Imaging Studies: CT HEAD WO CONTRAST  Result Date: 03/02/2022 CLINICAL DATA:  Neck trauma (Age >= 65y); Head trauma, minor (Age >= 65y). History of dens fracture. EXAM: CT HEAD WITHOUT CONTRAST CT CERVICAL SPINE WITHOUT CONTRAST TECHNIQUE: Multidetector CT imaging of the head and  cervical spine was performed following the standard protocol without intravenous contrast. Multiplanar CT image reconstructions of the cervical spine were also generated. RADIATION DOSE REDUCTION: This exam was performed according to the departmental dose-optimization program which includes automated exposure control, adjustment of the mA and/or kV according to patient size and/or use of iterative reconstruction technique. COMPARISON:  05/21/2021 FINDINGS: CT HEAD FINDINGS Brain: No evidence of acute infarction, hemorrhage, hydrocephalus, extra-axial collection or mass lesion/mass effect. Scattered low-density changes within the periventricular and subcortical white matter compatible with chronic microvascular ischemic change. Mild diffuse cerebral volume loss. Vascular: No hyperdense vessel or unexpected calcification. Skull:  Normal. Negative for fracture or focal lesion. Sinuses/Orbits: No acute finding. Other: Negative for scalp hematoma. CT CERVICAL SPINE FINDINGS Alignment: Unchanged mild posterior subluxation of C1 relative to C2. Unchanged grade 1 anterolisthesis of C3 on C4. No facet dislocation. Skull base and vertebrae: Chronic ununited type 2 dens fracture. The dens remains mildly displaced posteriorly relative to the body of C2, unchanged. No new or acute fractures of the cervical spine. No focal lytic or sclerotic bone lesion. Soft tissues and spinal canal: No prevertebral fluid or swelling. No visible canal hematoma. Disc levels: Degenerative disc disease most pronounced at C5-6. Multilevel bilateral facet arthropathy, left worse than right. Progressive arthropathy at the C1-2 articulation. Upper chest: Negative. Other: None. IMPRESSION: 1. No acute intracranial abnormality. 2. No acute cervical spine fracture. 3. Chronic ununited type 2 dens fracture, unchanged from prior. 4. Chronic microvascular ischemic change and cerebral volume loss. Electronically Signed   By: Davina Poke D.O.   On:  03/02/2022 11:42   CT CERVICAL SPINE WO CONTRAST  Result Date: 03/02/2022 CLINICAL DATA:  Neck trauma (Age >= 65y); Head trauma, minor (Age >= 65y). History of dens fracture. EXAM: CT HEAD WITHOUT CONTRAST CT CERVICAL SPINE WITHOUT CONTRAST TECHNIQUE: Multidetector CT imaging of the head and cervical spine was performed following the standard protocol without intravenous contrast. Multiplanar CT image reconstructions of the cervical spine were also generated. RADIATION DOSE REDUCTION: This exam was performed according to the departmental dose-optimization program which includes automated exposure control, adjustment of the mA and/or kV according to patient size and/or use of iterative reconstruction technique. COMPARISON:  05/21/2021 FINDINGS: CT HEAD FINDINGS Brain: No evidence of acute infarction, hemorrhage, hydrocephalus, extra-axial collection or mass lesion/mass effect. Scattered low-density changes within the periventricular and subcortical white matter compatible with chronic microvascular ischemic change. Mild diffuse cerebral volume loss. Vascular: No hyperdense vessel or unexpected calcification. Skull: Normal. Negative for fracture or focal lesion. Sinuses/Orbits: No acute finding. Other: Negative for scalp hematoma. CT CERVICAL SPINE FINDINGS Alignment: Unchanged mild posterior subluxation of C1 relative to C2. Unchanged grade 1 anterolisthesis of C3 on C4. No facet dislocation. Skull base and vertebrae: Chronic ununited type 2 dens fracture. The dens remains mildly displaced posteriorly relative to the body of C2, unchanged. No new or acute fractures of the cervical spine. No focal lytic or sclerotic bone lesion. Soft tissues and spinal canal: No prevertebral fluid or swelling. No visible canal hematoma. Disc levels: Degenerative disc disease most pronounced at C5-6. Multilevel bilateral facet arthropathy, left worse than right. Progressive arthropathy at the C1-2 articulation. Upper chest:  Negative. Other: None. IMPRESSION: 1. No acute intracranial abnormality. 2. No acute cervical spine fracture. 3. Chronic ununited type 2 dens fracture, unchanged from prior. 4. Chronic microvascular ischemic change and cerebral volume loss. Electronically Signed   By: Davina Poke D.O.   On: 03/02/2022 11:42   MR BRAIN WO CONTRAST  Result Date: 03/04/2022 CLINICAL DATA:  Altered mental status EXAM: MRI HEAD WITHOUT CONTRAST TECHNIQUE: Multiplanar, multiecho pulse sequences of the brain and surrounding structures were obtained without intravenous contrast. COMPARISON:  None Available. FINDINGS: Brain: No acute infarct, mass effect or extra-axial collection. No acute or chronic hemorrhage. There is multifocal hyperintense T2-weighted signal within the white matter. Generalized cerebral volume loss. The midline structures are normal. Vascular: Major flow voids are preserved. Skull and upper cervical spine: Normal calvarium and skull base. Visualized upper cervical spine and soft tissues are normal. Sinuses/Orbits:No paranasal sinus fluid levels or advanced mucosal thickening. No mastoid  or middle ear effusion. Normal orbits. IMPRESSION: 1. No acute intracranial abnormality. 2. Chronic small vessel ischemia and generalized volume loss. Electronically Signed   By: Ulyses Jarred M.D.   On: 03/04/2022 20:42   NM Hepatobiliary Liver Func  Result Date: 03/03/2022 CLINICAL DATA:  Dilated gallbladder with wall thickening and cholelithiasis on recent ultrasound, evaluate for cystic duct patency. EXAM: NUCLEAR MEDICINE HEPATOBILIARY IMAGING TECHNIQUE: Sequential images of the abdomen were obtained out to 60 minutes following intravenous administration of radiopharmaceutical. RADIOPHARMACEUTICALS:  7.1 mCi Tc-64m Choletec IV COMPARISON:  Ultrasound from the previous day. FINDINGS: Prompt uptake and biliary excretion of activity by the liver is seen. Biliary activity passes into small bowel, consistent with patent  common bile duct. Gallbladder is not visualized. 2.3 mg of morphine was then administered in an effort to shunt biliary tracer into the gallbladder. Delayed images demonstrate visualization of the gallbladder consistent with cystic duct patency. IMPRESSION: Normal uptake and excretion of biliary tracer. Initial nonvisualization of the gallbladder with subsequent visualization following morphine administration indicating cystic duct patency. Electronically Signed   By: MInez CatalinaM.D.   On: 03/03/2022 19:30   CT ABDOMEN PELVIS W CONTRAST  Result Date: 03/02/2022 CLINICAL DATA:  acute hepatitis.  Fall. EXAM: CT ABDOMEN AND PELVIS WITH CONTRAST TECHNIQUE: Multidetector CT imaging of the abdomen and pelvis was performed using the standard protocol following bolus administration of intravenous contrast. RADIATION DOSE REDUCTION: This exam was performed according to the departmental dose-optimization program which includes automated exposure control, adjustment of the mA and/or kV according to patient size and/or use of iterative reconstruction technique. CONTRAST:  623mOMNIPAQUE IOHEXOL 350 MG/ML SOLN COMPARISON:  None Available. FINDINGS: Lower chest: Lingular scarring or atelectasis. Dependent left lower lobe airspace disease. Small left pleural effusion. Trace right pleural fluid. Mild cardiomegaly. Small hiatal hernia. Presumed pericardial cyst of 4.6 cm in the right cardiophrenic angle. Hepatobiliary: Nonspecific caudate lobe enlargement. Hepatic calcifications which are likely related to prior infection or inflammation. Probable hepatic steatosis. Subtle gallstones within the fundus. Gallbladder wall thickening and pericholecystic edema including on 31/3. No biliary duct dilatation. Pancreas: Normal pancreas for age. Spleen: Subcentimeter hypoattenuating splenic lesions are nonspecific but of doubtful clinical significance. No splenomegaly. Adrenals/Urinary Tract: Normal adrenal glands. Too small to  characterize interpolar left renal lesion. Normal right kidney for age. No hydronephrosis. Normal urinary bladder. Stomach/Bowel: Gastric body underdistention. Rectal stool ball of 8.7 cm. Normal small bowel. Vascular/Lymphatic: Advanced aortic and branch vessel atherosclerosis. No abdominopelvic adenopathy. Reproductive: Hysterectomy.  No adnexal mass. Other: No significant free fluid.  No free intraperitoneal air. Musculoskeletal: Lumbar spondylosis with S-shaped thoracolumbar spine curvature. IMPRESSION: 1. No posttraumatic deformity identified. 2. Findings highly suspicious for acute cholecystitis. 3. Rectal stool ball suggests constipation or fecal impaction. 4. Small hiatal hernia. Gastric body apparent wall thickening is at least partially felt to be due to underdistention. Correlate with symptoms of gastritis. 5. Small left and trace right pleural effusions with dependent left base airspace disease. Suspicious for mild pneumonia or aspiration with compressive atelectasis felt less likely. 6. Probable hepatic steatosis. Electronically Signed   By: KyAbigail Miyamoto.D.   On: 03/02/2022 13:36   DG Chest Port 1 View  Result Date: 03/02/2022 CLINICAL DATA:  [age]ear old female with history of altered mental status. EXAM: PORTABLE CHEST 1 VIEW COMPARISON:  Chest x-ray 05/21/2021. FINDINGS: Bibasilar (left-greater-than-right) opacities indicative of atelectasis and/or consolidation. Small left pleural effusion. No definite right pleural effusion. No pneumothorax. No evidence of pulmonary edema. Heart  size is mildly enlarged. Upper mediastinal contours are within normal limits. Atherosclerotic calcifications are noted in the thoracic aorta. IMPRESSION: 1. Bibasilar areas of atelectasis and/or consolidation. 2. Small left pleural effusion. 3. Mild cardiomegaly. 4. Aortic atherosclerosis. Electronically Signed   By: Vinnie Langton M.D.   On: 03/02/2022 11:12   US Abdomen Limited RUQ (LIVER/GB)  Result Date:  03/02/2022 CLINICAL DATA:  Right upper quadrant pain. EXAM: ULTRASOUND ABDOMEN LIMITED RIGHT UPPER QUADRANT COMPARISON:  CT abdomen and pelvis 03/02/2022 FINDINGS: Gallbladder: Gallstones are present. Multiple small layering gallstones are present measuring up to 3 mm. There is gallbladder wall thickening measuring 4 mm. Sonographic Percell Miller sign is negative. Common bile duct: Diameter: 7 mm.  (Within normal limits). Liver: There is a 10 x 8 x 8 mm hypoechoic mass in the right lobe of the liver without vascularity. Within normal limits in parenchymal echogenicity. Portal vein is patent on color Doppler imaging with normal direction of blood flow towards the liver. Other: None. IMPRESSION: 1. Cholelithiasis with gallbladder wall thickening. Negative sonographic Murphy sign. No biliary ductal dilatation. Findings may related to acute or chronic cholecystitis. Correlate clinically. 2. 1 cm hypoechoic mass in the right lobe of the liver favored as hemangioma. Electronically Signed   By: Ronney Asters M.D.   On: 03/02/2022 17:07    Microbiology: Results for orders placed or performed during the hospital encounter of 03/02/22  Blood culture (routine x 2)     Status: None (Preliminary result)   Collection Time: 03/02/22 12:39 PM   Specimen: BLOOD  Result Value Ref Range Status   Specimen Description BLOOD SITE NOT SPECIFIED  Final   Special Requests   Final    BOTTLES DRAWN AEROBIC AND ANAEROBIC Blood Culture adequate volume   Culture   Final    NO GROWTH 3 DAYS Performed at [facility], [place] [address], [place], [place] [address]    Report Status PENDING  Incomplete  Blood culture (routine x 2)     Status: None (Preliminary result)   Collection Time: 03/02/22 12:44 PM   Specimen: BLOOD  Result Value Ref Range Status   Specimen Description BLOOD SITE NOT SPECIFIED  Final   Special Requests   Final    BOTTLES DRAWN AEROBIC AND ANAEROBIC Blood Culture results may not be optimal due to an inadequate  volume of blood received in culture bottles   Culture   Final    NO GROWTH 3 DAYS Performed at [facility], [place] [address] [place],  [address]    Report Status PENDING  Incomplete    Labs: CBC: Recent Labs  Lab 03/02/22 1022 03/03/22 0047 03/04/22 0924  WBC 17.9* 10.5 6.3  NEUTROABS 16.7*  --   --   HGB 11.8* 10.3* 10.6*  HCT 35.0* 29.6* 31.5*  MCV 94.1 93.4 94.6  PLT 144* 106* 466*   Basic Metabolic Panel: Recent Labs  Lab 03/02/22 1022 03/02/22 1642 03/03/22 0047 03/04/22 0924 03/05/22 1314  NA 134* 139 139 140  --   K 3.9 4.0 3.5 3.6 3.5  CL 100 105 107 109  --   CO2 '25 25 27 23  '$ --   GLUCOSE 165* 107* 92 85  --   BUN '22 20 22 '$ 25*  --   CREATININE 1.21* 1.16* 1.10* 0.99  --   CALCIUM 8.8* 8.7* 8.4* 8.3*  --   MG  --   --   --   --  1.8   Liver Function  Tests: Recent Labs  Lab 03/02/22 1022 03/02/22 1642 03/03/22 0047 03/04/22 0924  AST 488* 314* 191* 56*  ALT 526* 426* 297* 173*  ALKPHOS 262* 248* 193* 195*  BILITOT 4.7* 4.1* 3.1* 1.3*  PROT 5.9* 5.5* 4.8* 5.0*  ALBUMIN 2.9* 2.7* 2.4* 2.3*   CBG: Recent Labs  Lab 03/02/22 1052  GLUCAP 158*    Discharge time spent: 48 minutes.   Signed: Hosie Poisson, MD [facility] 03/05/2022

## 2022-03-05 NOTE — Progress Notes (Signed)
Physical Therapy Treatment Patient Details Name: April Stevenson MRN: [id number] DOB: May 17, 1924 Today's Date: 03/05/2022   History of Present Illness Pt is a 88 y/o female who presents via EMS s/p fall at home. She also reported nausea, vomiting and abdominal pain. CT scan of the head and cervical spine did not note any acute abnormalities, but did note a chronic ununited type II dens fracture unchanged from prior imaging from 05/2021. Son reports pt sleeps in a cervical collar but does not wear it during the day. Chest x-ray noted bibasilar atelectasis/consolidation with small left-sided pleural effusion and mild cardiomegaly. CT scan of the abdomen pelvis noted findings highly suspicious for acute cholecystitis, rectal stool ball suggesting constipation or fecal impaction, small hiatal hernia. PMH significant for benign essential tremor, macular degeneration, cholestatic hepatitis, DDD, HTN, hypothyroidism, Alzheimer's Disease with dementia, LBBB, osteoporosis, pulmonary HTN.    PT Comments    Pt progressing towards physical therapy goals. Was able to perform transfers and ambulation with min-mod assist for balance support, safety, and walker management. Pt able to demonstrate improved tolerance for functional activity compared to initial evaluation, and anticipate pt will continue to improve functionally upon return home with familly/caregiver assist. Will continue to follow and progress as able per POC.     Recommendations for follow up therapy are one component of a multi-disciplinary discharge planning process, led by the attending physician.  Recommendations may be updated based on patient status, additional functional criteria and insurance authorization.  Follow Up Recommendations  Home health PT     Assistance Recommended at Discharge Intermittent Supervision/Assistance  Patient can return home with the following A little help with walking and/or transfers;A little help with  bathing/dressing/bathroom;Assistance with cooking/housework;Assist for transportation;Help with stairs or ramp for entrance   Equipment Recommendations  None recommended by PT    Recommendations for Other Services       Precautions / Restrictions Precautions Precautions: Fall Restrictions Weight Bearing Restrictions: No     Mobility  Bed Mobility Overal bed mobility: Needs Assistance Bed Mobility: Supine to Sit     Supine to sit: Mod assist     General bed mobility comments: Pt initiating transition to EOB. Assist for trunk elevation to full sitting position.    Transfers Overall transfer level: Needs assistance Equipment used: Rolling walker (2 wheels) Transfers: Sit to/from Stand Sit to Stand: Min assist, +2 safety/equipment Stand pivot transfers: Min assist, +2 safety/equipment         General transfer comment: Assist for balance support as pt powered up to full stand, as well as for walker management.    Ambulation/Gait Ambulation/Gait assistance: Min assist, +2 safety/equipment Gait Distance (Feet): 200 Feet Assistive device: Rolling walker (2 wheels) Gait Pattern/deviations: Step-through pattern, Shuffle, Trunk flexed, Narrow base of support Gait velocity: Decreased Gait velocity interpretation: <1.31 ft/sec, indicative of household ambulator   General Gait Details: Tremor noted on R hand which required assist for walker management at times. Overall pt ambulating well on RA with sats maintaining 91%. Pt not dyspneic throughout gait training.   Stairs             Wheelchair Mobility    Modified Rankin (Stroke Patients Only)       Balance Overall balance assessment: Needs assistance Sitting-balance support: Feet supported, No upper extremity supported Sitting balance-Leahy Scale: Fair     Standing balance support: Single extremity supported, No upper extremity supported Standing balance-Leahy Scale: Poor Standing balance comment: At least  1 UE support required for dynamic  activity.                            Cognition Arousal/Alertness: Awake/alert Behavior During Therapy: Impulsive Overall Cognitive Status: History of cognitive impairments - at baseline                                 General Comments: History of Alzheimer's dementia. Throughout session pt pleasant.        Exercises      General Comments        Pertinent Vitals/Pain Pain Assessment Pain Assessment: No/denies pain    Home Living                          Prior Function            PT Goals (current goals can now be found in the care plan section) Acute Rehab PT Goals Patient Stated Goal: None stated PT Goal Formulation: With patient Time For Goal Achievement: 03/10/22 Potential to Achieve Goals: Good Progress towards PT goals: Progressing toward goals    Frequency    Min 3X/week      PT Plan Current plan remains appropriate    Co-evaluation              AM-PAC PT "6 Clicks" Mobility   Outcome Measure  Help needed turning from your back to your side while in a flat bed without using bedrails?: None Help needed moving from lying on your back to sitting on the side of a flat bed without using bedrails?: A Lot Help needed moving to and from a bed to a chair (including a wheelchair)?: A Little Help needed standing up from a chair using your arms (e.g., wheelchair or bedside chair)?: A Little Help needed to walk in hospital room?: A Little Help needed climbing 3-5 steps with a railing? : A Lot 6 Click Score: 17    End of Session Equipment Utilized During Treatment: Gait belt Activity Tolerance: Patient tolerated treatment well Patient left: in chair;with call bell/phone within reach;with chair alarm set Nurse Communication: Mobility status PT Visit Diagnosis: Unsteadiness on feet (R26.81);Repeated falls (R29.6)     Time: 0370-4888 PT Time Calculation (min) (ACUTE ONLY): 24  min  Charges:  $Gait Training: 23-37 mins                     [name], PT, DPT Acute Rehabilitation Services Secure Chat Preferred Office: [phone]    [name] 03/05/2022, 2:33 PM

## 2022-03-05 NOTE — Progress Notes (Signed)
Manufacturing engineer [facility]) Hospital Liaison Note  This patient is currently enrolled in [facility] outpatient-based palliative care.   [facility] will continue to follow for discharge disposition.   Please call for any outpatient based palliative care related questions or concerns.   Thank you.   [facility] Liaison [phone]

## 2022-03-05 NOTE — Progress Notes (Addendum)
    Durable Medical Equipment  (From admission, onward)           Start     Ordered   03/05/22 1206  For home use only DME lightweight manual wheelchair with seat cushion  Once       Comments: Patient suffers from pneumonia, abdominal pain, and alzheimer's which impairs their ability to perform daily activities like bathing, dressing, and toileting in the home.  A walker will not resolve  issue with performing activities of daily living. A wheelchair will allow patient to safely perform daily activities. Patient is not able to propel themselves in the home using a standard weight wheelchair due to endurance and general weakness. Patient can self propel in the lightweight wheelchair. Length of need 6 months . Accessories: elevating leg rests (ELRs), wheel locks, extensions and anti-tippers.   03/05/22 1211   03/05/22 1122  For home use only DME lightweight manual wheelchair with seat cushion  Once       Comments: Patient suffers from significant cognitive impairment,  which impairs their ability to perform daily activities like bathing and dressing in the home.  A walker will not resolve  issue with performing activities of daily living. A wheelchair will allow patient to safely perform daily activities. Patient is not able to propel themselves in the home using a standard weight wheelchair due to general weakness. Patient can self propel in the lightweight wheelchair. Length of need Lifetime. Accessories: elevating leg rests (ELRs), wheel locks, extensions and anti-tippers.   03/05/22 1122

## 2022-03-05 NOTE — TOC Transition Note (Addendum)
Transition of Care [facility]) - CM/SW Discharge Note   Patient Details  Name: Ercelle Winkles MRN: [id number] Date of Birth: July 28, 1924  Transition of Care [facility]) CM/SW Contact:  Bartholomew Crews, RN Phone Number: [phone] 03/05/2022, 12:48 PM   Clinical Narrative:     Spoke with patient's son, [name], on his mobile phone to discuss post acute transition. [name] expressed concerns about his mom's weakness and asked about her having a wheelchair. DME order in place and referral sent to [facility] for delivery to the room. Previous RNCM arranged for [facility]. HH orders in place. Notified liaison of anticipated dc today. Patient is also followed by [facility] for palliative car- liaison for [facility] notified of discharge. Family to provide transportation home. No further TOC needs identified at this time.   Update: Notified by liaison at [facility] that family decided to hold on the wheelchair for today. Family advised by [employer] that DME prescription is good for a year and to call if they decide to get the chair.   Final next level of care: [place] Barriers to Discharge: No Barriers Identified   Patient Goals and CMS Choice Patient states their goals for this hospitalization and ongoing recovery are:: home with family CMS Medicare.gov Compare Post Acute Care list provided to:: Patient Represenative (must comment) [name]) Choice offered to / list presented to : Adult Children [name])  Discharge Placement                       Discharge Plan and Services   Discharge Planning Services: CM Consult Post Acute Care Choice: Home Health          DME Arranged: Wheelchair manual DME Agency: [facility] Date DME Agency Contacted: 03/05/22 Time DME Agency Contacted: [id number] Representative spoke with at DME Agency: [place]: RN, OT, Nurse's Aide, PT [place] Agency: [facility] Date [place]: 03/05/22 Time [place]: 1247 Representative spoke with  at [place]: [place] ([place]) Interventions     Readmission Risk Interventions     View : No data to display.

## 2022-03-07 ENCOUNTER — Telehealth: Payer: Self-pay

## 2022-03-07 DIAGNOSIS — I129 Hypertensive chronic kidney disease with stage 1 through stage 4 chronic kidney disease, or unspecified chronic kidney disease: Secondary | ICD-10-CM | POA: Diagnosis not present

## 2022-03-07 DIAGNOSIS — E785 Hyperlipidemia, unspecified: Secondary | ICD-10-CM | POA: Diagnosis not present

## 2022-03-07 DIAGNOSIS — E8809 Other disorders of plasma-protein metabolism, not elsewhere classified: Secondary | ICD-10-CM | POA: Diagnosis not present

## 2022-03-07 DIAGNOSIS — R7401 Elevation of levels of liver transaminase levels: Secondary | ICD-10-CM | POA: Diagnosis not present

## 2022-03-07 DIAGNOSIS — E43 Unspecified severe protein-calorie malnutrition: Secondary | ICD-10-CM | POA: Diagnosis not present

## 2022-03-07 DIAGNOSIS — I447 Left bundle-branch block, unspecified: Secondary | ICD-10-CM | POA: Diagnosis not present

## 2022-03-07 DIAGNOSIS — N183 Chronic kidney disease, stage 3 unspecified: Secondary | ICD-10-CM | POA: Diagnosis not present

## 2022-03-07 DIAGNOSIS — E039 Hypothyroidism, unspecified: Secondary | ICD-10-CM | POA: Diagnosis not present

## 2022-03-07 DIAGNOSIS — F0154 Vascular dementia, unspecified severity, with anxiety: Secondary | ICD-10-CM | POA: Diagnosis not present

## 2022-03-07 DIAGNOSIS — K811 Chronic cholecystitis: Secondary | ICD-10-CM | POA: Diagnosis not present

## 2022-03-07 DIAGNOSIS — G4700 Insomnia, unspecified: Secondary | ICD-10-CM | POA: Diagnosis not present

## 2022-03-07 DIAGNOSIS — H04129 Dry eye syndrome of unspecified lacrimal gland: Secondary | ICD-10-CM | POA: Diagnosis not present

## 2022-03-07 DIAGNOSIS — H3530 Unspecified macular degeneration: Secondary | ICD-10-CM | POA: Diagnosis not present

## 2022-03-07 DIAGNOSIS — K219 Gastro-esophageal reflux disease without esophagitis: Secondary | ICD-10-CM | POA: Diagnosis not present

## 2022-03-07 DIAGNOSIS — D1803 Hemangioma of intra-abdominal structures: Secondary | ICD-10-CM | POA: Diagnosis not present

## 2022-03-07 DIAGNOSIS — G250 Essential tremor: Secondary | ICD-10-CM | POA: Diagnosis not present

## 2022-03-07 DIAGNOSIS — K449 Diaphragmatic hernia without obstruction or gangrene: Secondary | ICD-10-CM | POA: Diagnosis not present

## 2022-03-07 DIAGNOSIS — E559 Vitamin D deficiency, unspecified: Secondary | ICD-10-CM | POA: Diagnosis not present

## 2022-03-07 DIAGNOSIS — Z6820 Body mass index (BMI) 20.0-20.9, adult: Secondary | ICD-10-CM | POA: Diagnosis not present

## 2022-03-07 DIAGNOSIS — F0153 Vascular dementia, unspecified severity, with mood disturbance: Secondary | ICD-10-CM | POA: Diagnosis not present

## 2022-03-07 DIAGNOSIS — D696 Thrombocytopenia, unspecified: Secondary | ICD-10-CM | POA: Diagnosis not present

## 2022-03-07 DIAGNOSIS — R627 Adult failure to thrive: Secondary | ICD-10-CM | POA: Diagnosis not present

## 2022-03-07 DIAGNOSIS — R634 Abnormal weight loss: Secondary | ICD-10-CM | POA: Diagnosis not present

## 2022-03-07 LAB — CULTURE, BLOOD (ROUTINE X 2)
Culture: NO GROWTH
Culture: NO GROWTH
Special Requests: ADEQUATE

## 2022-03-07 NOTE — Telephone Encounter (Signed)
Transition Care Management Follow-up Telephone Call Date of discharge and from where: 03/05/2022 How have you been since you were released from the hospital? better Any questions or concerns? No  Items Reviewed: Did the pt receive and understand the discharge instructions provided? Yes  Medications obtained and verified? No  Other? No  Any new allergies since your discharge? No  Dietary orders reviewed? No Do you have support at home? Yes   Home Care and Equipment/Supplies: Were home health services ordered? yes If so, what is the name of the agency? [facility] Has the agency set up a time to come to the patient's home? not applicable Were any new equipment or medical supplies ordered?  No What is the name of the medical supply agency?   Were you able to get the supplies/equipment? not applicable Do you have any questions related to the use of the equipment or supplies? No  Functional Questionnaire: (I = Independent and D = Dependent) ADLs: I  Bathing/Dressing- D  Meal Prep- D  Eating- I  Maintaining continence- D  Transferring/Ambulation- D  Managing Meds- D  Follow up appointments reviewed:  PCP Hospital f/u appt confirmed? Yes  Scheduled to see Ngetich, Nelda Bucks, NP  on 03/08/2022 @ 1020AM. [facility] f/u appt confirmed? No  Scheduled to see N/A on N/A @ N/A. Are transportation arrangements needed? No  If their condition worsens, is the pt aware to call PCP or go to the Emergency Dept.? Yes Was the patient provided with contact information for the PCP's office or ED? Yes Was to pt encouraged to call back with questions or concerns? Yes

## 2022-03-08 ENCOUNTER — Telehealth: Payer: Self-pay | Admitting: *Deleted

## 2022-03-08 ENCOUNTER — Ambulatory Visit (INDEPENDENT_AMBULATORY_CARE_PROVIDER_SITE_OTHER): Payer: Medicare Other | Admitting: Family

## 2022-03-08 ENCOUNTER — Encounter: Payer: Self-pay | Admitting: Family

## 2022-03-08 VITALS — BP 120/58 | HR 71 | Temp 98.0°F | Resp 18 | Ht 66.0 in | Wt 135.0 lb

## 2022-03-08 DIAGNOSIS — J189 Pneumonia, unspecified organism: Secondary | ICD-10-CM

## 2022-03-08 DIAGNOSIS — H6122 Impacted cerumen, left ear: Secondary | ICD-10-CM

## 2022-03-08 DIAGNOSIS — G301 Alzheimer's disease with late onset: Secondary | ICD-10-CM | POA: Diagnosis not present

## 2022-03-08 DIAGNOSIS — I700 Atherosclerosis of aorta: Secondary | ICD-10-CM

## 2022-03-08 DIAGNOSIS — F3341 Major depressive disorder, recurrent, in partial remission: Secondary | ICD-10-CM | POA: Diagnosis not present

## 2022-03-08 DIAGNOSIS — M5136 Other intervertebral disc degeneration, lumbar region: Secondary | ICD-10-CM | POA: Diagnosis not present

## 2022-03-08 DIAGNOSIS — I447 Left bundle-branch block, unspecified: Secondary | ICD-10-CM | POA: Diagnosis not present

## 2022-03-08 DIAGNOSIS — I10 Essential (primary) hypertension: Secondary | ICD-10-CM

## 2022-03-08 DIAGNOSIS — Z6820 Body mass index (BMI) 20.0-20.9, adult: Secondary | ICD-10-CM | POA: Diagnosis not present

## 2022-03-08 DIAGNOSIS — M51369 Other intervertebral disc degeneration, lumbar region without mention of lumbar back pain or lower extremity pain: Secondary | ICD-10-CM

## 2022-03-08 DIAGNOSIS — Z789 Other specified health status: Secondary | ICD-10-CM

## 2022-03-08 DIAGNOSIS — I129 Hypertensive chronic kidney disease with stage 1 through stage 4 chronic kidney disease, or unspecified chronic kidney disease: Secondary | ICD-10-CM | POA: Diagnosis not present

## 2022-03-08 DIAGNOSIS — I517 Cardiomegaly: Secondary | ICD-10-CM

## 2022-03-08 DIAGNOSIS — F02818 Dementia in other diseases classified elsewhere, unspecified severity, with other behavioral disturbance: Secondary | ICD-10-CM | POA: Diagnosis not present

## 2022-03-08 DIAGNOSIS — N183 Chronic kidney disease, stage 3 unspecified: Secondary | ICD-10-CM | POA: Diagnosis not present

## 2022-03-08 DIAGNOSIS — J90 Pleural effusion, not elsewhere classified: Secondary | ICD-10-CM

## 2022-03-08 DIAGNOSIS — E039 Hypothyroidism, unspecified: Secondary | ICD-10-CM

## 2022-03-08 DIAGNOSIS — E785 Hyperlipidemia, unspecified: Secondary | ICD-10-CM | POA: Diagnosis not present

## 2022-03-08 DIAGNOSIS — R634 Abnormal weight loss: Secondary | ICD-10-CM | POA: Diagnosis not present

## 2022-03-08 MED ORDER — DEBROX 6.5 % OT SOLN: 5.0000 [drp] | Freq: Two times a day (BID) | OTIC | 0 refills | Status: AC

## 2022-03-08 NOTE — Patient Instructions (Signed)
-   cleanse right knee and mid abdomen abrasion with saline ,pat dry, triple antibiotic ointment applied and covered with foam dressing for extra protection and absorption.change dressing every 3 days and as needed

## 2022-03-08 NOTE — Telephone Encounter (Signed)
[place] Notified and agreed.

## 2022-03-08 NOTE — Telephone Encounter (Signed)
Noted.will continue to provide care.

## 2022-03-08 NOTE — Progress Notes (Signed)
Provider: Marlowe Sax FNP-C  Sheryll Dymek, Nelda Bucks, NP  Patient Care Team: Metzli Pollick, Nelda Bucks, NP as PCP - General (Family Medicine) Jerline Pain, MD as PCP - Cardiology (Cardiology)  Extended Emergency Contact Information Primary Emergency Contact: Su Monks Address: [address]          [place], [place] [address] Johnnette Litter of [place] Work Phone: [phone] Mobile Phone: [phone] Relation: Son Secondary Emergency Contact: Flonnie Overman Address: [address]          [place],  [address] Johnnette Litter of [place] Mobile Phone: [phone] Relation: Relative  Code Status:  Full Code  Goals of care: Advanced Directive information    03/08/2022   10:46 AM  Advanced Directives  Does Patient Have a Medical Advance Directive? Yes  Type of Paramedic of [place];Living will  Does patient want to make changes to medical advance directive? No - Patient declined  Copy of [place] in Chart? No - copy requested     Chief Complaint  Patient presents with   Hospitalization Follow-up    Patient is here for hospital follow up after 3 day stay after fall at home    HPI:  Pt is a 88 y.o. female seen today for an acute visit for post hospitalization from 03/02/2022 to 03/05/2022 for fall evaluation after she was found down at home.  She complained of nausea vomiting and abdominal pain.  Had a CT scan of the abdomen done which was suspicious for acute cholecystitis and fecal impaction.  General surgery was consulted and HIDA showed patency of the cystic duct.  Her nausea and vomiting and abdominal pain resolved and she was started on regular diet.  Transaminitis and hyperbilirubinemia was noted right upper quadrant ultrasound showed gallstone with GB wall thickening but HIDA was negative for acute cholecystitis.  Her liver enzymes trended down and was negative for viral panel.  GI suggested no further work-up and to follow-up on  discharge.  Dulcolax suppository was given for constipation which was thought could be causing her abdominal pain.  Enema and bowel regimen were initiated.Also had CT scan of the head and cervical spine due to fall was negative for acute abnormalities but did show chronic ununited type II dens fracture unchanged from prior image from 06/05/2021. She had checks x-ray done 03/02/2022 which showed bibasilar atelectasis/consolidation with small left-sided pleural effusion and mild cardiomegaly. Aortic atherosclerosis also noted.  She was treated with IV antibiotics and transition to oral antibiotics on discharge.  She was evaluated by speech recommended regular diet and aspiration precaution. States feeling much better since she was discharged home.She denies any fever,chills,cough,fatigue,body aches,runny nose,chest tightness,chest pain,palpitation or shortness of breath.   Past Medical History:  Diagnosis Date   Benign essential tremor 07/18/2018   Bilateral nonexudative age-related macular degeneration 07/18/2018   Cholestatic hepatitis    2001   DDD (degenerative disc disease), lumbar 04/23/2018   Depression    Dry eyes, bilateral 07/18/2018   Fatigue    GERD (gastroesophageal reflux disease)    Hiatal hernia with GERD 07/29/2011   Hoarding behavior 04/23/2018   HTN (hypertension) 07/29/2011   Hyperlipidemia    Hypertension    Hypothyroidism 07/29/2011   Insomnia    Late onset Alzheimer's disease with behavioral disturbance ([place]) 04/23/2018   LBBB (left bundle branch block) 07/29/2011   Major depressive disorder 07/29/2016   Memory impairment 12/09/2016   Memory loss    Osteoporosis    Paranoid delusion ([place])  07/18/2018   Psychophysiological insomnia 04/23/2018   Pulmonary HTN ([place]) 03/15/2019   2 d echo 02/28/19 EF 55-60 %, mild to moderate mitral valve regurg, mild tricuspid regurg, severely elevated estimated right ventricular systolic pressure 85 mm Hg, and mild to moderate aortic valve regurg. No  diastolic CHF noted.    Pure hypercholesterolemia 07/29/2011   Sciatica    Senile osteoporosis 04/23/2018   Past Surgical History:  Procedure Laterality Date   APPENDECTOMY     CARDIAC CATHETERIZATION     FACIAL COSMETIC SURGERY     TONSILLECTOMY     TOTAL ABDOMINAL HYSTERECTOMY W/ BILATERAL SALPINGOOPHORECTOMY      Allergies  Allergen Reactions   Garlic Other (See Comments)    unknown   Lac Bovis Other (See Comments)    unknown   Lanolin Other (See Comments)    unknown   Statins Other (See Comments)    myalgia   Penicillins Rash    Outpatient Encounter Medications as of 03/08/2022  Medication Sig   calcium carbonate (OS-CAL) 600 MG TABS tablet Take 600 mg by mouth daily with breakfast.   Cholecalciferol (D3 HIGH POTENCY) 2000 units CAPS Take 1 capsule (2,000 Units total) by mouth daily.   FLUoxetine (PROZAC) 20 MG tablet TAKE 1 TABLET(20 MG) BY MOUTH DAILY   hydrALAZINE (APRESOLINE) 25 MG tablet Take 1 tablet (25 mg total) by mouth every 8 (eight) hours.   hydrOXYzine (ATARAX) 10 MG tablet Take 1 tablet (10 mg total) by mouth 3 (three) times daily as needed for anxiety.   irbesartan (AVAPRO) 300 MG tablet TAKE 1 TABLET(300 MG) BY MOUTH DAILY   levofloxacin (LEVAQUIN) 500 MG tablet Take 1 tablet (500 mg total) by mouth daily.   levothyroxine (SYNTHROID) 25 MCG tablet TAKE 1 TABLET(25 MCG) BY MOUTH AT BEDTIME   Melatonin 10 MG TABS Take 1 tablet by mouth at bedtime.   Multiple Vitamins-Minerals (PRESERVISION AREDS PO) Take 1 capsule by mouth daily.   omeprazole (PRILOSEC) 20 MG capsule TAKE 1 CAPSULE(20 MG) BY MOUTH DAILY   potassium chloride SA (KLOR-CON M) 20 MEQ tablet TAKE 1 TABLET BY MOUTH EVERY DAY   Propylene Glycol (SYSTANE COMPLETE OP) Place 1 drop into both eyes 4 (four) times daily as needed (irritation, burning).   vitamin C (ASCORBIC ACID) 500 MG tablet Take 1 tablet (500 mg total) by mouth daily.   furosemide (LASIX) 20 MG tablet Take 1 tablet (20 mg total) by  mouth daily.   traZODone (DESYREL) 100 MG tablet Take 1 tablet (100 mg total) by mouth at bedtime.   No facility-administered encounter medications on file as of 03/08/2022.    Review of Systems  Constitutional:  Negative for appetite change, chills, fatigue, fever and unexpected weight change.  HENT:  Negative for congestion, dental problem, ear discharge, ear pain, facial swelling, hearing loss, nosebleeds, postnasal drip, rhinorrhea, sinus pressure, sinus pain, sneezing, sore throat and trouble swallowing.   Eyes:  Negative for pain, discharge, redness, itching and visual disturbance.  Respiratory:  Negative for cough, chest tightness, shortness of breath and wheezing.   Cardiovascular:  Negative for chest pain, palpitations and leg swelling.  Gastrointestinal:  Negative for abdominal distention, abdominal pain, blood in stool, constipation, diarrhea, nausea and vomiting.  Endocrine: Negative for cold intolerance, heat intolerance, polydipsia, polyphagia and polyuria.  Genitourinary:  Negative for difficulty urinating, dysuria, flank pain, frequency and urgency.  Musculoskeletal:  Positive for arthralgias and gait problem. Negative for back pain, joint swelling, myalgias, neck pain and neck  stiffness.  Skin:  Negative for color change, pallor, rash and wound.  Neurological:  Negative for dizziness, syncope, speech difficulty, weakness, light-headedness, numbness and headaches.  Hematological:  Does not bruise/bleed easily.  Psychiatric/Behavioral:  Negative for agitation, behavioral problems, confusion, hallucinations, self-injury, sleep disturbance and suicidal ideas. The patient is not nervous/anxious.    Immunization History  Administered Date(s) Administered   Influenza Split 08/14/2009, 06/29/2010, 07/19/2011, 07/29/2016   Influenza, High Dose Seasonal PF 08/08/2012, 07/26/2013, 09/17/2014, 07/31/2015, 08/15/2019   Influenza, Quadrivalent, Recombinant, Inj, Pf 07/19/2017    Influenza,inj,Quad PF,6+ Mos 08/16/2018   Influenza,trivalent, recombinat, inj, PF 07/26/2016   Moderna Sars-Covid-2 Vaccination 10/22/2019, 11/19/2019   Pneumococcal Conjugate-13 08/21/2009   Pneumococcal Polysaccharide-23 04/20/2018   Td 10/13/1997   Tdap 08/31/2006   Zoster Recombinat (Shingrix) 05/25/2018, 08/19/2018   Zoster, Live 06/15/2011   Pertinent  Health Maintenance Due  Topic Date Due   INFLUENZA VACCINE  05/17/2022   DEXA SCAN  Completed      03/03/2022    8:00 PM 03/04/2022    7:31 AM 03/05/2022   12:00 AM 03/05/2022   10:10 AM 03/08/2022   10:46 AM  Fall Risk  Falls in the past year?     1  Was there an injury with Fall?     1  Fall Risk Category Calculator     3  Fall Risk Category     High  Patient Fall Risk Level _0   Patient at Risk for Falls Due to     History of fall(s);Impaired balance/gait;Impaired mobility  Fall risk Follow up     Falls evaluation completed   Functional Status Survey:    Vitals:   03/08/22 1042  BP: (!) 120/58  Pulse: 71  Resp: 18  Temp: 98 F (36.7 C)  SpO2: 96%  Weight: 135 lb (61.2 kg)  Height: _1  (1.676 m)   Body mass index is 21.79 kg/m. Physical Exam Vitals reviewed.  Constitutional:      General: She is not in acute distress.    Appearance: Normal appearance. She is normal weight. She is not ill-appearing or diaphoretic.  HENT:     Head: Normocephalic.     Right Ear: Tympanic membrane, ear canal and external ear normal. There is no impacted cerumen.     Left Ear: There is impacted cerumen.     Nose: Nose normal. No congestion or rhinorrhea.     Mouth/Throat:     Mouth: Mucous membranes are moist.     Pharynx: Oropharynx is clear. No oropharyngeal exudate or posterior oropharyngeal erythema.  Eyes:     General: No scleral icterus.       Right eye: No discharge.        Left eye: No discharge.     Extraocular Movements: Extraocular  movements intact.     Conjunctiva/sclera: Conjunctivae normal.     Pupils: Pupils are equal, round, and reactive to light.  Neck:     Vascular: No carotid bruit.  Cardiovascular:     Rate and Rhythm: Normal rate and regular rhythm.     Pulses: Normal pulses.     Heart sounds: Normal heart sounds. No murmur heard.   No friction rub. No gallop.  Pulmonary:     Effort: Pulmonary effort is normal. No respiratory distress.     Breath sounds: Normal breath sounds. No wheezing, rhonchi or rales.  Chest:     Chest  wall: No tenderness.  Abdominal:     General: Bowel sounds are normal. There is no distension.     Palpations: Abdomen is soft. There is no mass.     Tenderness: There is no abdominal tenderness. There is no right CVA tenderness, left CVA tenderness, guarding or rebound.  Musculoskeletal:        General: No swelling or tenderness. Normal range of motion.     Cervical back: Normal range of motion. No rigidity or tenderness.     Right lower leg: No edema.     Left lower leg: No edema.  Lymphadenopathy:     Cervical: No cervical adenopathy.  Skin:    General: Skin is warm and dry.     Coloration: Skin is not pale.     Findings: No bruising, erythema or rash.  Neurological:     Mental Status: She is alert and oriented to person, place, and time.     Motor: No weakness.     Coordination: Coordination normal.     Gait: Gait abnormal.  Psychiatric:        Mood and Affect: Mood normal.        Speech: Speech normal.        Behavior: Behavior normal.        Thought Content: Thought content normal.        Judgment: Judgment normal.    Labs reviewed: Recent Labs    05/23/21 0148 05/24/21 0113 03/02/22 1642 03/03/22 0047 03/04/22 0924 03/05/22 1314  NA 136   < > 139 139 140  --   K 3.6   < > 4.0 3.5 3.6 3.5  CL 98   < > 105 107 109  --   CO2 29   < > _0 --   GLUCOSE 93   < > 107* 92 85  --   BUN 21   < > 20 22 25*  --   CREATININE 1.25*   < > 1.16* 1.10* 0.99  --    CALCIUM 9.0   < > 8.7* 8.4* 8.3*  --   MG 1.7  --   --   --   --  1.8  PHOS 3.7  --   --   --   --   --    < > = values in this interval not displayed.   Recent Labs    03/02/22 1642 03/03/22 0047 03/04/22 0924  AST 314* 191* 56*  ALT 426* 297* 173*  ALKPHOS 248* 193* 195*  BILITOT 4.1* 3.1* 1.3*  PROT 5.5* 4.8* 5.0*  ALBUMIN 2.7* 2.4* 2.3*   Recent Labs    11/04/21 1009 11/15/21 1200 03/02/22 1022 03/03/22 0047 03/04/22 0924  WBC 11.3* 7.3 17.9* 10.5 6.3  NEUTROABS 8,950* 5,877 16.7*  --   --   HGB 12.8 11.8 11.8* 10.3* 10.6*  HCT 37.8 36.0 35.0* 29.6* 31.5*  MCV 92.4 93.3 94.1 93.4 94.6  PLT 168 207 144* 106* 120*   Lab Results  Component Value Date   TSH 1.218 03/02/2022   No results found for: HGBA1C Lab Results  Component Value Date   CHOL 244 (A) 04/19/2018   HDL 54 04/19/2018   LDLCALC 168 04/19/2018   TRIG 110 04/19/2018    Significant Diagnostic Results in last 30 days:  CT HEAD WO CONTRAST  Result Date: 03/02/2022 CLINICAL DATA:  Neck trauma (Age >= 65y); Head trauma, minor (Age >= 65y). History of dens fracture. EXAM: CT HEAD  WITHOUT CONTRAST CT CERVICAL SPINE WITHOUT CONTRAST TECHNIQUE: Multidetector CT imaging of the head and cervical spine was performed following the standard protocol without intravenous contrast. Multiplanar CT image reconstructions of the cervical spine were also generated. RADIATION DOSE REDUCTION: This exam was performed according to the departmental dose-optimization program which includes automated exposure control, adjustment of the mA and/or kV according to patient size and/or use of iterative reconstruction technique. COMPARISON:  05/21/2021 FINDINGS: CT HEAD FINDINGS Brain: No evidence of acute infarction, hemorrhage, hydrocephalus, extra-axial collection or mass lesion/mass effect. Scattered low-density changes within the periventricular and subcortical white matter compatible with chronic microvascular ischemic change. Mild  diffuse cerebral volume loss. Vascular: No hyperdense vessel or unexpected calcification. Skull: Normal. Negative for fracture or focal lesion. Sinuses/Orbits: No acute finding. Other: Negative for scalp hematoma. CT CERVICAL SPINE FINDINGS Alignment: Unchanged mild posterior subluxation of C1 relative to C2. Unchanged grade 1 anterolisthesis of C3 on C4. No facet dislocation. Skull base and vertebrae: Chronic ununited type 2 dens fracture. The dens remains mildly displaced posteriorly relative to the body of C2, unchanged. No new or acute fractures of the cervical spine. No focal lytic or sclerotic bone lesion. Soft tissues and spinal canal: No prevertebral fluid or swelling. No visible canal hematoma. Disc levels: Degenerative disc disease most pronounced at C5-6. Multilevel bilateral facet arthropathy, left worse than right. Progressive arthropathy at the C1-2 articulation. Upper chest: Negative. Other: None. IMPRESSION: 1. No acute intracranial abnormality. 2. No acute cervical spine fracture. 3. Chronic ununited type 2 dens fracture, unchanged from prior. 4. Chronic microvascular ischemic change and cerebral volume loss. Electronically Signed   By: Duanne Guess D.O.   On: 03/02/2022 11:42   CT CERVICAL SPINE WO CONTRAST  Result Date: 03/02/2022 CLINICAL DATA:  Neck trauma (Age >= 65y); Head trauma, minor (Age >= 65y). History of dens fracture. EXAM: CT HEAD WITHOUT CONTRAST CT CERVICAL SPINE WITHOUT CONTRAST TECHNIQUE: Multidetector CT imaging of the head and cervical spine was performed following the standard protocol without intravenous contrast. Multiplanar CT image reconstructions of the cervical spine were also generated. RADIATION DOSE REDUCTION: This exam was performed according to the departmental dose-optimization program which includes automated exposure control, adjustment of the mA and/or kV according to patient size and/or use of iterative reconstruction technique. COMPARISON:  05/21/2021  FINDINGS: CT HEAD FINDINGS Brain: No evidence of acute infarction, hemorrhage, hydrocephalus, extra-axial collection or mass lesion/mass effect. Scattered low-density changes within the periventricular and subcortical white matter compatible with chronic microvascular ischemic change. Mild diffuse cerebral volume loss. Vascular: No hyperdense vessel or unexpected calcification. Skull: Normal. Negative for fracture or focal lesion. Sinuses/Orbits: No acute finding. Other: Negative for scalp hematoma. CT CERVICAL SPINE FINDINGS Alignment: Unchanged mild posterior subluxation of C1 relative to C2. Unchanged grade 1 anterolisthesis of C3 on C4. No facet dislocation. Skull base and vertebrae: Chronic ununited type 2 dens fracture. The dens remains mildly displaced posteriorly relative to the body of C2, unchanged. No new or acute fractures of the cervical spine. No focal lytic or sclerotic bone lesion. Soft tissues and spinal canal: No prevertebral fluid or swelling. No visible canal hematoma. Disc levels: Degenerative disc disease most pronounced at C5-6. Multilevel bilateral facet arthropathy, left worse than right. Progressive arthropathy at the C1-2 articulation. Upper chest: Negative. Other: None. IMPRESSION: 1. No acute intracranial abnormality. 2. No acute cervical spine fracture. 3. Chronic ununited type 2 dens fracture, unchanged from prior. 4. Chronic microvascular ischemic change and cerebral volume loss. Electronically Signed  By: Davina Poke D.O.   On: 03/02/2022 11:42   MR BRAIN WO CONTRAST  Result Date: 03/04/2022 CLINICAL DATA:  Altered mental status EXAM: MRI HEAD WITHOUT CONTRAST TECHNIQUE: Multiplanar, multiecho pulse sequences of the brain and surrounding structures were obtained without intravenous contrast. COMPARISON:  None Available. FINDINGS: Brain: No acute infarct, mass effect or extra-axial collection. No acute or chronic hemorrhage. There is multifocal hyperintense T2-weighted  signal within the white matter. Generalized cerebral volume loss. The midline structures are normal. Vascular: Major flow voids are preserved. Skull and upper cervical spine: Normal calvarium and skull base. Visualized upper cervical spine and soft tissues are normal. Sinuses/Orbits:No paranasal sinus fluid levels or advanced mucosal thickening. No mastoid or middle ear effusion. Normal orbits. IMPRESSION: 1. No acute intracranial abnormality. 2. Chronic small vessel ischemia and generalized volume loss. Electronically Signed   By: Ulyses Jarred M.D.   On: 03/04/2022 20:42   NM Hepatobiliary Liver Func  Result Date: 03/03/2022 CLINICAL DATA:  Dilated gallbladder with wall thickening and cholelithiasis on recent ultrasound, evaluate for cystic duct patency. EXAM: NUCLEAR MEDICINE HEPATOBILIARY IMAGING TECHNIQUE: Sequential images of the abdomen were obtained out to 60 minutes following intravenous administration of radiopharmaceutical. RADIOPHARMACEUTICALS:  7.1 mCi Tc-17m Choletec IV COMPARISON:  Ultrasound from the previous day. FINDINGS: Prompt uptake and biliary excretion of activity by the liver is seen. Biliary activity passes into small bowel, consistent with patent common bile duct. Gallbladder is not visualized. 2.3 mg of morphine was then administered in an effort to shunt biliary tracer into the gallbladder. Delayed images demonstrate visualization of the gallbladder consistent with cystic duct patency. IMPRESSION: Normal uptake and excretion of biliary tracer. Initial nonvisualization of the gallbladder with subsequent visualization following morphine administration indicating cystic duct patency. Electronically Signed   By: MInez CatalinaM.D.   On: 03/03/2022 19:30   CT ABDOMEN PELVIS W CONTRAST  Result Date: 03/02/2022 CLINICAL DATA:  acute hepatitis.  Fall. EXAM: CT ABDOMEN AND PELVIS WITH CONTRAST TECHNIQUE: Multidetector CT imaging of the abdomen and pelvis was performed using the standard  protocol following bolus administration of intravenous contrast. RADIATION DOSE REDUCTION: This exam was performed according to the departmental dose-optimization program which includes automated exposure control, adjustment of the mA and/or kV according to patient size and/or use of iterative reconstruction technique. CONTRAST:  624mOMNIPAQUE IOHEXOL 350 MG/ML SOLN COMPARISON:  None Available. FINDINGS: Lower chest: Lingular scarring or atelectasis. Dependent left lower lobe airspace disease. Small left pleural effusion. Trace right pleural fluid. Mild cardiomegaly. Small hiatal hernia. Presumed pericardial cyst of 4.6 cm in the right cardiophrenic angle. Hepatobiliary: Nonspecific caudate lobe enlargement. Hepatic calcifications which are likely related to prior infection or inflammation. Probable hepatic steatosis. Subtle gallstones within the fundus. Gallbladder wall thickening and pericholecystic edema including on 31/3. No biliary duct dilatation. Pancreas: Normal pancreas for age. Spleen: Subcentimeter hypoattenuating splenic lesions are nonspecific but of doubtful clinical significance. No splenomegaly. Adrenals/Urinary Tract: Normal adrenal glands. Too small to characterize interpolar left renal lesion. Normal right kidney for age. No hydronephrosis. Normal urinary bladder. Stomach/Bowel: Gastric body underdistention. Rectal stool ball of 8.7 cm. Normal small bowel. Vascular/Lymphatic: Advanced aortic and branch vessel atherosclerosis. No abdominopelvic adenopathy. Reproductive: Hysterectomy.  No adnexal mass. Other: No significant free fluid.  No free intraperitoneal air. Musculoskeletal: Lumbar spondylosis with S-shaped thoracolumbar spine curvature. IMPRESSION: 1. No posttraumatic deformity identified. 2. Findings highly suspicious for acute cholecystitis. 3. Rectal stool ball suggests constipation or fecal impaction. 4. Small hiatal hernia. Gastric  body apparent wall thickening is at least partially  felt to be due to underdistention. Correlate with symptoms of gastritis. 5. Small left and trace right pleural effusions with dependent left base airspace disease. Suspicious for mild pneumonia or aspiration with compressive atelectasis felt less likely. 6. Probable hepatic steatosis. Electronically Signed   By: Abigail Miyamoto M.D.   On: 03/02/2022 13:36   DG CHEST PORT 1 VIEW  Result Date: 03/05/2022 CLINICAL DATA:  Hypoxia EXAM: PORTABLE CHEST 1 VIEW COMPARISON:  Previous studies including the examination of 03/02/2022 FINDINGS: Transverse diameter of heart is increased. Central pulmonary vessels are prominent. Increased density is seen in the left lower lung fields and medial right lower lung fields. Lateral CP angles are indistinct. There is no pneumothorax. IMPRESSION: Cardiomegaly. There are no signs of alveolar pulmonary edema. Increased density is seen in the both lower lung fields with interval worsening suggesting progression of atelectasis/pneumonia and possibly increase in bilateral pleural effusions. Electronically Signed   By: Elmer Picker M.D.   On: 03/05/2022 15:40   DG Chest Port 1 View  Result Date: 03/02/2022 CLINICAL DATA:  88 year old female with history of altered mental status. EXAM: PORTABLE CHEST 1 VIEW COMPARISON:  Chest x-ray 05/21/2021. FINDINGS: Bibasilar (left-greater-than-right) opacities indicative of atelectasis and/or consolidation. Small left pleural effusion. No definite right pleural effusion. No pneumothorax. No evidence of pulmonary edema. Heart size is mildly enlarged. Upper mediastinal contours are within normal limits. Atherosclerotic calcifications are noted in the thoracic aorta. IMPRESSION: 1. Bibasilar areas of atelectasis and/or consolidation. 2. Small left pleural effusion. 3. Mild cardiomegaly. 4. Aortic atherosclerosis. Electronically Signed   By: Vinnie Langton M.D.   On: 03/02/2022 11:12   US Abdomen Limited RUQ (LIVER/GB)  Result Date:  03/02/2022 CLINICAL DATA:  Right upper quadrant pain. EXAM: ULTRASOUND ABDOMEN LIMITED RIGHT UPPER QUADRANT COMPARISON:  CT abdomen and pelvis 03/02/2022 FINDINGS: Gallbladder: Gallstones are present. Multiple small layering gallstones are present measuring up to 3 mm. There is gallbladder wall thickening measuring 4 mm. Sonographic Percell Miller sign is negative. Common bile duct: Diameter: 7 mm.  (Within normal limits). Liver: There is a 10 x 8 x 8 mm hypoechoic mass in the right lobe of the liver without vascularity. Within normal limits in parenchymal echogenicity. Portal vein is patent on color Doppler imaging with normal direction of blood flow towards the liver. Other: None. IMPRESSION: 1. Cholelithiasis with gallbladder wall thickening. Negative sonographic Murphy sign. No biliary ductal dilatation. Findings may related to acute or chronic cholecystitis. Correlate clinically. 2. 1 cm hypoechoic mass in the right lobe of the liver favored as hemangioma. Electronically Signed   By: Ronney Asters M.D.   On: 03/02/2022 17:07    Assessment/Plan 1. Essential hypertension Blood pressure well controlled -Continue on hydralazine, Avapro and furosemide - CBC with Differential/Platelet - CMP with eGFR([facility])  2. Acquired hypothyroidism Lab Results  Component Value Date   TSH 1.218 03/02/2022  Continue on levothyroxine 25 mcg daily  3. Left ear impacted cerumen Tympanic membrane not visualized -Advised to instill debrox 6.5 otic solution 5 drops into each ear twice daily x 4 days then follow up for ear lavage.May apply cotton ball at bedtime to prevent drainage to pillow.  - carbamide peroxide (DEBROX) 6.5 % OTIC solution; Place 5 drops into the left ear 2 (two) times daily for 4 days.  Dispense: 2 mL; Refill: 0  4. Late onset Alzheimer's disease with behavioral disturbance ([place]) No new behavioral issues reported Continue with supportive care  5.  Recurrent major depressive disorder, in partial  remission (HCC) Mood stable Continue on Prozac  6. DDD (degenerative disc disease), lumbar Continue on over-the-counter analgesic  7. Pleural effusion on left Noted on x-ray during hospitalization.  No shortness of breath or cough reported Continue on furosemide   8. Mild cardiomegaly noted on recent checks x-ray 03/02/2022 -No signs of fluid overload -On furosemide and potassium supplement  9. Aortic atherosclerosis ([place]) Per recent checks x-ray 03/02/2022 -Not on any aspirin or anticoagulant due to advanced age and high risk for falls -Intolerance to statin -Continue on a heart healthy diet  10. Statin intolerance Has allergy to statin  11. CAP Bibasilar pneumonia noted.  Treated with IV antibiotics and discharged on Levaquin -Advised to complete antibiotics as prescribed  Family/ staff Communication: Reviewed plan of care with patient  Labs/tests ordered: None   Next Appointment: As needed if symptoms worsen or fail to improve    Sandrea Hughs, NP

## 2022-03-08 NOTE — Telephone Encounter (Signed)
Sherry with [facility] called and stated that patient has been admitted to their services and family is requesting for Dinah to be patient's Attending.   Please Advise.

## 2022-03-09 LAB — CBC WITH DIFFERENTIAL/PLATELET
Absolute Monocytes: 883 cells/uL (ref 200–950)
Basophils Absolute: 18 cells/uL (ref 0–200)
Basophils Relative: 0.2 %
Eosinophils Absolute: 118 cells/uL (ref 15–500)
Eosinophils Relative: 1.3 %
HCT: 31.9 % — ABNORMAL LOW (ref 35.0–45.0)
Hemoglobin: 10.9 g/dL — ABNORMAL LOW (ref 11.7–15.5)
Lymphs Abs: 1156 cells/uL (ref 850–3900)
MCH: 31.1 pg (ref 27.0–33.0)
MCHC: 34.2 g/dL (ref 32.0–36.0)
MCV: 90.9 fL (ref 80.0–100.0)
MPV: 9.8 fL (ref 7.5–12.5)
Monocytes Relative: 9.7 %
Neutro Abs: 6925 cells/uL (ref 1500–7800)
Neutrophils Relative %: 76.1 %
Platelets: 263 10*3/uL (ref 140–400)
RBC: 3.51 10*6/uL — ABNORMAL LOW (ref 3.80–5.10)
RDW: 12.6 % (ref 11.0–15.0)
Total Lymphocyte: 12.7 %
WBC: 9.1 10*3/uL (ref 3.8–10.8)

## 2022-03-09 LAB — COMPLETE METABOLIC PANEL WITH GFR
AG Ratio: 1.5 (calc) (ref 1.0–2.5)
ALT: 77 U/L — ABNORMAL HIGH (ref 6–29)
AST: 31 U/L (ref 10–35)
Albumin: 3 g/dL — ABNORMAL LOW (ref 3.6–5.1)
Alkaline phosphatase (APISO): 278 U/L — ABNORMAL HIGH (ref 37–153)
BUN: 14 mg/dL (ref 7–25)
CO2: 29 mmol/L (ref 20–32)
Calcium: 8.1 mg/dL — ABNORMAL LOW (ref 8.6–10.4)
Chloride: 103 mmol/L (ref 98–110)
Creat: 0.87 mg/dL (ref 0.60–0.95)
Globulin: 2 g/dL (calc) (ref 1.9–3.7)
Glucose, Bld: 105 mg/dL (ref 65–139)
Potassium: 4.6 mmol/L (ref 3.5–5.3)
Sodium: 138 mmol/L (ref 135–146)
Total Bilirubin: 0.9 mg/dL (ref 0.2–1.2)
Total Protein: 5 g/dL — ABNORMAL LOW (ref 6.1–8.1)
eGFR: 61 mL/min/{1.73_m2} (ref >=60)

## 2022-03-10 DIAGNOSIS — Z6820 Body mass index (BMI) 20.0-20.9, adult: Secondary | ICD-10-CM | POA: Diagnosis not present

## 2022-03-10 DIAGNOSIS — R634 Abnormal weight loss: Secondary | ICD-10-CM | POA: Diagnosis not present

## 2022-03-10 DIAGNOSIS — I447 Left bundle-branch block, unspecified: Secondary | ICD-10-CM | POA: Diagnosis not present

## 2022-03-10 DIAGNOSIS — E785 Hyperlipidemia, unspecified: Secondary | ICD-10-CM | POA: Diagnosis not present

## 2022-03-10 DIAGNOSIS — N183 Chronic kidney disease, stage 3 unspecified: Secondary | ICD-10-CM | POA: Diagnosis not present

## 2022-03-10 DIAGNOSIS — I129 Hypertensive chronic kidney disease with stage 1 through stage 4 chronic kidney disease, or unspecified chronic kidney disease: Secondary | ICD-10-CM | POA: Diagnosis not present

## 2022-03-13 DIAGNOSIS — I517 Cardiomegaly: Secondary | ICD-10-CM | POA: Insufficient documentation

## 2022-03-13 DIAGNOSIS — Z789 Other specified health status: Secondary | ICD-10-CM | POA: Insufficient documentation

## 2022-03-13 DIAGNOSIS — I700 Atherosclerosis of aorta: Secondary | ICD-10-CM | POA: Insufficient documentation

## 2022-03-14 DIAGNOSIS — Z6820 Body mass index (BMI) 20.0-20.9, adult: Secondary | ICD-10-CM | POA: Diagnosis not present

## 2022-03-14 DIAGNOSIS — I129 Hypertensive chronic kidney disease with stage 1 through stage 4 chronic kidney disease, or unspecified chronic kidney disease: Secondary | ICD-10-CM | POA: Diagnosis not present

## 2022-03-14 DIAGNOSIS — R634 Abnormal weight loss: Secondary | ICD-10-CM | POA: Diagnosis not present

## 2022-03-14 DIAGNOSIS — E785 Hyperlipidemia, unspecified: Secondary | ICD-10-CM | POA: Diagnosis not present

## 2022-03-14 DIAGNOSIS — N183 Chronic kidney disease, stage 3 unspecified: Secondary | ICD-10-CM | POA: Diagnosis not present

## 2022-03-14 DIAGNOSIS — I447 Left bundle-branch block, unspecified: Secondary | ICD-10-CM | POA: Diagnosis not present

## 2022-03-16 DIAGNOSIS — I129 Hypertensive chronic kidney disease with stage 1 through stage 4 chronic kidney disease, or unspecified chronic kidney disease: Secondary | ICD-10-CM | POA: Diagnosis not present

## 2022-03-16 DIAGNOSIS — R634 Abnormal weight loss: Secondary | ICD-10-CM | POA: Diagnosis not present

## 2022-03-16 DIAGNOSIS — Z6820 Body mass index (BMI) 20.0-20.9, adult: Secondary | ICD-10-CM | POA: Diagnosis not present

## 2022-03-16 DIAGNOSIS — E785 Hyperlipidemia, unspecified: Secondary | ICD-10-CM | POA: Diagnosis not present

## 2022-03-16 DIAGNOSIS — N183 Chronic kidney disease, stage 3 unspecified: Secondary | ICD-10-CM | POA: Diagnosis not present

## 2022-03-16 DIAGNOSIS — I447 Left bundle-branch block, unspecified: Secondary | ICD-10-CM | POA: Diagnosis not present

## 2022-03-17 DIAGNOSIS — H3530 Unspecified macular degeneration: Secondary | ICD-10-CM | POA: Diagnosis not present

## 2022-03-17 DIAGNOSIS — F0154 Vascular dementia, unspecified severity, with anxiety: Secondary | ICD-10-CM | POA: Diagnosis not present

## 2022-03-17 DIAGNOSIS — E8809 Other disorders of plasma-protein metabolism, not elsewhere classified: Secondary | ICD-10-CM | POA: Diagnosis not present

## 2022-03-17 DIAGNOSIS — E039 Hypothyroidism, unspecified: Secondary | ICD-10-CM | POA: Diagnosis not present

## 2022-03-17 DIAGNOSIS — I447 Left bundle-branch block, unspecified: Secondary | ICD-10-CM | POA: Diagnosis not present

## 2022-03-17 DIAGNOSIS — E785 Hyperlipidemia, unspecified: Secondary | ICD-10-CM | POA: Diagnosis not present

## 2022-03-17 DIAGNOSIS — G250 Essential tremor: Secondary | ICD-10-CM | POA: Diagnosis not present

## 2022-03-17 DIAGNOSIS — R627 Adult failure to thrive: Secondary | ICD-10-CM | POA: Diagnosis not present

## 2022-03-17 DIAGNOSIS — N183 Chronic kidney disease, stage 3 unspecified: Secondary | ICD-10-CM | POA: Diagnosis not present

## 2022-03-17 DIAGNOSIS — G4700 Insomnia, unspecified: Secondary | ICD-10-CM | POA: Diagnosis not present

## 2022-03-17 DIAGNOSIS — D696 Thrombocytopenia, unspecified: Secondary | ICD-10-CM | POA: Diagnosis not present

## 2022-03-17 DIAGNOSIS — K811 Chronic cholecystitis: Secondary | ICD-10-CM | POA: Diagnosis not present

## 2022-03-17 DIAGNOSIS — Z6820 Body mass index (BMI) 20.0-20.9, adult: Secondary | ICD-10-CM | POA: Diagnosis not present

## 2022-03-17 DIAGNOSIS — E43 Unspecified severe protein-calorie malnutrition: Secondary | ICD-10-CM | POA: Diagnosis not present

## 2022-03-17 DIAGNOSIS — K219 Gastro-esophageal reflux disease without esophagitis: Secondary | ICD-10-CM | POA: Diagnosis not present

## 2022-03-17 DIAGNOSIS — K449 Diaphragmatic hernia without obstruction or gangrene: Secondary | ICD-10-CM | POA: Diagnosis not present

## 2022-03-17 DIAGNOSIS — R7401 Elevation of levels of liver transaminase levels: Secondary | ICD-10-CM | POA: Diagnosis not present

## 2022-03-17 DIAGNOSIS — D1803 Hemangioma of intra-abdominal structures: Secondary | ICD-10-CM | POA: Diagnosis not present

## 2022-03-17 DIAGNOSIS — H04129 Dry eye syndrome of unspecified lacrimal gland: Secondary | ICD-10-CM | POA: Diagnosis not present

## 2022-03-17 DIAGNOSIS — E559 Vitamin D deficiency, unspecified: Secondary | ICD-10-CM | POA: Diagnosis not present

## 2022-03-17 DIAGNOSIS — R634 Abnormal weight loss: Secondary | ICD-10-CM | POA: Diagnosis not present

## 2022-03-17 DIAGNOSIS — F0153 Vascular dementia, unspecified severity, with mood disturbance: Secondary | ICD-10-CM | POA: Diagnosis not present

## 2022-03-17 DIAGNOSIS — I129 Hypertensive chronic kidney disease with stage 1 through stage 4 chronic kidney disease, or unspecified chronic kidney disease: Secondary | ICD-10-CM | POA: Diagnosis not present

## 2022-03-18 ENCOUNTER — Ambulatory Visit: Payer: Medicare Other | Admitting: Podiatry

## 2022-03-19 DIAGNOSIS — Z6820 Body mass index (BMI) 20.0-20.9, adult: Secondary | ICD-10-CM | POA: Diagnosis not present

## 2022-03-19 DIAGNOSIS — I447 Left bundle-branch block, unspecified: Secondary | ICD-10-CM | POA: Diagnosis not present

## 2022-03-19 DIAGNOSIS — N183 Chronic kidney disease, stage 3 unspecified: Secondary | ICD-10-CM | POA: Diagnosis not present

## 2022-03-19 DIAGNOSIS — E785 Hyperlipidemia, unspecified: Secondary | ICD-10-CM | POA: Diagnosis not present

## 2022-03-19 DIAGNOSIS — I129 Hypertensive chronic kidney disease with stage 1 through stage 4 chronic kidney disease, or unspecified chronic kidney disease: Secondary | ICD-10-CM | POA: Diagnosis not present

## 2022-03-19 DIAGNOSIS — R634 Abnormal weight loss: Secondary | ICD-10-CM | POA: Diagnosis not present

## 2022-03-21 DIAGNOSIS — E785 Hyperlipidemia, unspecified: Secondary | ICD-10-CM | POA: Diagnosis not present

## 2022-03-21 DIAGNOSIS — R634 Abnormal weight loss: Secondary | ICD-10-CM | POA: Diagnosis not present

## 2022-03-21 DIAGNOSIS — I129 Hypertensive chronic kidney disease with stage 1 through stage 4 chronic kidney disease, or unspecified chronic kidney disease: Secondary | ICD-10-CM | POA: Diagnosis not present

## 2022-03-21 DIAGNOSIS — N183 Chronic kidney disease, stage 3 unspecified: Secondary | ICD-10-CM | POA: Diagnosis not present

## 2022-03-21 DIAGNOSIS — Z6820 Body mass index (BMI) 20.0-20.9, adult: Secondary | ICD-10-CM | POA: Diagnosis not present

## 2022-03-21 DIAGNOSIS — I447 Left bundle-branch block, unspecified: Secondary | ICD-10-CM | POA: Diagnosis not present

## 2022-03-22 DIAGNOSIS — I447 Left bundle-branch block, unspecified: Secondary | ICD-10-CM | POA: Diagnosis not present

## 2022-03-22 DIAGNOSIS — Z6820 Body mass index (BMI) 20.0-20.9, adult: Secondary | ICD-10-CM | POA: Diagnosis not present

## 2022-03-22 DIAGNOSIS — I129 Hypertensive chronic kidney disease with stage 1 through stage 4 chronic kidney disease, or unspecified chronic kidney disease: Secondary | ICD-10-CM | POA: Diagnosis not present

## 2022-03-22 DIAGNOSIS — R634 Abnormal weight loss: Secondary | ICD-10-CM | POA: Diagnosis not present

## 2022-03-22 DIAGNOSIS — E785 Hyperlipidemia, unspecified: Secondary | ICD-10-CM | POA: Diagnosis not present

## 2022-03-22 DIAGNOSIS — N183 Chronic kidney disease, stage 3 unspecified: Secondary | ICD-10-CM | POA: Diagnosis not present

## 2022-03-24 DIAGNOSIS — R634 Abnormal weight loss: Secondary | ICD-10-CM | POA: Diagnosis not present

## 2022-03-24 DIAGNOSIS — I129 Hypertensive chronic kidney disease with stage 1 through stage 4 chronic kidney disease, or unspecified chronic kidney disease: Secondary | ICD-10-CM | POA: Diagnosis not present

## 2022-03-24 DIAGNOSIS — N183 Chronic kidney disease, stage 3 unspecified: Secondary | ICD-10-CM | POA: Diagnosis not present

## 2022-03-24 DIAGNOSIS — Z6820 Body mass index (BMI) 20.0-20.9, adult: Secondary | ICD-10-CM | POA: Diagnosis not present

## 2022-03-24 DIAGNOSIS — E785 Hyperlipidemia, unspecified: Secondary | ICD-10-CM | POA: Diagnosis not present

## 2022-03-24 DIAGNOSIS — I447 Left bundle-branch block, unspecified: Secondary | ICD-10-CM | POA: Diagnosis not present

## 2022-03-28 DIAGNOSIS — R634 Abnormal weight loss: Secondary | ICD-10-CM | POA: Diagnosis not present

## 2022-03-28 DIAGNOSIS — I447 Left bundle-branch block, unspecified: Secondary | ICD-10-CM | POA: Diagnosis not present

## 2022-03-28 DIAGNOSIS — N183 Chronic kidney disease, stage 3 unspecified: Secondary | ICD-10-CM | POA: Diagnosis not present

## 2022-03-28 DIAGNOSIS — Z6820 Body mass index (BMI) 20.0-20.9, adult: Secondary | ICD-10-CM | POA: Diagnosis not present

## 2022-03-28 DIAGNOSIS — E785 Hyperlipidemia, unspecified: Secondary | ICD-10-CM | POA: Diagnosis not present

## 2022-03-28 DIAGNOSIS — I129 Hypertensive chronic kidney disease with stage 1 through stage 4 chronic kidney disease, or unspecified chronic kidney disease: Secondary | ICD-10-CM | POA: Diagnosis not present

## 2022-03-31 DIAGNOSIS — Z6820 Body mass index (BMI) 20.0-20.9, adult: Secondary | ICD-10-CM | POA: Diagnosis not present

## 2022-03-31 DIAGNOSIS — E785 Hyperlipidemia, unspecified: Secondary | ICD-10-CM | POA: Diagnosis not present

## 2022-03-31 DIAGNOSIS — R634 Abnormal weight loss: Secondary | ICD-10-CM | POA: Diagnosis not present

## 2022-03-31 DIAGNOSIS — I447 Left bundle-branch block, unspecified: Secondary | ICD-10-CM | POA: Diagnosis not present

## 2022-03-31 DIAGNOSIS — N183 Chronic kidney disease, stage 3 unspecified: Secondary | ICD-10-CM | POA: Diagnosis not present

## 2022-03-31 DIAGNOSIS — I129 Hypertensive chronic kidney disease with stage 1 through stage 4 chronic kidney disease, or unspecified chronic kidney disease: Secondary | ICD-10-CM | POA: Diagnosis not present

## 2022-04-04 DIAGNOSIS — I447 Left bundle-branch block, unspecified: Secondary | ICD-10-CM | POA: Diagnosis not present

## 2022-04-04 DIAGNOSIS — N183 Chronic kidney disease, stage 3 unspecified: Secondary | ICD-10-CM | POA: Diagnosis not present

## 2022-04-04 DIAGNOSIS — Z6820 Body mass index (BMI) 20.0-20.9, adult: Secondary | ICD-10-CM | POA: Diagnosis not present

## 2022-04-04 DIAGNOSIS — I129 Hypertensive chronic kidney disease with stage 1 through stage 4 chronic kidney disease, or unspecified chronic kidney disease: Secondary | ICD-10-CM | POA: Diagnosis not present

## 2022-04-04 DIAGNOSIS — R634 Abnormal weight loss: Secondary | ICD-10-CM | POA: Diagnosis not present

## 2022-04-04 DIAGNOSIS — E785 Hyperlipidemia, unspecified: Secondary | ICD-10-CM | POA: Diagnosis not present

## 2022-04-05 ENCOUNTER — Ambulatory Visit: Payer: Medicare Other | Admitting: Podiatry

## 2022-04-05 DIAGNOSIS — Z6820 Body mass index (BMI) 20.0-20.9, adult: Secondary | ICD-10-CM | POA: Diagnosis not present

## 2022-04-05 DIAGNOSIS — R634 Abnormal weight loss: Secondary | ICD-10-CM | POA: Diagnosis not present

## 2022-04-05 DIAGNOSIS — N183 Chronic kidney disease, stage 3 unspecified: Secondary | ICD-10-CM | POA: Diagnosis not present

## 2022-04-05 DIAGNOSIS — I129 Hypertensive chronic kidney disease with stage 1 through stage 4 chronic kidney disease, or unspecified chronic kidney disease: Secondary | ICD-10-CM | POA: Diagnosis not present

## 2022-04-05 DIAGNOSIS — I447 Left bundle-branch block, unspecified: Secondary | ICD-10-CM | POA: Diagnosis not present

## 2022-04-05 DIAGNOSIS — E785 Hyperlipidemia, unspecified: Secondary | ICD-10-CM | POA: Diagnosis not present

## 2022-04-07 DIAGNOSIS — Z6820 Body mass index (BMI) 20.0-20.9, adult: Secondary | ICD-10-CM | POA: Diagnosis not present

## 2022-04-07 DIAGNOSIS — R634 Abnormal weight loss: Secondary | ICD-10-CM | POA: Diagnosis not present

## 2022-04-07 DIAGNOSIS — N183 Chronic kidney disease, stage 3 unspecified: Secondary | ICD-10-CM | POA: Diagnosis not present

## 2022-04-07 DIAGNOSIS — I447 Left bundle-branch block, unspecified: Secondary | ICD-10-CM | POA: Diagnosis not present

## 2022-04-07 DIAGNOSIS — I129 Hypertensive chronic kidney disease with stage 1 through stage 4 chronic kidney disease, or unspecified chronic kidney disease: Secondary | ICD-10-CM | POA: Diagnosis not present

## 2022-04-07 DIAGNOSIS — E785 Hyperlipidemia, unspecified: Secondary | ICD-10-CM | POA: Diagnosis not present

## 2022-04-11 DIAGNOSIS — N183 Chronic kidney disease, stage 3 unspecified: Secondary | ICD-10-CM | POA: Diagnosis not present

## 2022-04-11 DIAGNOSIS — I129 Hypertensive chronic kidney disease with stage 1 through stage 4 chronic kidney disease, or unspecified chronic kidney disease: Secondary | ICD-10-CM | POA: Diagnosis not present

## 2022-04-11 DIAGNOSIS — I447 Left bundle-branch block, unspecified: Secondary | ICD-10-CM | POA: Diagnosis not present

## 2022-04-11 DIAGNOSIS — E785 Hyperlipidemia, unspecified: Secondary | ICD-10-CM | POA: Diagnosis not present

## 2022-04-11 DIAGNOSIS — R634 Abnormal weight loss: Secondary | ICD-10-CM | POA: Diagnosis not present

## 2022-04-11 DIAGNOSIS — Z6820 Body mass index (BMI) 20.0-20.9, adult: Secondary | ICD-10-CM | POA: Diagnosis not present

## 2022-04-12 DIAGNOSIS — R634 Abnormal weight loss: Secondary | ICD-10-CM | POA: Diagnosis not present

## 2022-04-12 DIAGNOSIS — I129 Hypertensive chronic kidney disease with stage 1 through stage 4 chronic kidney disease, or unspecified chronic kidney disease: Secondary | ICD-10-CM | POA: Diagnosis not present

## 2022-04-12 DIAGNOSIS — Z6820 Body mass index (BMI) 20.0-20.9, adult: Secondary | ICD-10-CM | POA: Diagnosis not present

## 2022-04-12 DIAGNOSIS — N183 Chronic kidney disease, stage 3 unspecified: Secondary | ICD-10-CM | POA: Diagnosis not present

## 2022-04-12 DIAGNOSIS — I447 Left bundle-branch block, unspecified: Secondary | ICD-10-CM | POA: Diagnosis not present

## 2022-04-12 DIAGNOSIS — E785 Hyperlipidemia, unspecified: Secondary | ICD-10-CM | POA: Diagnosis not present

## 2022-04-13 DIAGNOSIS — N183 Chronic kidney disease, stage 3 unspecified: Secondary | ICD-10-CM | POA: Diagnosis not present

## 2022-04-13 DIAGNOSIS — E785 Hyperlipidemia, unspecified: Secondary | ICD-10-CM | POA: Diagnosis not present

## 2022-04-13 DIAGNOSIS — I447 Left bundle-branch block, unspecified: Secondary | ICD-10-CM | POA: Diagnosis not present

## 2022-04-13 DIAGNOSIS — R634 Abnormal weight loss: Secondary | ICD-10-CM | POA: Diagnosis not present

## 2022-04-13 DIAGNOSIS — Z6820 Body mass index (BMI) 20.0-20.9, adult: Secondary | ICD-10-CM | POA: Diagnosis not present

## 2022-04-13 DIAGNOSIS — I129 Hypertensive chronic kidney disease with stage 1 through stage 4 chronic kidney disease, or unspecified chronic kidney disease: Secondary | ICD-10-CM | POA: Diagnosis not present

## 2022-04-14 DIAGNOSIS — N183 Chronic kidney disease, stage 3 unspecified: Secondary | ICD-10-CM | POA: Diagnosis not present

## 2022-04-14 DIAGNOSIS — E785 Hyperlipidemia, unspecified: Secondary | ICD-10-CM | POA: Diagnosis not present

## 2022-04-14 DIAGNOSIS — R634 Abnormal weight loss: Secondary | ICD-10-CM | POA: Diagnosis not present

## 2022-04-14 DIAGNOSIS — I129 Hypertensive chronic kidney disease with stage 1 through stage 4 chronic kidney disease, or unspecified chronic kidney disease: Secondary | ICD-10-CM | POA: Diagnosis not present

## 2022-04-14 DIAGNOSIS — Z6820 Body mass index (BMI) 20.0-20.9, adult: Secondary | ICD-10-CM | POA: Diagnosis not present

## 2022-04-14 DIAGNOSIS — I447 Left bundle-branch block, unspecified: Secondary | ICD-10-CM | POA: Diagnosis not present

## 2022-04-15 DIAGNOSIS — E785 Hyperlipidemia, unspecified: Secondary | ICD-10-CM | POA: Diagnosis not present

## 2022-04-15 DIAGNOSIS — I129 Hypertensive chronic kidney disease with stage 1 through stage 4 chronic kidney disease, or unspecified chronic kidney disease: Secondary | ICD-10-CM | POA: Diagnosis not present

## 2022-04-15 DIAGNOSIS — Z6820 Body mass index (BMI) 20.0-20.9, adult: Secondary | ICD-10-CM | POA: Diagnosis not present

## 2022-04-15 DIAGNOSIS — I447 Left bundle-branch block, unspecified: Secondary | ICD-10-CM | POA: Diagnosis not present

## 2022-04-15 DIAGNOSIS — N183 Chronic kidney disease, stage 3 unspecified: Secondary | ICD-10-CM | POA: Diagnosis not present

## 2022-04-15 DIAGNOSIS — R634 Abnormal weight loss: Secondary | ICD-10-CM | POA: Diagnosis not present

## 2022-04-16 DIAGNOSIS — K811 Chronic cholecystitis: Secondary | ICD-10-CM | POA: Diagnosis not present

## 2022-04-16 DIAGNOSIS — H04129 Dry eye syndrome of unspecified lacrimal gland: Secondary | ICD-10-CM | POA: Diagnosis not present

## 2022-04-16 DIAGNOSIS — R7401 Elevation of levels of liver transaminase levels: Secondary | ICD-10-CM | POA: Diagnosis not present

## 2022-04-16 DIAGNOSIS — I447 Left bundle-branch block, unspecified: Secondary | ICD-10-CM | POA: Diagnosis not present

## 2022-04-16 DIAGNOSIS — G250 Essential tremor: Secondary | ICD-10-CM | POA: Diagnosis not present

## 2022-04-16 DIAGNOSIS — H3530 Unspecified macular degeneration: Secondary | ICD-10-CM | POA: Diagnosis not present

## 2022-04-16 DIAGNOSIS — E785 Hyperlipidemia, unspecified: Secondary | ICD-10-CM | POA: Diagnosis not present

## 2022-04-16 DIAGNOSIS — I129 Hypertensive chronic kidney disease with stage 1 through stage 4 chronic kidney disease, or unspecified chronic kidney disease: Secondary | ICD-10-CM | POA: Diagnosis not present

## 2022-04-16 DIAGNOSIS — D696 Thrombocytopenia, unspecified: Secondary | ICD-10-CM | POA: Diagnosis not present

## 2022-04-16 DIAGNOSIS — R634 Abnormal weight loss: Secondary | ICD-10-CM | POA: Diagnosis not present

## 2022-04-16 DIAGNOSIS — N183 Chronic kidney disease, stage 3 unspecified: Secondary | ICD-10-CM | POA: Diagnosis not present

## 2022-04-16 DIAGNOSIS — K219 Gastro-esophageal reflux disease without esophagitis: Secondary | ICD-10-CM | POA: Diagnosis not present

## 2022-04-16 DIAGNOSIS — R627 Adult failure to thrive: Secondary | ICD-10-CM | POA: Diagnosis not present

## 2022-04-16 DIAGNOSIS — E559 Vitamin D deficiency, unspecified: Secondary | ICD-10-CM | POA: Diagnosis not present

## 2022-04-16 DIAGNOSIS — F0153 Vascular dementia, unspecified severity, with mood disturbance: Secondary | ICD-10-CM | POA: Diagnosis not present

## 2022-04-16 DIAGNOSIS — K449 Diaphragmatic hernia without obstruction or gangrene: Secondary | ICD-10-CM | POA: Diagnosis not present

## 2022-04-16 DIAGNOSIS — G4700 Insomnia, unspecified: Secondary | ICD-10-CM | POA: Diagnosis not present

## 2022-04-16 DIAGNOSIS — F0154 Vascular dementia, unspecified severity, with anxiety: Secondary | ICD-10-CM | POA: Diagnosis not present

## 2022-04-16 DIAGNOSIS — Z6820 Body mass index (BMI) 20.0-20.9, adult: Secondary | ICD-10-CM | POA: Diagnosis not present

## 2022-04-16 DIAGNOSIS — E039 Hypothyroidism, unspecified: Secondary | ICD-10-CM | POA: Diagnosis not present

## 2022-04-16 DIAGNOSIS — E8809 Other disorders of plasma-protein metabolism, not elsewhere classified: Secondary | ICD-10-CM | POA: Diagnosis not present

## 2022-04-16 DIAGNOSIS — E43 Unspecified severe protein-calorie malnutrition: Secondary | ICD-10-CM | POA: Diagnosis not present

## 2022-04-16 DIAGNOSIS — D1803 Hemangioma of intra-abdominal structures: Secondary | ICD-10-CM | POA: Diagnosis not present

## 2022-04-17 DIAGNOSIS — R634 Abnormal weight loss: Secondary | ICD-10-CM | POA: Diagnosis not present

## 2022-04-17 DIAGNOSIS — I447 Left bundle-branch block, unspecified: Secondary | ICD-10-CM | POA: Diagnosis not present

## 2022-04-17 DIAGNOSIS — I129 Hypertensive chronic kidney disease with stage 1 through stage 4 chronic kidney disease, or unspecified chronic kidney disease: Secondary | ICD-10-CM | POA: Diagnosis not present

## 2022-04-17 DIAGNOSIS — E785 Hyperlipidemia, unspecified: Secondary | ICD-10-CM | POA: Diagnosis not present

## 2022-04-17 DIAGNOSIS — N183 Chronic kidney disease, stage 3 unspecified: Secondary | ICD-10-CM | POA: Diagnosis not present

## 2022-04-17 DIAGNOSIS — Z6820 Body mass index (BMI) 20.0-20.9, adult: Secondary | ICD-10-CM | POA: Diagnosis not present

## 2022-04-18 DIAGNOSIS — Z6820 Body mass index (BMI) 20.0-20.9, adult: Secondary | ICD-10-CM | POA: Diagnosis not present

## 2022-04-18 DIAGNOSIS — E785 Hyperlipidemia, unspecified: Secondary | ICD-10-CM | POA: Diagnosis not present

## 2022-04-18 DIAGNOSIS — I129 Hypertensive chronic kidney disease with stage 1 through stage 4 chronic kidney disease, or unspecified chronic kidney disease: Secondary | ICD-10-CM | POA: Diagnosis not present

## 2022-04-18 DIAGNOSIS — N183 Chronic kidney disease, stage 3 unspecified: Secondary | ICD-10-CM | POA: Diagnosis not present

## 2022-04-18 DIAGNOSIS — R634 Abnormal weight loss: Secondary | ICD-10-CM | POA: Diagnosis not present

## 2022-04-18 DIAGNOSIS — I447 Left bundle-branch block, unspecified: Secondary | ICD-10-CM | POA: Diagnosis not present

## 2022-04-21 DIAGNOSIS — R634 Abnormal weight loss: Secondary | ICD-10-CM | POA: Diagnosis not present

## 2022-04-21 DIAGNOSIS — I447 Left bundle-branch block, unspecified: Secondary | ICD-10-CM | POA: Diagnosis not present

## 2022-04-21 DIAGNOSIS — I129 Hypertensive chronic kidney disease with stage 1 through stage 4 chronic kidney disease, or unspecified chronic kidney disease: Secondary | ICD-10-CM | POA: Diagnosis not present

## 2022-04-21 DIAGNOSIS — N183 Chronic kidney disease, stage 3 unspecified: Secondary | ICD-10-CM | POA: Diagnosis not present

## 2022-04-21 DIAGNOSIS — E785 Hyperlipidemia, unspecified: Secondary | ICD-10-CM | POA: Diagnosis not present

## 2022-04-21 DIAGNOSIS — Z6820 Body mass index (BMI) 20.0-20.9, adult: Secondary | ICD-10-CM | POA: Diagnosis not present

## 2022-04-25 DIAGNOSIS — I447 Left bundle-branch block, unspecified: Secondary | ICD-10-CM | POA: Diagnosis not present

## 2022-04-25 DIAGNOSIS — E785 Hyperlipidemia, unspecified: Secondary | ICD-10-CM | POA: Diagnosis not present

## 2022-04-25 DIAGNOSIS — R634 Abnormal weight loss: Secondary | ICD-10-CM | POA: Diagnosis not present

## 2022-04-25 DIAGNOSIS — I129 Hypertensive chronic kidney disease with stage 1 through stage 4 chronic kidney disease, or unspecified chronic kidney disease: Secondary | ICD-10-CM | POA: Diagnosis not present

## 2022-04-25 DIAGNOSIS — Z6820 Body mass index (BMI) 20.0-20.9, adult: Secondary | ICD-10-CM | POA: Diagnosis not present

## 2022-04-25 DIAGNOSIS — N183 Chronic kidney disease, stage 3 unspecified: Secondary | ICD-10-CM | POA: Diagnosis not present

## 2022-04-28 DIAGNOSIS — I129 Hypertensive chronic kidney disease with stage 1 through stage 4 chronic kidney disease, or unspecified chronic kidney disease: Secondary | ICD-10-CM | POA: Diagnosis not present

## 2022-04-28 DIAGNOSIS — E785 Hyperlipidemia, unspecified: Secondary | ICD-10-CM | POA: Diagnosis not present

## 2022-04-28 DIAGNOSIS — Z6820 Body mass index (BMI) 20.0-20.9, adult: Secondary | ICD-10-CM | POA: Diagnosis not present

## 2022-04-28 DIAGNOSIS — N183 Chronic kidney disease, stage 3 unspecified: Secondary | ICD-10-CM | POA: Diagnosis not present

## 2022-04-28 DIAGNOSIS — R634 Abnormal weight loss: Secondary | ICD-10-CM | POA: Diagnosis not present

## 2022-04-28 DIAGNOSIS — I447 Left bundle-branch block, unspecified: Secondary | ICD-10-CM | POA: Diagnosis not present

## 2022-05-02 DIAGNOSIS — I129 Hypertensive chronic kidney disease with stage 1 through stage 4 chronic kidney disease, or unspecified chronic kidney disease: Secondary | ICD-10-CM | POA: Diagnosis not present

## 2022-05-02 DIAGNOSIS — Z6820 Body mass index (BMI) 20.0-20.9, adult: Secondary | ICD-10-CM | POA: Diagnosis not present

## 2022-05-02 DIAGNOSIS — E785 Hyperlipidemia, unspecified: Secondary | ICD-10-CM | POA: Diagnosis not present

## 2022-05-02 DIAGNOSIS — I447 Left bundle-branch block, unspecified: Secondary | ICD-10-CM | POA: Diagnosis not present

## 2022-05-02 DIAGNOSIS — R634 Abnormal weight loss: Secondary | ICD-10-CM | POA: Diagnosis not present

## 2022-05-02 DIAGNOSIS — N183 Chronic kidney disease, stage 3 unspecified: Secondary | ICD-10-CM | POA: Diagnosis not present

## 2022-05-03 DIAGNOSIS — E785 Hyperlipidemia, unspecified: Secondary | ICD-10-CM | POA: Diagnosis not present

## 2022-05-03 DIAGNOSIS — Z6820 Body mass index (BMI) 20.0-20.9, adult: Secondary | ICD-10-CM | POA: Diagnosis not present

## 2022-05-03 DIAGNOSIS — I447 Left bundle-branch block, unspecified: Secondary | ICD-10-CM | POA: Diagnosis not present

## 2022-05-03 DIAGNOSIS — N183 Chronic kidney disease, stage 3 unspecified: Secondary | ICD-10-CM | POA: Diagnosis not present

## 2022-05-03 DIAGNOSIS — R634 Abnormal weight loss: Secondary | ICD-10-CM | POA: Diagnosis not present

## 2022-05-03 DIAGNOSIS — I129 Hypertensive chronic kidney disease with stage 1 through stage 4 chronic kidney disease, or unspecified chronic kidney disease: Secondary | ICD-10-CM | POA: Diagnosis not present

## 2022-05-04 ENCOUNTER — Telehealth: Payer: Self-pay | Admitting: *Deleted

## 2022-05-04 NOTE — Telephone Encounter (Signed)
April Stevenson with [facility] called requesting verbal orders to continue Hospice Care.   Verbal orders given.

## 2022-05-05 DIAGNOSIS — I447 Left bundle-branch block, unspecified: Secondary | ICD-10-CM | POA: Diagnosis not present

## 2022-05-05 DIAGNOSIS — E785 Hyperlipidemia, unspecified: Secondary | ICD-10-CM | POA: Diagnosis not present

## 2022-05-05 DIAGNOSIS — I129 Hypertensive chronic kidney disease with stage 1 through stage 4 chronic kidney disease, or unspecified chronic kidney disease: Secondary | ICD-10-CM | POA: Diagnosis not present

## 2022-05-05 DIAGNOSIS — N183 Chronic kidney disease, stage 3 unspecified: Secondary | ICD-10-CM | POA: Diagnosis not present

## 2022-05-05 DIAGNOSIS — R634 Abnormal weight loss: Secondary | ICD-10-CM | POA: Diagnosis not present

## 2022-05-05 DIAGNOSIS — Z6820 Body mass index (BMI) 20.0-20.9, adult: Secondary | ICD-10-CM | POA: Diagnosis not present

## 2022-05-09 ENCOUNTER — Ambulatory Visit: Payer: Medicare Other | Admitting: Family

## 2022-05-09 DIAGNOSIS — Z6820 Body mass index (BMI) 20.0-20.9, adult: Secondary | ICD-10-CM | POA: Diagnosis not present

## 2022-05-09 DIAGNOSIS — R634 Abnormal weight loss: Secondary | ICD-10-CM | POA: Diagnosis not present

## 2022-05-09 DIAGNOSIS — I129 Hypertensive chronic kidney disease with stage 1 through stage 4 chronic kidney disease, or unspecified chronic kidney disease: Secondary | ICD-10-CM | POA: Diagnosis not present

## 2022-05-09 DIAGNOSIS — N183 Chronic kidney disease, stage 3 unspecified: Secondary | ICD-10-CM | POA: Diagnosis not present

## 2022-05-09 DIAGNOSIS — E785 Hyperlipidemia, unspecified: Secondary | ICD-10-CM | POA: Diagnosis not present

## 2022-05-09 DIAGNOSIS — I447 Left bundle-branch block, unspecified: Secondary | ICD-10-CM | POA: Diagnosis not present

## 2022-05-12 DIAGNOSIS — N183 Chronic kidney disease, stage 3 unspecified: Secondary | ICD-10-CM | POA: Diagnosis not present

## 2022-05-12 DIAGNOSIS — R634 Abnormal weight loss: Secondary | ICD-10-CM | POA: Diagnosis not present

## 2022-05-12 DIAGNOSIS — I129 Hypertensive chronic kidney disease with stage 1 through stage 4 chronic kidney disease, or unspecified chronic kidney disease: Secondary | ICD-10-CM | POA: Diagnosis not present

## 2022-05-12 DIAGNOSIS — Z6820 Body mass index (BMI) 20.0-20.9, adult: Secondary | ICD-10-CM | POA: Diagnosis not present

## 2022-05-12 DIAGNOSIS — I447 Left bundle-branch block, unspecified: Secondary | ICD-10-CM | POA: Diagnosis not present

## 2022-05-12 DIAGNOSIS — E785 Hyperlipidemia, unspecified: Secondary | ICD-10-CM | POA: Diagnosis not present

## 2022-05-13 DIAGNOSIS — I129 Hypertensive chronic kidney disease with stage 1 through stage 4 chronic kidney disease, or unspecified chronic kidney disease: Secondary | ICD-10-CM | POA: Diagnosis not present

## 2022-05-13 DIAGNOSIS — N183 Chronic kidney disease, stage 3 unspecified: Secondary | ICD-10-CM | POA: Diagnosis not present

## 2022-05-13 DIAGNOSIS — R634 Abnormal weight loss: Secondary | ICD-10-CM | POA: Diagnosis not present

## 2022-05-13 DIAGNOSIS — E785 Hyperlipidemia, unspecified: Secondary | ICD-10-CM | POA: Diagnosis not present

## 2022-05-13 DIAGNOSIS — I447 Left bundle-branch block, unspecified: Secondary | ICD-10-CM | POA: Diagnosis not present

## 2022-05-13 DIAGNOSIS — Z6820 Body mass index (BMI) 20.0-20.9, adult: Secondary | ICD-10-CM | POA: Diagnosis not present

## 2022-05-16 DIAGNOSIS — Z6820 Body mass index (BMI) 20.0-20.9, adult: Secondary | ICD-10-CM | POA: Diagnosis not present

## 2022-05-16 DIAGNOSIS — I447 Left bundle-branch block, unspecified: Secondary | ICD-10-CM | POA: Diagnosis not present

## 2022-05-16 DIAGNOSIS — E785 Hyperlipidemia, unspecified: Secondary | ICD-10-CM | POA: Diagnosis not present

## 2022-05-16 DIAGNOSIS — N183 Chronic kidney disease, stage 3 unspecified: Secondary | ICD-10-CM | POA: Diagnosis not present

## 2022-05-16 DIAGNOSIS — R634 Abnormal weight loss: Secondary | ICD-10-CM | POA: Diagnosis not present

## 2022-05-16 DIAGNOSIS — I129 Hypertensive chronic kidney disease with stage 1 through stage 4 chronic kidney disease, or unspecified chronic kidney disease: Secondary | ICD-10-CM | POA: Diagnosis not present

## 2022-05-17 DIAGNOSIS — G250 Essential tremor: Secondary | ICD-10-CM | POA: Diagnosis not present

## 2022-05-17 DIAGNOSIS — K811 Chronic cholecystitis: Secondary | ICD-10-CM | POA: Diagnosis not present

## 2022-05-17 DIAGNOSIS — G4700 Insomnia, unspecified: Secondary | ICD-10-CM | POA: Diagnosis not present

## 2022-05-17 DIAGNOSIS — I129 Hypertensive chronic kidney disease with stage 1 through stage 4 chronic kidney disease, or unspecified chronic kidney disease: Secondary | ICD-10-CM | POA: Diagnosis not present

## 2022-05-17 DIAGNOSIS — E785 Hyperlipidemia, unspecified: Secondary | ICD-10-CM | POA: Diagnosis not present

## 2022-05-17 DIAGNOSIS — E8809 Other disorders of plasma-protein metabolism, not elsewhere classified: Secondary | ICD-10-CM | POA: Diagnosis not present

## 2022-05-17 DIAGNOSIS — Z6820 Body mass index (BMI) 20.0-20.9, adult: Secondary | ICD-10-CM | POA: Diagnosis not present

## 2022-05-17 DIAGNOSIS — R7401 Elevation of levels of liver transaminase levels: Secondary | ICD-10-CM | POA: Diagnosis not present

## 2022-05-17 DIAGNOSIS — R627 Adult failure to thrive: Secondary | ICD-10-CM | POA: Diagnosis not present

## 2022-05-17 DIAGNOSIS — E559 Vitamin D deficiency, unspecified: Secondary | ICD-10-CM | POA: Diagnosis not present

## 2022-05-17 DIAGNOSIS — I447 Left bundle-branch block, unspecified: Secondary | ICD-10-CM | POA: Diagnosis not present

## 2022-05-17 DIAGNOSIS — K219 Gastro-esophageal reflux disease without esophagitis: Secondary | ICD-10-CM | POA: Diagnosis not present

## 2022-05-17 DIAGNOSIS — N183 Chronic kidney disease, stage 3 unspecified: Secondary | ICD-10-CM | POA: Diagnosis not present

## 2022-05-17 DIAGNOSIS — E039 Hypothyroidism, unspecified: Secondary | ICD-10-CM | POA: Diagnosis not present

## 2022-05-17 DIAGNOSIS — H04129 Dry eye syndrome of unspecified lacrimal gland: Secondary | ICD-10-CM | POA: Diagnosis not present

## 2022-05-17 DIAGNOSIS — F0154 Vascular dementia, unspecified severity, with anxiety: Secondary | ICD-10-CM | POA: Diagnosis not present

## 2022-05-17 DIAGNOSIS — D1803 Hemangioma of intra-abdominal structures: Secondary | ICD-10-CM | POA: Diagnosis not present

## 2022-05-17 DIAGNOSIS — D696 Thrombocytopenia, unspecified: Secondary | ICD-10-CM | POA: Diagnosis not present

## 2022-05-17 DIAGNOSIS — R634 Abnormal weight loss: Secondary | ICD-10-CM | POA: Diagnosis not present

## 2022-05-17 DIAGNOSIS — K449 Diaphragmatic hernia without obstruction or gangrene: Secondary | ICD-10-CM | POA: Diagnosis not present

## 2022-05-17 DIAGNOSIS — F0153 Vascular dementia, unspecified severity, with mood disturbance: Secondary | ICD-10-CM | POA: Diagnosis not present

## 2022-05-17 DIAGNOSIS — E43 Unspecified severe protein-calorie malnutrition: Secondary | ICD-10-CM | POA: Diagnosis not present

## 2022-05-17 DIAGNOSIS — H3530 Unspecified macular degeneration: Secondary | ICD-10-CM | POA: Diagnosis not present

## 2022-05-19 DIAGNOSIS — I129 Hypertensive chronic kidney disease with stage 1 through stage 4 chronic kidney disease, or unspecified chronic kidney disease: Secondary | ICD-10-CM | POA: Diagnosis not present

## 2022-05-19 DIAGNOSIS — E785 Hyperlipidemia, unspecified: Secondary | ICD-10-CM | POA: Diagnosis not present

## 2022-05-19 DIAGNOSIS — Z6820 Body mass index (BMI) 20.0-20.9, adult: Secondary | ICD-10-CM | POA: Diagnosis not present

## 2022-05-19 DIAGNOSIS — I447 Left bundle-branch block, unspecified: Secondary | ICD-10-CM | POA: Diagnosis not present

## 2022-05-19 DIAGNOSIS — N183 Chronic kidney disease, stage 3 unspecified: Secondary | ICD-10-CM | POA: Diagnosis not present

## 2022-05-19 DIAGNOSIS — R634 Abnormal weight loss: Secondary | ICD-10-CM | POA: Diagnosis not present

## 2022-05-23 DIAGNOSIS — R634 Abnormal weight loss: Secondary | ICD-10-CM | POA: Diagnosis not present

## 2022-05-23 DIAGNOSIS — N183 Chronic kidney disease, stage 3 unspecified: Secondary | ICD-10-CM | POA: Diagnosis not present

## 2022-05-23 DIAGNOSIS — I129 Hypertensive chronic kidney disease with stage 1 through stage 4 chronic kidney disease, or unspecified chronic kidney disease: Secondary | ICD-10-CM | POA: Diagnosis not present

## 2022-05-23 DIAGNOSIS — I447 Left bundle-branch block, unspecified: Secondary | ICD-10-CM | POA: Diagnosis not present

## 2022-05-23 DIAGNOSIS — E785 Hyperlipidemia, unspecified: Secondary | ICD-10-CM | POA: Diagnosis not present

## 2022-05-23 DIAGNOSIS — Z6820 Body mass index (BMI) 20.0-20.9, adult: Secondary | ICD-10-CM | POA: Diagnosis not present

## 2022-05-26 DIAGNOSIS — E785 Hyperlipidemia, unspecified: Secondary | ICD-10-CM | POA: Diagnosis not present

## 2022-05-26 DIAGNOSIS — N183 Chronic kidney disease, stage 3 unspecified: Secondary | ICD-10-CM | POA: Diagnosis not present

## 2022-05-26 DIAGNOSIS — R634 Abnormal weight loss: Secondary | ICD-10-CM | POA: Diagnosis not present

## 2022-05-26 DIAGNOSIS — Z6820 Body mass index (BMI) 20.0-20.9, adult: Secondary | ICD-10-CM | POA: Diagnosis not present

## 2022-05-26 DIAGNOSIS — I447 Left bundle-branch block, unspecified: Secondary | ICD-10-CM | POA: Diagnosis not present

## 2022-05-26 DIAGNOSIS — I129 Hypertensive chronic kidney disease with stage 1 through stage 4 chronic kidney disease, or unspecified chronic kidney disease: Secondary | ICD-10-CM | POA: Diagnosis not present

## 2022-05-30 DIAGNOSIS — E785 Hyperlipidemia, unspecified: Secondary | ICD-10-CM | POA: Diagnosis not present

## 2022-05-30 DIAGNOSIS — Z6820 Body mass index (BMI) 20.0-20.9, adult: Secondary | ICD-10-CM | POA: Diagnosis not present

## 2022-05-30 DIAGNOSIS — I129 Hypertensive chronic kidney disease with stage 1 through stage 4 chronic kidney disease, or unspecified chronic kidney disease: Secondary | ICD-10-CM | POA: Diagnosis not present

## 2022-05-30 DIAGNOSIS — R634 Abnormal weight loss: Secondary | ICD-10-CM | POA: Diagnosis not present

## 2022-05-30 DIAGNOSIS — N183 Chronic kidney disease, stage 3 unspecified: Secondary | ICD-10-CM | POA: Diagnosis not present

## 2022-05-30 DIAGNOSIS — I447 Left bundle-branch block, unspecified: Secondary | ICD-10-CM | POA: Diagnosis not present

## 2022-06-02 DIAGNOSIS — I129 Hypertensive chronic kidney disease with stage 1 through stage 4 chronic kidney disease, or unspecified chronic kidney disease: Secondary | ICD-10-CM | POA: Diagnosis not present

## 2022-06-02 DIAGNOSIS — E785 Hyperlipidemia, unspecified: Secondary | ICD-10-CM | POA: Diagnosis not present

## 2022-06-02 DIAGNOSIS — Z6820 Body mass index (BMI) 20.0-20.9, adult: Secondary | ICD-10-CM | POA: Diagnosis not present

## 2022-06-02 DIAGNOSIS — I447 Left bundle-branch block, unspecified: Secondary | ICD-10-CM | POA: Diagnosis not present

## 2022-06-02 DIAGNOSIS — N183 Chronic kidney disease, stage 3 unspecified: Secondary | ICD-10-CM | POA: Diagnosis not present

## 2022-06-02 DIAGNOSIS — R634 Abnormal weight loss: Secondary | ICD-10-CM | POA: Diagnosis not present

## 2022-06-06 DIAGNOSIS — Z6820 Body mass index (BMI) 20.0-20.9, adult: Secondary | ICD-10-CM | POA: Diagnosis not present

## 2022-06-06 DIAGNOSIS — N183 Chronic kidney disease, stage 3 unspecified: Secondary | ICD-10-CM | POA: Diagnosis not present

## 2022-06-06 DIAGNOSIS — E785 Hyperlipidemia, unspecified: Secondary | ICD-10-CM | POA: Diagnosis not present

## 2022-06-06 DIAGNOSIS — I447 Left bundle-branch block, unspecified: Secondary | ICD-10-CM | POA: Diagnosis not present

## 2022-06-06 DIAGNOSIS — R634 Abnormal weight loss: Secondary | ICD-10-CM | POA: Diagnosis not present

## 2022-06-06 DIAGNOSIS — I129 Hypertensive chronic kidney disease with stage 1 through stage 4 chronic kidney disease, or unspecified chronic kidney disease: Secondary | ICD-10-CM | POA: Diagnosis not present

## 2022-06-07 DIAGNOSIS — R634 Abnormal weight loss: Secondary | ICD-10-CM | POA: Diagnosis not present

## 2022-06-07 DIAGNOSIS — N183 Chronic kidney disease, stage 3 unspecified: Secondary | ICD-10-CM | POA: Diagnosis not present

## 2022-06-07 DIAGNOSIS — I129 Hypertensive chronic kidney disease with stage 1 through stage 4 chronic kidney disease, or unspecified chronic kidney disease: Secondary | ICD-10-CM | POA: Diagnosis not present

## 2022-06-07 DIAGNOSIS — I447 Left bundle-branch block, unspecified: Secondary | ICD-10-CM | POA: Diagnosis not present

## 2022-06-07 DIAGNOSIS — Z6820 Body mass index (BMI) 20.0-20.9, adult: Secondary | ICD-10-CM | POA: Diagnosis not present

## 2022-06-07 DIAGNOSIS — E785 Hyperlipidemia, unspecified: Secondary | ICD-10-CM | POA: Diagnosis not present

## 2022-06-08 DIAGNOSIS — Z6820 Body mass index (BMI) 20.0-20.9, adult: Secondary | ICD-10-CM | POA: Diagnosis not present

## 2022-06-08 DIAGNOSIS — I447 Left bundle-branch block, unspecified: Secondary | ICD-10-CM | POA: Diagnosis not present

## 2022-06-08 DIAGNOSIS — R634 Abnormal weight loss: Secondary | ICD-10-CM | POA: Diagnosis not present

## 2022-06-08 DIAGNOSIS — E785 Hyperlipidemia, unspecified: Secondary | ICD-10-CM | POA: Diagnosis not present

## 2022-06-08 DIAGNOSIS — G309 Alzheimer's disease, unspecified: Secondary | ICD-10-CM | POA: Diagnosis not present

## 2022-06-08 DIAGNOSIS — I129 Hypertensive chronic kidney disease with stage 1 through stage 4 chronic kidney disease, or unspecified chronic kidney disease: Secondary | ICD-10-CM | POA: Diagnosis not present

## 2022-06-08 DIAGNOSIS — N183 Chronic kidney disease, stage 3 unspecified: Secondary | ICD-10-CM | POA: Diagnosis not present

## 2022-06-09 DIAGNOSIS — R634 Abnormal weight loss: Secondary | ICD-10-CM | POA: Diagnosis not present

## 2022-06-09 DIAGNOSIS — Z6820 Body mass index (BMI) 20.0-20.9, adult: Secondary | ICD-10-CM | POA: Diagnosis not present

## 2022-06-09 DIAGNOSIS — N183 Chronic kidney disease, stage 3 unspecified: Secondary | ICD-10-CM | POA: Diagnosis not present

## 2022-06-09 DIAGNOSIS — I447 Left bundle-branch block, unspecified: Secondary | ICD-10-CM | POA: Diagnosis not present

## 2022-06-09 DIAGNOSIS — E785 Hyperlipidemia, unspecified: Secondary | ICD-10-CM | POA: Diagnosis not present

## 2022-06-09 DIAGNOSIS — I129 Hypertensive chronic kidney disease with stage 1 through stage 4 chronic kidney disease, or unspecified chronic kidney disease: Secondary | ICD-10-CM | POA: Diagnosis not present

## 2022-06-10 DIAGNOSIS — N183 Chronic kidney disease, stage 3 unspecified: Secondary | ICD-10-CM | POA: Diagnosis not present

## 2022-06-10 DIAGNOSIS — R634 Abnormal weight loss: Secondary | ICD-10-CM | POA: Diagnosis not present

## 2022-06-10 DIAGNOSIS — I129 Hypertensive chronic kidney disease with stage 1 through stage 4 chronic kidney disease, or unspecified chronic kidney disease: Secondary | ICD-10-CM | POA: Diagnosis not present

## 2022-06-10 DIAGNOSIS — Z6820 Body mass index (BMI) 20.0-20.9, adult: Secondary | ICD-10-CM | POA: Diagnosis not present

## 2022-06-10 DIAGNOSIS — I447 Left bundle-branch block, unspecified: Secondary | ICD-10-CM | POA: Diagnosis not present

## 2022-06-10 DIAGNOSIS — E785 Hyperlipidemia, unspecified: Secondary | ICD-10-CM | POA: Diagnosis not present

## 2022-06-11 DIAGNOSIS — N183 Chronic kidney disease, stage 3 unspecified: Secondary | ICD-10-CM | POA: Diagnosis not present

## 2022-06-11 DIAGNOSIS — Z6820 Body mass index (BMI) 20.0-20.9, adult: Secondary | ICD-10-CM | POA: Diagnosis not present

## 2022-06-11 DIAGNOSIS — R634 Abnormal weight loss: Secondary | ICD-10-CM | POA: Diagnosis not present

## 2022-06-11 DIAGNOSIS — I129 Hypertensive chronic kidney disease with stage 1 through stage 4 chronic kidney disease, or unspecified chronic kidney disease: Secondary | ICD-10-CM | POA: Diagnosis not present

## 2022-06-11 DIAGNOSIS — I447 Left bundle-branch block, unspecified: Secondary | ICD-10-CM | POA: Diagnosis not present

## 2022-06-11 DIAGNOSIS — E785 Hyperlipidemia, unspecified: Secondary | ICD-10-CM | POA: Diagnosis not present

## 2022-06-12 DIAGNOSIS — I129 Hypertensive chronic kidney disease with stage 1 through stage 4 chronic kidney disease, or unspecified chronic kidney disease: Secondary | ICD-10-CM | POA: Diagnosis not present

## 2022-06-12 DIAGNOSIS — I447 Left bundle-branch block, unspecified: Secondary | ICD-10-CM | POA: Diagnosis not present

## 2022-06-12 DIAGNOSIS — Z6820 Body mass index (BMI) 20.0-20.9, adult: Secondary | ICD-10-CM | POA: Diagnosis not present

## 2022-06-12 DIAGNOSIS — N183 Chronic kidney disease, stage 3 unspecified: Secondary | ICD-10-CM | POA: Diagnosis not present

## 2022-06-12 DIAGNOSIS — E785 Hyperlipidemia, unspecified: Secondary | ICD-10-CM | POA: Diagnosis not present

## 2022-06-12 DIAGNOSIS — R634 Abnormal weight loss: Secondary | ICD-10-CM | POA: Diagnosis not present

## 2022-06-13 DIAGNOSIS — R634 Abnormal weight loss: Secondary | ICD-10-CM | POA: Diagnosis not present

## 2022-06-13 DIAGNOSIS — I129 Hypertensive chronic kidney disease with stage 1 through stage 4 chronic kidney disease, or unspecified chronic kidney disease: Secondary | ICD-10-CM | POA: Diagnosis not present

## 2022-06-13 DIAGNOSIS — Z6820 Body mass index (BMI) 20.0-20.9, adult: Secondary | ICD-10-CM | POA: Diagnosis not present

## 2022-06-13 DIAGNOSIS — E785 Hyperlipidemia, unspecified: Secondary | ICD-10-CM | POA: Diagnosis not present

## 2022-06-13 DIAGNOSIS — N183 Chronic kidney disease, stage 3 unspecified: Secondary | ICD-10-CM | POA: Diagnosis not present

## 2022-06-13 DIAGNOSIS — I447 Left bundle-branch block, unspecified: Secondary | ICD-10-CM | POA: Diagnosis not present

## 2022-06-14 DIAGNOSIS — N183 Chronic kidney disease, stage 3 unspecified: Secondary | ICD-10-CM | POA: Diagnosis not present

## 2022-06-14 DIAGNOSIS — I447 Left bundle-branch block, unspecified: Secondary | ICD-10-CM | POA: Diagnosis not present

## 2022-06-14 DIAGNOSIS — Z6820 Body mass index (BMI) 20.0-20.9, adult: Secondary | ICD-10-CM | POA: Diagnosis not present

## 2022-06-14 DIAGNOSIS — R634 Abnormal weight loss: Secondary | ICD-10-CM | POA: Diagnosis not present

## 2022-06-14 DIAGNOSIS — I129 Hypertensive chronic kidney disease with stage 1 through stage 4 chronic kidney disease, or unspecified chronic kidney disease: Secondary | ICD-10-CM | POA: Diagnosis not present

## 2022-06-14 DIAGNOSIS — E785 Hyperlipidemia, unspecified: Secondary | ICD-10-CM | POA: Diagnosis not present

## 2022-06-16 DIAGNOSIS — Z6820 Body mass index (BMI) 20.0-20.9, adult: Secondary | ICD-10-CM | POA: Diagnosis not present

## 2022-06-16 DIAGNOSIS — E785 Hyperlipidemia, unspecified: Secondary | ICD-10-CM | POA: Diagnosis not present

## 2022-06-16 DIAGNOSIS — I129 Hypertensive chronic kidney disease with stage 1 through stage 4 chronic kidney disease, or unspecified chronic kidney disease: Secondary | ICD-10-CM | POA: Diagnosis not present

## 2022-06-16 DIAGNOSIS — R634 Abnormal weight loss: Secondary | ICD-10-CM | POA: Diagnosis not present

## 2022-06-16 DIAGNOSIS — I447 Left bundle-branch block, unspecified: Secondary | ICD-10-CM | POA: Diagnosis not present

## 2022-06-16 DIAGNOSIS — N183 Chronic kidney disease, stage 3 unspecified: Secondary | ICD-10-CM | POA: Diagnosis not present

## 2022-06-17 DIAGNOSIS — R7401 Elevation of levels of liver transaminase levels: Secondary | ICD-10-CM | POA: Diagnosis not present

## 2022-06-17 DIAGNOSIS — H04129 Dry eye syndrome of unspecified lacrimal gland: Secondary | ICD-10-CM | POA: Diagnosis not present

## 2022-06-17 DIAGNOSIS — H3530 Unspecified macular degeneration: Secondary | ICD-10-CM | POA: Diagnosis not present

## 2022-06-17 DIAGNOSIS — D696 Thrombocytopenia, unspecified: Secondary | ICD-10-CM | POA: Diagnosis not present

## 2022-06-17 DIAGNOSIS — K811 Chronic cholecystitis: Secondary | ICD-10-CM | POA: Diagnosis not present

## 2022-06-17 DIAGNOSIS — I129 Hypertensive chronic kidney disease with stage 1 through stage 4 chronic kidney disease, or unspecified chronic kidney disease: Secondary | ICD-10-CM | POA: Diagnosis not present

## 2022-06-17 DIAGNOSIS — F0154 Vascular dementia, unspecified severity, with anxiety: Secondary | ICD-10-CM | POA: Diagnosis not present

## 2022-06-17 DIAGNOSIS — N183 Chronic kidney disease, stage 3 unspecified: Secondary | ICD-10-CM | POA: Diagnosis not present

## 2022-06-17 DIAGNOSIS — E559 Vitamin D deficiency, unspecified: Secondary | ICD-10-CM | POA: Diagnosis not present

## 2022-06-17 DIAGNOSIS — R627 Adult failure to thrive: Secondary | ICD-10-CM | POA: Diagnosis not present

## 2022-06-17 DIAGNOSIS — E8809 Other disorders of plasma-protein metabolism, not elsewhere classified: Secondary | ICD-10-CM | POA: Diagnosis not present

## 2022-06-17 DIAGNOSIS — S0181XD Laceration without foreign body of other part of head, subsequent encounter: Secondary | ICD-10-CM | POA: Diagnosis not present

## 2022-06-17 DIAGNOSIS — G250 Essential tremor: Secondary | ICD-10-CM | POA: Diagnosis not present

## 2022-06-17 DIAGNOSIS — D1803 Hemangioma of intra-abdominal structures: Secondary | ICD-10-CM | POA: Diagnosis not present

## 2022-06-17 DIAGNOSIS — K449 Diaphragmatic hernia without obstruction or gangrene: Secondary | ICD-10-CM | POA: Diagnosis not present

## 2022-06-17 DIAGNOSIS — E43 Unspecified severe protein-calorie malnutrition: Secondary | ICD-10-CM | POA: Diagnosis not present

## 2022-06-17 DIAGNOSIS — R634 Abnormal weight loss: Secondary | ICD-10-CM | POA: Diagnosis not present

## 2022-06-17 DIAGNOSIS — F0153 Vascular dementia, unspecified severity, with mood disturbance: Secondary | ICD-10-CM | POA: Diagnosis not present

## 2022-06-17 DIAGNOSIS — E039 Hypothyroidism, unspecified: Secondary | ICD-10-CM | POA: Diagnosis not present

## 2022-06-17 DIAGNOSIS — E785 Hyperlipidemia, unspecified: Secondary | ICD-10-CM | POA: Diagnosis not present

## 2022-06-17 DIAGNOSIS — Z681 Body mass index (BMI) 19 or less, adult: Secondary | ICD-10-CM | POA: Diagnosis not present

## 2022-06-17 DIAGNOSIS — I447 Left bundle-branch block, unspecified: Secondary | ICD-10-CM | POA: Diagnosis not present

## 2022-06-17 DIAGNOSIS — K219 Gastro-esophageal reflux disease without esophagitis: Secondary | ICD-10-CM | POA: Diagnosis not present

## 2022-06-17 DIAGNOSIS — G4700 Insomnia, unspecified: Secondary | ICD-10-CM | POA: Diagnosis not present

## 2022-06-20 DIAGNOSIS — I129 Hypertensive chronic kidney disease with stage 1 through stage 4 chronic kidney disease, or unspecified chronic kidney disease: Secondary | ICD-10-CM | POA: Diagnosis not present

## 2022-06-20 DIAGNOSIS — E785 Hyperlipidemia, unspecified: Secondary | ICD-10-CM | POA: Diagnosis not present

## 2022-06-20 DIAGNOSIS — N183 Chronic kidney disease, stage 3 unspecified: Secondary | ICD-10-CM | POA: Diagnosis not present

## 2022-06-20 DIAGNOSIS — S0181XD Laceration without foreign body of other part of head, subsequent encounter: Secondary | ICD-10-CM | POA: Diagnosis not present

## 2022-06-20 DIAGNOSIS — Z681 Body mass index (BMI) 19 or less, adult: Secondary | ICD-10-CM | POA: Diagnosis not present

## 2022-06-20 DIAGNOSIS — R634 Abnormal weight loss: Secondary | ICD-10-CM | POA: Diagnosis not present

## 2022-06-22 DIAGNOSIS — E785 Hyperlipidemia, unspecified: Secondary | ICD-10-CM | POA: Diagnosis not present

## 2022-06-22 DIAGNOSIS — R634 Abnormal weight loss: Secondary | ICD-10-CM | POA: Diagnosis not present

## 2022-06-22 DIAGNOSIS — N183 Chronic kidney disease, stage 3 unspecified: Secondary | ICD-10-CM | POA: Diagnosis not present

## 2022-06-22 DIAGNOSIS — I129 Hypertensive chronic kidney disease with stage 1 through stage 4 chronic kidney disease, or unspecified chronic kidney disease: Secondary | ICD-10-CM | POA: Diagnosis not present

## 2022-06-22 DIAGNOSIS — S0181XD Laceration without foreign body of other part of head, subsequent encounter: Secondary | ICD-10-CM | POA: Diagnosis not present

## 2022-06-22 DIAGNOSIS — Z681 Body mass index (BMI) 19 or less, adult: Secondary | ICD-10-CM | POA: Diagnosis not present

## 2022-06-23 DIAGNOSIS — N183 Chronic kidney disease, stage 3 unspecified: Secondary | ICD-10-CM | POA: Diagnosis not present

## 2022-06-23 DIAGNOSIS — Z681 Body mass index (BMI) 19 or less, adult: Secondary | ICD-10-CM | POA: Diagnosis not present

## 2022-06-23 DIAGNOSIS — R634 Abnormal weight loss: Secondary | ICD-10-CM | POA: Diagnosis not present

## 2022-06-23 DIAGNOSIS — S0181XD Laceration without foreign body of other part of head, subsequent encounter: Secondary | ICD-10-CM | POA: Diagnosis not present

## 2022-06-23 DIAGNOSIS — E785 Hyperlipidemia, unspecified: Secondary | ICD-10-CM | POA: Diagnosis not present

## 2022-06-23 DIAGNOSIS — I129 Hypertensive chronic kidney disease with stage 1 through stage 4 chronic kidney disease, or unspecified chronic kidney disease: Secondary | ICD-10-CM | POA: Diagnosis not present

## 2022-06-27 DIAGNOSIS — N183 Chronic kidney disease, stage 3 unspecified: Secondary | ICD-10-CM | POA: Diagnosis not present

## 2022-06-27 DIAGNOSIS — S0181XD Laceration without foreign body of other part of head, subsequent encounter: Secondary | ICD-10-CM | POA: Diagnosis not present

## 2022-06-27 DIAGNOSIS — R634 Abnormal weight loss: Secondary | ICD-10-CM | POA: Diagnosis not present

## 2022-06-27 DIAGNOSIS — E785 Hyperlipidemia, unspecified: Secondary | ICD-10-CM | POA: Diagnosis not present

## 2022-06-27 DIAGNOSIS — Z681 Body mass index (BMI) 19 or less, adult: Secondary | ICD-10-CM | POA: Diagnosis not present

## 2022-06-27 DIAGNOSIS — I129 Hypertensive chronic kidney disease with stage 1 through stage 4 chronic kidney disease, or unspecified chronic kidney disease: Secondary | ICD-10-CM | POA: Diagnosis not present

## 2022-06-28 DIAGNOSIS — N183 Chronic kidney disease, stage 3 unspecified: Secondary | ICD-10-CM | POA: Diagnosis not present

## 2022-06-28 DIAGNOSIS — E785 Hyperlipidemia, unspecified: Secondary | ICD-10-CM | POA: Diagnosis not present

## 2022-06-28 DIAGNOSIS — I129 Hypertensive chronic kidney disease with stage 1 through stage 4 chronic kidney disease, or unspecified chronic kidney disease: Secondary | ICD-10-CM | POA: Diagnosis not present

## 2022-06-28 DIAGNOSIS — R634 Abnormal weight loss: Secondary | ICD-10-CM | POA: Diagnosis not present

## 2022-06-28 DIAGNOSIS — Z681 Body mass index (BMI) 19 or less, adult: Secondary | ICD-10-CM | POA: Diagnosis not present

## 2022-06-28 DIAGNOSIS — S0181XD Laceration without foreign body of other part of head, subsequent encounter: Secondary | ICD-10-CM | POA: Diagnosis not present

## 2022-06-30 DIAGNOSIS — Z681 Body mass index (BMI) 19 or less, adult: Secondary | ICD-10-CM | POA: Diagnosis not present

## 2022-06-30 DIAGNOSIS — E785 Hyperlipidemia, unspecified: Secondary | ICD-10-CM | POA: Diagnosis not present

## 2022-06-30 DIAGNOSIS — N183 Chronic kidney disease, stage 3 unspecified: Secondary | ICD-10-CM | POA: Diagnosis not present

## 2022-06-30 DIAGNOSIS — R634 Abnormal weight loss: Secondary | ICD-10-CM | POA: Diagnosis not present

## 2022-06-30 DIAGNOSIS — I129 Hypertensive chronic kidney disease with stage 1 through stage 4 chronic kidney disease, or unspecified chronic kidney disease: Secondary | ICD-10-CM | POA: Diagnosis not present

## 2022-06-30 DIAGNOSIS — S0181XD Laceration without foreign body of other part of head, subsequent encounter: Secondary | ICD-10-CM | POA: Diagnosis not present

## 2022-07-04 DIAGNOSIS — R634 Abnormal weight loss: Secondary | ICD-10-CM | POA: Diagnosis not present

## 2022-07-04 DIAGNOSIS — N183 Chronic kidney disease, stage 3 unspecified: Secondary | ICD-10-CM | POA: Diagnosis not present

## 2022-07-04 DIAGNOSIS — Z681 Body mass index (BMI) 19 or less, adult: Secondary | ICD-10-CM | POA: Diagnosis not present

## 2022-07-04 DIAGNOSIS — I129 Hypertensive chronic kidney disease with stage 1 through stage 4 chronic kidney disease, or unspecified chronic kidney disease: Secondary | ICD-10-CM | POA: Diagnosis not present

## 2022-07-04 DIAGNOSIS — S0181XD Laceration without foreign body of other part of head, subsequent encounter: Secondary | ICD-10-CM | POA: Diagnosis not present

## 2022-07-04 DIAGNOSIS — E785 Hyperlipidemia, unspecified: Secondary | ICD-10-CM | POA: Diagnosis not present

## 2022-07-06 DIAGNOSIS — S0181XD Laceration without foreign body of other part of head, subsequent encounter: Secondary | ICD-10-CM | POA: Diagnosis not present

## 2022-07-06 DIAGNOSIS — N183 Chronic kidney disease, stage 3 unspecified: Secondary | ICD-10-CM | POA: Diagnosis not present

## 2022-07-06 DIAGNOSIS — R634 Abnormal weight loss: Secondary | ICD-10-CM | POA: Diagnosis not present

## 2022-07-06 DIAGNOSIS — E785 Hyperlipidemia, unspecified: Secondary | ICD-10-CM | POA: Diagnosis not present

## 2022-07-06 DIAGNOSIS — Z681 Body mass index (BMI) 19 or less, adult: Secondary | ICD-10-CM | POA: Diagnosis not present

## 2022-07-06 DIAGNOSIS — I129 Hypertensive chronic kidney disease with stage 1 through stage 4 chronic kidney disease, or unspecified chronic kidney disease: Secondary | ICD-10-CM | POA: Diagnosis not present

## 2022-07-07 DIAGNOSIS — I129 Hypertensive chronic kidney disease with stage 1 through stage 4 chronic kidney disease, or unspecified chronic kidney disease: Secondary | ICD-10-CM | POA: Diagnosis not present

## 2022-07-07 DIAGNOSIS — R634 Abnormal weight loss: Secondary | ICD-10-CM | POA: Diagnosis not present

## 2022-07-07 DIAGNOSIS — Z681 Body mass index (BMI) 19 or less, adult: Secondary | ICD-10-CM | POA: Diagnosis not present

## 2022-07-07 DIAGNOSIS — S0181XD Laceration without foreign body of other part of head, subsequent encounter: Secondary | ICD-10-CM | POA: Diagnosis not present

## 2022-07-07 DIAGNOSIS — N183 Chronic kidney disease, stage 3 unspecified: Secondary | ICD-10-CM | POA: Diagnosis not present

## 2022-07-07 DIAGNOSIS — E785 Hyperlipidemia, unspecified: Secondary | ICD-10-CM | POA: Diagnosis not present

## 2022-07-11 DIAGNOSIS — R634 Abnormal weight loss: Secondary | ICD-10-CM | POA: Diagnosis not present

## 2022-07-11 DIAGNOSIS — I129 Hypertensive chronic kidney disease with stage 1 through stage 4 chronic kidney disease, or unspecified chronic kidney disease: Secondary | ICD-10-CM | POA: Diagnosis not present

## 2022-07-11 DIAGNOSIS — Z681 Body mass index (BMI) 19 or less, adult: Secondary | ICD-10-CM | POA: Diagnosis not present

## 2022-07-11 DIAGNOSIS — E785 Hyperlipidemia, unspecified: Secondary | ICD-10-CM | POA: Diagnosis not present

## 2022-07-11 DIAGNOSIS — S0181XD Laceration without foreign body of other part of head, subsequent encounter: Secondary | ICD-10-CM | POA: Diagnosis not present

## 2022-07-11 DIAGNOSIS — N183 Chronic kidney disease, stage 3 unspecified: Secondary | ICD-10-CM | POA: Diagnosis not present

## 2022-07-12 DIAGNOSIS — E785 Hyperlipidemia, unspecified: Secondary | ICD-10-CM | POA: Diagnosis not present

## 2022-07-12 DIAGNOSIS — S0181XD Laceration without foreign body of other part of head, subsequent encounter: Secondary | ICD-10-CM | POA: Diagnosis not present

## 2022-07-12 DIAGNOSIS — R634 Abnormal weight loss: Secondary | ICD-10-CM | POA: Diagnosis not present

## 2022-07-12 DIAGNOSIS — Z681 Body mass index (BMI) 19 or less, adult: Secondary | ICD-10-CM | POA: Diagnosis not present

## 2022-07-12 DIAGNOSIS — N183 Chronic kidney disease, stage 3 unspecified: Secondary | ICD-10-CM | POA: Diagnosis not present

## 2022-07-12 DIAGNOSIS — I129 Hypertensive chronic kidney disease with stage 1 through stage 4 chronic kidney disease, or unspecified chronic kidney disease: Secondary | ICD-10-CM | POA: Diagnosis not present

## 2022-07-14 DIAGNOSIS — R634 Abnormal weight loss: Secondary | ICD-10-CM | POA: Diagnosis not present

## 2022-07-14 DIAGNOSIS — N183 Chronic kidney disease, stage 3 unspecified: Secondary | ICD-10-CM | POA: Diagnosis not present

## 2022-07-14 DIAGNOSIS — E785 Hyperlipidemia, unspecified: Secondary | ICD-10-CM | POA: Diagnosis not present

## 2022-07-14 DIAGNOSIS — S0181XD Laceration without foreign body of other part of head, subsequent encounter: Secondary | ICD-10-CM | POA: Diagnosis not present

## 2022-07-14 DIAGNOSIS — Z681 Body mass index (BMI) 19 or less, adult: Secondary | ICD-10-CM | POA: Diagnosis not present

## 2022-07-14 DIAGNOSIS — I129 Hypertensive chronic kidney disease with stage 1 through stage 4 chronic kidney disease, or unspecified chronic kidney disease: Secondary | ICD-10-CM | POA: Diagnosis not present

## 2022-07-15 DIAGNOSIS — R634 Abnormal weight loss: Secondary | ICD-10-CM | POA: Diagnosis not present

## 2022-07-15 DIAGNOSIS — S0181XD Laceration without foreign body of other part of head, subsequent encounter: Secondary | ICD-10-CM | POA: Diagnosis not present

## 2022-07-15 DIAGNOSIS — N183 Chronic kidney disease, stage 3 unspecified: Secondary | ICD-10-CM | POA: Diagnosis not present

## 2022-07-15 DIAGNOSIS — E785 Hyperlipidemia, unspecified: Secondary | ICD-10-CM | POA: Diagnosis not present

## 2022-07-15 DIAGNOSIS — Z681 Body mass index (BMI) 19 or less, adult: Secondary | ICD-10-CM | POA: Diagnosis not present

## 2022-07-15 DIAGNOSIS — I129 Hypertensive chronic kidney disease with stage 1 through stage 4 chronic kidney disease, or unspecified chronic kidney disease: Secondary | ICD-10-CM | POA: Diagnosis not present

## 2022-07-16 DIAGNOSIS — N183 Chronic kidney disease, stage 3 unspecified: Secondary | ICD-10-CM | POA: Diagnosis not present

## 2022-07-16 DIAGNOSIS — S0181XD Laceration without foreign body of other part of head, subsequent encounter: Secondary | ICD-10-CM | POA: Diagnosis not present

## 2022-07-16 DIAGNOSIS — I129 Hypertensive chronic kidney disease with stage 1 through stage 4 chronic kidney disease, or unspecified chronic kidney disease: Secondary | ICD-10-CM | POA: Diagnosis not present

## 2022-07-16 DIAGNOSIS — R634 Abnormal weight loss: Secondary | ICD-10-CM | POA: Diagnosis not present

## 2022-07-16 DIAGNOSIS — Z681 Body mass index (BMI) 19 or less, adult: Secondary | ICD-10-CM | POA: Diagnosis not present

## 2022-07-16 DIAGNOSIS — E785 Hyperlipidemia, unspecified: Secondary | ICD-10-CM | POA: Diagnosis not present

## 2022-07-17 DIAGNOSIS — H04129 Dry eye syndrome of unspecified lacrimal gland: Secondary | ICD-10-CM | POA: Diagnosis not present

## 2022-07-17 DIAGNOSIS — R627 Adult failure to thrive: Secondary | ICD-10-CM | POA: Diagnosis not present

## 2022-07-17 DIAGNOSIS — E8809 Other disorders of plasma-protein metabolism, not elsewhere classified: Secondary | ICD-10-CM | POA: Diagnosis not present

## 2022-07-17 DIAGNOSIS — R7401 Elevation of levels of liver transaminase levels: Secondary | ICD-10-CM | POA: Diagnosis not present

## 2022-07-17 DIAGNOSIS — E785 Hyperlipidemia, unspecified: Secondary | ICD-10-CM | POA: Diagnosis not present

## 2022-07-17 DIAGNOSIS — G4700 Insomnia, unspecified: Secondary | ICD-10-CM | POA: Diagnosis not present

## 2022-07-17 DIAGNOSIS — K811 Chronic cholecystitis: Secondary | ICD-10-CM | POA: Diagnosis not present

## 2022-07-17 DIAGNOSIS — N183 Chronic kidney disease, stage 3 unspecified: Secondary | ICD-10-CM | POA: Diagnosis not present

## 2022-07-17 DIAGNOSIS — S0181XD Laceration without foreign body of other part of head, subsequent encounter: Secondary | ICD-10-CM | POA: Diagnosis not present

## 2022-07-17 DIAGNOSIS — I447 Left bundle-branch block, unspecified: Secondary | ICD-10-CM | POA: Diagnosis not present

## 2022-07-17 DIAGNOSIS — G250 Essential tremor: Secondary | ICD-10-CM | POA: Diagnosis not present

## 2022-07-17 DIAGNOSIS — R634 Abnormal weight loss: Secondary | ICD-10-CM | POA: Diagnosis not present

## 2022-07-17 DIAGNOSIS — F0153 Vascular dementia, unspecified severity, with mood disturbance: Secondary | ICD-10-CM | POA: Diagnosis not present

## 2022-07-17 DIAGNOSIS — K449 Diaphragmatic hernia without obstruction or gangrene: Secondary | ICD-10-CM | POA: Diagnosis not present

## 2022-07-17 DIAGNOSIS — E559 Vitamin D deficiency, unspecified: Secondary | ICD-10-CM | POA: Diagnosis not present

## 2022-07-17 DIAGNOSIS — K219 Gastro-esophageal reflux disease without esophagitis: Secondary | ICD-10-CM | POA: Diagnosis not present

## 2022-07-17 DIAGNOSIS — D696 Thrombocytopenia, unspecified: Secondary | ICD-10-CM | POA: Diagnosis not present

## 2022-07-17 DIAGNOSIS — E039 Hypothyroidism, unspecified: Secondary | ICD-10-CM | POA: Diagnosis not present

## 2022-07-17 DIAGNOSIS — D1803 Hemangioma of intra-abdominal structures: Secondary | ICD-10-CM | POA: Diagnosis not present

## 2022-07-17 DIAGNOSIS — I129 Hypertensive chronic kidney disease with stage 1 through stage 4 chronic kidney disease, or unspecified chronic kidney disease: Secondary | ICD-10-CM | POA: Diagnosis not present

## 2022-07-17 DIAGNOSIS — F0154 Vascular dementia, unspecified severity, with anxiety: Secondary | ICD-10-CM | POA: Diagnosis not present

## 2022-07-17 DIAGNOSIS — E43 Unspecified severe protein-calorie malnutrition: Secondary | ICD-10-CM | POA: Diagnosis not present

## 2022-07-17 DIAGNOSIS — Z681 Body mass index (BMI) 19 or less, adult: Secondary | ICD-10-CM | POA: Diagnosis not present

## 2022-07-17 DIAGNOSIS — H3530 Unspecified macular degeneration: Secondary | ICD-10-CM | POA: Diagnosis not present

## 2022-07-18 DIAGNOSIS — Z681 Body mass index (BMI) 19 or less, adult: Secondary | ICD-10-CM | POA: Diagnosis not present

## 2022-07-18 DIAGNOSIS — I129 Hypertensive chronic kidney disease with stage 1 through stage 4 chronic kidney disease, or unspecified chronic kidney disease: Secondary | ICD-10-CM | POA: Diagnosis not present

## 2022-07-18 DIAGNOSIS — E785 Hyperlipidemia, unspecified: Secondary | ICD-10-CM | POA: Diagnosis not present

## 2022-07-18 DIAGNOSIS — S0181XD Laceration without foreign body of other part of head, subsequent encounter: Secondary | ICD-10-CM | POA: Diagnosis not present

## 2022-07-18 DIAGNOSIS — N183 Chronic kidney disease, stage 3 unspecified: Secondary | ICD-10-CM | POA: Diagnosis not present

## 2022-07-18 DIAGNOSIS — R634 Abnormal weight loss: Secondary | ICD-10-CM | POA: Diagnosis not present

## 2022-07-19 DIAGNOSIS — E785 Hyperlipidemia, unspecified: Secondary | ICD-10-CM | POA: Diagnosis not present

## 2022-07-19 DIAGNOSIS — I129 Hypertensive chronic kidney disease with stage 1 through stage 4 chronic kidney disease, or unspecified chronic kidney disease: Secondary | ICD-10-CM | POA: Diagnosis not present

## 2022-07-19 DIAGNOSIS — R634 Abnormal weight loss: Secondary | ICD-10-CM | POA: Diagnosis not present

## 2022-07-19 DIAGNOSIS — N183 Chronic kidney disease, stage 3 unspecified: Secondary | ICD-10-CM | POA: Diagnosis not present

## 2022-07-19 DIAGNOSIS — S0181XD Laceration without foreign body of other part of head, subsequent encounter: Secondary | ICD-10-CM | POA: Diagnosis not present

## 2022-07-19 DIAGNOSIS — Z681 Body mass index (BMI) 19 or less, adult: Secondary | ICD-10-CM | POA: Diagnosis not present

## 2022-07-20 DIAGNOSIS — E785 Hyperlipidemia, unspecified: Secondary | ICD-10-CM | POA: Diagnosis not present

## 2022-07-20 DIAGNOSIS — N183 Chronic kidney disease, stage 3 unspecified: Secondary | ICD-10-CM | POA: Diagnosis not present

## 2022-07-20 DIAGNOSIS — I129 Hypertensive chronic kidney disease with stage 1 through stage 4 chronic kidney disease, or unspecified chronic kidney disease: Secondary | ICD-10-CM | POA: Diagnosis not present

## 2022-07-20 DIAGNOSIS — R634 Abnormal weight loss: Secondary | ICD-10-CM | POA: Diagnosis not present

## 2022-07-20 DIAGNOSIS — S0181XD Laceration without foreign body of other part of head, subsequent encounter: Secondary | ICD-10-CM | POA: Diagnosis not present

## 2022-07-20 DIAGNOSIS — Z681 Body mass index (BMI) 19 or less, adult: Secondary | ICD-10-CM | POA: Diagnosis not present

## 2022-07-21 DIAGNOSIS — I129 Hypertensive chronic kidney disease with stage 1 through stage 4 chronic kidney disease, or unspecified chronic kidney disease: Secondary | ICD-10-CM | POA: Diagnosis not present

## 2022-07-21 DIAGNOSIS — Z681 Body mass index (BMI) 19 or less, adult: Secondary | ICD-10-CM | POA: Diagnosis not present

## 2022-07-21 DIAGNOSIS — S0181XD Laceration without foreign body of other part of head, subsequent encounter: Secondary | ICD-10-CM | POA: Diagnosis not present

## 2022-07-21 DIAGNOSIS — R634 Abnormal weight loss: Secondary | ICD-10-CM | POA: Diagnosis not present

## 2022-07-21 DIAGNOSIS — E785 Hyperlipidemia, unspecified: Secondary | ICD-10-CM | POA: Diagnosis not present

## 2022-07-21 DIAGNOSIS — N183 Chronic kidney disease, stage 3 unspecified: Secondary | ICD-10-CM | POA: Diagnosis not present

## 2022-07-22 DIAGNOSIS — I129 Hypertensive chronic kidney disease with stage 1 through stage 4 chronic kidney disease, or unspecified chronic kidney disease: Secondary | ICD-10-CM | POA: Diagnosis not present

## 2022-07-22 DIAGNOSIS — S0181XD Laceration without foreign body of other part of head, subsequent encounter: Secondary | ICD-10-CM | POA: Diagnosis not present

## 2022-07-22 DIAGNOSIS — R634 Abnormal weight loss: Secondary | ICD-10-CM | POA: Diagnosis not present

## 2022-07-22 DIAGNOSIS — E785 Hyperlipidemia, unspecified: Secondary | ICD-10-CM | POA: Diagnosis not present

## 2022-07-22 DIAGNOSIS — N183 Chronic kidney disease, stage 3 unspecified: Secondary | ICD-10-CM | POA: Diagnosis not present

## 2022-07-22 DIAGNOSIS — Z681 Body mass index (BMI) 19 or less, adult: Secondary | ICD-10-CM | POA: Diagnosis not present

## 2022-07-25 DIAGNOSIS — Z681 Body mass index (BMI) 19 or less, adult: Secondary | ICD-10-CM | POA: Diagnosis not present

## 2022-07-25 DIAGNOSIS — S0181XD Laceration without foreign body of other part of head, subsequent encounter: Secondary | ICD-10-CM | POA: Diagnosis not present

## 2022-07-25 DIAGNOSIS — N183 Chronic kidney disease, stage 3 unspecified: Secondary | ICD-10-CM | POA: Diagnosis not present

## 2022-07-25 DIAGNOSIS — I129 Hypertensive chronic kidney disease with stage 1 through stage 4 chronic kidney disease, or unspecified chronic kidney disease: Secondary | ICD-10-CM | POA: Diagnosis not present

## 2022-07-25 DIAGNOSIS — E785 Hyperlipidemia, unspecified: Secondary | ICD-10-CM | POA: Diagnosis not present

## 2022-07-25 DIAGNOSIS — R634 Abnormal weight loss: Secondary | ICD-10-CM | POA: Diagnosis not present

## 2022-07-26 DIAGNOSIS — E785 Hyperlipidemia, unspecified: Secondary | ICD-10-CM | POA: Diagnosis not present

## 2022-07-26 DIAGNOSIS — R634 Abnormal weight loss: Secondary | ICD-10-CM | POA: Diagnosis not present

## 2022-07-26 DIAGNOSIS — Z681 Body mass index (BMI) 19 or less, adult: Secondary | ICD-10-CM | POA: Diagnosis not present

## 2022-07-26 DIAGNOSIS — S0181XD Laceration without foreign body of other part of head, subsequent encounter: Secondary | ICD-10-CM | POA: Diagnosis not present

## 2022-07-26 DIAGNOSIS — I129 Hypertensive chronic kidney disease with stage 1 through stage 4 chronic kidney disease, or unspecified chronic kidney disease: Secondary | ICD-10-CM | POA: Diagnosis not present

## 2022-07-26 DIAGNOSIS — N183 Chronic kidney disease, stage 3 unspecified: Secondary | ICD-10-CM | POA: Diagnosis not present

## 2022-07-28 DIAGNOSIS — I129 Hypertensive chronic kidney disease with stage 1 through stage 4 chronic kidney disease, or unspecified chronic kidney disease: Secondary | ICD-10-CM | POA: Diagnosis not present

## 2022-07-28 DIAGNOSIS — R634 Abnormal weight loss: Secondary | ICD-10-CM | POA: Diagnosis not present

## 2022-07-28 DIAGNOSIS — Z681 Body mass index (BMI) 19 or less, adult: Secondary | ICD-10-CM | POA: Diagnosis not present

## 2022-07-28 DIAGNOSIS — E785 Hyperlipidemia, unspecified: Secondary | ICD-10-CM | POA: Diagnosis not present

## 2022-07-28 DIAGNOSIS — S0181XD Laceration without foreign body of other part of head, subsequent encounter: Secondary | ICD-10-CM | POA: Diagnosis not present

## 2022-07-28 DIAGNOSIS — N183 Chronic kidney disease, stage 3 unspecified: Secondary | ICD-10-CM | POA: Diagnosis not present

## 2022-08-01 DIAGNOSIS — I129 Hypertensive chronic kidney disease with stage 1 through stage 4 chronic kidney disease, or unspecified chronic kidney disease: Secondary | ICD-10-CM | POA: Diagnosis not present

## 2022-08-01 DIAGNOSIS — R634 Abnormal weight loss: Secondary | ICD-10-CM | POA: Diagnosis not present

## 2022-08-01 DIAGNOSIS — E785 Hyperlipidemia, unspecified: Secondary | ICD-10-CM | POA: Diagnosis not present

## 2022-08-01 DIAGNOSIS — S0181XD Laceration without foreign body of other part of head, subsequent encounter: Secondary | ICD-10-CM | POA: Diagnosis not present

## 2022-08-01 DIAGNOSIS — Z681 Body mass index (BMI) 19 or less, adult: Secondary | ICD-10-CM | POA: Diagnosis not present

## 2022-08-01 DIAGNOSIS — N183 Chronic kidney disease, stage 3 unspecified: Secondary | ICD-10-CM | POA: Diagnosis not present

## 2022-08-02 DIAGNOSIS — I129 Hypertensive chronic kidney disease with stage 1 through stage 4 chronic kidney disease, or unspecified chronic kidney disease: Secondary | ICD-10-CM | POA: Diagnosis not present

## 2022-08-02 DIAGNOSIS — N183 Chronic kidney disease, stage 3 unspecified: Secondary | ICD-10-CM | POA: Diagnosis not present

## 2022-08-02 DIAGNOSIS — Z681 Body mass index (BMI) 19 or less, adult: Secondary | ICD-10-CM | POA: Diagnosis not present

## 2022-08-02 DIAGNOSIS — R634 Abnormal weight loss: Secondary | ICD-10-CM | POA: Diagnosis not present

## 2022-08-02 DIAGNOSIS — E785 Hyperlipidemia, unspecified: Secondary | ICD-10-CM | POA: Diagnosis not present

## 2022-08-02 DIAGNOSIS — S0181XD Laceration without foreign body of other part of head, subsequent encounter: Secondary | ICD-10-CM | POA: Diagnosis not present

## 2022-08-03 DIAGNOSIS — Z681 Body mass index (BMI) 19 or less, adult: Secondary | ICD-10-CM | POA: Diagnosis not present

## 2022-08-03 DIAGNOSIS — E785 Hyperlipidemia, unspecified: Secondary | ICD-10-CM | POA: Diagnosis not present

## 2022-08-03 DIAGNOSIS — R634 Abnormal weight loss: Secondary | ICD-10-CM | POA: Diagnosis not present

## 2022-08-03 DIAGNOSIS — S0181XD Laceration without foreign body of other part of head, subsequent encounter: Secondary | ICD-10-CM | POA: Diagnosis not present

## 2022-08-03 DIAGNOSIS — I129 Hypertensive chronic kidney disease with stage 1 through stage 4 chronic kidney disease, or unspecified chronic kidney disease: Secondary | ICD-10-CM | POA: Diagnosis not present

## 2022-08-03 DIAGNOSIS — N183 Chronic kidney disease, stage 3 unspecified: Secondary | ICD-10-CM | POA: Diagnosis not present

## 2022-08-04 DIAGNOSIS — E785 Hyperlipidemia, unspecified: Secondary | ICD-10-CM | POA: Diagnosis not present

## 2022-08-04 DIAGNOSIS — Z681 Body mass index (BMI) 19 or less, adult: Secondary | ICD-10-CM | POA: Diagnosis not present

## 2022-08-04 DIAGNOSIS — I129 Hypertensive chronic kidney disease with stage 1 through stage 4 chronic kidney disease, or unspecified chronic kidney disease: Secondary | ICD-10-CM | POA: Diagnosis not present

## 2022-08-04 DIAGNOSIS — N183 Chronic kidney disease, stage 3 unspecified: Secondary | ICD-10-CM | POA: Diagnosis not present

## 2022-08-04 DIAGNOSIS — R634 Abnormal weight loss: Secondary | ICD-10-CM | POA: Diagnosis not present

## 2022-08-04 DIAGNOSIS — S0181XD Laceration without foreign body of other part of head, subsequent encounter: Secondary | ICD-10-CM | POA: Diagnosis not present

## 2022-08-05 DIAGNOSIS — R634 Abnormal weight loss: Secondary | ICD-10-CM | POA: Diagnosis not present

## 2022-08-05 DIAGNOSIS — N183 Chronic kidney disease, stage 3 unspecified: Secondary | ICD-10-CM | POA: Diagnosis not present

## 2022-08-05 DIAGNOSIS — Z681 Body mass index (BMI) 19 or less, adult: Secondary | ICD-10-CM | POA: Diagnosis not present

## 2022-08-05 DIAGNOSIS — E785 Hyperlipidemia, unspecified: Secondary | ICD-10-CM | POA: Diagnosis not present

## 2022-08-05 DIAGNOSIS — I129 Hypertensive chronic kidney disease with stage 1 through stage 4 chronic kidney disease, or unspecified chronic kidney disease: Secondary | ICD-10-CM | POA: Diagnosis not present

## 2022-08-05 DIAGNOSIS — S0181XD Laceration without foreign body of other part of head, subsequent encounter: Secondary | ICD-10-CM | POA: Diagnosis not present

## 2022-08-08 DIAGNOSIS — R634 Abnormal weight loss: Secondary | ICD-10-CM | POA: Diagnosis not present

## 2022-08-08 DIAGNOSIS — E785 Hyperlipidemia, unspecified: Secondary | ICD-10-CM | POA: Diagnosis not present

## 2022-08-08 DIAGNOSIS — N183 Chronic kidney disease, stage 3 unspecified: Secondary | ICD-10-CM | POA: Diagnosis not present

## 2022-08-08 DIAGNOSIS — Z681 Body mass index (BMI) 19 or less, adult: Secondary | ICD-10-CM | POA: Diagnosis not present

## 2022-08-08 DIAGNOSIS — S0181XD Laceration without foreign body of other part of head, subsequent encounter: Secondary | ICD-10-CM | POA: Diagnosis not present

## 2022-08-08 DIAGNOSIS — I129 Hypertensive chronic kidney disease with stage 1 through stage 4 chronic kidney disease, or unspecified chronic kidney disease: Secondary | ICD-10-CM | POA: Diagnosis not present

## 2022-08-09 DIAGNOSIS — Z681 Body mass index (BMI) 19 or less, adult: Secondary | ICD-10-CM | POA: Diagnosis not present

## 2022-08-09 DIAGNOSIS — R634 Abnormal weight loss: Secondary | ICD-10-CM | POA: Diagnosis not present

## 2022-08-09 DIAGNOSIS — N183 Chronic kidney disease, stage 3 unspecified: Secondary | ICD-10-CM | POA: Diagnosis not present

## 2022-08-09 DIAGNOSIS — I129 Hypertensive chronic kidney disease with stage 1 through stage 4 chronic kidney disease, or unspecified chronic kidney disease: Secondary | ICD-10-CM | POA: Diagnosis not present

## 2022-08-09 DIAGNOSIS — E785 Hyperlipidemia, unspecified: Secondary | ICD-10-CM | POA: Diagnosis not present

## 2022-08-09 DIAGNOSIS — S0181XD Laceration without foreign body of other part of head, subsequent encounter: Secondary | ICD-10-CM | POA: Diagnosis not present

## 2022-08-10 DIAGNOSIS — S0181XD Laceration without foreign body of other part of head, subsequent encounter: Secondary | ICD-10-CM | POA: Diagnosis not present

## 2022-08-10 DIAGNOSIS — E785 Hyperlipidemia, unspecified: Secondary | ICD-10-CM | POA: Diagnosis not present

## 2022-08-10 DIAGNOSIS — R634 Abnormal weight loss: Secondary | ICD-10-CM | POA: Diagnosis not present

## 2022-08-10 DIAGNOSIS — N183 Chronic kidney disease, stage 3 unspecified: Secondary | ICD-10-CM | POA: Diagnosis not present

## 2022-08-10 DIAGNOSIS — I129 Hypertensive chronic kidney disease with stage 1 through stage 4 chronic kidney disease, or unspecified chronic kidney disease: Secondary | ICD-10-CM | POA: Diagnosis not present

## 2022-08-10 DIAGNOSIS — Z681 Body mass index (BMI) 19 or less, adult: Secondary | ICD-10-CM | POA: Diagnosis not present

## 2022-08-11 DIAGNOSIS — E785 Hyperlipidemia, unspecified: Secondary | ICD-10-CM | POA: Diagnosis not present

## 2022-08-11 DIAGNOSIS — S0181XD Laceration without foreign body of other part of head, subsequent encounter: Secondary | ICD-10-CM | POA: Diagnosis not present

## 2022-08-11 DIAGNOSIS — Z681 Body mass index (BMI) 19 or less, adult: Secondary | ICD-10-CM | POA: Diagnosis not present

## 2022-08-11 DIAGNOSIS — I129 Hypertensive chronic kidney disease with stage 1 through stage 4 chronic kidney disease, or unspecified chronic kidney disease: Secondary | ICD-10-CM | POA: Diagnosis not present

## 2022-08-11 DIAGNOSIS — R634 Abnormal weight loss: Secondary | ICD-10-CM | POA: Diagnosis not present

## 2022-08-11 DIAGNOSIS — N183 Chronic kidney disease, stage 3 unspecified: Secondary | ICD-10-CM | POA: Diagnosis not present

## 2022-08-12 DIAGNOSIS — E785 Hyperlipidemia, unspecified: Secondary | ICD-10-CM | POA: Diagnosis not present

## 2022-08-12 DIAGNOSIS — R634 Abnormal weight loss: Secondary | ICD-10-CM | POA: Diagnosis not present

## 2022-08-12 DIAGNOSIS — N183 Chronic kidney disease, stage 3 unspecified: Secondary | ICD-10-CM | POA: Diagnosis not present

## 2022-08-12 DIAGNOSIS — Z681 Body mass index (BMI) 19 or less, adult: Secondary | ICD-10-CM | POA: Diagnosis not present

## 2022-08-12 DIAGNOSIS — I129 Hypertensive chronic kidney disease with stage 1 through stage 4 chronic kidney disease, or unspecified chronic kidney disease: Secondary | ICD-10-CM | POA: Diagnosis not present

## 2022-08-12 DIAGNOSIS — S0181XD Laceration without foreign body of other part of head, subsequent encounter: Secondary | ICD-10-CM | POA: Diagnosis not present

## 2022-08-13 DIAGNOSIS — I129 Hypertensive chronic kidney disease with stage 1 through stage 4 chronic kidney disease, or unspecified chronic kidney disease: Secondary | ICD-10-CM | POA: Diagnosis not present

## 2022-08-13 DIAGNOSIS — N183 Chronic kidney disease, stage 3 unspecified: Secondary | ICD-10-CM | POA: Diagnosis not present

## 2022-08-13 DIAGNOSIS — E785 Hyperlipidemia, unspecified: Secondary | ICD-10-CM | POA: Diagnosis not present

## 2022-08-13 DIAGNOSIS — R634 Abnormal weight loss: Secondary | ICD-10-CM | POA: Diagnosis not present

## 2022-08-13 DIAGNOSIS — Z681 Body mass index (BMI) 19 or less, adult: Secondary | ICD-10-CM | POA: Diagnosis not present

## 2022-08-13 DIAGNOSIS — S0181XD Laceration without foreign body of other part of head, subsequent encounter: Secondary | ICD-10-CM | POA: Diagnosis not present

## 2022-08-15 ENCOUNTER — Telehealth: Payer: Self-pay

## 2022-08-15 DIAGNOSIS — Z681 Body mass index (BMI) 19 or less, adult: Secondary | ICD-10-CM | POA: Diagnosis not present

## 2022-08-15 DIAGNOSIS — S0181XD Laceration without foreign body of other part of head, subsequent encounter: Secondary | ICD-10-CM | POA: Diagnosis not present

## 2022-08-15 DIAGNOSIS — R634 Abnormal weight loss: Secondary | ICD-10-CM | POA: Diagnosis not present

## 2022-08-15 DIAGNOSIS — E785 Hyperlipidemia, unspecified: Secondary | ICD-10-CM | POA: Diagnosis not present

## 2022-08-15 DIAGNOSIS — I129 Hypertensive chronic kidney disease with stage 1 through stage 4 chronic kidney disease, or unspecified chronic kidney disease: Secondary | ICD-10-CM | POA: Diagnosis not present

## 2022-08-15 DIAGNOSIS — N183 Chronic kidney disease, stage 3 unspecified: Secondary | ICD-10-CM | POA: Diagnosis not present

## 2022-08-15 NOTE — Telephone Encounter (Signed)
Nurse Manuela Schwartz from [facility] called and states that patient has been with them 6 months. She states that patient needs recertification because she is still declining. She needed the OK for patient to continue hospice. Approval given. Message routed to PCP [place], Nelda Bucks, NP as Juluis Rainier.

## 2022-08-15 NOTE — Telephone Encounter (Signed)
Okay to give verbal orders to re certify due to decline in condition.

## 2022-08-17 DIAGNOSIS — E039 Hypothyroidism, unspecified: Secondary | ICD-10-CM | POA: Diagnosis not present

## 2022-08-17 DIAGNOSIS — S0181XD Laceration without foreign body of other part of head, subsequent encounter: Secondary | ICD-10-CM | POA: Diagnosis not present

## 2022-08-17 DIAGNOSIS — K811 Chronic cholecystitis: Secondary | ICD-10-CM | POA: Diagnosis not present

## 2022-08-17 DIAGNOSIS — I129 Hypertensive chronic kidney disease with stage 1 through stage 4 chronic kidney disease, or unspecified chronic kidney disease: Secondary | ICD-10-CM | POA: Diagnosis not present

## 2022-08-17 DIAGNOSIS — G250 Essential tremor: Secondary | ICD-10-CM | POA: Diagnosis not present

## 2022-08-17 DIAGNOSIS — N183 Chronic kidney disease, stage 3 unspecified: Secondary | ICD-10-CM | POA: Diagnosis not present

## 2022-08-17 DIAGNOSIS — R627 Adult failure to thrive: Secondary | ICD-10-CM | POA: Diagnosis not present

## 2022-08-17 DIAGNOSIS — R7401 Elevation of levels of liver transaminase levels: Secondary | ICD-10-CM | POA: Diagnosis not present

## 2022-08-17 DIAGNOSIS — K449 Diaphragmatic hernia without obstruction or gangrene: Secondary | ICD-10-CM | POA: Diagnosis not present

## 2022-08-17 DIAGNOSIS — D696 Thrombocytopenia, unspecified: Secondary | ICD-10-CM | POA: Diagnosis not present

## 2022-08-17 DIAGNOSIS — H04129 Dry eye syndrome of unspecified lacrimal gland: Secondary | ICD-10-CM | POA: Diagnosis not present

## 2022-08-17 DIAGNOSIS — R634 Abnormal weight loss: Secondary | ICD-10-CM | POA: Diagnosis not present

## 2022-08-17 DIAGNOSIS — H3530 Unspecified macular degeneration: Secondary | ICD-10-CM | POA: Diagnosis not present

## 2022-08-17 DIAGNOSIS — E559 Vitamin D deficiency, unspecified: Secondary | ICD-10-CM | POA: Diagnosis not present

## 2022-08-17 DIAGNOSIS — F0153 Vascular dementia, unspecified severity, with mood disturbance: Secondary | ICD-10-CM | POA: Diagnosis not present

## 2022-08-17 DIAGNOSIS — I447 Left bundle-branch block, unspecified: Secondary | ICD-10-CM | POA: Diagnosis not present

## 2022-08-17 DIAGNOSIS — E43 Unspecified severe protein-calorie malnutrition: Secondary | ICD-10-CM | POA: Diagnosis not present

## 2022-08-17 DIAGNOSIS — E8809 Other disorders of plasma-protein metabolism, not elsewhere classified: Secondary | ICD-10-CM | POA: Diagnosis not present

## 2022-08-17 DIAGNOSIS — G4700 Insomnia, unspecified: Secondary | ICD-10-CM | POA: Diagnosis not present

## 2022-08-17 DIAGNOSIS — K219 Gastro-esophageal reflux disease without esophagitis: Secondary | ICD-10-CM | POA: Diagnosis not present

## 2022-08-17 DIAGNOSIS — D1803 Hemangioma of intra-abdominal structures: Secondary | ICD-10-CM | POA: Diagnosis not present

## 2022-08-17 DIAGNOSIS — Z681 Body mass index (BMI) 19 or less, adult: Secondary | ICD-10-CM | POA: Diagnosis not present

## 2022-08-17 DIAGNOSIS — F0154 Vascular dementia, unspecified severity, with anxiety: Secondary | ICD-10-CM | POA: Diagnosis not present

## 2022-08-17 DIAGNOSIS — E785 Hyperlipidemia, unspecified: Secondary | ICD-10-CM | POA: Diagnosis not present

## 2022-08-18 DIAGNOSIS — Z681 Body mass index (BMI) 19 or less, adult: Secondary | ICD-10-CM | POA: Diagnosis not present

## 2022-08-18 DIAGNOSIS — E785 Hyperlipidemia, unspecified: Secondary | ICD-10-CM | POA: Diagnosis not present

## 2022-08-18 DIAGNOSIS — N183 Chronic kidney disease, stage 3 unspecified: Secondary | ICD-10-CM | POA: Diagnosis not present

## 2022-08-18 DIAGNOSIS — I129 Hypertensive chronic kidney disease with stage 1 through stage 4 chronic kidney disease, or unspecified chronic kidney disease: Secondary | ICD-10-CM | POA: Diagnosis not present

## 2022-08-18 DIAGNOSIS — S0181XD Laceration without foreign body of other part of head, subsequent encounter: Secondary | ICD-10-CM | POA: Diagnosis not present

## 2022-08-18 DIAGNOSIS — R634 Abnormal weight loss: Secondary | ICD-10-CM | POA: Diagnosis not present

## 2022-08-19 DIAGNOSIS — E785 Hyperlipidemia, unspecified: Secondary | ICD-10-CM | POA: Diagnosis not present

## 2022-08-19 DIAGNOSIS — R634 Abnormal weight loss: Secondary | ICD-10-CM | POA: Diagnosis not present

## 2022-08-19 DIAGNOSIS — Z681 Body mass index (BMI) 19 or less, adult: Secondary | ICD-10-CM | POA: Diagnosis not present

## 2022-08-19 DIAGNOSIS — I129 Hypertensive chronic kidney disease with stage 1 through stage 4 chronic kidney disease, or unspecified chronic kidney disease: Secondary | ICD-10-CM | POA: Diagnosis not present

## 2022-08-19 DIAGNOSIS — N183 Chronic kidney disease, stage 3 unspecified: Secondary | ICD-10-CM | POA: Diagnosis not present

## 2022-08-19 DIAGNOSIS — S0181XD Laceration without foreign body of other part of head, subsequent encounter: Secondary | ICD-10-CM | POA: Diagnosis not present

## 2022-08-22 DIAGNOSIS — E785 Hyperlipidemia, unspecified: Secondary | ICD-10-CM | POA: Diagnosis not present

## 2022-08-22 DIAGNOSIS — I129 Hypertensive chronic kidney disease with stage 1 through stage 4 chronic kidney disease, or unspecified chronic kidney disease: Secondary | ICD-10-CM | POA: Diagnosis not present

## 2022-08-22 DIAGNOSIS — N183 Chronic kidney disease, stage 3 unspecified: Secondary | ICD-10-CM | POA: Diagnosis not present

## 2022-08-22 DIAGNOSIS — R634 Abnormal weight loss: Secondary | ICD-10-CM | POA: Diagnosis not present

## 2022-08-22 DIAGNOSIS — S0181XD Laceration without foreign body of other part of head, subsequent encounter: Secondary | ICD-10-CM | POA: Diagnosis not present

## 2022-08-22 DIAGNOSIS — Z681 Body mass index (BMI) 19 or less, adult: Secondary | ICD-10-CM | POA: Diagnosis not present

## 2022-08-23 DIAGNOSIS — E785 Hyperlipidemia, unspecified: Secondary | ICD-10-CM | POA: Diagnosis not present

## 2022-08-23 DIAGNOSIS — N183 Chronic kidney disease, stage 3 unspecified: Secondary | ICD-10-CM | POA: Diagnosis not present

## 2022-08-23 DIAGNOSIS — R634 Abnormal weight loss: Secondary | ICD-10-CM | POA: Diagnosis not present

## 2022-08-23 DIAGNOSIS — Z681 Body mass index (BMI) 19 or less, adult: Secondary | ICD-10-CM | POA: Diagnosis not present

## 2022-08-23 DIAGNOSIS — I129 Hypertensive chronic kidney disease with stage 1 through stage 4 chronic kidney disease, or unspecified chronic kidney disease: Secondary | ICD-10-CM | POA: Diagnosis not present

## 2022-08-23 DIAGNOSIS — S0181XD Laceration without foreign body of other part of head, subsequent encounter: Secondary | ICD-10-CM | POA: Diagnosis not present

## 2022-08-25 DIAGNOSIS — S0181XD Laceration without foreign body of other part of head, subsequent encounter: Secondary | ICD-10-CM | POA: Diagnosis not present

## 2022-08-25 DIAGNOSIS — E785 Hyperlipidemia, unspecified: Secondary | ICD-10-CM | POA: Diagnosis not present

## 2022-08-25 DIAGNOSIS — N183 Chronic kidney disease, stage 3 unspecified: Secondary | ICD-10-CM | POA: Diagnosis not present

## 2022-08-25 DIAGNOSIS — Z681 Body mass index (BMI) 19 or less, adult: Secondary | ICD-10-CM | POA: Diagnosis not present

## 2022-08-25 DIAGNOSIS — R634 Abnormal weight loss: Secondary | ICD-10-CM | POA: Diagnosis not present

## 2022-08-25 DIAGNOSIS — I129 Hypertensive chronic kidney disease with stage 1 through stage 4 chronic kidney disease, or unspecified chronic kidney disease: Secondary | ICD-10-CM | POA: Diagnosis not present

## 2022-08-29 DIAGNOSIS — Z681 Body mass index (BMI) 19 or less, adult: Secondary | ICD-10-CM | POA: Diagnosis not present

## 2022-08-29 DIAGNOSIS — I129 Hypertensive chronic kidney disease with stage 1 through stage 4 chronic kidney disease, or unspecified chronic kidney disease: Secondary | ICD-10-CM | POA: Diagnosis not present

## 2022-08-29 DIAGNOSIS — S0181XD Laceration without foreign body of other part of head, subsequent encounter: Secondary | ICD-10-CM | POA: Diagnosis not present

## 2022-08-29 DIAGNOSIS — R634 Abnormal weight loss: Secondary | ICD-10-CM | POA: Diagnosis not present

## 2022-08-29 DIAGNOSIS — N183 Chronic kidney disease, stage 3 unspecified: Secondary | ICD-10-CM | POA: Diagnosis not present

## 2022-08-29 DIAGNOSIS — E785 Hyperlipidemia, unspecified: Secondary | ICD-10-CM | POA: Diagnosis not present

## 2022-08-30 DIAGNOSIS — I129 Hypertensive chronic kidney disease with stage 1 through stage 4 chronic kidney disease, or unspecified chronic kidney disease: Secondary | ICD-10-CM | POA: Diagnosis not present

## 2022-08-30 DIAGNOSIS — R634 Abnormal weight loss: Secondary | ICD-10-CM | POA: Diagnosis not present

## 2022-08-30 DIAGNOSIS — E785 Hyperlipidemia, unspecified: Secondary | ICD-10-CM | POA: Diagnosis not present

## 2022-08-30 DIAGNOSIS — N183 Chronic kidney disease, stage 3 unspecified: Secondary | ICD-10-CM | POA: Diagnosis not present

## 2022-08-30 DIAGNOSIS — S0181XD Laceration without foreign body of other part of head, subsequent encounter: Secondary | ICD-10-CM | POA: Diagnosis not present

## 2022-08-30 DIAGNOSIS — Z681 Body mass index (BMI) 19 or less, adult: Secondary | ICD-10-CM | POA: Diagnosis not present

## 2022-08-31 DIAGNOSIS — N183 Chronic kidney disease, stage 3 unspecified: Secondary | ICD-10-CM | POA: Diagnosis not present

## 2022-08-31 DIAGNOSIS — R634 Abnormal weight loss: Secondary | ICD-10-CM | POA: Diagnosis not present

## 2022-08-31 DIAGNOSIS — E785 Hyperlipidemia, unspecified: Secondary | ICD-10-CM | POA: Diagnosis not present

## 2022-08-31 DIAGNOSIS — Z681 Body mass index (BMI) 19 or less, adult: Secondary | ICD-10-CM | POA: Diagnosis not present

## 2022-08-31 DIAGNOSIS — S0181XD Laceration without foreign body of other part of head, subsequent encounter: Secondary | ICD-10-CM | POA: Diagnosis not present

## 2022-08-31 DIAGNOSIS — I129 Hypertensive chronic kidney disease with stage 1 through stage 4 chronic kidney disease, or unspecified chronic kidney disease: Secondary | ICD-10-CM | POA: Diagnosis not present

## 2022-09-01 DIAGNOSIS — R634 Abnormal weight loss: Secondary | ICD-10-CM | POA: Diagnosis not present

## 2022-09-01 DIAGNOSIS — Z681 Body mass index (BMI) 19 or less, adult: Secondary | ICD-10-CM | POA: Diagnosis not present

## 2022-09-01 DIAGNOSIS — E785 Hyperlipidemia, unspecified: Secondary | ICD-10-CM | POA: Diagnosis not present

## 2022-09-01 DIAGNOSIS — I129 Hypertensive chronic kidney disease with stage 1 through stage 4 chronic kidney disease, or unspecified chronic kidney disease: Secondary | ICD-10-CM | POA: Diagnosis not present

## 2022-09-01 DIAGNOSIS — S0181XD Laceration without foreign body of other part of head, subsequent encounter: Secondary | ICD-10-CM | POA: Diagnosis not present

## 2022-09-01 DIAGNOSIS — N183 Chronic kidney disease, stage 3 unspecified: Secondary | ICD-10-CM | POA: Diagnosis not present

## 2022-09-02 DIAGNOSIS — Z681 Body mass index (BMI) 19 or less, adult: Secondary | ICD-10-CM | POA: Diagnosis not present

## 2022-09-02 DIAGNOSIS — I129 Hypertensive chronic kidney disease with stage 1 through stage 4 chronic kidney disease, or unspecified chronic kidney disease: Secondary | ICD-10-CM | POA: Diagnosis not present

## 2022-09-02 DIAGNOSIS — R634 Abnormal weight loss: Secondary | ICD-10-CM | POA: Diagnosis not present

## 2022-09-02 DIAGNOSIS — E785 Hyperlipidemia, unspecified: Secondary | ICD-10-CM | POA: Diagnosis not present

## 2022-09-02 DIAGNOSIS — S0181XD Laceration without foreign body of other part of head, subsequent encounter: Secondary | ICD-10-CM | POA: Diagnosis not present

## 2022-09-02 DIAGNOSIS — N183 Chronic kidney disease, stage 3 unspecified: Secondary | ICD-10-CM | POA: Diagnosis not present

## 2022-09-03 DIAGNOSIS — N183 Chronic kidney disease, stage 3 unspecified: Secondary | ICD-10-CM | POA: Diagnosis not present

## 2022-09-03 DIAGNOSIS — S0181XD Laceration without foreign body of other part of head, subsequent encounter: Secondary | ICD-10-CM | POA: Diagnosis not present

## 2022-09-03 DIAGNOSIS — Z681 Body mass index (BMI) 19 or less, adult: Secondary | ICD-10-CM | POA: Diagnosis not present

## 2022-09-03 DIAGNOSIS — I129 Hypertensive chronic kidney disease with stage 1 through stage 4 chronic kidney disease, or unspecified chronic kidney disease: Secondary | ICD-10-CM | POA: Diagnosis not present

## 2022-09-03 DIAGNOSIS — R634 Abnormal weight loss: Secondary | ICD-10-CM | POA: Diagnosis not present

## 2022-09-03 DIAGNOSIS — E785 Hyperlipidemia, unspecified: Secondary | ICD-10-CM | POA: Diagnosis not present

## 2022-09-04 DIAGNOSIS — I129 Hypertensive chronic kidney disease with stage 1 through stage 4 chronic kidney disease, or unspecified chronic kidney disease: Secondary | ICD-10-CM | POA: Diagnosis not present

## 2022-09-04 DIAGNOSIS — E785 Hyperlipidemia, unspecified: Secondary | ICD-10-CM | POA: Diagnosis not present

## 2022-09-04 DIAGNOSIS — Z681 Body mass index (BMI) 19 or less, adult: Secondary | ICD-10-CM | POA: Diagnosis not present

## 2022-09-04 DIAGNOSIS — N183 Chronic kidney disease, stage 3 unspecified: Secondary | ICD-10-CM | POA: Diagnosis not present

## 2022-09-04 DIAGNOSIS — S0181XD Laceration without foreign body of other part of head, subsequent encounter: Secondary | ICD-10-CM | POA: Diagnosis not present

## 2022-09-04 DIAGNOSIS — R634 Abnormal weight loss: Secondary | ICD-10-CM | POA: Diagnosis not present

## 2022-09-05 DIAGNOSIS — N183 Chronic kidney disease, stage 3 unspecified: Secondary | ICD-10-CM | POA: Diagnosis not present

## 2022-09-05 DIAGNOSIS — R634 Abnormal weight loss: Secondary | ICD-10-CM | POA: Diagnosis not present

## 2022-09-05 DIAGNOSIS — I129 Hypertensive chronic kidney disease with stage 1 through stage 4 chronic kidney disease, or unspecified chronic kidney disease: Secondary | ICD-10-CM | POA: Diagnosis not present

## 2022-09-05 DIAGNOSIS — E785 Hyperlipidemia, unspecified: Secondary | ICD-10-CM | POA: Diagnosis not present

## 2022-09-05 DIAGNOSIS — S0181XD Laceration without foreign body of other part of head, subsequent encounter: Secondary | ICD-10-CM | POA: Diagnosis not present

## 2022-09-05 DIAGNOSIS — Z681 Body mass index (BMI) 19 or less, adult: Secondary | ICD-10-CM | POA: Diagnosis not present

## 2022-09-06 DIAGNOSIS — I129 Hypertensive chronic kidney disease with stage 1 through stage 4 chronic kidney disease, or unspecified chronic kidney disease: Secondary | ICD-10-CM | POA: Diagnosis not present

## 2022-09-06 DIAGNOSIS — R634 Abnormal weight loss: Secondary | ICD-10-CM | POA: Diagnosis not present

## 2022-09-06 DIAGNOSIS — Z681 Body mass index (BMI) 19 or less, adult: Secondary | ICD-10-CM | POA: Diagnosis not present

## 2022-09-06 DIAGNOSIS — E785 Hyperlipidemia, unspecified: Secondary | ICD-10-CM | POA: Diagnosis not present

## 2022-09-06 DIAGNOSIS — N183 Chronic kidney disease, stage 3 unspecified: Secondary | ICD-10-CM | POA: Diagnosis not present

## 2022-09-06 DIAGNOSIS — S0181XD Laceration without foreign body of other part of head, subsequent encounter: Secondary | ICD-10-CM | POA: Diagnosis not present

## 2022-09-07 DIAGNOSIS — E785 Hyperlipidemia, unspecified: Secondary | ICD-10-CM | POA: Diagnosis not present

## 2022-09-07 DIAGNOSIS — N183 Chronic kidney disease, stage 3 unspecified: Secondary | ICD-10-CM | POA: Diagnosis not present

## 2022-09-07 DIAGNOSIS — S0181XD Laceration without foreign body of other part of head, subsequent encounter: Secondary | ICD-10-CM | POA: Diagnosis not present

## 2022-09-07 DIAGNOSIS — Z681 Body mass index (BMI) 19 or less, adult: Secondary | ICD-10-CM | POA: Diagnosis not present

## 2022-09-07 DIAGNOSIS — I129 Hypertensive chronic kidney disease with stage 1 through stage 4 chronic kidney disease, or unspecified chronic kidney disease: Secondary | ICD-10-CM | POA: Diagnosis not present

## 2022-09-07 DIAGNOSIS — R634 Abnormal weight loss: Secondary | ICD-10-CM | POA: Diagnosis not present

## 2022-09-08 DIAGNOSIS — N183 Chronic kidney disease, stage 3 unspecified: Secondary | ICD-10-CM | POA: Diagnosis not present

## 2022-09-08 DIAGNOSIS — R634 Abnormal weight loss: Secondary | ICD-10-CM | POA: Diagnosis not present

## 2022-09-08 DIAGNOSIS — S0181XD Laceration without foreign body of other part of head, subsequent encounter: Secondary | ICD-10-CM | POA: Diagnosis not present

## 2022-09-08 DIAGNOSIS — E785 Hyperlipidemia, unspecified: Secondary | ICD-10-CM | POA: Diagnosis not present

## 2022-09-08 DIAGNOSIS — I129 Hypertensive chronic kidney disease with stage 1 through stage 4 chronic kidney disease, or unspecified chronic kidney disease: Secondary | ICD-10-CM | POA: Diagnosis not present

## 2022-09-08 DIAGNOSIS — Z681 Body mass index (BMI) 19 or less, adult: Secondary | ICD-10-CM | POA: Diagnosis not present

## 2022-09-09 DIAGNOSIS — E785 Hyperlipidemia, unspecified: Secondary | ICD-10-CM | POA: Diagnosis not present

## 2022-09-09 DIAGNOSIS — N183 Chronic kidney disease, stage 3 unspecified: Secondary | ICD-10-CM | POA: Diagnosis not present

## 2022-09-09 DIAGNOSIS — Z681 Body mass index (BMI) 19 or less, adult: Secondary | ICD-10-CM | POA: Diagnosis not present

## 2022-09-09 DIAGNOSIS — R634 Abnormal weight loss: Secondary | ICD-10-CM | POA: Diagnosis not present

## 2022-09-09 DIAGNOSIS — S0181XD Laceration without foreign body of other part of head, subsequent encounter: Secondary | ICD-10-CM | POA: Diagnosis not present

## 2022-09-09 DIAGNOSIS — I129 Hypertensive chronic kidney disease with stage 1 through stage 4 chronic kidney disease, or unspecified chronic kidney disease: Secondary | ICD-10-CM | POA: Diagnosis not present

## 2022-09-10 DIAGNOSIS — S0181XD Laceration without foreign body of other part of head, subsequent encounter: Secondary | ICD-10-CM | POA: Diagnosis not present

## 2022-09-10 DIAGNOSIS — E785 Hyperlipidemia, unspecified: Secondary | ICD-10-CM | POA: Diagnosis not present

## 2022-09-10 DIAGNOSIS — I129 Hypertensive chronic kidney disease with stage 1 through stage 4 chronic kidney disease, or unspecified chronic kidney disease: Secondary | ICD-10-CM | POA: Diagnosis not present

## 2022-09-10 DIAGNOSIS — N183 Chronic kidney disease, stage 3 unspecified: Secondary | ICD-10-CM | POA: Diagnosis not present

## 2022-09-10 DIAGNOSIS — Z681 Body mass index (BMI) 19 or less, adult: Secondary | ICD-10-CM | POA: Diagnosis not present

## 2022-09-10 DIAGNOSIS — R634 Abnormal weight loss: Secondary | ICD-10-CM | POA: Diagnosis not present

## 2022-09-11 DIAGNOSIS — Z681 Body mass index (BMI) 19 or less, adult: Secondary | ICD-10-CM | POA: Diagnosis not present

## 2022-09-11 DIAGNOSIS — E785 Hyperlipidemia, unspecified: Secondary | ICD-10-CM | POA: Diagnosis not present

## 2022-09-11 DIAGNOSIS — N183 Chronic kidney disease, stage 3 unspecified: Secondary | ICD-10-CM | POA: Diagnosis not present

## 2022-09-11 DIAGNOSIS — S0181XD Laceration without foreign body of other part of head, subsequent encounter: Secondary | ICD-10-CM | POA: Diagnosis not present

## 2022-09-11 DIAGNOSIS — R634 Abnormal weight loss: Secondary | ICD-10-CM | POA: Diagnosis not present

## 2022-09-11 DIAGNOSIS — I129 Hypertensive chronic kidney disease with stage 1 through stage 4 chronic kidney disease, or unspecified chronic kidney disease: Secondary | ICD-10-CM | POA: Diagnosis not present

## 2022-09-12 DIAGNOSIS — N183 Chronic kidney disease, stage 3 unspecified: Secondary | ICD-10-CM | POA: Diagnosis not present

## 2022-09-12 DIAGNOSIS — E785 Hyperlipidemia, unspecified: Secondary | ICD-10-CM | POA: Diagnosis not present

## 2022-09-12 DIAGNOSIS — Z681 Body mass index (BMI) 19 or less, adult: Secondary | ICD-10-CM | POA: Diagnosis not present

## 2022-09-12 DIAGNOSIS — R634 Abnormal weight loss: Secondary | ICD-10-CM | POA: Diagnosis not present

## 2022-09-12 DIAGNOSIS — S0181XD Laceration without foreign body of other part of head, subsequent encounter: Secondary | ICD-10-CM | POA: Diagnosis not present

## 2022-09-12 DIAGNOSIS — I129 Hypertensive chronic kidney disease with stage 1 through stage 4 chronic kidney disease, or unspecified chronic kidney disease: Secondary | ICD-10-CM | POA: Diagnosis not present

## 2022-09-13 DIAGNOSIS — E785 Hyperlipidemia, unspecified: Secondary | ICD-10-CM | POA: Diagnosis not present

## 2022-09-13 DIAGNOSIS — N183 Chronic kidney disease, stage 3 unspecified: Secondary | ICD-10-CM | POA: Diagnosis not present

## 2022-09-13 DIAGNOSIS — R634 Abnormal weight loss: Secondary | ICD-10-CM | POA: Diagnosis not present

## 2022-09-13 DIAGNOSIS — I129 Hypertensive chronic kidney disease with stage 1 through stage 4 chronic kidney disease, or unspecified chronic kidney disease: Secondary | ICD-10-CM | POA: Diagnosis not present

## 2022-09-13 DIAGNOSIS — Z681 Body mass index (BMI) 19 or less, adult: Secondary | ICD-10-CM | POA: Diagnosis not present

## 2022-09-13 DIAGNOSIS — S0181XD Laceration without foreign body of other part of head, subsequent encounter: Secondary | ICD-10-CM | POA: Diagnosis not present

## 2022-09-14 DIAGNOSIS — R634 Abnormal weight loss: Secondary | ICD-10-CM | POA: Diagnosis not present

## 2022-09-14 DIAGNOSIS — S0181XD Laceration without foreign body of other part of head, subsequent encounter: Secondary | ICD-10-CM | POA: Diagnosis not present

## 2022-09-14 DIAGNOSIS — I129 Hypertensive chronic kidney disease with stage 1 through stage 4 chronic kidney disease, or unspecified chronic kidney disease: Secondary | ICD-10-CM | POA: Diagnosis not present

## 2022-09-14 DIAGNOSIS — E785 Hyperlipidemia, unspecified: Secondary | ICD-10-CM | POA: Diagnosis not present

## 2022-09-14 DIAGNOSIS — Z681 Body mass index (BMI) 19 or less, adult: Secondary | ICD-10-CM | POA: Diagnosis not present

## 2022-09-14 DIAGNOSIS — N183 Chronic kidney disease, stage 3 unspecified: Secondary | ICD-10-CM | POA: Diagnosis not present

## 2022-09-15 DIAGNOSIS — R634 Abnormal weight loss: Secondary | ICD-10-CM | POA: Diagnosis not present

## 2022-09-15 DIAGNOSIS — I129 Hypertensive chronic kidney disease with stage 1 through stage 4 chronic kidney disease, or unspecified chronic kidney disease: Secondary | ICD-10-CM | POA: Diagnosis not present

## 2022-09-15 DIAGNOSIS — N183 Chronic kidney disease, stage 3 unspecified: Secondary | ICD-10-CM | POA: Diagnosis not present

## 2022-09-15 DIAGNOSIS — Z681 Body mass index (BMI) 19 or less, adult: Secondary | ICD-10-CM | POA: Diagnosis not present

## 2022-09-15 DIAGNOSIS — E785 Hyperlipidemia, unspecified: Secondary | ICD-10-CM | POA: Diagnosis not present

## 2022-09-15 DIAGNOSIS — S0181XD Laceration without foreign body of other part of head, subsequent encounter: Secondary | ICD-10-CM | POA: Diagnosis not present

## 2022-09-16 DIAGNOSIS — E785 Hyperlipidemia, unspecified: Secondary | ICD-10-CM | POA: Diagnosis not present

## 2022-09-16 DIAGNOSIS — I447 Left bundle-branch block, unspecified: Secondary | ICD-10-CM | POA: Diagnosis not present

## 2022-09-16 DIAGNOSIS — E559 Vitamin D deficiency, unspecified: Secondary | ICD-10-CM | POA: Diagnosis not present

## 2022-09-16 DIAGNOSIS — E43 Unspecified severe protein-calorie malnutrition: Secondary | ICD-10-CM | POA: Diagnosis not present

## 2022-09-16 DIAGNOSIS — D696 Thrombocytopenia, unspecified: Secondary | ICD-10-CM | POA: Diagnosis not present

## 2022-09-16 DIAGNOSIS — R634 Abnormal weight loss: Secondary | ICD-10-CM | POA: Diagnosis not present

## 2022-09-16 DIAGNOSIS — Z681 Body mass index (BMI) 19 or less, adult: Secondary | ICD-10-CM | POA: Diagnosis not present

## 2022-09-16 DIAGNOSIS — H04129 Dry eye syndrome of unspecified lacrimal gland: Secondary | ICD-10-CM | POA: Diagnosis not present

## 2022-09-16 DIAGNOSIS — N183 Chronic kidney disease, stage 3 unspecified: Secondary | ICD-10-CM | POA: Diagnosis not present

## 2022-09-16 DIAGNOSIS — R7401 Elevation of levels of liver transaminase levels: Secondary | ICD-10-CM | POA: Diagnosis not present

## 2022-09-16 DIAGNOSIS — R627 Adult failure to thrive: Secondary | ICD-10-CM | POA: Diagnosis not present

## 2022-09-16 DIAGNOSIS — S0181XD Laceration without foreign body of other part of head, subsequent encounter: Secondary | ICD-10-CM | POA: Diagnosis not present

## 2022-09-16 DIAGNOSIS — F0154 Vascular dementia, unspecified severity, with anxiety: Secondary | ICD-10-CM | POA: Diagnosis not present

## 2022-09-16 DIAGNOSIS — F0153 Vascular dementia, unspecified severity, with mood disturbance: Secondary | ICD-10-CM | POA: Diagnosis not present

## 2022-09-16 DIAGNOSIS — I129 Hypertensive chronic kidney disease with stage 1 through stage 4 chronic kidney disease, or unspecified chronic kidney disease: Secondary | ICD-10-CM | POA: Diagnosis not present

## 2022-09-16 DIAGNOSIS — K219 Gastro-esophageal reflux disease without esophagitis: Secondary | ICD-10-CM | POA: Diagnosis not present

## 2022-09-16 DIAGNOSIS — G250 Essential tremor: Secondary | ICD-10-CM | POA: Diagnosis not present

## 2022-09-16 DIAGNOSIS — D1803 Hemangioma of intra-abdominal structures: Secondary | ICD-10-CM | POA: Diagnosis not present

## 2022-09-16 DIAGNOSIS — K449 Diaphragmatic hernia without obstruction or gangrene: Secondary | ICD-10-CM | POA: Diagnosis not present

## 2022-09-16 DIAGNOSIS — H3530 Unspecified macular degeneration: Secondary | ICD-10-CM | POA: Diagnosis not present

## 2022-09-16 DIAGNOSIS — E039 Hypothyroidism, unspecified: Secondary | ICD-10-CM | POA: Diagnosis not present

## 2022-09-16 DIAGNOSIS — K811 Chronic cholecystitis: Secondary | ICD-10-CM | POA: Diagnosis not present

## 2022-09-16 DIAGNOSIS — G4700 Insomnia, unspecified: Secondary | ICD-10-CM | POA: Diagnosis not present

## 2022-09-16 DIAGNOSIS — E8809 Other disorders of plasma-protein metabolism, not elsewhere classified: Secondary | ICD-10-CM | POA: Diagnosis not present

## 2022-09-17 DIAGNOSIS — Z681 Body mass index (BMI) 19 or less, adult: Secondary | ICD-10-CM | POA: Diagnosis not present

## 2022-09-17 DIAGNOSIS — R634 Abnormal weight loss: Secondary | ICD-10-CM | POA: Diagnosis not present

## 2022-09-17 DIAGNOSIS — I129 Hypertensive chronic kidney disease with stage 1 through stage 4 chronic kidney disease, or unspecified chronic kidney disease: Secondary | ICD-10-CM | POA: Diagnosis not present

## 2022-09-17 DIAGNOSIS — S0181XD Laceration without foreign body of other part of head, subsequent encounter: Secondary | ICD-10-CM | POA: Diagnosis not present

## 2022-09-17 DIAGNOSIS — E785 Hyperlipidemia, unspecified: Secondary | ICD-10-CM | POA: Diagnosis not present

## 2022-09-17 DIAGNOSIS — N183 Chronic kidney disease, stage 3 unspecified: Secondary | ICD-10-CM | POA: Diagnosis not present

## 2022-10-17 DEATH — deceased
